# Patient Record
Sex: Female | Born: 1950 | Race: White | Hispanic: No | Marital: Married | Smoking: Never smoker
Health system: Southern US, Community
[De-identification: ages and names within clinical notes are randomized; demographics above are authoritative.]

## PROBLEM LIST (undated history)

## (undated) DIAGNOSIS — K5790 Diverticulosis of intestine, part unspecified, without perforation or abscess without bleeding: Secondary | ICD-10-CM

## (undated) DIAGNOSIS — Z809 Family history of malignant neoplasm, unspecified: Secondary | ICD-10-CM

## (undated) DIAGNOSIS — M8580 Other specified disorders of bone density and structure, unspecified site: Secondary | ICD-10-CM

## (undated) DIAGNOSIS — K862 Cyst of pancreas: Secondary | ICD-10-CM

## (undated) DIAGNOSIS — E119 Type 2 diabetes mellitus without complications: Secondary | ICD-10-CM

## (undated) DIAGNOSIS — I10 Essential (primary) hypertension: Secondary | ICD-10-CM

## (undated) DIAGNOSIS — C569 Malignant neoplasm of unspecified ovary: Secondary | ICD-10-CM

## (undated) HISTORY — DX: Family history of malignant neoplasm, unspecified: Z80.9

## (undated) HISTORY — DX: Diverticulosis of intestine, part unspecified, without perforation or abscess without bleeding: K57.90

## (undated) HISTORY — DX: Cyst of pancreas: K86.2

## (undated) HISTORY — DX: Other specified disorders of bone density and structure, unspecified site: M85.80

## (undated) HISTORY — DX: Malignant neoplasm of unspecified ovary: C56.9

---

## 2001-08-07 ENCOUNTER — Ambulatory Visit (HOSPITAL_COMMUNITY): Admission: RE | Admit: 2001-08-07 | Discharge: 2001-08-07 | Payer: Self-pay | Admitting: Unknown Physician Specialty

## 2001-08-07 ENCOUNTER — Encounter: Payer: Self-pay | Admitting: Unknown Physician Specialty

## 2002-08-13 ENCOUNTER — Ambulatory Visit (HOSPITAL_COMMUNITY): Admission: RE | Admit: 2002-08-13 | Discharge: 2002-08-13 | Payer: Self-pay | Admitting: Internal Medicine

## 2002-08-13 ENCOUNTER — Encounter: Payer: Self-pay | Admitting: Internal Medicine

## 2003-05-04 ENCOUNTER — Ambulatory Visit (HOSPITAL_COMMUNITY): Admission: RE | Admit: 2003-05-04 | Discharge: 2003-05-04 | Payer: Self-pay | Admitting: Internal Medicine

## 2003-07-07 ENCOUNTER — Emergency Department (HOSPITAL_COMMUNITY): Admission: EM | Admit: 2003-07-07 | Discharge: 2003-07-07 | Payer: Self-pay | Admitting: Emergency Medicine

## 2003-08-17 ENCOUNTER — Ambulatory Visit (HOSPITAL_COMMUNITY): Admission: RE | Admit: 2003-08-17 | Discharge: 2003-08-17 | Payer: Self-pay | Admitting: Internal Medicine

## 2003-09-25 ENCOUNTER — Ambulatory Visit (HOSPITAL_COMMUNITY): Admission: RE | Admit: 2003-09-25 | Discharge: 2003-09-25 | Payer: Self-pay | Admitting: Internal Medicine

## 2004-08-19 ENCOUNTER — Ambulatory Visit (HOSPITAL_COMMUNITY): Admission: RE | Admit: 2004-08-19 | Discharge: 2004-08-19 | Payer: Self-pay | Admitting: Unknown Physician Specialty

## 2005-08-21 ENCOUNTER — Ambulatory Visit (HOSPITAL_COMMUNITY): Admission: RE | Admit: 2005-08-21 | Discharge: 2005-08-21 | Payer: Self-pay | Admitting: Internal Medicine

## 2005-09-13 ENCOUNTER — Ambulatory Visit (HOSPITAL_COMMUNITY): Admission: RE | Admit: 2005-09-13 | Discharge: 2005-09-13 | Payer: Self-pay | Admitting: Internal Medicine

## 2006-02-20 ENCOUNTER — Ambulatory Visit (HOSPITAL_COMMUNITY): Admission: RE | Admit: 2006-02-20 | Discharge: 2006-02-20 | Payer: Self-pay | Admitting: Family Medicine

## 2006-09-28 ENCOUNTER — Ambulatory Visit (HOSPITAL_COMMUNITY): Admission: RE | Admit: 2006-09-28 | Discharge: 2006-09-28 | Payer: Self-pay | Admitting: Unknown Physician Specialty

## 2007-10-01 ENCOUNTER — Ambulatory Visit (HOSPITAL_COMMUNITY): Admission: RE | Admit: 2007-10-01 | Discharge: 2007-10-01 | Payer: Self-pay | Admitting: Unknown Physician Specialty

## 2008-10-02 ENCOUNTER — Ambulatory Visit (HOSPITAL_COMMUNITY): Admission: RE | Admit: 2008-10-02 | Discharge: 2008-10-02 | Payer: Self-pay | Admitting: Internal Medicine

## 2009-04-24 HISTORY — PX: VAGINAL HYSTERECTOMY: SHX2639

## 2009-10-04 ENCOUNTER — Ambulatory Visit (HOSPITAL_COMMUNITY): Admission: RE | Admit: 2009-10-04 | Discharge: 2009-10-04 | Payer: Self-pay | Admitting: Unknown Physician Specialty

## 2010-04-13 ENCOUNTER — Emergency Department (HOSPITAL_COMMUNITY)
Admission: EM | Admit: 2010-04-13 | Discharge: 2010-04-13 | Payer: Self-pay | Source: Home / Self Care | Admitting: Emergency Medicine

## 2010-07-04 LAB — BASIC METABOLIC PANEL
BUN: 14 mg/dL (ref 6–23)
CO2: 28 mEq/L (ref 19–32)
Chloride: 104 mEq/L (ref 96–112)
Creatinine, Ser: 0.96 mg/dL (ref 0.4–1.2)
GFR calc Af Amer: >60 mL/min (ref >60)
Glucose, Bld: 158 mg/dL — ABNORMAL HIGH (ref 70–99)
Potassium: 4 mEq/L (ref 3.5–5.1)

## 2010-09-09 NOTE — Consult Note (Signed)
NAME:  Sabrina Vang, Sabrina Vang                           ACCOUNT NO.:  [id number]   MEDICAL RECORD NO.:  [id number]                   PATIENT TYPE:   LOCATION:                                       FACILITY:  [facility]   PHYSICIAN:  Lionel December, M.D.                 DATE OF BIRTH:  1950-09-25   DATE OF CONSULTATION:  03/23/2003  DATE OF DISCHARGE:                                   CONSULTATION   REFERRING PHYSICIAN:  Dr. Ruthy Dick.   PRESENTING COMPLAINT:  Change in her bowel habits.   HISTORY OF PRESENT ILLNESS:  Sabrina Vang is a 60 year old Caucasian female who is  referred through the courtesy of Dr. Mora Appl for a possible colonoscopy.  She  was recently seen by Dr. Mora Appl for a gynecological followup and the patient  complained of having noted a change in her bowel habits.  She noted instead  of having one to two bowel movements a day, which was normal, she was having  dry hard stools.  This occurred soon after she started taking calcium.  On  stopping calcium, she has noted improvement, although she is not back to her  baseline.  Given her age, Dr. Mora Appl felt that she should have colonoscopy.  She denies abdominal pain, melena or rectal bleeding.  She has a good  appetite and has not lost any weight recently.  Her hemoglobin and  hematocrit two months ago were within normal limits.  A review of systems is  negative for heartburn, dysphagia, nausea or vomiting.   CURRENT MEDICATIONS:  She is presently on:  1. Atenolol 25 mg daily.  2. Triamterene/hydrochlorothiazide 37.5/25 mg daily.  3. MVI daily.   PAST MEDICAL HISTORY:  She has been hypertensive for about two years.  She  has a history of alimentary hypoglycemia treated with dietary measures.   PAST SURGICAL HISTORY:  She has never had any surgeries.   FAMILY HISTORY:  Father died of myocardial infarction at age 83.  She has  three sisters; one has diabetes and fibromyalgia; the other two sisters and  brother are in good health.   SOCIAL HISTORY:  She is married.  She has two grownup children.  They are  both in good health but their biological father has been diagnosed with  Huntington's chorea.  She is working at the [employer] of [place].  She has never smoked cigarettes and drinks alcohol socially but not  every day.   PHYSICAL EXAM:  GENERAL:  A pleasant, well-developed, well-nourished  Caucasian female who is in no acute distress.  She weighs 176 pounds.  She  is 5 feet 4-1/2 inches tall.  VITAL SIGNS:  Pulse 74 per minute, blood pressure 120/80, temperature is  97.3.  HEENT:  Conjunctivae are pink.  Sclerae are nonicteric.  Oropharyngeal  mucosa is normal.  NECK:  Neck without masses or thyromegaly.  CARDIAC:  Regular rhythm.  Normal S1 and S3.  No murmur or gallop noted.  LUNGS:  Lungs are clear to auscultation.  ABDOMEN:  Abdomen is full.  Bowel sounds are normal.  Palpation reveals a  soft abdomen without tenderness, organomegaly or masses.  RECTAL:  Examination deferred.  EXTREMITIES:  No clubbing or edema noted.   ASSESSMENT:  Sabrina Vang is a 60 year old Caucasian female who recently developed  constipation felt to be secondary to use of calcium.  She has improved off  calcium.  She does not have any worrisome or alarm symptoms, but given her  age, colonoscopy would be appropriate, primarily for screening purposes.  Once her colon exam is determined to be normal, we could recommend high-  fiber diet and Colace, along with her calcium and go from there.   RECOMMENDATIONS:  Total colonoscopy to be performed at [facility] in the near  future.  I have reviewed the procedure and risks with [name] and she is  agreeable.   I would like to thank Dr. Mora Appl for the opportunity to participate in the  care of this nice lady.      ___________________________________________                                            Lionel December, M.D.   NR/MEDQ  D:  03/23/2003  T:  03/24/2003  Job:  [id number]    cc:   Ruthy Dick  [address]  [place]  [place] [address]  Fax: [phone]   Madelin Rear. Sherwood Gambler, M.D.  [address]  [place]  [place] [address]  Fax: [phone]   [facility]

## 2010-09-09 NOTE — Op Note (Signed)
NAME:  Sabrina Vang, Sabrina Vang                            ACCOUNT NO.:  [id number]   MEDICAL RECORD NO.:  [id number]                   PATIENT TYPE:  AMB   LOCATION:  DAY                                  FACILITY:  [facility]   PHYSICIAN:  Lionel December, M.D.                 DATE OF BIRTH:  June 29, 1950   DATE OF PROCEDURE:  05/04/2003  DATE OF DISCHARGE:                                 OPERATIVE REPORT   PROCEDURE:  Total colonoscopy.   INDICATIONS FOR PROCEDURE:  Sabrina Vang is a 60 year old Caucasian female who has  noted changes in her bowel habits recently, possibly due to calcium use.  She is undergoing diagnostic colonoscopy.  Family history is negative for  colorectal carcinoma.  The procedure and risks were reviewed with the  patient, and informed consent was obtained.   PREOPERATIVE MEDICATIONS:  Demerol 25 mg IV, Versed 6 mg IV in divided dose.   FINDINGS:  The procedure was performed in the endoscopy suite.  The  patient's vital signs and O2 saturations were monitored during the procedure  and remained stable.  The patient was placed in the left lateral recumbent  position and rectal examination performed.  No abnormality noted on external  or digital exam.  The Olympus videoscope was placed into the rectum and  advanced into the region of the sigmoid colon which was spastic with  scattered diverticula.  Once the scope was passed through this segment,  further intubation to the cecum was easy.  The cecum was identified by the  ileocecal valve and appendiceal orifice.  There was an erosion at the  ileocecal valve felt to be a nonspecific finding.  As the scope was  withdrawn, the colonic mucosa was carefully examined.  There was a small  polyp at the ascending colon which was ablated by cold biopsy.  The rest of  the colon was normal.  The rectal mucosa was normal.  The scope was  retroflexed to examine the anorectal junction which was unremarkable.  The  endoscope was straightened and withdrawn.   The patient tolerated the  procedure well.   FINAL DIAGNOSES:  1. Examination performed to the cecum.  2. Sigmoid colon diverticulosis.  3. Small polyp ablated by cold biopsy from the ascending colon.   RECOMMENDATIONS:  1. High fiber diet.  2. Citrucel or equivalent, one tablespoon full daily.  3. I will be contacting the patient with the biopsy results and further     recommendations if any.      ___________________________________________                                            Lionel December, M.D.   NR/MEDQ  D:  05/04/2003  T:  05/04/2003  Job:  [id number]   cc:  Ruthy Dick  [address]  [place]  [place] [address]  Fax: [phone]   Madelin Rear. Sherwood Gambler, M.D.  [address]  [place]  [place] [address]  Fax: [phone]

## 2010-09-26 ENCOUNTER — Other Ambulatory Visit (HOSPITAL_COMMUNITY): Payer: Self-pay | Admitting: *Deleted

## 2010-09-26 ENCOUNTER — Other Ambulatory Visit (HOSPITAL_COMMUNITY): Payer: Self-pay | Admitting: Unknown Physician Specialty

## 2010-09-26 DIAGNOSIS — Z139 Encounter for screening, unspecified: Secondary | ICD-10-CM

## 2010-10-11 ENCOUNTER — Ambulatory Visit (HOSPITAL_COMMUNITY)
Admission: RE | Admit: 2010-10-11 | Discharge: 2010-10-11 | Disposition: A | Discharge status: Home / Self Care | Payer: 59 | Source: Ambulatory Visit | Attending: Internal Medicine | Admitting: Internal Medicine

## 2010-10-11 DIAGNOSIS — Z139 Encounter for screening, unspecified: Secondary | ICD-10-CM

## 2010-10-11 DIAGNOSIS — Z1231 Encounter for screening mammogram for malignant neoplasm of breast: Secondary | ICD-10-CM | POA: Insufficient documentation

## 2011-09-06 ENCOUNTER — Other Ambulatory Visit (HOSPITAL_COMMUNITY): Payer: Self-pay | Admitting: Internal Medicine

## 2011-09-06 DIAGNOSIS — Z139 Encounter for screening, unspecified: Secondary | ICD-10-CM

## 2011-10-12 ENCOUNTER — Ambulatory Visit (HOSPITAL_COMMUNITY)
Admission: RE | Admit: 2011-10-12 | Discharge: 2011-10-12 | Disposition: A | Discharge status: Home / Self Care | Payer: BC Managed Care – PPO | Source: Ambulatory Visit | Attending: Internal Medicine | Admitting: Internal Medicine

## 2011-10-12 DIAGNOSIS — Z139 Encounter for screening, unspecified: Secondary | ICD-10-CM

## 2011-10-12 DIAGNOSIS — Z1231 Encounter for screening mammogram for malignant neoplasm of breast: Secondary | ICD-10-CM | POA: Insufficient documentation

## 2012-09-02 ENCOUNTER — Other Ambulatory Visit (HOSPITAL_COMMUNITY): Payer: Self-pay | Admitting: Internal Medicine

## 2012-09-02 DIAGNOSIS — Z139 Encounter for screening, unspecified: Secondary | ICD-10-CM

## 2012-10-14 ENCOUNTER — Ambulatory Visit (HOSPITAL_COMMUNITY)
Admission: RE | Admit: 2012-10-14 | Discharge: 2012-10-14 | Disposition: A | Discharge status: Home / Self Care | Payer: BC Managed Care – PPO | Source: Ambulatory Visit | Attending: Internal Medicine | Admitting: Internal Medicine

## 2012-10-14 DIAGNOSIS — Z139 Encounter for screening, unspecified: Secondary | ICD-10-CM

## 2012-10-14 DIAGNOSIS — Z1231 Encounter for screening mammogram for malignant neoplasm of breast: Secondary | ICD-10-CM | POA: Insufficient documentation

## 2013-04-24 DIAGNOSIS — E119 Type 2 diabetes mellitus without complications: Secondary | ICD-10-CM

## 2013-04-24 HISTORY — DX: Type 2 diabetes mellitus without complications: E11.9

## 2013-04-29 ENCOUNTER — Encounter (INDEPENDENT_AMBULATORY_CARE_PROVIDER_SITE_OTHER): Payer: Self-pay | Admitting: *Deleted

## 2013-05-21 ENCOUNTER — Other Ambulatory Visit (INDEPENDENT_AMBULATORY_CARE_PROVIDER_SITE_OTHER): Payer: Self-pay | Admitting: *Deleted

## 2013-05-21 DIAGNOSIS — Z1211 Encounter for screening for malignant neoplasm of colon: Secondary | ICD-10-CM

## 2013-05-22 ENCOUNTER — Telehealth (INDEPENDENT_AMBULATORY_CARE_PROVIDER_SITE_OTHER): Payer: Self-pay | Admitting: *Deleted

## 2013-05-22 DIAGNOSIS — Z1211 Encounter for screening for malignant neoplasm of colon: Secondary | ICD-10-CM

## 2013-05-22 MED ORDER — PEG-KCL-NACL-NASULF-NA ASC-C 100 G PO SOLR: 1.0000 | Freq: Once | ORAL | Status: DC

## 2013-05-22 NOTE — Telephone Encounter (Signed)
Patient needs movi prep 

## 2013-06-04 ENCOUNTER — Telehealth (INDEPENDENT_AMBULATORY_CARE_PROVIDER_SITE_OTHER): Payer: Self-pay | Admitting: *Deleted

## 2013-06-04 NOTE — Telephone Encounter (Signed)
  Procedure: tcs  Reason/Indication:  screening  Has patient had this procedure before?  Yes, 2005 -- epic  If so, when, by whom and where?    Is there a family history of colon cancer?  no  Who?  What age when diagnosed?    Is patient diabetic?   yes      Does patient have prosthetic heart valve?  no  Do you have a pacemaker?  no  Has patient ever had endocarditis? no  Has patient had joint replacement within last 12 months?  no  Does patient tend to be constipated or take laxatives? no  Is patient on Coumadin, Plavix and/or Aspirin? yes  Medications: asa 81 mg daily, metformin 500 mg bid (am & pm), atenolol 25 mg daily, mutli vit  Allergies: nkda  Medication Adjustment: asa 2 days, hold metformin evening before and morning of  Procedure date & time: 06/26/13 at 1030

## 2013-06-05 NOTE — Telephone Encounter (Signed)
agree

## 2013-06-09 ENCOUNTER — Encounter (HOSPITAL_COMMUNITY): Payer: Self-pay | Admitting: Pharmacy Technician

## 2013-06-26 ENCOUNTER — Encounter (HOSPITAL_COMMUNITY)
Admission: RE | Disposition: A | Discharge status: Home / Self Care | Payer: Self-pay | Source: Ambulatory Visit | Attending: Internal Medicine

## 2013-06-26 ENCOUNTER — Ambulatory Visit (HOSPITAL_COMMUNITY)
Admission: RE | Admit: 2013-06-26 | Discharge: 2013-06-26 | Disposition: A | Discharge status: Home / Self Care | Payer: BC Managed Care – PPO | Source: Ambulatory Visit | Attending: Internal Medicine | Admitting: Internal Medicine

## 2013-06-26 ENCOUNTER — Encounter (HOSPITAL_COMMUNITY): Payer: Self-pay | Admitting: *Deleted

## 2013-06-26 DIAGNOSIS — Z7982 Long term (current) use of aspirin: Secondary | ICD-10-CM | POA: Insufficient documentation

## 2013-06-26 DIAGNOSIS — K5730 Diverticulosis of large intestine without perforation or abscess without bleeding: Secondary | ICD-10-CM | POA: Insufficient documentation

## 2013-06-26 DIAGNOSIS — E119 Type 2 diabetes mellitus without complications: Secondary | ICD-10-CM | POA: Insufficient documentation

## 2013-06-26 DIAGNOSIS — I10 Essential (primary) hypertension: Secondary | ICD-10-CM | POA: Insufficient documentation

## 2013-06-26 DIAGNOSIS — Z1211 Encounter for screening for malignant neoplasm of colon: Secondary | ICD-10-CM

## 2013-06-26 HISTORY — DX: Type 2 diabetes mellitus without complications: E11.9

## 2013-06-26 HISTORY — PX: COLONOSCOPY: SHX5424

## 2013-06-26 HISTORY — DX: Essential (primary) hypertension: I10

## 2013-06-26 LAB — GLUCOSE, CAPILLARY: Glucose-Capillary: 121 mg/dL — ABNORMAL HIGH (ref 70–99)

## 2013-06-26 SURGERY — COLONOSCOPY
Anesthesia: Moderate Sedation

## 2013-06-26 MED ORDER — STERILE WATER FOR IRRIGATION IR SOLN
Status: DC | PRN
  Administered 2013-06-26: 11:00:00

## 2013-06-26 MED ORDER — MIDAZOLAM HCL 5 MG/5ML IJ SOLN
INTRAMUSCULAR | Status: DC | PRN
  Administered 2013-06-26 (×3): 2 mg via INTRAVENOUS

## 2013-06-26 MED ORDER — MEPERIDINE HCL 50 MG/ML IJ SOLN
INTRAMUSCULAR | Status: AC
  Filled 2013-06-26: qty 1

## 2013-06-26 MED ORDER — SODIUM CHLORIDE 0.9 % IV SOLN
INTRAVENOUS | Status: DC
  Administered 2013-06-26: 10:00:00 via INTRAVENOUS

## 2013-06-26 MED ORDER — MIDAZOLAM HCL 5 MG/5ML IJ SOLN
INTRAMUSCULAR | Status: AC
  Filled 2013-06-26: qty 10

## 2013-06-26 MED ORDER — MEPERIDINE HCL 50 MG/ML IJ SOLN
INTRAMUSCULAR | Status: DC | PRN
  Administered 2013-06-26 (×2): 25 mg via INTRAVENOUS

## 2013-06-26 NOTE — Discharge Instructions (Signed)
Resume usual medications and high fiber diet. No driving for 24 hours. Next screening exam in 10 years.     Diverticulosis Diverticulosis is a common condition that develops when small pouches (diverticula) form in the wall of the colon. The risk of diverticulosis increases with age. It happens more often in people who eat a low-fiber diet. Most individuals with diverticulosis have no symptoms. Those individuals with symptoms usually experience abdominal pain, constipation, or loose stools (diarrhea). HOME CARE INSTRUCTIONS   Increase the amount of fiber in your diet as directed by your caregiver or dietician. This may reduce symptoms of diverticulosis.  Your caregiver may recommend taking a dietary fiber supplement.  Drink at least 6 to 8 glasses of water each day to prevent constipation.  Try not to strain when you have a bowel movement.  Your caregiver may recommend avoiding nuts and seeds to prevent complications, although this is still an uncertain benefit.  Only take over-the-counter or prescription medicines for pain, discomfort, or fever as directed by your caregiver. FOODS WITH HIGH FIBER CONTENT INCLUDE:  Fruits. Apple, peach, pear, tangerine, raisins, prunes.  Vegetables. Brussels sprouts, asparagus, broccoli, cabbage, carrot, cauliflower, romaine lettuce, spinach, summer squash, tomato, winter squash, zucchini.  Starchy Vegetables. Baked beans, kidney beans, lima beans, split peas, lentils, potatoes (with skin).  Grains. Whole wheat bread, brown rice, bran flake cereal, plain oatmeal, white rice, shredded wheat, bran muffins. SEEK IMMEDIATE MEDICAL CARE IF:   You develop increasing pain or severe bloating.  You have an oral temperature above 102 F (38.9 C), not controlled by medicine.  You develop vomiting or bowel movements that are bloody or black. Document Released: 01/06/2004 Document Revised: 07/03/2011 Document Reviewed: 09/08/2009 [facility] Patient  Information 2014 [place].

## 2013-06-26 NOTE — H&P (Signed)
Sabrina Vang is an 63 y.o. female.   Chief Complaint: Patient is  here for colonoscopy. HPI: Patient is 22-year-old Caucasian female who is here for screening colonoscopy. She denies abdominal pain change in bowel habits or rectal bleeding. Patient's last colonoscopy was 10 years ago. Family history is negative for CRC.  Past Medical History  Diagnosis Date  . Diabetes mellitus without complication   . Hypertension     Past Surgical History  Procedure Laterality Date  . Abdominal hysterectomy      Family History  Problem Relation Age of Onset  . Colon cancer Neg Hx    Social History:  reports that she has never smoked. She does not have any smokeless tobacco history on file. She reports that she drinks about 2.5 ounces of alcohol per week. She reports that she does not use illicit drugs.  Allergies: No Known Allergies  Medications Prior to Admission  Medication Sig Dispense Refill  . aspirin EC 81 MG tablet Take 81 mg by mouth daily.      Marland Kitchen atenolol (TENORMIN) 25 MG tablet Take 25 mg by mouth daily.      . metFORMIN (GLUCOPHAGE) 500 MG tablet Take 500 mg by mouth 2 (two) times daily with a meal.      . Multiple Vitamin (MULTIVITAMIN WITH MINERALS) TABS tablet Take 1 tablet by mouth daily.      . naproxen sodium (ALEVE) 220 MG tablet Take 220 mg by mouth daily as needed (for pain).      . peg 3350 powder (MOVIPREP) 100 G SOLR Take 1 kit (200 g total) by mouth once.  1 kit  0    Results for orders placed during the hospital encounter of 06/26/13 (from the past 48 hour(s))  GLUCOSE, CAPILLARY     Status: Abnormal   Collection Time    06/26/13 10:01 AM      Result Value Ref Range   Glucose-Capillary 121 (*) 70 - 99 mg/dL   No results found.  ROS  Blood pressure 138/78, pulse 84, temperature 98 F (36.7 C), temperature source Oral, height 5' 5"  (1.651 m), weight 155 lb (70.308 kg), SpO2 95.00%. Physical Exam  Constitutional: She appears well-developed and well-nourished.   HENT:  Mouth/Throat: Oropharynx is clear and moist.  Eyes: Conjunctivae are normal. No scleral icterus.  Neck: No thyromegaly present.  Cardiovascular: Normal rate, regular rhythm and normal heart sounds.   No murmur heard. GI: Soft. She exhibits no distension and no mass. There is no tenderness.  Musculoskeletal: She exhibits no edema.  Lymphadenopathy:    She has no cervical adenopathy.  Neurological: She is alert.  Skin: Skin is warm and dry.     Assessment/Plan Average risk screening colonoscopy.  REHMAN,NAJEEB U 06/26/2013, 10:46 AM

## 2013-06-26 NOTE — Op Note (Signed)
COLONOSCOPY PROCEDURE REPORT  PATIENT:  Sabrina Vang  MR#:  [id number] Birthdate:  Oct 05, 1950, 64 y.o., female Endoscopist:  Dr. Rogene Houston, MD Referred By:  Dr. Sherrilee Gilles. Gerarda Fraction, MD Procedure Date: 06/26/2013  Procedure:   Colonoscopy  Indications:  Patient is 63 year old Caucasian female is undergoing average risk screening colonoscopy.  Informed Consent:  The procedure and risks were reviewed with the patient and informed consent was obtained.  Medications:  Demerol 50 mg IV Versed 6 mg IV  Description of procedure:  After a digital rectal exam was performed, that colonoscope was advanced from the anus through the rectum and colon to the area of the cecum, ileocecal valve and appendiceal orifice. The cecum was deeply intubated. These structures were well-seen and photographed for the record. From the level of the cecum and ileocecal valve, the scope was slowly and cautiously withdrawn. The mucosal surfaces were carefully surveyed utilizing scope tip to flexion to facilitate fold flattening as needed. The scope was pulled down into the rectum where a thorough exam including retroflexion was performed.  Findings:  Prep satisfactory. Two small diverticula noted at hepatic flexure. Scattered diverticula at sigmoid colon. Normal mucosa of rectum and anorectal junction.   Therapeutic/Diagnostic Maneuvers Performed:  None  Complications:  None  Cecal Withdrawal Time:  9 minutes  Impression:  Examination performed to cecum. Mild sigmoid colon diverticulosis along with two diverticula at hepatic flexure.   Recommendations:  Standard instructions given. High-fiber diet. Next screening exam in 10 years.  Jaeceon Michelin U  06/26/2013 11:19 AM  CC: Dr. Glo Herring., MD & Dr. Rayne Du ref. provider found

## 2013-06-27 ENCOUNTER — Encounter (HOSPITAL_COMMUNITY): Payer: Self-pay | Admitting: Internal Medicine

## 2013-09-10 ENCOUNTER — Other Ambulatory Visit (HOSPITAL_COMMUNITY): Payer: Self-pay | Admitting: Internal Medicine

## 2013-09-10 DIAGNOSIS — Z1231 Encounter for screening mammogram for malignant neoplasm of breast: Secondary | ICD-10-CM

## 2013-10-16 ENCOUNTER — Ambulatory Visit (HOSPITAL_COMMUNITY)
Admission: RE | Admit: 2013-10-16 | Discharge: 2013-10-16 | Disposition: A | Discharge status: Home / Self Care | Payer: BC Managed Care – PPO | Source: Ambulatory Visit | Attending: Internal Medicine | Admitting: Internal Medicine

## 2013-10-16 DIAGNOSIS — Z1231 Encounter for screening mammogram for malignant neoplasm of breast: Secondary | ICD-10-CM

## 2014-06-03 ENCOUNTER — Encounter (INDEPENDENT_AMBULATORY_CARE_PROVIDER_SITE_OTHER): Payer: Self-pay | Admitting: Internal Medicine

## 2014-06-03 ENCOUNTER — Encounter (HOSPITAL_COMMUNITY): Payer: Self-pay | Admitting: Emergency Medicine

## 2014-06-03 ENCOUNTER — Encounter (INDEPENDENT_AMBULATORY_CARE_PROVIDER_SITE_OTHER): Payer: Self-pay | Admitting: *Deleted

## 2014-06-03 ENCOUNTER — Emergency Department (HOSPITAL_COMMUNITY): Payer: BLUE CROSS/BLUE SHIELD

## 2014-06-03 ENCOUNTER — Ambulatory Visit (INDEPENDENT_AMBULATORY_CARE_PROVIDER_SITE_OTHER): Payer: BLUE CROSS/BLUE SHIELD | Admitting: Internal Medicine

## 2014-06-03 ENCOUNTER — Emergency Department (HOSPITAL_COMMUNITY)
Admission: EM | Admit: 2014-06-03 | Discharge: 2014-06-03 | Disposition: A | Discharge status: Home / Self Care | Payer: BLUE CROSS/BLUE SHIELD | Attending: Emergency Medicine | Admitting: Emergency Medicine

## 2014-06-03 VITALS — BP 102/58 | HR 84 | Temp 98.3°F | Ht 65.0 in | Wt 159.4 lb

## 2014-06-03 DIAGNOSIS — E119 Type 2 diabetes mellitus without complications: Secondary | ICD-10-CM | POA: Insufficient documentation

## 2014-06-03 DIAGNOSIS — Z791 Long term (current) use of non-steroidal anti-inflammatories (NSAID): Secondary | ICD-10-CM | POA: Insufficient documentation

## 2014-06-03 DIAGNOSIS — R51 Headache: Secondary | ICD-10-CM | POA: Diagnosis not present

## 2014-06-03 DIAGNOSIS — R17 Unspecified jaundice: Secondary | ICD-10-CM | POA: Diagnosis not present

## 2014-06-03 DIAGNOSIS — R21 Rash and other nonspecific skin eruption: Secondary | ICD-10-CM | POA: Insufficient documentation

## 2014-06-03 DIAGNOSIS — R319 Hematuria, unspecified: Secondary | ICD-10-CM | POA: Diagnosis present

## 2014-06-03 DIAGNOSIS — R1013 Epigastric pain: Secondary | ICD-10-CM

## 2014-06-03 DIAGNOSIS — I10 Essential (primary) hypertension: Secondary | ICD-10-CM | POA: Diagnosis not present

## 2014-06-03 DIAGNOSIS — Z79899 Other long term (current) drug therapy: Secondary | ICD-10-CM | POA: Insufficient documentation

## 2014-06-03 DIAGNOSIS — M791 Myalgia: Secondary | ICD-10-CM | POA: Diagnosis not present

## 2014-06-03 DIAGNOSIS — Z7982 Long term (current) use of aspirin: Secondary | ICD-10-CM | POA: Diagnosis not present

## 2014-06-03 LAB — URINALYSIS, ROUTINE W REFLEX MICROSCOPIC
GLUCOSE, UA: NEGATIVE mg/dL
Nitrite: NEGATIVE
Specific Gravity, Urine: 1.025 (ref 1.005–1.030)
Urobilinogen, UA: 0.2 mg/dL (ref 0.0–1.0)
pH: 6 (ref 5.0–8.0)

## 2014-06-03 LAB — CBC WITH DIFFERENTIAL/PLATELET
BASOS PCT: 1 % (ref 0–1)
Basophils Absolute: 0.1 10*3/uL (ref 0.0–0.1)
Eosinophils Absolute: 0.9 10*3/uL — ABNORMAL HIGH (ref 0.0–0.7)
Eosinophils Relative: 10 % — ABNORMAL HIGH (ref 0–5)
HCT: 41.7 % (ref 36.0–46.0)
HEMOGLOBIN: 13.6 g/dL (ref 12.0–15.0)
LYMPHS ABS: 1.4 10*3/uL (ref 0.7–4.0)
Lymphocytes Relative: 17 % (ref 12–46)
MCH: 31 pg (ref 26.0–34.0)
MCHC: 32.6 g/dL (ref 30.0–36.0)
MCV: 95 fL (ref 78.0–100.0)
MONOS PCT: 8 % (ref 3–12)
Monocytes Absolute: 0.7 10*3/uL (ref 0.1–1.0)
NEUTROS ABS: 5.2 10*3/uL (ref 1.7–7.7)
Neutrophils Relative %: 64 % (ref 43–77)
Platelets: 148 10*3/uL — ABNORMAL LOW (ref 150–400)
RBC: 4.39 MIL/uL (ref 3.87–5.11)
RDW: 13.4 % (ref 11.5–15.5)
WBC: 8.2 10*3/uL (ref 4.0–10.5)

## 2014-06-03 LAB — COMPREHENSIVE METABOLIC PANEL
ALBUMIN: 3.6 g/dL (ref 3.5–5.2)
ALT: 304 U/L — ABNORMAL HIGH (ref 0–35)
AST: 166 U/L — ABNORMAL HIGH (ref 0–37)
Alkaline Phosphatase: 90 U/L (ref 39–117)
Anion gap: 7 (ref 5–15)
BUN: 11 mg/dL (ref 6–23)
CALCIUM: 8.9 mg/dL (ref 8.4–10.5)
CO2: 24 mmol/L (ref 19–32)
CREATININE: 0.75 mg/dL (ref 0.50–1.10)
Chloride: 109 mmol/L (ref 96–112)
GFR calc non Af Amer: 88 mL/min — ABNORMAL LOW (ref >90)
Glucose, Bld: 133 mg/dL — ABNORMAL HIGH (ref 70–99)
Potassium: 3.6 mmol/L (ref 3.5–5.1)
Sodium: 140 mmol/L (ref 135–145)
Total Bilirubin: 2.1 mg/dL — ABNORMAL HIGH (ref 0.3–1.2)
Total Protein: 7.5 g/dL (ref 6.0–8.3)

## 2014-06-03 LAB — TROPONIN I: Troponin I: <0.03 ng/mL (ref <0.031)

## 2014-06-03 LAB — URINE MICROSCOPIC-ADD ON

## 2014-06-03 LAB — LIPASE, BLOOD: LIPASE: 22 U/L (ref 11–59)

## 2014-06-03 MED ORDER — HYDROCODONE-ACETAMINOPHEN 5-325 MG PO TABS: 1.0000 | ORAL_TABLET | Freq: Four times a day (QID) | ORAL | Status: DC | PRN

## 2014-06-03 NOTE — ED Notes (Signed)
MD at the bedside to discuss plan with pt

## 2014-06-03 NOTE — Discharge Instructions (Signed)
Jaundice  Jaundice is when the skin, whites of the eyes, and mucous membranes turn a yellowish color. It is caused by high levels of bilirubin in the blood. Bilirubin is produced by the normal breakdown of red blood cells. Jaundice may mean the liver or bile system in your body is not working right. HOME CARE  Rest.  Drink enough fluids to keep your pee (urine) clear or pale yellow.  Do not drink alcohol.  Only take medicine as told by your doctor.  If you have jaundice because of viral hepatitis or an infection:  Avoid close contact with people.  Avoid making food for others.  Avoid sharing eating utensils with others.  Wash your hands often.  Keep all follow-up visits with your doctor.  Use skin lotion to help with itching. GET HELP RIGHT AWAY IF:  You have more pain.  You keep throwing up (vomiting).  You lose too much body fluid (dehydration).  You have a fever or persistent symptoms for more than 72 hours.  You have a fever and your symptoms suddenly get worse.  You become weak or confused.  You develop a severe headache. MAKE SURE YOU:  Understand these instructions.  Will watch your condition.  Will get help right away if you are not doing well or get worse. Document Released: 05/13/2010 Document Revised: 07/03/2011 Document Reviewed: 05/13/2010 [facility] Patient Information 2015 [place], [place]. This information is not intended to replace advice given to you by your health care provider. Make sure you discuss any questions you have with your health care provider.  Follow-up with GI medicine or your primary care doctor to have your liver function tests rechecked. May require hepatitis screen. In addition may have a dysfunctional gallbladder and may require hida scan. Do not feel that the liver function test directly reflect a gallbladder problem however. Return for any new or worse symptoms.

## 2014-06-03 NOTE — ED Provider Notes (Signed)
CSN: [id number]     Arrival date & time 06/03/14  0732 History  This chart was scribed for Fredia Sorrow, MD by Edison Simon, ED Scribe. This patient was seen in room APA11/APA11 and the patient's care was started at 7:57 AM.    Chief Complaint  Patient presents with  . Hematuria   Patient is a 64 y.o. female presenting with hematuria and abdominal pain. The history is provided by the patient. No language interpreter was used.  Hematuria This is a new problem. The current episode started yesterday. The problem occurs constantly. The problem has not changed since onset.Associated symptoms include abdominal pain and headaches. Pertinent negatives include no chest pain and no shortness of breath. Nothing aggravates the symptoms. Nothing relieves the symptoms. She has tried nothing for the symptoms.  Abdominal Pain Pain location:  Epigastric Pain radiation: laterally across ribs. Pain severity:  Moderate Onset quality:  Sudden Timing:  Intermittent Progression:  Resolved Chronicity:  New Relieved by:  Vomiting Worsened by:  Nothing tried Ineffective treatments:  None tried Associated symptoms: chills, hematuria and vomiting   Associated symptoms: no chest pain, no cough, no diarrhea, no dysuria, no fever, no nausea, no shortness of breath and no sore throat     HPI Comments: TAKARI DUNCOMBE is a 64 y.o. female with history of HTN and DM who presents to the Emergency Department complaining of hematuria with onset yesterday. She states her urine is a "deep orange color" and has a foul odor. She notes that 4 days ago, she had epigastric pain radiating across ribcage which resolved. The pain recurred the next day and was more persistent, so she called EMS because they suspected she was having a cardiac problem; EMS did 2 EKGs without significant finding. She reports subsequent projectile vomiting, after which pain resolved, and persistent shaking lasting into the night. She thought she was  hypoglycemic so she ate orange juice and peanut butter sandwich. She reports body aches and headache the day after as well as some urine discoloration. She also reports associated decreased appetite, that food tastes "weird," yellow color to her skin, stool that is light in color, and itchy red streaks to her neck and upper abdomen. She denies nausea, diarrhea, or fever.  PCP: Glo Herring., MD but has not seen in 5 years, has to go through process to establish care again Gynecologist refills he blood pressure medication  Past Medical History  Diagnosis Date  . Diabetes mellitus without complication   . Hypertension    Past Surgical History  Procedure Laterality Date  . Abdominal hysterectomy    . Colonoscopy N/A 06/26/2013    Procedure: COLONOSCOPY;  Surgeon: Rogene Houston, MD;  Location: [facility];  Service: Endoscopy;  Laterality: N/A;  1030   Family History  Problem Relation Age of Onset  . Colon cancer Neg Hx    History  Substance Use Topics  . Smoking status: Never Smoker   . Smokeless tobacco: Not on file  . Alcohol Use: 2.5 oz/week    5 Standard drinks or equivalent per week     Comment: occ   OB History    No data available     Review of Systems  Constitutional: Positive for chills and appetite change. Negative for fever.  HENT: Negative for rhinorrhea and sore throat.   Eyes: Negative for visual disturbance.  Respiratory: Negative for cough and shortness of breath.   Cardiovascular: Negative for chest pain and leg swelling.  Gastrointestinal: Positive for  vomiting and abdominal pain. Negative for nausea and diarrhea.  Genitourinary: Positive for hematuria. Negative for dysuria and frequency.  Musculoskeletal: Positive for myalgias. Negative for back pain.  Skin: Positive for color change and rash.  Neurological: Positive for headaches.  Hematological: Does not bruise/bleed easily.  Psychiatric/Behavioral: Negative for confusion.      Allergies   Review of patient's allergies indicates no known allergies.  Home Medications   Prior to Admission medications   Medication Sig Start Date End Date Taking? Authorizing Provider  aspirin EC 81 MG tablet Take 81 mg by mouth daily.   Yes Historical Provider, MD  atenolol (TENORMIN) 25 MG tablet Take 25 mg by mouth at bedtime.    Yes Historical Provider, MD  metFORMIN (GLUCOPHAGE) 1000 MG tablet Take 500 mg by mouth 2 (two) times daily. 05/21/14  Yes Historical Provider, MD  Multiple Vitamin (MULTIVITAMIN WITH MINERALS) TABS tablet Take 1 tablet by mouth daily.   Yes Historical Provider, MD  naproxen sodium (ALEVE) 220 MG tablet Take 220 mg by mouth daily as needed (for pain).   Yes Historical Provider, MD   BP 134/75 mmHg  Pulse 76  Temp(Src) 97.8 F (36.6 C) (Oral)  Resp 14  Ht 5\' 5"  (1.651 m)  Wt 159 lb (72.122 kg)  BMI 26.46 kg/m2  SpO2 99% Physical Exam  Constitutional: She is oriented to person, place, and time. She appears well-developed and well-nourished.  HENT:  Head: Normocephalic and atraumatic.  Mucous membranes moist  Eyes: Conjunctivae and EOM are normal. Pupils are equal, round, and reactive to light.  Neck: Normal range of motion. Neck supple.  Cardiovascular: Normal rate, regular rhythm and normal heart sounds.   Pulmonary/Chest: Effort normal and breath sounds normal. No respiratory distress. She has no wheezes. She has no rales.  Lungs clear bilaterally  Abdominal: Soft. Bowel sounds are normal. There is no tenderness.  Musculoskeletal: Normal range of motion. She exhibits no edema (no swelling in ankles).  Neurological: She is alert and oriented to person, place, and time. No cranial nerve deficit. She exhibits normal muscle tone. Coordination normal.  Skin: Skin is warm and dry.  Psychiatric: She has a normal mood and affect.  Nursing note and vitals reviewed.   ED Course  Procedures (including critical care time)  DIAGNOSTIC STUDIES: Oxygen Saturation is  97% on room air, normal by my interpretation.    COORDINATION OF CARE: 8:13 AM Discussed treatment plan with patient at beside, the patient agrees with the plan and has no further questions at this time.   Labs Review Labs Reviewed  URINALYSIS, ROUTINE W REFLEX MICROSCOPIC - Abnormal; Notable for the following:    Hgb urine dipstick TRACE (*)    Bilirubin Urine MODERATE (*)    Ketones, ur TRACE (*)    Protein, ur TRACE (*)    Leukocytes, UA SMALL (*)    All other components within normal limits  URINE MICROSCOPIC-ADD ON - Abnormal; Notable for the following:    Squamous Epithelial / LPF MANY (*)    Bacteria, UA FEW (*)    Crystals CA OXALATE CRYSTALS (*)    All other components within normal limits  COMPREHENSIVE METABOLIC PANEL - Abnormal; Notable for the following:    Glucose, Bld 133 (*)    AST 166 (*)    ALT 304 (*)    Total Bilirubin 2.1 (*)    GFR calc non Af Amer 88 (*)    All other components within normal limits  CBC WITH  DIFFERENTIAL/PLATELET - Abnormal; Notable for the following:    Platelets 148 (*)    Eosinophils Relative 10 (*)    Eosinophils Absolute 0.9 (*)    All other components within normal limits  URINE CULTURE  LIPASE, BLOOD  TROPONIN I    Imaging Review US Abdomen Complete  06/03/2014   CLINICAL DATA:  Epigastric abdominal pain.  EXAM: ULTRASOUND ABDOMEN COMPLETE  COMPARISON:  None.  FINDINGS: Gallbladder: Mobile sludge noted within the gallbladder. Gallbladder wall slightly thickened at 4 mm. Negative sonographic Murphy's.  Common bile duct: Diameter: Normal caliber, 3 mm.  Liver: Increased echotexture throughout the liver compatible with fatty infiltration. Hypoechoic area noted posteriorly, likely focal fatty sparing.  IVC: No abnormality visualized.  Pancreas: Visualized portion unremarkable.  Spleen: Size and appearance within normal limits.  Right Kidney: Length: 2.3 cm benign appearing cyst in the lower pole. Right kidney measures 10.5 cm.  Echogenicity within normal limits. No mass or hydronephrosis visualized.  Left Kidney: Length: 12.1 cm. Echogenicity within normal limits. No mass or hydronephrosis visualized.  Abdominal aorta: No aneurysm.  Other findings: None.  IMPRESSION: Sludge within the gallbladder. Gallbladder wall is slightly thickened which could be related to chronic cholecystitis. No visible stones.  Fatty infiltration of the liver.   Electronically Signed   By: Rolm Baptise M.D.   On: 06/03/2014 10:11     EKG Interpretation None      Date: 06/03/2014  Rate: 67  Rhythm: normal sinus rhythm  QRS Axis: normal  Intervals: normal  ST/T Wave abnormalities: normal  Conduction Disutrbances:none  Narrative Interpretation:   Old EKG Reviewed: none available      MDM   Final diagnoses:  Jaundice    Patient's ultrasound raises some concern for thickening of the gallbladder and sludge without gallstones. Could represent a dysfunctional gallbladder. This could perhaps explain the history of pain in the epigastric area occasionally. In addition liver function tests do show elevation in the enzymes, alkaline phosphatase however is normal so not likely an obstructive pattern, bilirubin is elevated explains patient's feeling that her skin is yellow. Urinalysis negative for urinary tract infection most likely color changes related to the elevated bilirubin. Urine culture is sent for back up.  Patient made aware that follow-up with her regular doctor or GI medicine for recheck of the enzymes and possible hepatitis screen is important. In addition patient may need a hida scan to further evaluate the function of the gallbladder. Patient stable for discharge home.  I personally performed the services described in this documentation, which was scribed in my presence. The recorded information has been reviewed and is accurate.    Fredia Sorrow, MD 06/03/14 1130

## 2014-06-03 NOTE — ED Notes (Signed)
[place] made aware that pt needs [place] procedure done.

## 2014-06-03 NOTE — ED Notes (Signed)
Patient given discharge instruction, verbalized understand. IV removed, band aid applied. Patient ambulatory out of the department.  

## 2014-06-03 NOTE — Progress Notes (Signed)
Subjective:    Patient ID: UNKNOWN Sabrina Vang, female    DOB: [birth year], 64 y.o.   MRN: [id number]  HPI  64 yr old femalel presented to the ED toay with c/o blood in her urine. 1st noticed blood in her urine yesterday. Describes her urine as orange in color.  Apparently five days ago, she had some epigastric pain which radiated across her lower rib case. The pain lasted about 3 hours.On Sunday the pain reoccurred and  she called EMS but was not transported. She felt like she was having a heart attack. She says it felt like a fist was in her epigastric region.  She c/o of her skin appearing yellow. She noticed it yesterday.   She says she uncontrollable shaking Sunday night and projectile vomiting.  She shook for about an hour.  She denies having any pain now.  She went to the ED due to her yellow skin color.  She says her foods taste different.  Stools are light gray in color.    06/03/2014 US abdomen:  IMPRESSION: Sludge within the gallbladder. Gallbladder wall is slightly thickened which could be related to chronic cholecystitis. No visible stones.  CBC    Component Value Date/Time   WBC 8.2 06/03/2014 0830   RBC 4.39 06/03/2014 0830   HGB 13.6 06/03/2014 0830   HCT 41.7 06/03/2014 0830   PLT 148* 06/03/2014 0830   MCV 95.0 06/03/2014 0830   MCH 31.0 06/03/2014 0830   MCHC 32.6 06/03/2014 0830   RDW 13.4 06/03/2014 0830   LYMPHSABS 1.4 06/03/2014 0830   MONOABS 0.7 06/03/2014 0830   EOSABS 0.9* 06/03/2014 0830   BASOSABS 0.1 06/03/2014 0830    CMP Latest Ref Rng 06/03/2014 04/13/2010  Glucose 70 - 99 mg/dL 133(H) 158(H)  BUN 6 - 23 mg/dL 11 14  Creatinine 0.50 - 1.10 mg/dL 0.75 0.96  Sodium 135 - 145 mmol/L 140 138  Potassium 3.5 - 5.1 mmol/L 3.6 4.0  Chloride 96 - 112 mmol/L 109 104  CO2 19 - 32 mmol/L 24 28  Calcium 8.4 - 10.5 mg/dL 8.9 9.2  Total Protein 6.0 - 8.3 g/dL 7.5 -  Total Bilirubin 0.3 - 1.2 mg/dL 2.1(H) -  Alkaline Phos 39 - 117 U/L 90 -  AST 0 - 37 U/L  166(H) -  ALT 0 - 35 U/L 304(H) -   Urinalysis    Component Value Date/Time   COLORURINE YELLOW 06/03/2014 [place] 06/03/2014 0744   LABSPEC 1.025 06/03/2014 0744   PHURINE 6.0 06/03/2014 0744   GLUCOSEU NEGATIVE 06/03/2014 0744   HGBUR TRACE* 06/03/2014 0744   BILIRUBINUR MODERATE* 06/03/2014 0744   KETONESUR TRACE* 06/03/2014 0744   PROTEINUR TRACE* 06/03/2014 0744   UROBILINOGEN 0.2 06/03/2014 0744   NITRITE NEGATIVE 06/03/2014 0744   LEUKOCYTESUR SMALL* 06/03/2014 0744        Review of Systems Widowed, two children in good health. One has COPD     Past Medical History  Diagnosis Date  . Diabetes mellitus without complication   . Hypertension     Past Surgical History  Procedure Laterality Date  . Abdominal hysterectomy    . Colonoscopy N/A 06/26/2013    Procedure: COLONOSCOPY;  Surgeon: Rogene Houston, MD;  Location: [facility];  Service: Endoscopy;  Laterality: N/A;  1030    No Known Allergies  Current Outpatient Prescriptions on File Prior to Visit  Medication Sig Dispense Refill  . aspirin EC 81 MG tablet Take 81  mg by mouth daily.    Marland Kitchen atenolol (TENORMIN) 25 MG tablet Take 25 mg by mouth at bedtime.     . metFORMIN (GLUCOPHAGE) 1000 MG tablet Take 500 mg by mouth 2 (two) times daily.  0  . Multiple Vitamin (MULTIVITAMIN WITH MINERALS) TABS tablet Take 1 tablet by mouth daily.    . naproxen sodium (ALEVE) 220 MG tablet Take 220 mg by mouth daily as needed (for pain).     No current facility-administered medications on file prior to visit.       Objective:   Physical Exam  Filed Vitals:   06/03/14 1534  Height: 5\' 5"  (1.651 m)  Weight: 159 lb 6.4 oz (72.303 kg)  Alert and oriented. Skin warm and dry. Oral mucosa is moist.   . Sclera icteric, conjunctivae is pink. Thyroid not enlarged. No cervical lymphadenopathy. Lungs clear. Heart regular rate and rhythm.  Abdomen is soft. Bowel sounds are positive. No hepatomegaly. No  abdominal masses felt. No tenderness.  No edema to lower extremities.           Assessment & Plan:  Abdominal pain resolved. Probable has passed a CBD stone. Dr. Laural Golden in with pain.  Will get an MRCP. If normal, will refer to Dr. Arnoldo Morale.

## 2014-06-03 NOTE — ED Notes (Signed)
Pt reports epigastric pain,chills,vomiting on Sunday. Pt reports only symptom at this time is hematuria.

## 2014-06-03 NOTE — Patient Instructions (Signed)
MRCP. If normal, will refer to Dr. Arnoldo Morale.

## 2014-06-03 NOTE — ED Notes (Signed)
MD at bedside. 

## 2014-06-04 ENCOUNTER — Ambulatory Visit (HOSPITAL_COMMUNITY)
Admission: RE | Admit: 2014-06-04 | Discharge: 2014-06-04 | Disposition: A | Discharge status: Home / Self Care | Payer: BLUE CROSS/BLUE SHIELD | Source: Ambulatory Visit | Attending: Internal Medicine | Admitting: Internal Medicine

## 2014-06-04 ENCOUNTER — Other Ambulatory Visit (INDEPENDENT_AMBULATORY_CARE_PROVIDER_SITE_OTHER): Payer: Self-pay | Admitting: Internal Medicine

## 2014-06-04 DIAGNOSIS — R17 Unspecified jaundice: Secondary | ICD-10-CM | POA: Insufficient documentation

## 2014-06-04 DIAGNOSIS — R1011 Right upper quadrant pain: Secondary | ICD-10-CM | POA: Insufficient documentation

## 2014-06-04 DIAGNOSIS — R945 Abnormal results of liver function studies: Secondary | ICD-10-CM | POA: Diagnosis not present

## 2014-06-04 DIAGNOSIS — I10 Essential (primary) hypertension: Secondary | ICD-10-CM

## 2014-06-04 LAB — URINE CULTURE: Colony Count: 50000

## 2014-06-04 MED ORDER — GADOBENATE DIMEGLUMINE 529 MG/ML IV SOLN
15.0000 mL | Freq: Once | INTRAVENOUS | Status: AC | PRN
  Administered 2014-06-04: 15 mL via INTRAVENOUS

## 2014-06-05 ENCOUNTER — Telehealth (INDEPENDENT_AMBULATORY_CARE_PROVIDER_SITE_OTHER): Payer: Self-pay | Admitting: Internal Medicine

## 2014-06-05 DIAGNOSIS — R748 Abnormal levels of other serum enzymes: Secondary | ICD-10-CM

## 2014-06-05 LAB — COMPREHENSIVE METABOLIC PANEL
ALK PHOS: 87 U/L (ref 39–117)
ALT: 154 U/L — ABNORMAL HIGH (ref 0–35)
AST: 54 U/L — ABNORMAL HIGH (ref 0–37)
Albumin: 3.8 g/dL (ref 3.5–5.2)
BUN: 12 mg/dL (ref 6–23)
CO2: 27 mEq/L (ref 19–32)
Calcium: 9.2 mg/dL (ref 8.4–10.5)
Chloride: 103 mEq/L (ref 96–112)
Creat: 0.86 mg/dL (ref 0.50–1.10)
GLUCOSE: 166 mg/dL — AB (ref 70–99)
POTASSIUM: 3.9 meq/L (ref 3.5–5.3)
Sodium: 140 mEq/L (ref 135–145)
TOTAL PROTEIN: 7.1 g/dL (ref 6.0–8.3)
Total Bilirubin: 1.1 mg/dL (ref 0.2–1.2)

## 2014-06-05 NOTE — Telephone Encounter (Signed)
Am going to get a Hep C antibody, and do a referral to Dr. Arnoldo Morale.

## 2014-06-06 LAB — HEPATITIS C ANTIBODY: HCV AB: NEGATIVE

## 2014-07-02 ENCOUNTER — Encounter (HOSPITAL_COMMUNITY): Payer: Self-pay

## 2014-07-02 ENCOUNTER — Encounter (HOSPITAL_COMMUNITY)
Admission: RE | Admit: 2014-07-02 | Discharge: 2014-07-02 | Disposition: A | Discharge status: Home / Self Care | Payer: BLUE CROSS/BLUE SHIELD | Source: Ambulatory Visit | Attending: General Surgery | Admitting: General Surgery

## 2014-07-02 DIAGNOSIS — Z01818 Encounter for other preprocedural examination: Secondary | ICD-10-CM | POA: Insufficient documentation

## 2014-07-02 DIAGNOSIS — K8020 Calculus of gallbladder without cholecystitis without obstruction: Secondary | ICD-10-CM | POA: Insufficient documentation

## 2014-07-02 LAB — HEPATIC FUNCTION PANEL
ALT: 24 U/L (ref 0–35)
AST: 27 U/L (ref 0–37)
Albumin: 4 g/dL (ref 3.5–5.2)
Alkaline Phosphatase: 59 U/L (ref 39–117)
Bilirubin, Direct: 0.1 mg/dL (ref 0.0–0.5)
Indirect Bilirubin: 0.5 mg/dL (ref 0.3–0.9)
Total Bilirubin: 0.6 mg/dL (ref 0.3–1.2)
Total Protein: 7.6 g/dL (ref 6.0–8.3)

## 2014-07-02 LAB — CBC WITH DIFFERENTIAL/PLATELET
Basophils Absolute: 0 10*3/uL (ref 0.0–0.1)
Basophils Relative: 1 % (ref 0–1)
EOS ABS: 0.3 10*3/uL (ref 0.0–0.7)
EOS PCT: 4 % (ref 0–5)
HCT: 41.3 % (ref 36.0–46.0)
HEMOGLOBIN: 13.6 g/dL (ref 12.0–15.0)
LYMPHS ABS: 2.8 10*3/uL (ref 0.7–4.0)
Lymphocytes Relative: 38 % (ref 12–46)
MCH: 30.8 pg (ref 26.0–34.0)
MCHC: 32.9 g/dL (ref 30.0–36.0)
MCV: 93.4 fL (ref 78.0–100.0)
Monocytes Absolute: 0.5 10*3/uL (ref 0.1–1.0)
Monocytes Relative: 7 % (ref 3–12)
NEUTROS ABS: 3.7 10*3/uL (ref 1.7–7.7)
Neutrophils Relative %: 50 % (ref 43–77)
PLATELETS: 166 10*3/uL (ref 150–400)
RBC: 4.42 MIL/uL (ref 3.87–5.11)
RDW: 12.7 % (ref 11.5–15.5)
WBC: 7.3 10*3/uL (ref 4.0–10.5)

## 2014-07-02 LAB — BASIC METABOLIC PANEL
Anion gap: 5 (ref 5–15)
BUN: 16 mg/dL (ref 6–23)
CALCIUM: 9.8 mg/dL (ref 8.4–10.5)
CO2: 28 mmol/L (ref 19–32)
Chloride: 105 mmol/L (ref 96–112)
Creatinine, Ser: 0.83 mg/dL (ref 0.50–1.10)
GFR calc Af Amer: 85 mL/min — ABNORMAL LOW (ref >90)
GFR, EST NON AFRICAN AMERICAN: 73 mL/min — AB (ref >90)
GLUCOSE: 89 mg/dL (ref 70–99)
Potassium: 3.9 mmol/L (ref 3.5–5.1)
SODIUM: 138 mmol/L (ref 135–145)

## 2014-07-02 NOTE — H&P (Signed)
  NTS SOAP Note  Vital Signs:  Vitals as of: 9/35/7017: Systolic 793: Diastolic 75: Heart Rate 78: Temp 59F: Height 46ft 5in: Weight 158Lbs 0 Ounces: BMI 26.29  BMI : 26.29 kg/m2  Subjective: This 64 year old female presents for of gallstone jaundice.  Was found to have jaundice.  MRCP showed no choledocholithiasis,  biliary sludge,  and thickened gallbladder wall.  Asymptomatic at the present time.  No fever,  chills.  Review of Symptoms:  Constitutional:unremarkable   Head:unremarkable Eyes:unremarkable   Nose/Mouth/Throat:unremarkable Cardiovascular:  unremarkable Respiratory:unremarkable Gastrointestinal:  unremarkable   Genitourinary:dysuria Musculoskeletal:unremarkable Skin:unremarkable Hematolgic/Lymphatic:unremarkable   Allergic/Immunologic:unremarkable   Past Medical History:  Reviewed  Past Medical History  Surgical History: TAH Medical Problems: HTN,  NIDDM Allergies: nkda Medications: metformin,  atenolol,  baby asa   Social History:Reviewed  Social History  Preferred Language: English Race:  White Ethnicity: Not Hispanic / Latino Age: 78 year Marital Status:  S Alcohol: socially   Smoking Status: Never smoker reviewed on 07/02/2014 Functional Status reviewed on 07/02/2014 ------------------------------------------------ Bathing: Normal Cooking: Normal Dressing: Normal Driving: Normal Eating: Normal Managing Meds: Normal Oral Care: Normal Shopping: Normal Toileting: Normal Transferring: Normal Walking: Normal Cognitive Status reviewed on 07/02/2014 ------------------------------------------------ Attention: Normal Decision Making: Normal Language: Normal Memory: Normal Motor: Normal Perception: Normal Problem Solving: Normal Visual and Spatial: Normal   Family History:Reviewed  Family Health History Mother  Father, Deceased; Heart attack (myocardial infarction);     Objective Information: General:Well  appearing, well nourished in no distress. Heart:RRR, no murmur Lungs:  CTA bilaterally, no wheezes, rhonchi, rales.  Breathing unlabored. Abdomen:Soft, NT/ND, normal bowel sounds, no HSM, no masses.  No peritoneal signs.  Assessment:Biliary sludge,  h/o jaundice  Diagnoses: 574.20  K80.20 Gallstone (Calculus of gallbladder without cholecystitis without obstruction)  Procedures: 90300 - OFFICE OUTPATIENT NEW 30 MINUTES    Plan:  Scheduled for laparoscopic cholecystectomy on 07/06/14.   Patient Education:Alternative treatments to surgery were discussed with patient (and family).  Risks and benefits  of procedure including bleeding,  infection,  hepatobiliary injury,  and the possibility of an open procedure were fully explained to the patient (and family) who gave informed consent. Patient/family questions were addressed.  Follow-up:Pending Surgery

## 2014-07-02 NOTE — Patient Instructions (Signed)
Sabrina Vang  07/02/2014   Your procedure is scheduled on:  07/06/2014  Report to [facility] at  57  AM.  Call this number if you have problems the morning of surgery: [phone]   Remember:   Do not eat food or drink liquids after midnight.   Take these medicines the morning of surgery with A SIP OF WATER:  Atenolol, hydrocodone   Do not wear jewelry, make-up or nail polish.  Do not wear lotions, powders, or perfumes.   Do not shave 48 hours prior to surgery. Men may shave face and neck.  Do not bring valuables to the hospital.  [facility] is not responsible for any belongings or valuables.               Contacts, dentures or bridgework may not be worn into surgery.  Leave suitcase in the car. After surgery it may be brought to your room.  For patients admitted to the hospital, discharge time is determined by your treatment team.               Patients discharged the day of surgery will not be allowed to drive home.  Name and phone number of your driver: family  Special Instructions: Shower using CHG 2 nights before surgery and the night before surgery.  If you shower the day of surgery use CHG.  Use special wash - you have one bottle of CHG for all showers.  You should use approximately 1/3 of the bottle for each shower.   Please read over the following fact sheets that you were given: Pain Booklet, Coughing and Deep Breathing, Surgical Site Infection Prevention, Anesthesia Post-op Instructions and Care and Recovery After Surgery Laparoscopic Cholecystectomy Laparoscopic cholecystectomy is surgery to remove the gallbladder. The gallbladder is located in the upper right part of the abdomen, behind the liver. It is a storage sac for bile produced in the liver. Bile aids in the digestion and absorption of fats. Cholecystectomy is often done for inflammation of the gallbladder (cholecystitis). This condition is usually caused by a buildup of gallstones (cholelithiasis) in your  gallbladder. Gallstones can block the flow of bile, resulting in inflammation and pain. In severe cases, emergency surgery may be required. When emergency surgery is not required, you will have time to prepare for the procedure. Laparoscopic surgery is an alternative to open surgery. Laparoscopic surgery has a shorter recovery time. Your common bile duct may also need to be examined during the procedure. If stones are found in the common bile duct, they may be removed. LET [facility] CARE PROVIDER KNOW ABOUT:  Any allergies you have.  All medicines you are taking, including vitamins, herbs, eye drops, creams, and over-the-counter medicines.  Previous problems you or members of your family have had with the use of anesthetics.  Any blood disorders you have.  Previous surgeries you have had.  Medical conditions you have. RISKS AND COMPLICATIONS Generally, this is a safe procedure. However, as with any procedure, complications can occur. Possible complications include:  Infection.  Damage to the common bile duct, nerves, arteries, veins, or other internal organs such as the stomach, liver, or intestines.  Bleeding.  A stone may remain in the common bile duct.  A bile leak from the cyst duct that is clipped when your gallbladder is removed.  The need to convert to open surgery, which requires a larger incision in the abdomen. This may be necessary if your surgeon thinks it  is not safe to continue with a laparoscopic procedure. BEFORE THE PROCEDURE  Ask your health care provider about changing or stopping any regular medicines. You will need to stop taking aspirin or blood thinners at least 5 days prior to surgery.  Do not eat or drink anything after midnight the night before surgery.  Let your health care provider know if you develop a cold or other infectious problem before surgery. PROCEDURE   You will be given medicine to make you sleep through the procedure (general  anesthetic). A breathing tube will be placed in your mouth.  When you are asleep, your surgeon will make several small cuts (incisions) in your abdomen.  A thin, lighted tube with a tiny camera on the end (laparoscope) is inserted through one of the small incisions. The camera on the laparoscope sends a picture to a TV screen in the operating room. This gives the surgeon a good view inside your abdomen.  A gas will be pumped into your abdomen. This expands your abdomen so that the surgeon has more room to perform the surgery.  Other tools needed for the procedure are inserted through the other incisions. The gallbladder is removed through one of the incisions.  After the removal of your gallbladder, the incisions will be closed with stitches, staples, or skin glue. AFTER THE PROCEDURE  You will be taken to a recovery area where your progress will be checked often.  You may be allowed to go home the same day if your pain is controlled and you can tolerate liquids. Document Released: 04/10/2005 Document Revised: 01/29/2013 Document Reviewed: 11/20/2012 [facility] Patient Information 2015 [place], [place]. This information is not intended to replace advice given to you by your health care provider. Make sure you discuss any questions you have with your health care provider. PATIENT INSTRUCTIONS POST-ANESTHESIA  IMMEDIATELY FOLLOWING SURGERY:  Do not drive or operate machinery for the first twenty four hours after surgery.  Do not make any important decisions for twenty four hours after surgery or while taking narcotic pain medications or sedatives.  If you develop intractable nausea and vomiting or a severe headache please notify your doctor immediately.  FOLLOW-UP:  Please make an appointment with your surgeon as instructed. You do not need to follow up with anesthesia unless specifically instructed to do so.  WOUND CARE INSTRUCTIONS (if applicable):  Keep a dry clean dressing on the  anesthesia/puncture wound site if there is drainage.  Once the wound has quit draining you may leave it open to air.  Generally you should leave the bandage intact for twenty four hours unless there is drainage.  If the epidural site drains for more than 36-48 hours please call the anesthesia department.  QUESTIONS?:  Please feel free to call your physician or the hospital operator if you have any questions, and they will be happy to assist you.

## 2014-07-06 ENCOUNTER — Ambulatory Visit (HOSPITAL_COMMUNITY)
Admission: RE | Admit: 2014-07-06 | Discharge: 2014-07-06 | Disposition: A | Discharge status: Home / Self Care | Payer: BLUE CROSS/BLUE SHIELD | Source: Ambulatory Visit | Attending: General Surgery | Admitting: General Surgery

## 2014-07-06 ENCOUNTER — Encounter (HOSPITAL_COMMUNITY): Payer: Self-pay | Admitting: *Deleted

## 2014-07-06 ENCOUNTER — Ambulatory Visit (HOSPITAL_COMMUNITY): Payer: BLUE CROSS/BLUE SHIELD | Admitting: Anesthesiology

## 2014-07-06 ENCOUNTER — Encounter (HOSPITAL_COMMUNITY)
Admission: RE | Disposition: A | Discharge status: Home / Self Care | Payer: Self-pay | Source: Ambulatory Visit | Attending: General Surgery

## 2014-07-06 DIAGNOSIS — Z7982 Long term (current) use of aspirin: Secondary | ICD-10-CM | POA: Diagnosis not present

## 2014-07-06 DIAGNOSIS — K8010 Calculus of gallbladder with chronic cholecystitis without obstruction: Secondary | ICD-10-CM | POA: Insufficient documentation

## 2014-07-06 DIAGNOSIS — K838 Other specified diseases of biliary tract: Secondary | ICD-10-CM | POA: Diagnosis not present

## 2014-07-06 DIAGNOSIS — I10 Essential (primary) hypertension: Secondary | ICD-10-CM | POA: Diagnosis not present

## 2014-07-06 DIAGNOSIS — E119 Type 2 diabetes mellitus without complications: Secondary | ICD-10-CM | POA: Diagnosis not present

## 2014-07-06 DIAGNOSIS — Z79899 Other long term (current) drug therapy: Secondary | ICD-10-CM | POA: Insufficient documentation

## 2014-07-06 DIAGNOSIS — Z90710 Acquired absence of both cervix and uterus: Secondary | ICD-10-CM | POA: Diagnosis not present

## 2014-07-06 DIAGNOSIS — K8020 Calculus of gallbladder without cholecystitis without obstruction: Secondary | ICD-10-CM | POA: Diagnosis present

## 2014-07-06 HISTORY — PX: CHOLECYSTECTOMY: SHX55

## 2014-07-06 LAB — GLUCOSE, CAPILLARY
Glucose-Capillary: 116 mg/dL — ABNORMAL HIGH (ref 70–99)
Glucose-Capillary: 167 mg/dL — ABNORMAL HIGH (ref 70–99)

## 2014-07-06 SURGERY — LAPAROSCOPIC CHOLECYSTECTOMY
Anesthesia: General | Site: Abdomen

## 2014-07-06 MED ORDER — KETOROLAC TROMETHAMINE 30 MG/ML IJ SOLN
30.0000 mg | Freq: Once | INTRAMUSCULAR | Status: AC
  Administered 2014-07-06: 30 mg via INTRAVENOUS

## 2014-07-06 MED ORDER — CIPROFLOXACIN IN D5W 400 MG/200ML IV SOLN
400.0000 mg | INTRAVENOUS | Status: AC
  Administered 2014-07-06: 400 mg via INTRAVENOUS

## 2014-07-06 MED ORDER — FENTANYL CITRATE 0.05 MG/ML IJ SOLN
25.0000 ug | INTRAMUSCULAR | Status: DC | PRN
  Administered 2014-07-06 (×2): 50 ug via INTRAVENOUS
  Filled 2014-07-06: qty 2

## 2014-07-06 MED ORDER — 0.9 % SODIUM CHLORIDE (POUR BTL) OPTIME
TOPICAL | Status: DC | PRN
  Administered 2014-07-06: 1000 mL

## 2014-07-06 MED ORDER — POVIDONE-IODINE 10 % EX OINT
TOPICAL_OINTMENT | CUTANEOUS | Status: AC
  Filled 2014-07-06: qty 1

## 2014-07-06 MED ORDER — CIPROFLOXACIN IN D5W 400 MG/200ML IV SOLN
INTRAVENOUS | Status: AC
  Filled 2014-07-06: qty 200

## 2014-07-06 MED ORDER — ROCURONIUM BROMIDE 100 MG/10ML IV SOLN
INTRAVENOUS | Status: DC | PRN
  Administered 2014-07-06: 5 mg via INTRAVENOUS
  Administered 2014-07-06: 25 mg via INTRAVENOUS
  Administered 2014-07-06: 5 mg via INTRAVENOUS

## 2014-07-06 MED ORDER — MIDAZOLAM HCL 2 MG/2ML IJ SOLN
INTRAMUSCULAR | Status: AC
  Filled 2014-07-06: qty 2

## 2014-07-06 MED ORDER — FENTANYL CITRATE 0.05 MG/ML IJ SOLN
INTRAMUSCULAR | Status: AC
  Filled 2014-07-06: qty 5

## 2014-07-06 MED ORDER — OXYCODONE-ACETAMINOPHEN 7.5-325 MG PO TABS: 1.0000 | ORAL_TABLET | ORAL | Status: DC | PRN

## 2014-07-06 MED ORDER — ONDANSETRON HCL 4 MG/2ML IJ SOLN
INTRAMUSCULAR | Status: AC
Start: 2014-07-06 — End: 2014-07-06
  Filled 2014-07-06: qty 2

## 2014-07-06 MED ORDER — NEOSTIGMINE METHYLSULFATE 10 MG/10ML IV SOLN
INTRAVENOUS | Status: AC
  Filled 2014-07-06: qty 1

## 2014-07-06 MED ORDER — GLYCOPYRROLATE 0.2 MG/ML IJ SOLN
INTRAMUSCULAR | Status: AC
  Filled 2014-07-06: qty 1

## 2014-07-06 MED ORDER — BUPIVACAINE HCL (PF) 0.5 % IJ SOLN
INTRAMUSCULAR | Status: DC | PRN
  Administered 2014-07-06: 9 mL

## 2014-07-06 MED ORDER — BUPIVACAINE HCL (PF) 0.5 % IJ SOLN
INTRAMUSCULAR | Status: AC
  Filled 2014-07-06: qty 30

## 2014-07-06 MED ORDER — LIDOCAINE HCL (CARDIAC) 10 MG/ML IV SOLN
INTRAVENOUS | Status: DC | PRN
  Administered 2014-07-06: 40 mg via INTRAVENOUS

## 2014-07-06 MED ORDER — DEXAMETHASONE SODIUM PHOSPHATE 4 MG/ML IJ SOLN
4.0000 mg | Freq: Once | INTRAMUSCULAR | Status: AC
  Administered 2014-07-06: 4 mg via INTRAVENOUS

## 2014-07-06 MED ORDER — NEOSTIGMINE METHYLSULFATE 10 MG/10ML IV SOLN
INTRAVENOUS | Status: DC | PRN
  Administered 2014-07-06: 1 mg via INTRAVENOUS
  Administered 2014-07-06: 4 mg via INTRAVENOUS

## 2014-07-06 MED ORDER — LIDOCAINE HCL (PF) 1 % IJ SOLN
INTRAMUSCULAR | Status: AC
  Filled 2014-07-06: qty 5

## 2014-07-06 MED ORDER — PROPOFOL 10 MG/ML IV BOLUS
INTRAVENOUS | Status: AC
  Filled 2014-07-06: qty 20

## 2014-07-06 MED ORDER — FENTANYL CITRATE 0.05 MG/ML IJ SOLN
INTRAMUSCULAR | Status: DC | PRN
  Administered 2014-07-06 (×5): 50 ug via INTRAVENOUS

## 2014-07-06 MED ORDER — POVIDONE-IODINE 10 % OINT PACKET
TOPICAL_OINTMENT | CUTANEOUS | Status: DC | PRN
  Administered 2014-07-06: 1 via TOPICAL

## 2014-07-06 MED ORDER — CHLORHEXIDINE GLUCONATE 4 % EX LIQD: 1.0000 "application " | Freq: Once | CUTANEOUS | Status: DC

## 2014-07-06 MED ORDER — LACTATED RINGERS IV SOLN
INTRAVENOUS | Status: DC
  Administered 2014-07-06 (×2): via INTRAVENOUS

## 2014-07-06 MED ORDER — HEMOSTATIC AGENTS (NO CHARGE) OPTIME
TOPICAL | Status: DC | PRN
  Administered 2014-07-06 (×2): 1 via TOPICAL

## 2014-07-06 MED ORDER — ONDANSETRON HCL 4 MG/2ML IJ SOLN
4.0000 mg | Freq: Once | INTRAMUSCULAR | Status: AC | PRN
  Administered 2014-07-06: 4 mg via INTRAVENOUS
  Filled 2014-07-06: qty 2

## 2014-07-06 MED ORDER — PROPOFOL 10 MG/ML IV BOLUS
INTRAVENOUS | Status: DC | PRN
  Administered 2014-07-06: 150 mg via INTRAVENOUS
  Administered 2014-07-06: 50 mg via INTRAVENOUS

## 2014-07-06 MED ORDER — KETOROLAC TROMETHAMINE 30 MG/ML IJ SOLN
INTRAMUSCULAR | Status: AC
  Filled 2014-07-06: qty 1

## 2014-07-06 MED ORDER — DEXAMETHASONE SODIUM PHOSPHATE 4 MG/ML IJ SOLN
INTRAMUSCULAR | Status: AC
  Filled 2014-07-06: qty 1

## 2014-07-06 MED ORDER — GLYCOPYRROLATE 0.2 MG/ML IJ SOLN
INTRAMUSCULAR | Status: DC | PRN
  Administered 2014-07-06: 0.2 mg via INTRAVENOUS
  Administered 2014-07-06: .8 mg via INTRAVENOUS

## 2014-07-06 MED ORDER — MIDAZOLAM HCL 2 MG/2ML IJ SOLN
1.0000 mg | INTRAMUSCULAR | Status: DC | PRN
  Administered 2014-07-06: 2 mg via INTRAVENOUS

## 2014-07-06 MED ORDER — ONDANSETRON HCL 4 MG/2ML IJ SOLN
4.0000 mg | Freq: Once | INTRAMUSCULAR | Status: AC
  Administered 2014-07-06: 4 mg via INTRAVENOUS

## 2014-07-06 MED ORDER — ROCURONIUM BROMIDE 50 MG/5ML IV SOLN
INTRAVENOUS | Status: AC
  Filled 2014-07-06: qty 1

## 2014-07-06 MED ORDER — FLUCONAZOLE 100 MG PO TABS: 100.0000 mg | ORAL_TABLET | Freq: Every day | ORAL | Status: DC

## 2014-07-06 SURGICAL SUPPLY — 44 items
APPLIER CLIP LAPSCP 10X32 DD (CLIP) ×2 IMPLANT
BAG HAMPER (MISCELLANEOUS) ×2 IMPLANT
BAG SPEC RTRVL LRG 6X4 10 (ENDOMECHANICALS) ×1
CHLORAPREP W/TINT 26ML (MISCELLANEOUS) ×2 IMPLANT
CLOTH BEACON ORANGE TIMEOUT ST (SAFETY) ×2 IMPLANT
COVER LIGHT HANDLE STERIS (MISCELLANEOUS) ×4 IMPLANT
CUTTER LINEAR ENDO 35 ART THIN (STAPLE) ×1 IMPLANT
DECANTER SPIKE VIAL GLASS SM (MISCELLANEOUS) ×2 IMPLANT
ELECT REM PT RETURN 9FT ADLT (ELECTROSURGICAL) ×2
ELECTRODE REM PT RTRN 9FT ADLT (ELECTROSURGICAL) ×1 IMPLANT
FILTER SMOKE EVAC LAPAROSHD (FILTER) ×2 IMPLANT
FORMALIN 10 PREFIL 120ML (MISCELLANEOUS) ×2 IMPLANT
GLOVE BIO SURGEON STRL SZ7 (GLOVE) ×1 IMPLANT
GLOVE BIOGEL PI IND STRL 7.0 (GLOVE) IMPLANT
GLOVE BIOGEL PI INDICATOR 7.0 (GLOVE) ×3
GLOVE ECLIPSE 6.5 STRL STRAW (GLOVE) ×1 IMPLANT
GLOVE SURG SS PI 7.5 STRL IVOR (GLOVE) ×2 IMPLANT
GOWN STRL REUS W/ TWL XL LVL3 (GOWN DISPOSABLE) ×1 IMPLANT
GOWN STRL REUS W/TWL LRG LVL3 (GOWN DISPOSABLE) ×4 IMPLANT
GOWN STRL REUS W/TWL XL LVL3 (GOWN DISPOSABLE) ×2
HEMOSTAT SNOW SURGICEL 2X4 (HEMOSTASIS) ×3 IMPLANT
INST SET LAPROSCOPIC AP (KITS) ×2 IMPLANT
IV NS IRRIG 3000ML ARTHROMATIC (IV SOLUTION) IMPLANT
KIT ROOM TURNOVER APOR (KITS) ×2 IMPLANT
MANIFOLD NEPTUNE II (INSTRUMENTS) ×2 IMPLANT
NDL INSUFFLATION 14GA 120MM (NEEDLE) ×1 IMPLANT
NEEDLE INSUFFLATION 14GA 120MM (NEEDLE) ×2 IMPLANT
NS IRRIG 1000ML POUR BTL (IV SOLUTION) ×2 IMPLANT
PACK LAP CHOLE LZT030E (CUSTOM PROCEDURE TRAY) ×2 IMPLANT
PAD ARMBOARD 7.5X6 YLW CONV (MISCELLANEOUS) ×2 IMPLANT
POUCH SPECIMEN RETRIEVAL 10MM (ENDOMECHANICALS) ×2 IMPLANT
SET BASIN LINEN APH (SET/KITS/TRAYS/PACK) ×2 IMPLANT
SET TUBE IRRIG SUCTION NO TIP (IRRIGATION / IRRIGATOR) IMPLANT
SLEEVE ENDOPATH XCEL 5M (ENDOMECHANICALS) ×2 IMPLANT
SPONGE GAUZE 2X2 8PLY STRL LF (GAUZE/BANDAGES/DRESSINGS) ×5 IMPLANT
STAPLER VISISTAT (STAPLE) ×2 IMPLANT
SUT VICRYL 0 UR6 27IN ABS (SUTURE) ×2 IMPLANT
TAPE CLOTH SURG 4X10 WHT LF (GAUZE/BANDAGES/DRESSINGS) ×1 IMPLANT
TROCAR ENDO BLADELESS 11MM (ENDOMECHANICALS) ×2 IMPLANT
TROCAR XCEL NON-BLD 5MMX100MML (ENDOMECHANICALS) ×2 IMPLANT
TROCAR XCEL UNIV SLVE 11M 100M (ENDOMECHANICALS) ×2 IMPLANT
TUBING INSUFFLATION (TUBING) ×2 IMPLANT
WARMER LAPAROSCOPE (MISCELLANEOUS) ×2 IMPLANT
YANKAUER SUCT 12FT TUBE ARGYLE (SUCTIONS) ×2 IMPLANT

## 2014-07-06 NOTE — Transfer of Care (Signed)
Immediate Anesthesia Transfer of Care Note  Patient: Sabrina Vang  Procedure(s) Performed: Procedure(s): LAPAROSCOPIC CHOLECYSTECTOMY (N/A)  Patient Location: PACU  Anesthesia Type:General  Level of Consciousness: sedated and patient cooperative  Airway & Oxygen Therapy: Patient Spontanous Breathing and non-rebreather face mask  Post-op Assessment: Report given to RN, Post -op Vital signs reviewed and stable and Patient moving all extremities  Post vital signs: Reviewed and stable    Complications: No apparent anesthesia complications

## 2014-07-06 NOTE — Interval H&P Note (Signed)
History and Physical Interval Note:  07/06/2014 8:33 AM  Sabrina Vang  has presented today for surgery, with the diagnosis of cholelithiasis  The various methods of treatment have been discussed with the patient and family. After consideration of risks, benefits and other options for treatment, the patient has consented to  Procedure(s): LAPAROSCOPIC CHOLECYSTECTOMY (N/A) as a surgical intervention .  The patient's history has been reviewed, patient examined, no change in status, stable for surgery.  I have reviewed the patient's chart and labs.  Questions were answered to the patient's satisfaction.     Aviva Signs A

## 2014-07-06 NOTE — Discharge Instructions (Signed)

## 2014-07-06 NOTE — Anesthesia Postprocedure Evaluation (Signed)
  Anesthesia Post-op Note  Patient: Sabrina Vang  Procedure(s) Performed: Procedure(s): LAPAROSCOPIC CHOLECYSTECTOMY (N/A)  Patient Location: PACU  Anesthesia Type:General  Level of Consciousness: awake, alert  and patient cooperative  Airway and Oxygen Therapy: Patient Spontanous Breathing  Post-op Pain: mild  Post-op Assessment: Post-op Vital signs reviewed, Patient's Cardiovascular Status Stable, Respiratory Function Stable, Patent Airway, No signs of Nausea or vomiting and Pain level controlled  Post-op Vital Signs: Reviewed and stable    Complications: No apparent anesthesia complications

## 2014-07-06 NOTE — Anesthesia Preprocedure Evaluation (Signed)
Anesthesia Evaluation  Patient identified by MRN, date of birth, ID band Patient awake    Reviewed: Allergy & Precautions, NPO status , Patient's Chart, lab work & pertinent test results, reviewed documented beta blocker date and time   Airway Mallampati: II  TM Distance: >3 FB     Dental  (+) Teeth Intact, Implants   Pulmonary neg pulmonary ROS,  breath sounds clear to auscultation        Cardiovascular hypertension, Pt. on medications and Pt. on home beta blockers Rhythm:Regular     Neuro/Psych    GI/Hepatic negative GI ROS,   Endo/Other  diabetes, Well Controlled, Type 2, Oral Hypoglycemic Agents  Renal/GU      Musculoskeletal   Abdominal   Peds  Hematology   Anesthesia Other Findings   Reproductive/Obstetrics                             Anesthesia Physical Anesthesia Plan  ASA: II  Anesthesia Plan: General   Post-op Pain Management:    Induction: Intravenous  Airway Management Planned: Oral ETT  Additional Equipment:   Intra-op Plan:   Post-operative Plan: Extubation in OR  Informed Consent: I have reviewed the patients History and Physical, chart, labs and discussed the procedure including the risks, benefits and alternatives for the proposed anesthesia with the patient or authorized representative who has indicated his/her understanding and acceptance.     Plan Discussed with:   Anesthesia Plan Comments:         Anesthesia Quick Evaluation

## 2014-07-06 NOTE — Anesthesia Procedure Notes (Signed)
Procedure Name: Intubation Date/Time: 07/06/2014 9:09 AM Performed by: Vista Deck Pre-anesthesia Checklist: Patient identified, Patient being monitored, Timeout performed, Emergency Drugs available and Suction available Patient Re-evaluated:Patient Re-evaluated prior to inductionOxygen Delivery Method: Circle System Utilized Preoxygenation: Pre-oxygenation with 100% oxygen Intubation Type: IV induction Ventilation: Mask ventilation without difficulty Laryngoscope Size: Miller and 2 Grade View: Grade I Tube type: Oral Tube size: 7.0 mm Number of attempts: 1 Airway Equipment and Method: Stylet Placement Confirmation: ETT inserted through vocal cords under direct vision,  positive ETCO2 and breath sounds checked- equal and bilateral Secured at: 21 cm Tube secured with: Tape Dental Injury: Teeth and Oropharynx as per pre-operative assessment

## 2014-07-06 NOTE — Op Note (Signed)
Patient:  Sabrina Vang  DOB:  30-Aug-1950  MRN:  [id number]   Preop Diagnosis:  Cholelithiasis, history of gallstone pancreatitis  Postop Diagnosis:  Same  Procedure:  Laparoscopic cholecystectomy  Surgeon:  Aviva Signs, M.D.  Anes:  Gen. endotracheal  Indications:  Patient is a 64 year old white female previously found to have gallstone pancreatitis who now presents for laparoscopic cholecystectomy due to cholelithiasis. The risks and benefits of the procedure including bleeding, infection, hepatobiliary injury, and the possibility of an open procedure were fully explained to the patient, who gave informed consent.  Procedure note:  The patient is placed the supine position. After induction of general endotracheal anesthesia, the abdomen was prepped and draped using usual sterile technique with ChloraPrep. Surgical site confirmation was performed.  A supraumbilical incision was made down to the fascia. A Veress needle was introduced into the abdominal cavity and confirmation of placement was done using the saline drop test. The abdomen was then insufflated to 16 mmHg pressure. An 11 mm trocar was introduced into the abdominal cavity under direct visualization without difficulty. The patient was placed in reverse Trendelenburg position and an additional 11 mm trocar was placed the epigastric region and 5 mm trochars were placed the right upper quadrant and right flank regions. Liver was inspected and noted to be within normal limits. The gallbladder was retracted in a dynamic fashion in order to expose the triangle of Calot. The cystic duct was first identified. Its junction to the infundibulum was fully identified. Endoclips placed proximally distally on the cystic duct, and the cystic duct was divided. This was likewise done to the cystic artery. The gallbladder was freed away from the gallbladder fossa using Bovie electrocautery. The gallbladder was delivered through the epigastric trocar site  using an Endo Catch bag. The gallbladder fossa was inspected and no abnormal bleeding or bile leakage was noted. Surgicel is placed the gallbladder fossa. All fluid and air were then evacuated from the abdominal cavity prior to removal of the trochars.  All wounds were irrigated with normal saline. All wounds were injected with 0.5% Sensorcaine. The supraumbilical fascia as well as epigastric fascia reapproximated using 0 Vicryl interrupted sutures. All skin incisions were closed using staples. Betadine ointment and dry sterile dressings were applied.  All tape and needle counts were correct the end of the procedure. Patient was extubated in the operating room and transferred to PACU in stable condition.  Complications:  None  EBL:  Minimal  Specimen:  Gallbladder

## 2014-07-07 ENCOUNTER — Encounter (HOSPITAL_COMMUNITY): Payer: Self-pay | Admitting: General Surgery

## 2014-09-14 ENCOUNTER — Other Ambulatory Visit (HOSPITAL_COMMUNITY): Payer: Self-pay | Admitting: Pulmonary Disease

## 2014-09-14 DIAGNOSIS — Z1231 Encounter for screening mammogram for malignant neoplasm of breast: Secondary | ICD-10-CM

## 2014-10-19 ENCOUNTER — Ambulatory Visit (HOSPITAL_COMMUNITY)
Admission: RE | Admit: 2014-10-19 | Discharge: 2014-10-19 | Disposition: A | Discharge status: Home / Self Care | Payer: BLUE CROSS/BLUE SHIELD | Source: Ambulatory Visit | Attending: Pulmonary Disease | Admitting: Pulmonary Disease

## 2014-10-19 DIAGNOSIS — Z1231 Encounter for screening mammogram for malignant neoplasm of breast: Secondary | ICD-10-CM | POA: Diagnosis present

## 2014-11-30 ENCOUNTER — Encounter (INDEPENDENT_AMBULATORY_CARE_PROVIDER_SITE_OTHER): Payer: Self-pay | Admitting: *Deleted

## 2014-12-10 ENCOUNTER — Other Ambulatory Visit (INDEPENDENT_AMBULATORY_CARE_PROVIDER_SITE_OTHER): Payer: Self-pay | Admitting: Internal Medicine

## 2014-12-10 ENCOUNTER — Telehealth (INDEPENDENT_AMBULATORY_CARE_PROVIDER_SITE_OTHER): Payer: Self-pay | Admitting: *Deleted

## 2014-12-10 DIAGNOSIS — K862 Cyst of pancreas: Secondary | ICD-10-CM

## 2014-12-10 NOTE — Telephone Encounter (Signed)
MR abd sch'd 12/25/14 at 845, patient aware

## 2014-12-10 NOTE — Telephone Encounter (Signed)
I have put an order in for MRI abdomen with contrast

## 2014-12-10 NOTE — Telephone Encounter (Signed)
Patient is on recall for MRI abdomen with contrast for surveillance of pancreatic cyst, per your recommendation from last one (05/2014) -- please put order in so I can do PA and schedule, thanks

## 2014-12-25 ENCOUNTER — Ambulatory Visit (HOSPITAL_COMMUNITY)
Admission: RE | Admit: 2014-12-25 | Discharge: 2014-12-25 | Disposition: A | Discharge status: Home / Self Care | Payer: BLUE CROSS/BLUE SHIELD | Source: Ambulatory Visit | Attending: Internal Medicine | Admitting: Internal Medicine

## 2014-12-25 ENCOUNTER — Other Ambulatory Visit (INDEPENDENT_AMBULATORY_CARE_PROVIDER_SITE_OTHER): Payer: Self-pay | Admitting: Internal Medicine

## 2014-12-25 DIAGNOSIS — K760 Fatty (change of) liver, not elsewhere classified: Secondary | ICD-10-CM | POA: Insufficient documentation

## 2014-12-25 DIAGNOSIS — K862 Cyst of pancreas: Secondary | ICD-10-CM

## 2014-12-25 DIAGNOSIS — K868 Other specified diseases of pancreas: Secondary | ICD-10-CM | POA: Diagnosis present

## 2014-12-25 LAB — POCT I-STAT CREATININE: Creatinine, Ser: 0.8 mg/dL (ref 0.44–1.00)

## 2014-12-25 MED ORDER — GADOBENATE DIMEGLUMINE 529 MG/ML IV SOLN
14.0000 mL | Freq: Once | INTRAVENOUS | Status: AC | PRN
  Administered 2014-12-25: 14 mL via INTRAVENOUS

## 2015-07-08 ENCOUNTER — Encounter (INDEPENDENT_AMBULATORY_CARE_PROVIDER_SITE_OTHER): Payer: Self-pay | Admitting: *Deleted

## 2015-07-15 ENCOUNTER — Telehealth (INDEPENDENT_AMBULATORY_CARE_PROVIDER_SITE_OTHER): Payer: Self-pay | Admitting: Internal Medicine

## 2015-07-15 DIAGNOSIS — K8689 Other specified diseases of pancreas: Secondary | ICD-10-CM

## 2015-07-15 NOTE — Telephone Encounter (Signed)
MRI abdomen ordered  

## 2015-07-19 NOTE — Addendum Note (Signed)
Addended by: Butch Penny on: 07/19/2015 08:14 AM   Modules accepted: Orders

## 2015-07-19 NOTE — Telephone Encounter (Signed)
MRI abdomen ordered  

## 2015-07-26 DIAGNOSIS — Z01419 Encounter for gynecological examination (general) (routine) without abnormal findings: Secondary | ICD-10-CM | POA: Diagnosis not present

## 2015-07-26 DIAGNOSIS — Z6825 Body mass index (BMI) 25.0-25.9, adult: Secondary | ICD-10-CM | POA: Diagnosis not present

## 2015-07-26 DIAGNOSIS — Z1272 Encounter for screening for malignant neoplasm of vagina: Secondary | ICD-10-CM | POA: Diagnosis not present

## 2015-07-27 ENCOUNTER — Other Ambulatory Visit (INDEPENDENT_AMBULATORY_CARE_PROVIDER_SITE_OTHER): Payer: Self-pay | Admitting: Internal Medicine

## 2015-07-27 ENCOUNTER — Ambulatory Visit (HOSPITAL_COMMUNITY)
Admission: RE | Admit: 2015-07-27 | Discharge: 2015-07-27 | Disposition: A | Discharge status: Home / Self Care | Payer: Medicare HMO | Source: Ambulatory Visit | Attending: Internal Medicine | Admitting: Internal Medicine

## 2015-07-27 DIAGNOSIS — K8689 Other specified diseases of pancreas: Secondary | ICD-10-CM

## 2015-07-27 DIAGNOSIS — K862 Cyst of pancreas: Secondary | ICD-10-CM | POA: Insufficient documentation

## 2015-07-27 DIAGNOSIS — K869 Disease of pancreas, unspecified: Secondary | ICD-10-CM | POA: Insufficient documentation

## 2015-07-27 DIAGNOSIS — R935 Abnormal findings on diagnostic imaging of other abdominal regions, including retroperitoneum: Secondary | ICD-10-CM | POA: Diagnosis not present

## 2015-07-27 LAB — POCT I-STAT CREATININE: Creatinine, Ser: 0.9 mg/dL (ref 0.44–1.00)

## 2015-07-27 MED ORDER — GADOBENATE DIMEGLUMINE 529 MG/ML IV SOLN
13.0000 mL | Freq: Once | INTRAVENOUS | Status: AC | PRN
  Administered 2015-07-27: 13 mL via INTRAVENOUS

## 2015-08-10 DIAGNOSIS — E119 Type 2 diabetes mellitus without complications: Secondary | ICD-10-CM | POA: Diagnosis not present

## 2015-08-10 DIAGNOSIS — I10 Essential (primary) hypertension: Secondary | ICD-10-CM | POA: Diagnosis not present

## 2015-09-24 ENCOUNTER — Other Ambulatory Visit (HOSPITAL_COMMUNITY): Payer: Self-pay | Admitting: Unknown Physician Specialty

## 2015-09-24 DIAGNOSIS — Z1231 Encounter for screening mammogram for malignant neoplasm of breast: Secondary | ICD-10-CM

## 2015-10-20 ENCOUNTER — Ambulatory Visit (HOSPITAL_COMMUNITY): Payer: Medicare HMO

## 2015-10-21 ENCOUNTER — Ambulatory Visit (HOSPITAL_COMMUNITY)
Admission: RE | Admit: 2015-10-21 | Discharge: 2015-10-21 | Disposition: A | Discharge status: Home / Self Care | Payer: Medicare HMO | Source: Ambulatory Visit | Attending: Unknown Physician Specialty | Admitting: Unknown Physician Specialty

## 2015-10-21 DIAGNOSIS — Z1231 Encounter for screening mammogram for malignant neoplasm of breast: Secondary | ICD-10-CM | POA: Insufficient documentation

## 2015-10-21 DIAGNOSIS — R928 Other abnormal and inconclusive findings on diagnostic imaging of breast: Secondary | ICD-10-CM | POA: Insufficient documentation

## 2015-10-27 ENCOUNTER — Other Ambulatory Visit: Payer: Self-pay | Admitting: Unknown Physician Specialty

## 2015-10-27 DIAGNOSIS — R928 Other abnormal and inconclusive findings on diagnostic imaging of breast: Secondary | ICD-10-CM

## 2015-11-03 DIAGNOSIS — R7309 Other abnormal glucose: Secondary | ICD-10-CM | POA: Diagnosis not present

## 2015-11-03 DIAGNOSIS — E119 Type 2 diabetes mellitus without complications: Secondary | ICD-10-CM | POA: Diagnosis not present

## 2015-11-03 DIAGNOSIS — I10 Essential (primary) hypertension: Secondary | ICD-10-CM | POA: Diagnosis not present

## 2015-11-04 ENCOUNTER — Ambulatory Visit
Admission: RE | Admit: 2015-11-04 | Discharge: 2015-11-04 | Disposition: A | Discharge status: Home / Self Care | Payer: Medicare HMO | Source: Ambulatory Visit | Attending: Unknown Physician Specialty | Admitting: Unknown Physician Specialty

## 2015-11-04 ENCOUNTER — Other Ambulatory Visit: Payer: Self-pay

## 2015-11-04 DIAGNOSIS — R928 Other abnormal and inconclusive findings on diagnostic imaging of breast: Secondary | ICD-10-CM

## 2015-11-04 DIAGNOSIS — R922 Inconclusive mammogram: Secondary | ICD-10-CM | POA: Diagnosis not present

## 2015-11-09 ENCOUNTER — Encounter (HOSPITAL_COMMUNITY): Payer: Medicare HMO

## 2015-11-09 DIAGNOSIS — E119 Type 2 diabetes mellitus without complications: Secondary | ICD-10-CM | POA: Diagnosis not present

## 2015-11-09 DIAGNOSIS — I10 Essential (primary) hypertension: Secondary | ICD-10-CM | POA: Diagnosis not present

## 2016-02-07 DIAGNOSIS — R7309 Other abnormal glucose: Secondary | ICD-10-CM | POA: Diagnosis not present

## 2016-02-07 DIAGNOSIS — I10 Essential (primary) hypertension: Secondary | ICD-10-CM | POA: Diagnosis not present

## 2016-02-10 ENCOUNTER — Other Ambulatory Visit (HOSPITAL_COMMUNITY): Payer: Self-pay | Admitting: Pulmonary Disease

## 2016-02-10 DIAGNOSIS — Z780 Asymptomatic menopausal state: Secondary | ICD-10-CM

## 2016-02-10 DIAGNOSIS — Z0000 Encounter for general adult medical examination without abnormal findings: Secondary | ICD-10-CM | POA: Diagnosis not present

## 2016-02-14 DIAGNOSIS — R69 Illness, unspecified: Secondary | ICD-10-CM | POA: Diagnosis not present

## 2016-02-23 ENCOUNTER — Ambulatory Visit (HOSPITAL_COMMUNITY)
Admission: RE | Admit: 2016-02-23 | Discharge: 2016-02-23 | Disposition: A | Discharge status: Home / Self Care | Payer: Medicare HMO | Source: Ambulatory Visit | Attending: Pulmonary Disease | Admitting: Pulmonary Disease

## 2016-02-23 DIAGNOSIS — M85851 Other specified disorders of bone density and structure, right thigh: Secondary | ICD-10-CM | POA: Insufficient documentation

## 2016-02-23 DIAGNOSIS — M8588 Other specified disorders of bone density and structure, other site: Secondary | ICD-10-CM | POA: Diagnosis not present

## 2016-02-23 DIAGNOSIS — Z780 Asymptomatic menopausal state: Secondary | ICD-10-CM | POA: Diagnosis not present

## 2016-02-23 DIAGNOSIS — N951 Menopausal and female climacteric states: Secondary | ICD-10-CM | POA: Diagnosis present

## 2016-03-15 DIAGNOSIS — Z1211 Encounter for screening for malignant neoplasm of colon: Secondary | ICD-10-CM | POA: Diagnosis not present

## 2016-05-08 DIAGNOSIS — R7309 Other abnormal glucose: Secondary | ICD-10-CM | POA: Diagnosis not present

## 2016-05-08 DIAGNOSIS — I10 Essential (primary) hypertension: Secondary | ICD-10-CM | POA: Diagnosis not present

## 2016-05-08 DIAGNOSIS — E119 Type 2 diabetes mellitus without complications: Secondary | ICD-10-CM | POA: Diagnosis not present

## 2016-05-12 DIAGNOSIS — E119 Type 2 diabetes mellitus without complications: Secondary | ICD-10-CM | POA: Diagnosis not present

## 2016-05-12 DIAGNOSIS — I10 Essential (primary) hypertension: Secondary | ICD-10-CM | POA: Diagnosis not present

## 2016-05-12 DIAGNOSIS — S99921A Unspecified injury of right foot, initial encounter: Secondary | ICD-10-CM | POA: Diagnosis not present

## 2016-07-25 ENCOUNTER — Encounter (INDEPENDENT_AMBULATORY_CARE_PROVIDER_SITE_OTHER): Payer: Self-pay | Admitting: *Deleted

## 2016-08-01 ENCOUNTER — Telehealth (INDEPENDENT_AMBULATORY_CARE_PROVIDER_SITE_OTHER): Payer: Self-pay | Admitting: Internal Medicine

## 2016-08-01 ENCOUNTER — Telehealth (INDEPENDENT_AMBULATORY_CARE_PROVIDER_SITE_OTHER): Payer: Self-pay | Admitting: *Deleted

## 2016-08-01 DIAGNOSIS — K862 Cyst of pancreas: Secondary | ICD-10-CM

## 2016-08-01 NOTE — Telephone Encounter (Signed)
Patient is on recall for 1 yr MRI abd w wo, pancreatic cyst -- need orde rplease

## 2016-08-01 NOTE — Telephone Encounter (Signed)
MRI oradered

## 2016-08-07 DIAGNOSIS — R7309 Other abnormal glucose: Secondary | ICD-10-CM | POA: Diagnosis not present

## 2016-08-07 DIAGNOSIS — I10 Essential (primary) hypertension: Secondary | ICD-10-CM | POA: Diagnosis not present

## 2016-08-07 DIAGNOSIS — Z0000 Encounter for general adult medical examination without abnormal findings: Secondary | ICD-10-CM | POA: Diagnosis not present

## 2016-08-10 DIAGNOSIS — Z0000 Encounter for general adult medical examination without abnormal findings: Secondary | ICD-10-CM | POA: Diagnosis not present

## 2016-08-11 DIAGNOSIS — Z23 Encounter for immunization: Secondary | ICD-10-CM | POA: Diagnosis not present

## 2016-08-14 ENCOUNTER — Ambulatory Visit (HOSPITAL_COMMUNITY)
Admission: RE | Admit: 2016-08-14 | Discharge: 2016-08-14 | Disposition: A | Discharge status: Home / Self Care | Payer: Medicare HMO | Source: Ambulatory Visit | Attending: Internal Medicine | Admitting: Internal Medicine

## 2016-08-14 ENCOUNTER — Other Ambulatory Visit (INDEPENDENT_AMBULATORY_CARE_PROVIDER_SITE_OTHER): Payer: Self-pay | Admitting: Internal Medicine

## 2016-08-14 DIAGNOSIS — K862 Cyst of pancreas: Secondary | ICD-10-CM | POA: Insufficient documentation

## 2016-08-14 DIAGNOSIS — R935 Abnormal findings on diagnostic imaging of other abdominal regions, including retroperitoneum: Secondary | ICD-10-CM | POA: Diagnosis not present

## 2016-08-14 LAB — POCT I-STAT CREATININE: Creatinine, Ser: 0.9 mg/dL (ref 0.44–1.00)

## 2016-08-14 MED ORDER — GADOBENATE DIMEGLUMINE 529 MG/ML IV SOLN
13.0000 mL | Freq: Once | INTRAVENOUS | Status: AC | PRN
  Administered 2016-08-14: 13 mL via INTRAVENOUS

## 2016-08-23 DIAGNOSIS — Z1211 Encounter for screening for malignant neoplasm of colon: Secondary | ICD-10-CM | POA: Diagnosis not present

## 2016-09-25 ENCOUNTER — Other Ambulatory Visit (HOSPITAL_COMMUNITY): Payer: Self-pay | Admitting: Unknown Physician Specialty

## 2016-09-25 DIAGNOSIS — Z1231 Encounter for screening mammogram for malignant neoplasm of breast: Secondary | ICD-10-CM

## 2016-11-06 ENCOUNTER — Ambulatory Visit (HOSPITAL_COMMUNITY): Payer: Medicare HMO

## 2016-11-06 DIAGNOSIS — R7309 Other abnormal glucose: Secondary | ICD-10-CM | POA: Diagnosis not present

## 2016-11-09 DIAGNOSIS — E119 Type 2 diabetes mellitus without complications: Secondary | ICD-10-CM | POA: Diagnosis not present

## 2016-11-09 DIAGNOSIS — I10 Essential (primary) hypertension: Secondary | ICD-10-CM | POA: Diagnosis not present

## 2016-11-10 ENCOUNTER — Ambulatory Visit (HOSPITAL_COMMUNITY)
Admission: RE | Admit: 2016-11-10 | Discharge: 2016-11-10 | Disposition: A | Discharge status: Home / Self Care | Payer: Medicare HMO | Source: Ambulatory Visit | Attending: Unknown Physician Specialty | Admitting: Unknown Physician Specialty

## 2016-11-10 DIAGNOSIS — Z1231 Encounter for screening mammogram for malignant neoplasm of breast: Secondary | ICD-10-CM | POA: Diagnosis not present

## 2017-01-26 DIAGNOSIS — R69 Illness, unspecified: Secondary | ICD-10-CM | POA: Diagnosis not present

## 2017-02-07 DIAGNOSIS — E1165 Type 2 diabetes mellitus with hyperglycemia: Secondary | ICD-10-CM | POA: Diagnosis not present

## 2017-02-12 DIAGNOSIS — I10 Essential (primary) hypertension: Secondary | ICD-10-CM | POA: Diagnosis not present

## 2017-02-12 DIAGNOSIS — E119 Type 2 diabetes mellitus without complications: Secondary | ICD-10-CM | POA: Diagnosis not present

## 2017-05-03 ENCOUNTER — Ambulatory Visit (HOSPITAL_COMMUNITY)
Admission: RE | Admit: 2017-05-03 | Discharge: 2017-05-03 | Disposition: A | Discharge status: Home / Self Care | Payer: Medicare HMO | Source: Ambulatory Visit | Attending: Pulmonary Disease | Admitting: Pulmonary Disease

## 2017-05-03 ENCOUNTER — Other Ambulatory Visit (HOSPITAL_COMMUNITY): Payer: Self-pay | Admitting: Pulmonary Disease

## 2017-05-03 DIAGNOSIS — E119 Type 2 diabetes mellitus without complications: Secondary | ICD-10-CM | POA: Diagnosis not present

## 2017-05-03 DIAGNOSIS — M5431 Sciatica, right side: Secondary | ICD-10-CM | POA: Insufficient documentation

## 2017-05-03 DIAGNOSIS — M545 Low back pain: Secondary | ICD-10-CM | POA: Diagnosis not present

## 2017-05-03 DIAGNOSIS — I10 Essential (primary) hypertension: Secondary | ICD-10-CM | POA: Diagnosis not present

## 2017-05-11 DIAGNOSIS — E119 Type 2 diabetes mellitus without complications: Secondary | ICD-10-CM | POA: Diagnosis not present

## 2017-05-11 DIAGNOSIS — E1165 Type 2 diabetes mellitus with hyperglycemia: Secondary | ICD-10-CM | POA: Diagnosis not present

## 2017-05-11 DIAGNOSIS — I10 Essential (primary) hypertension: Secondary | ICD-10-CM | POA: Diagnosis not present

## 2017-05-15 DIAGNOSIS — M5431 Sciatica, right side: Secondary | ICD-10-CM | POA: Diagnosis not present

## 2017-05-15 DIAGNOSIS — I10 Essential (primary) hypertension: Secondary | ICD-10-CM | POA: Diagnosis not present

## 2017-05-15 DIAGNOSIS — E119 Type 2 diabetes mellitus without complications: Secondary | ICD-10-CM | POA: Diagnosis not present

## 2017-07-26 DIAGNOSIS — Z1272 Encounter for screening for malignant neoplasm of vagina: Secondary | ICD-10-CM | POA: Diagnosis not present

## 2017-07-26 DIAGNOSIS — R102 Pelvic and perineal pain: Secondary | ICD-10-CM | POA: Diagnosis not present

## 2017-07-26 DIAGNOSIS — Z01419 Encounter for gynecological examination (general) (routine) without abnormal findings: Secondary | ICD-10-CM | POA: Diagnosis not present

## 2017-07-30 DIAGNOSIS — N83292 Other ovarian cyst, left side: Secondary | ICD-10-CM | POA: Diagnosis not present

## 2017-07-30 DIAGNOSIS — Z90710 Acquired absence of both cervix and uterus: Secondary | ICD-10-CM | POA: Diagnosis not present

## 2017-07-30 DIAGNOSIS — R1909 Other intra-abdominal and pelvic swelling, mass and lump: Secondary | ICD-10-CM | POA: Diagnosis not present

## 2017-07-30 DIAGNOSIS — R102 Pelvic and perineal pain: Secondary | ICD-10-CM | POA: Diagnosis not present

## 2017-07-30 DIAGNOSIS — N949 Unspecified condition associated with female genital organs and menstrual cycle: Secondary | ICD-10-CM | POA: Diagnosis not present

## 2017-07-30 DIAGNOSIS — N838 Other noninflammatory disorders of ovary, fallopian tube and broad ligament: Secondary | ICD-10-CM | POA: Diagnosis not present

## 2017-07-30 DIAGNOSIS — N83201 Unspecified ovarian cyst, right side: Secondary | ICD-10-CM | POA: Diagnosis not present

## 2017-07-30 DIAGNOSIS — N83291 Other ovarian cyst, right side: Secondary | ICD-10-CM | POA: Diagnosis not present

## 2017-07-30 DIAGNOSIS — N83202 Unspecified ovarian cyst, left side: Secondary | ICD-10-CM | POA: Diagnosis not present

## 2017-08-01 DIAGNOSIS — Z01812 Encounter for preprocedural laboratory examination: Secondary | ICD-10-CM | POA: Diagnosis not present

## 2017-08-02 DIAGNOSIS — R1909 Other intra-abdominal and pelvic swelling, mass and lump: Secondary | ICD-10-CM | POA: Diagnosis not present

## 2017-08-02 DIAGNOSIS — K760 Fatty (change of) liver, not elsewhere classified: Secondary | ICD-10-CM | POA: Diagnosis not present

## 2017-08-02 DIAGNOSIS — C562 Malignant neoplasm of left ovary: Secondary | ICD-10-CM | POA: Diagnosis not present

## 2017-08-02 DIAGNOSIS — N838 Other noninflammatory disorders of ovary, fallopian tube and broad ligament: Secondary | ICD-10-CM | POA: Diagnosis not present

## 2017-08-02 DIAGNOSIS — C561 Malignant neoplasm of right ovary: Secondary | ICD-10-CM | POA: Diagnosis not present

## 2017-08-03 ENCOUNTER — Inpatient Hospital Stay: Payer: Medicare HMO | Attending: Obstetrics | Admitting: Obstetrics

## 2017-08-03 ENCOUNTER — Encounter: Payer: Self-pay | Admitting: Obstetrics

## 2017-08-03 ENCOUNTER — Telehealth: Payer: Self-pay | Admitting: *Deleted

## 2017-08-03 ENCOUNTER — Other Ambulatory Visit: Payer: Medicare HMO

## 2017-08-03 VITALS — BP 148/76 | HR 76 | Temp 97.7°F | Resp 18 | Ht 65.0 in | Wt 164.6 lb

## 2017-08-03 DIAGNOSIS — R971 Elevated cancer antigen 125 [CA 125]: Secondary | ICD-10-CM

## 2017-08-03 DIAGNOSIS — Z90710 Acquired absence of both cervix and uterus: Secondary | ICD-10-CM | POA: Diagnosis not present

## 2017-08-03 DIAGNOSIS — R102 Pelvic and perineal pain: Secondary | ICD-10-CM | POA: Diagnosis not present

## 2017-08-03 DIAGNOSIS — R1900 Intra-abdominal and pelvic swelling, mass and lump, unspecified site: Secondary | ICD-10-CM

## 2017-08-03 DIAGNOSIS — R1907 Generalized intra-abdominal and pelvic swelling, mass and lump: Secondary | ICD-10-CM | POA: Diagnosis not present

## 2017-08-03 DIAGNOSIS — R935 Abnormal findings on diagnostic imaging of other abdominal regions, including retroperitoneum: Secondary | ICD-10-CM

## 2017-08-03 DIAGNOSIS — R590 Localized enlarged lymph nodes: Secondary | ICD-10-CM | POA: Insufficient documentation

## 2017-08-03 DIAGNOSIS — K668 Other specified disorders of peritoneum: Secondary | ICD-10-CM

## 2017-08-03 DIAGNOSIS — N9489 Other specified conditions associated with female genital organs and menstrual cycle: Secondary | ICD-10-CM

## 2017-08-03 NOTE — Telephone Encounter (Signed)
Open by [employer]

## 2017-08-03 NOTE — Progress Notes (Signed)
Consult Note: Gyn-Onc  Consult was requested by Dr. Evie Lacks for the evaluation of Sabrina Vang 67 y.o. female  CC:  Chief Complaint  Patient presents with  . Adnexal mass  . Elevated CA-125    HPI: Sabrina Vang  is a very nice 67 y.o.  P2  She presented to her gynecologist for her annual exam and noted symptoms of bloating along with decreased appetite however increased weight.  On physical exam her gynecologist noted a palpable mass and imaging was ordered including ultrasound.  Ultrasound revealed a complex cystic mass in the right adnexa measuring 20 x 11 x 12 cm with multiple internal septations some of which are thick.  The left adnexa measured 12.7 x 11.6 x 8.1 with low level echoes and soft tissue nodules.  This was 07/30/2017.  The same day she had a Ca125 equal to 521.3.  At some point a provider ordered an MRI of the abdomen and pelvis with and without contrast the lower chest revealed several prominent lower anterior mediastinal lymph nodes including a pericardial measuring 12 mm in short axis.  Hepatic steatosis.  Surface of the liver is noted to have several soft tissue areas of thickening and enhancement suspicious for serosal implants.  Peritoneal disease is expected in the upper abdomen adjacent to the liver and transverse colon.  Extensive omental and peritoneal implants are appreciated in the left lower quadrant.  She does have an abdominal MRI in 2018 for pancreatic cyst no abdominal pelvic mass or peritoneal disease was noted at that time.  Looking back she thinks that over the past 6 months she has noted she has just not been feeling her usual self with decreased energy and occasional discomfort in the abdomen.  Again she notes a weight gain despite a decreased appetite.  She describes her pain as being vaginal and abdominal 5 out of 10 sharp to dull it is brief but aggravated by sitting she alleviates it with anti-inflammatories.  She does also note bloating increased  symptoms of reflux and urinary urgency.  Her bowel movements do not seem to be affected she is having daily bowel movements and denies persistent nausea and vomiting she has had some nausea which occurs about twice per week.  On a side note she was a widow and recently married a new gentleman who was unable to accompany her today due to illness, however her sister-in-law (married to her brother) is present during today's visit.  Measurement of disease:  No results for input(s): CA125, CAN125, CEA, CA199, ESTRADIOL, INHBB in the last 8760 hours.  Invalid input(s): INHIBINA  . 07/30/2017-Ca125-  521.3 performed at [place]   Radiology:   08/02/2017-MRI of the abdomen and pelvis with and without contrast -performed in [place], [place] -  the lower chest revealed several prominent lower anterior mediastinal lymph nodes including a pericardial measuring 12 mm in short axis.  Hepatic steatosis.  Surface of the liver is noted to have several soft tissue areas of thickening and enhancement suspicious for serosal implants.  Peritoneal disease is expected in the upper abdomen adjacent to the liver and transverse colon.  Extensive omental and peritoneal implants are appreciated in the left lower quadrant.     Oncologic History: Pending further workup    No history exists.    Current Meds:  Outpatient Encounter Medications as of 08/03/2017  Medication Sig  . aspirin EC 81 MG tablet Take 81 mg by mouth daily.  Marland Kitchen atenolol (TENORMIN) 25 MG  tablet Take 25 mg by mouth at bedtime.   . metFORMIN (GLUCOPHAGE) 1000 MG tablet Take 500 mg by mouth 2 (two) times daily.  . Multiple Vitamin (MULTIVITAMIN WITH MINERALS) TABS tablet Take 1 tablet by mouth daily.  . naproxen sodium (ALEVE) 220 MG tablet Take 220 mg by mouth daily as needed (for pain).  . [DISCONTINUED] fluconazole (DIFLUCAN) 100 MG tablet Take 1 tablet (100 mg total) by mouth daily.  . [DISCONTINUED] oxyCODONE-acetaminophen (PERCOCET) 7.5-325  MG per tablet Take 1-2 tablets by mouth every 4 (four) hours as needed.   No facility-administered encounter medications on file as of 08/03/2017.     Allergy: No Known Allergies  Social Hx:   Social History   Socioeconomic History  . Marital status: Widowed    Spouse name: Not on file  . Number of children: Not on file  . Years of education: Not on file  . Highest education level: Not on file  Occupational History  . Not on file  Social Needs  . Financial resource strain: Not on file  . Food insecurity:    Worry: Not on file    Inability: Not on file  . Transportation needs:    Medical: Not on file    Non-medical: Not on file  Tobacco Use  . Smoking status: Never Smoker  . Smokeless tobacco: Never Used  Substance and Sexual Activity  . Alcohol use: Yes    Alcohol/week: 2.5 oz    Types: 5 Standard drinks or equivalent per week    Comment: occ  . Drug use: No  . Sexual activity: Never    Birth control/protection: Abstinence  Lifestyle  . Physical activity:    Days per week: Not on file    Minutes per session: Not on file  . Stress: Not on file  Relationships  . Social connections:    Talks on phone: Not on file    Gets together: Not on file    Attends religious service: Not on file    Active member of club or organization: Not on file    Attends meetings of clubs or organizations: Not on file    Relationship status: Not on file  . Intimate partner violence:    Fear of current or ex partner: Not on file    Emotionally abused: Not on file    Physically abused: Not on file    Forced sexual activity: Not on file  Other Topics Concern  . Not on file  Social History Narrative  . Not on file    Past Surgical Hx:  Past Surgical History:  Procedure Laterality Date  . CHOLECYSTECTOMY N/A 07/06/2014   Procedure: LAPAROSCOPIC CHOLECYSTECTOMY;  Surgeon: Aviva Signs Md, MD;  Location: [facility];  Service: General;  Laterality: N/A;  . COLONOSCOPY N/A 06/26/2013    Procedure: COLONOSCOPY;  Surgeon: Rogene Houston, MD;  Location: [facility];  Service: Endoscopy;  Laterality: N/A;  1030  . VAGINAL HYSTERECTOMY  2011    Past Medical Hx:  Past Medical History:  Diagnosis Date  . Diabetes mellitus without complication ([place])   . Diverticulosis   . Hypertension   . Osteopenia   . Pancreatic cyst     Past Gynecological History:   GYNECOLOGIC HISTORY:  No LMP recorded. Patient has had a hysterectomy. Menarche: 67 years old P 12 LMP 68 years old Contraceptive; yes, <10 years HRT <1 year  Last Pap s/p vaginal hysterectomy 2011 for "prolapse"  Family Hx:  Family History  Problem Relation Age of Onset  . Colon cancer Mother   . Skin cancer Father 62       unsure type  . Cancer Maternal Grandmother 101       unknown cancer  . Brain cancer Maternal Grandfather 70    Review of Systems:  Review of Systems  Constitutional: Negative for weight loss.  HENT: Negative.   Eyes: Negative.   Respiratory: Negative.   Cardiovascular: Negative.   Gastrointestinal: Positive for abdominal pain and heartburn.  Genitourinary: Positive for urgency.  Skin: Negative.   Neurological: Negative.   Endo/Heme/Allergies: Negative.   Psychiatric/Behavioral: Negative.    Gynecologic complaints include pelvic pain  Vitals:  Blood pressure (!) 148/76, pulse 76, temperature 97.7 F (36.5 C), temperature source Oral, resp. rate 18, height 5\' 5"  (1.651 m), weight 164 lb 9.6 oz (74.7 kg), SpO2 100 %. Body mass index is 27.39 kg/m.   Physical Exam: ECOG PERFORMANCE STATUS: 1 - Symptomatic but completely ambulatory   General :  Well developed, 67 y.o., female in no apparent distress HEENT:  Normocephalic/atraumatic, symmetric, EOMI, eyelids normal Neck:   Supple, no masses.  Lymphatics:  No cervical/ submandibular/ supraclavicular/ infraclavicular/ inguinal adenopathy Respiratory:  Respirations unlabored, no use of accessory muscles CV:   Deferred Breast:   Deferred Musculoskeletal: No CVA tenderness, normal muscle strength. Abdomen:  Soft, non-tender. Palpable mass at level of umbilicus. No evidence of hernia.  Extremities:  No lymphedema, no erythema, non-tender. Skin:   Normal inspection Neuro/Psych:  No focal motor deficit, no abnormal mental status. Normal gait. Normal affect. Alert and oriented to person, place, and time  Genito Urinary: Vulva: Normal external female genitalia.  Bladder/urethra: Urethral meatus normal in size and location. No lesions or masses, well supported bladder Speculum exam: Vagina: No lesion, no discharge, no bleeding. Cervix: Surgically absent Bimanual exam: There is a surgically absent there is palpable fullness but no specific nodularity Uterus: Absent Rectovaginal:  Good tone, fullness in the cul-de-sac but no cul de sac nodularity, no parametrial involvement or nodularity.  The rectal wall feels smooth but there is some mild compression anteriorly from the mass in the pelvis which is mildly mobile.  This compression starts at approximately 7-8 cm in from the anal verge.  Oncologic Summary: 1. Pelvic mass worrisome for ovarian cancer 2. Elevated Ca1 25 3. Question cardiophrenic lymphadenopathy 4. Suspect peritoneal disease   Assessment/Plan: 1. Today we discussed the concern for malignancy including the abnormal imaging and elevated Ca1 25 2. The MRI was reviewed by me personally and discussed with 2 radiologists today. I also showed the patient the pertinent images in my planning of care with her. 3. Given the possible extra-abdominal disease if this is ovarian cancer surgery would be considered suboptimal in the upfront setting o Therefore I recommended tissue diagnosis and possible neoadjuvant chemotherapy 4. Additional imaging including CT and PET will be requested as MRI is limited in its assessment of the peritoneum. 5. In anticipation for the biopsy and possible surgery of asked her to hold her  aspirin 6. Return to clinic pending above/ASAP o She did ask me about undergoing treatment closer to home however given we may be taking a neoadjuvant approach with intervening surgery timing and continuity of care with this office would be the most ideal scenario.  Ultimately the decision is hers and we will see how she wants to proceed going forward on her return visit. o If we obtain tissue diagnosis prior to her scheduled appointment for follow-up  I will try to get her in with medical oncology to begin the discussion for chemotherapy 7. We did briefly touch on prognosis and typical treatment planning and expectations with chemotherapy.  She will likely need to re-review these with each visit during these initial planning stages. 8. She is a very kind soul and we will do our best for her going forward.    Isabel Caprice, MD  08/03/2017, 6:23 PM  Cc: Sinda Du, Linna Hoff, [place] (PCP) Gari Crown, MD (Referring OB/Gyn)

## 2017-08-03 NOTE — Patient Instructions (Signed)
1. We are planning a biopsy to be performed in the next several days. 2. Imaging including a CTscan and a PET will be ordered. 3. Return to review the above with Dr. Gerarda Fraction 4. We may refer you to Medical Oncology before your return with Dr. Gerarda Fraction

## 2017-08-08 ENCOUNTER — Other Ambulatory Visit: Payer: Self-pay | Admitting: Gynecologic Oncology

## 2017-08-08 DIAGNOSIS — R1900 Intra-abdominal and pelvic swelling, mass and lump, unspecified site: Secondary | ICD-10-CM

## 2017-08-08 NOTE — Progress Notes (Signed)
US biopsy changed to CT biopsy

## 2017-08-13 ENCOUNTER — Telehealth: Payer: Self-pay | Admitting: *Deleted

## 2017-08-13 ENCOUNTER — Other Ambulatory Visit: Payer: Self-pay | Admitting: Gynecologic Oncology

## 2017-08-13 DIAGNOSIS — R1907 Generalized intra-abdominal and pelvic swelling, mass and lump: Secondary | ICD-10-CM

## 2017-08-13 NOTE — Telephone Encounter (Signed)
Called and set up a peer to peer for tomorrow at 12:30pm.

## 2017-08-13 NOTE — Telephone Encounter (Addendum)
Called and moved the appt on April 24th to May 1st per Dr. Gerarda Fraction. Patient also stated that "I called [facility] and was told that the CT abdomen and pelvis was approved with the number #[id number].

## 2017-08-14 ENCOUNTER — Ambulatory Visit (HOSPITAL_COMMUNITY): Admission: RE | Admit: 2017-08-14 | Payer: Medicare HMO | Source: Ambulatory Visit

## 2017-08-14 ENCOUNTER — Ambulatory Visit (HOSPITAL_COMMUNITY): Payer: Medicare HMO

## 2017-08-15 ENCOUNTER — Ambulatory Visit: Payer: Medicare HMO | Admitting: Obstetrics

## 2017-08-15 ENCOUNTER — Telehealth: Payer: Self-pay | Admitting: Gynecologic Oncology

## 2017-08-15 NOTE — Telephone Encounter (Signed)
Called patient.  Discussed all upcoming appointments.  All questions answered.  Advised to call for any needs or concerns.

## 2017-08-16 ENCOUNTER — Other Ambulatory Visit: Payer: Self-pay | Admitting: Radiology

## 2017-08-16 ENCOUNTER — Other Ambulatory Visit: Payer: Self-pay | Admitting: Student

## 2017-08-17 ENCOUNTER — Encounter (HOSPITAL_COMMUNITY): Payer: Self-pay

## 2017-08-17 ENCOUNTER — Telehealth: Payer: Self-pay

## 2017-08-17 ENCOUNTER — Ambulatory Visit (HOSPITAL_COMMUNITY)
Admission: RE | Admit: 2017-08-17 | Discharge: 2017-08-17 | Disposition: A | Discharge status: Home / Self Care | Payer: Medicare HMO | Source: Ambulatory Visit | Attending: Gynecologic Oncology | Admitting: Gynecologic Oncology

## 2017-08-17 DIAGNOSIS — C481 Malignant neoplasm of specified parts of peritoneum: Secondary | ICD-10-CM | POA: Diagnosis not present

## 2017-08-17 DIAGNOSIS — C786 Secondary malignant neoplasm of retroperitoneum and peritoneum: Secondary | ICD-10-CM | POA: Diagnosis not present

## 2017-08-17 DIAGNOSIS — R1900 Intra-abdominal and pelvic swelling, mass and lump, unspecified site: Secondary | ICD-10-CM

## 2017-08-17 DIAGNOSIS — R1909 Other intra-abdominal and pelvic swelling, mass and lump: Secondary | ICD-10-CM | POA: Diagnosis not present

## 2017-08-17 LAB — CBC
HEMATOCRIT: 41.2 % (ref 36.0–46.0)
HEMOGLOBIN: 13.3 g/dL (ref 12.0–15.0)
MCH: 29.3 pg (ref 26.0–34.0)
MCHC: 32.3 g/dL (ref 30.0–36.0)
MCV: 90.7 fL (ref 78.0–100.0)
Platelets: 256 10*3/uL (ref 150–400)
RBC: 4.54 MIL/uL (ref 3.87–5.11)
RDW: 13.7 % (ref 11.5–15.5)
WBC: 8.6 10*3/uL (ref 4.0–10.5)

## 2017-08-17 LAB — PROTIME-INR
INR: 0.98
Prothrombin Time: 12.9 s (ref 11.4–15.2)

## 2017-08-17 LAB — GLUCOSE, CAPILLARY: GLUCOSE-CAPILLARY: 136 mg/dL — AB (ref 65–99)

## 2017-08-17 MED ORDER — FENTANYL CITRATE (PF) 100 MCG/2ML IJ SOLN
INTRAMUSCULAR | Status: AC | PRN
  Administered 2017-08-17: 100 ug via INTRAVENOUS

## 2017-08-17 MED ORDER — HYDROCODONE-ACETAMINOPHEN 5-325 MG PO TABS: 1.0000 | ORAL_TABLET | ORAL | Status: DC | PRN

## 2017-08-17 MED ORDER — FENTANYL CITRATE (PF) 100 MCG/2ML IJ SOLN
INTRAMUSCULAR | Status: AC
  Filled 2017-08-17: qty 4

## 2017-08-17 MED ORDER — MIDAZOLAM HCL 2 MG/2ML IJ SOLN
INTRAMUSCULAR | Status: AC | PRN
  Administered 2017-08-17: 2 mg via INTRAVENOUS

## 2017-08-17 MED ORDER — LIDOCAINE HCL 1 % IJ SOLN
INTRAMUSCULAR | Status: AC
  Filled 2017-08-17: qty 20

## 2017-08-17 MED ORDER — MIDAZOLAM HCL 2 MG/2ML IJ SOLN
INTRAMUSCULAR | Status: AC
  Filled 2017-08-17: qty 4

## 2017-08-17 MED ORDER — SODIUM CHLORIDE 0.9 % IV SOLN: INTRAVENOUS | Status: DC

## 2017-08-17 NOTE — Consult Note (Addendum)
Chief Complaint: Patient was seen in consultation today for omental mass biopsy at the request of Cross,Melissa D  Referring Physician(s): Cross,Melissa D  Supervising Physician: Marybelle Killings  Patient Status: [facility] - Out-pt  History of Present Illness: Sabrina Vang is a 67 y.o. female   Bloating; increased girth Loss of appetite yet wt gain  US Revealed: IMPRESSION: Large bilateral complex cystic adnexal masses, highly suspicious for cystic ovarian neoplasms. Recommend correlation with CA 125 level, and consider pelvis MRI without and with contrast for further evaluation.  MRI 08/02/17 IMPRESSION: 1. Malignant-appearing adnexal lesions (right greater than left), with apparent intraperitoneal spread of disease as evidenced by multiple enhancing peritoneal nodules and masses, as well as probable serosal implants associated with the surface of the liver. Probable lymphadenopathy is also noted in the upper abdomen, as well as in the lower anterior mediastinum, as detailed above. 2. Severe hepatic steatosis. 3. Additional incidental findings, as above.  Now scheduled for omental mass biopsy   Past Medical History:  Diagnosis Date  . Diabetes mellitus without complication ([place])   . Diverticulosis   . Hypertension   . Osteopenia   . Pancreatic cyst     Past Surgical History:  Procedure Laterality Date  . CHOLECYSTECTOMY N/A 07/06/2014   Procedure: LAPAROSCOPIC CHOLECYSTECTOMY;  Surgeon: Aviva Signs Md, MD;  Location: [facility] ORS;  Service: General;  Laterality: N/A;  . COLONOSCOPY N/A 06/26/2013   Procedure: COLONOSCOPY;  Surgeon: Rogene Houston, MD;  Location: [facility] ENDO SUITE;  Service: Endoscopy;  Laterality: N/A;  1030  . VAGINAL HYSTERECTOMY  2011    Allergies: Patient has no known allergies.  Medications: Prior to Admission medications   Medication Sig Start Date End Date Taking? Authorizing Provider  aspirin EC 81 MG tablet Take 81 mg by mouth daily.   Yes  [provider]  atenolol (TENORMIN) 25 MG tablet Take 25 mg by mouth at bedtime.    Yes [provider]  CALCIUM-MAGNESIUM-VITAMIN D PO Take 1 tablet by mouth daily.   Yes [provider]  loratadine (CLARITIN) 10 MG tablet Take 10 mg by mouth daily as needed for allergies.   Yes [provider]  metFORMIN (GLUCOPHAGE) 1000 MG tablet Take 500 mg by mouth 2 (two) times daily. 05/21/14  Yes [provider]  Multiple Vitamin (MULTIVITAMIN WITH MINERALS) TABS tablet Take 1 tablet by mouth daily.   Yes [provider]  acetaminophen (TYLENOL) 500 MG tablet Take 500 mg by mouth every 8 (eight) hours as needed for mild pain or moderate pain.    [provider]     Family History  Problem Relation Age of Onset  . Colon cancer Mother   . Skin cancer Father 26       unsure type  . Cancer Maternal Grandmother 101       unknown cancer  . Brain cancer Maternal Grandfather 35    Social History   Socioeconomic History  . Marital status: Married    Spouse name: Not on file  . Number of children: Not on file  . Years of education: Not on file  . Highest education level: Not on file  Occupational History  . Not on file  Social Needs  . Financial resource strain: Not on file  . Food insecurity:    Worry: Not on file    Inability: Not on file  . Transportation needs:    Medical: Not on file    Non-medical: Not on file  Tobacco Use  . Smoking status: Never Smoker  . Smokeless tobacco: Never Used  Substance and Sexual Activity  . Alcohol use: Yes    Alcohol/week: 2.5 oz    Types: 5 Standard drinks or equivalent per week    Comment: occ  . Drug use: No  . Sexual activity: Yes    Birth control/protection: None  Lifestyle  . Physical activity:    Days per week: Not on file    Minutes per session: Not on file  . Stress: Not on file  Relationships  . Social connections:    Talks on phone: Not on file    Gets together: Not on  file    Attends religious service: Not on file    Active member of club or organization: Not on file    Attends meetings of clubs or organizations: Not on file    Relationship status: Not on file  Other Topics Concern  . Not on file  Social History Narrative  . Not on file    Review of Systems: A 12 point ROS discussed and pertinent positives are indicated in the HPI above.  All other systems are negative.  Review of Systems  Constitutional: Positive for activity change, appetite change and unexpected weight change. Negative for fever.  Respiratory: Negative for cough and shortness of breath.   Cardiovascular: Negative for chest pain.  Gastrointestinal: Positive for abdominal distention and abdominal pain.  Neurological: Negative for weakness.  Psychiatric/Behavioral: Negative for behavioral problems and confusion.    Vital Signs: BP 122/79   Pulse 73   Temp 98 F (36.7 C) (Oral)   Resp 16   Ht 5\' 5"  (1.651 m)   Wt 165 lb (74.8 kg)   SpO2 98%   BMI 27.46 kg/m   Physical Exam  Constitutional: She is oriented to person, place, and time.  Cardiovascular: Normal rate, regular rhythm and normal heart sounds.  Pulmonary/Chest: Effort normal and breath sounds normal.  Abdominal: Soft. Bowel sounds are normal. She exhibits distension. There is tenderness.  Musculoskeletal: Normal range of motion.  Neurological: She is alert and oriented to person, place, and time.  Skin: Skin is warm and dry.  Nursing note and vitals reviewed.   Imaging: No results found.  Labs:  CBC: No results for input(s): WBC, HGB, HCT, PLT in the last 8760 hours.  COAGS: No results for input(s): INR, APTT in the last 8760 hours.  BMP: No results for input(s): NA, K, CL, CO2, GLUCOSE, BUN, CALCIUM, CREATININE, GFRNONAA, GFRAA in the last 8760 hours.  Invalid input(s): CMP  LIVER FUNCTION TESTS: No results for input(s): BILITOT, AST, ALT, ALKPHOS, PROT, ALBUMIN in the last 8760 hours.  TUMOR  MARKERS: No results for input(s): AFPTM, CEA, CA199, CHROMGRNA in the last 8760 hours.  Assessment and Plan:  Omental mass For biopsy today Risks and benefits discussed with the patient including, but not limited to bleeding, infection, damage to adjacent structures or low yield requiring additional tests.  All of the patient's questions were answered, patient is agreeable to proceed. Consent signed and in chart.   Thank you for this interesting consult.  I greatly enjoyed meeting DELAYNA SPARLIN and look forward to participating in their care.  A copy of this report was sent to the requesting provider on this date.  Electronically Signed: Lavonia Drafts, PA-C 08/17/2017, 7:16 AM   I spent a total of  30 Minutes   in face to face in clinical consultation, greater than 50% of  which was counseling/coordinating care for omental mass bx

## 2017-08-17 NOTE — Procedures (Signed)
  Procedure: CT core LLQ omental mass 18g x4 EBL:   minimal Complications:  none immediate  See full dictation in [facility].  Dillard Cannon MD Main # [phone] Pager  [phone]

## 2017-08-17 NOTE — Discharge Instructions (Addendum)
Needle Biopsy, Care After °Refer to this sheet in the next few weeks. These instructions provide you with information about caring for yourself after your procedure. Your health care provider may also give you more specific instructions. Your treatment has been planned according to current medical practices, but problems sometimes occur. Call your health care provider if you have any problems or questions after your procedure. °What can I expect after the procedure? °After your procedure, it is common to have soreness, bruising, or mild pain at the biopsy site. This should go away in a few days. °Follow these instructions at home: °· Rest as directed by your health care provider. °· Take medicines only as directed by your health care provider. °· There are many different ways to close and cover the biopsy site, including stitches (sutures), skin glue, and adhesive strips. Follow your health care provider's instructions about: °? Biopsy site care. °? Bandage (dressing) changes and removal. °? Biopsy site closure removal. °· Check your biopsy site every day for signs of infection. Watch for: °? Redness, swelling, or pain. °? Fluid, blood, or pus. °Contact a health care provider if: °· You have a fever. °· You have redness, swelling, or pain at the biopsy site that lasts longer than a few days. °· You have fluid, blood, or pus coming from the biopsy site. °· You feel nauseous. °· You vomit. °Get help right away if: °· You have shortness of breath. °· You have trouble breathing. °· You have chest pain. °· You feel dizzy or you faint. °· You have bleeding that does not stop with pressure or a bandage. °· You cough up blood. °· You have pain in your abdomen. °This information is not intended to replace advice given to you by your health care provider. Make sure you discuss any questions you have with your health care provider. °Document Released: 08/25/2014 Document Revised: 09/16/2015 Document Reviewed:  04/06/2014 °Elsevier Interactive Patient Education © 2018 [employer] °Moderate Conscious Sedation, Adult, Care After °These instructions provide you with information about caring for yourself after your procedure. Your health care provider may also give you more specific instructions. Your treatment has been planned according to current medical practices, but problems sometimes occur. Call your health care provider if you have any problems or questions after your procedure. °What can I expect after the procedure? °After your procedure, it is common: °· To feel sleepy for several hours. °· To feel clumsy and have poor balance for several hours. °· To have poor judgment for several hours. °· To vomit if you eat too soon. ° °Follow these instructions at home: °For at least 24 hours after the procedure: ° °· Do not: °? Participate in activities where you could fall or become injured. °? Drive. °? Use heavy machinery. °? Drink alcohol. °? Take sleeping pills or medicines that cause drowsiness. °? Make important decisions or sign legal documents. °? Take care of children on your own. °· Rest. °Eating and drinking °· Follow the diet recommended by your health care provider. °· If you vomit: °? Drink water, juice, or soup when you can drink without vomiting. °? Make sure you have little or no nausea before eating solid foods. °General instructions °· Have a responsible adult stay with you until you are awake and alert. °· Take over-the-counter and prescription medicines only as told by your health care provider. °· If you smoke, do not smoke without supervision. °· Keep all follow-up visits as told by your health care   provider. This is important. °Contact a health care provider if: °· You keep feeling nauseous or you keep vomiting. °· You feel light-headed. °· You develop a rash. °· You have a fever. °Get help right away if: °· You have trouble breathing. °This information is not intended to replace advice given to you  by your health care provider. Make sure you discuss any questions you have with your health care provider. °Document Released: 01/29/2013 Document Revised: 09/13/2015 Document Reviewed: 07/31/2015 °Elsevier Interactive Patient Education © 2018 [employer] ° °

## 2017-08-17 NOTE — Telephone Encounter (Signed)
Requested a stat read on pathology per Joylene John, NP.  Monday am is fine. Maudie Mercury will call over to [facility] pathology with order.

## 2017-08-20 ENCOUNTER — Ambulatory Visit (HOSPITAL_COMMUNITY)
Admission: RE | Admit: 2017-08-20 | Discharge: 2017-08-20 | Disposition: A | Discharge status: Home / Self Care | Payer: Medicare HMO | Source: Ambulatory Visit | Attending: Gynecologic Oncology | Admitting: Gynecologic Oncology

## 2017-08-20 ENCOUNTER — Telehealth: Payer: Self-pay | Admitting: *Deleted

## 2017-08-20 DIAGNOSIS — C786 Secondary malignant neoplasm of retroperitoneum and peritoneum: Secondary | ICD-10-CM | POA: Diagnosis not present

## 2017-08-20 DIAGNOSIS — K760 Fatty (change of) liver, not elsewhere classified: Secondary | ICD-10-CM | POA: Insufficient documentation

## 2017-08-20 DIAGNOSIS — C772 Secondary and unspecified malignant neoplasm of intra-abdominal lymph nodes: Secondary | ICD-10-CM | POA: Diagnosis not present

## 2017-08-20 DIAGNOSIS — C649 Malignant neoplasm of unspecified kidney, except renal pelvis: Secondary | ICD-10-CM | POA: Diagnosis not present

## 2017-08-20 DIAGNOSIS — R1907 Generalized intra-abdominal and pelvic swelling, mass and lump: Secondary | ICD-10-CM | POA: Diagnosis present

## 2017-08-20 DIAGNOSIS — J90 Pleural effusion, not elsewhere classified: Secondary | ICD-10-CM | POA: Diagnosis not present

## 2017-08-20 DIAGNOSIS — N9489 Other specified conditions associated with female genital organs and menstrual cycle: Secondary | ICD-10-CM | POA: Insufficient documentation

## 2017-08-20 DIAGNOSIS — K229 Disease of esophagus, unspecified: Secondary | ICD-10-CM | POA: Insufficient documentation

## 2017-08-20 LAB — GLUCOSE, CAPILLARY: Glucose-Capillary: 94 mg/dL (ref 65–99)

## 2017-08-20 MED ORDER — IOHEXOL 300 MG/ML  SOLN
75.0000 mL | Freq: Once | INTRAMUSCULAR | Status: AC | PRN
  Administered 2017-08-20: 75 mL via INTRAVENOUS

## 2017-08-20 MED ORDER — FLUDEOXYGLUCOSE F - 18 (FDG) INJECTION
7.9400 | Freq: Once | INTRAVENOUS | Status: AC | PRN
  Administered 2017-08-20: 7.94 via INTRAVENOUS

## 2017-08-20 NOTE — Telephone Encounter (Signed)
Called and spoke with the patient, gave appt date/time for Dr. Alvy Bimler on May 3rd at 3pm

## 2017-08-22 ENCOUNTER — Encounter: Payer: Self-pay | Admitting: Obstetrics

## 2017-08-22 ENCOUNTER — Inpatient Hospital Stay: Payer: Medicare HMO

## 2017-08-22 ENCOUNTER — Encounter: Payer: Self-pay | Admitting: Hematology and Oncology

## 2017-08-22 ENCOUNTER — Inpatient Hospital Stay: Payer: Medicare HMO | Attending: Obstetrics | Admitting: Obstetrics

## 2017-08-22 VITALS — BP 128/80 | HR 75 | Temp 98.5°F | Resp 20 | Ht 65.0 in | Wt 164.5 lb

## 2017-08-22 DIAGNOSIS — M7989 Other specified soft tissue disorders: Secondary | ICD-10-CM | POA: Diagnosis not present

## 2017-08-22 DIAGNOSIS — Z5189 Encounter for other specified aftercare: Secondary | ICD-10-CM | POA: Insufficient documentation

## 2017-08-22 DIAGNOSIS — M8580 Other specified disorders of bone density and structure, unspecified site: Secondary | ICD-10-CM | POA: Insufficient documentation

## 2017-08-22 DIAGNOSIS — E119 Type 2 diabetes mellitus without complications: Secondary | ICD-10-CM | POA: Diagnosis not present

## 2017-08-22 DIAGNOSIS — Z90710 Acquired absence of both cervix and uterus: Secondary | ICD-10-CM

## 2017-08-22 DIAGNOSIS — G893 Neoplasm related pain (acute) (chronic): Secondary | ICD-10-CM | POA: Diagnosis not present

## 2017-08-22 DIAGNOSIS — Z5111 Encounter for antineoplastic chemotherapy: Secondary | ICD-10-CM | POA: Insufficient documentation

## 2017-08-22 DIAGNOSIS — R110 Nausea: Secondary | ICD-10-CM | POA: Diagnosis not present

## 2017-08-22 DIAGNOSIS — C561 Malignant neoplasm of right ovary: Secondary | ICD-10-CM | POA: Insufficient documentation

## 2017-08-22 DIAGNOSIS — C786 Secondary malignant neoplasm of retroperitoneum and peritoneum: Secondary | ICD-10-CM

## 2017-08-22 LAB — CEA (IN HOUSE-CHCC): CEA (CHCC-In House): 3.34 ng/mL (ref 0.00–5.00)

## 2017-08-22 NOTE — Patient Instructions (Signed)
1. Labs today will be done. 2. Followup with Dr. Alvy Bimler this Friday 3. Return to see me in one month to review progress

## 2017-08-22 NOTE — Progress Notes (Addendum)
Consult Note: Gyn-Onc  Originally consult was requested by Dr. Evie Lacks   CC:  Chief Complaint  Patient presents with  . 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  is a very nice 67 y.o.  P2   Interval History: Since her last visit I ordered and we received histology via IR directed omental biopsy consistent with adenocarcinoma of Gyn origin. There is mucinous differentiation. In addition a PET was performed to look at the mediastinal/cardiophrenic lymph node.  Unfortunately that did show FDG uptake and appears to be consistent with metastatic disease which would make her unresectable in the upfront setting.  She returns today to discuss these results and further follow-up on management recommendations.  She has an appointment set up with medical oncology on Friday, Aug 24, 2017.  She is feeling bloated and having early satiety. She has altered her eating regimen to avoid feeling badly. She denies current nausea/emesis.   Presenting History:  She presented to her gynecologist for her annual exam and noted symptoms of bloating along with decreased appetite however increased weight.  On physical exam her gynecologist noted a palpable mass and imaging was ordered including ultrasound.  Ultrasound revealed a complex cystic mass in the right adnexa measuring 20 x 11 x 12 cm with multiple internal septations some of which are thick.  The left adnexa measured 12.7 x 11.6 x 8.1 with low level echoes and soft tissue nodules.  This was 07/30/2017.  The same day she had a Ca125 equal to 521.3.  At some point a provider ordered an MRI of the abdomen and pelvis with and without contrast the lower chest revealed several prominent lower anterior mediastinal lymph nodes including a pericardial measuring 12 mm in short axis.  Hepatic steatosis.  Surface of the liver is noted to have several soft tissue areas of thickening and enhancement suspicious for serosal implants.  Peritoneal disease is  expected in the upper abdomen adjacent to the liver and transverse colon.  Extensive omental and peritoneal implants are appreciated in the left lower quadrant.  She does have an abdominal MRI in 2018 for pancreatic cyst no abdominal pelvic mass or peritoneal disease was noted at that time.  Looking back she thinks that over the 6 months prior to presentation she noted she had just not been feeling her usual self with decreased energy and occasional discomfort in the abdomen.  Again she notes a weight gain despite a decreased appetite.  She describes her pain as being vaginal and abdominal 5 out of 10 sharp to dull it is brief but aggravated by sitting she alleviates it with anti-inflammatories.  She does also note bloating increased symptoms of reflux and urinary urgency.  Her bowel movements do not seem to be affected she is having daily bowel movements and denies persistent nausea and vomiting.  She has had some nausea which occurs about twice per week.  On a side note she was a widow and recently married a new gentleman who was unable to accompany her today due to illness, however her sister-in-law (married to her brother) is present during today's visit.  Measurement of disease:  No results for input(s): CA125, CAN125, CEA, CA199, ESTRADIOL, INHBB in the last 8760 hours.  Invalid input(s): INHIBINA  . 07/30/2017-Ca125-  521.3 performed at [facility]   CEA and CA19-9 ordered 08/22/17 given the "mucinous" differentiation in the biopsy report   Radiology:   08/02/2017-MRI of the abdomen and pelvis with and  without contrast -performed in [place], [place] -  the lower chest revealed several prominent lower anterior mediastinal lymph nodes including a pericardial measuring 12 mm in short axis.  Hepatic steatosis.  Surface of the liver is noted to have several soft tissue areas of thickening and enhancement suspicious for serosal implants.  Peritoneal disease is expected in the upper abdomen  adjacent to the liver and transverse colon.  Extensive omental and peritoneal implants are appreciated in the left lower quadrant.  08/20/2017 chest CT-[place]-as noted in the oncologic history  08/20/2017-PET scan-[place]-as noted in the oncologic history     Oncologic History: Pending further workup     Right ovarian epithelial cancer ([place])   07/30/2017 Imaging    US pelvis Ultrasound revealed a complex cystic mass in the right adnexa measuring 20 x 11 x 12 cm with multiple internal septations some of which are thick. The left adnexa measured 12.7 x 11.6 x 8.1 with low level echoes and soft tissue nodules       07/30/2017 Tumor Marker    Patient's tumor was tested for the following markers: CA-125 Results of the tumor marker test revealed 521.3      08/17/2017 Pathology Results    The malignant cells are positive for PAX-8, cytokeratin 7, estrogen receptor, and faintly positive for GATA-3. They are negative for p53, GCDFP, and cytokeratin 20. The finding are consistent with a gynecologic primary carcinoma. Additional studies can be performed upon clinician request.      08/17/2017 Procedure    Technically successful CT-guided left lower quadrant omental mass core biopsy.      08/20/2017 PET scan    1. Cystic masses arising from the pelvis. The nodular component of the RIGHT cystic mass is intensely hypermetabolic consistent with malignant ovarian neoplasm. 2. Extensive hypermetabolic peritoneal thickening in the lower abdomen and upper pelvis, upper abdomen, and upper abdominal precordial fat and paradiaphragmatic fat. 3. Retroperitoneal nodal metastasis adjacent to the IVC at the level the kidneys. 4. No evidence of metastatic disease in the thorax other small effusion on the LEFT and nodal metastasis in the fat superior to the diaphragm. 5. Mild metabolic activity associated the distal esophagus is favored benign esophagitis.      08/20/2017 Imaging    CT chest 1.  Bilateral cardiophrenic angle nodal metastasis. No additional findings to suggest metastatic disease to the chest. 2. Small left pleural effusion. 3. Peritoneal carcinomatosis noted within the abdomen. 4. Hepatic steatosis.        Current Meds:  Outpatient Encounter Medications as of 08/22/2017  Medication Sig  . acetaminophen (TYLENOL) 500 MG tablet Take 500 mg by mouth every 8 (eight) hours as needed for mild pain or moderate pain.  Marland Kitchen atenolol (TENORMIN) 25 MG tablet Take 25 mg by mouth at bedtime.   Marland Kitchen CALCIUM-MAGNESIUM-VITAMIN D PO Take 1 tablet by mouth daily.  Marland Kitchen loratadine (CLARITIN) 10 MG tablet Take 10 mg by mouth daily as needed for allergies.  . metFORMIN (GLUCOPHAGE) 1000 MG tablet Take 500 mg by mouth 2 (two) times daily.  . Multiple Vitamin (MULTIVITAMIN WITH MINERALS) TABS tablet Take 1 tablet by mouth daily.  . [DISCONTINUED] aspirin EC 81 MG tablet Take 81 mg by mouth daily.   No facility-administered encounter medications on file as of 08/22/2017.     Allergy: No Known Allergies  Social Hx:   Social History   Socioeconomic History  . Marital status: Married    Spouse name: Not on file  . Number  of children: Not on file  . Years of education: Not on file  . Highest education level: Not on file  Occupational History  . Not on file  Social Needs  . Financial resource strain: Not on file  . Food insecurity:    Worry: Not on file    Inability: Not on file  . Transportation needs:    Medical: Not on file    Non-medical: Not on file  Tobacco Use  . Smoking status: Never Smoker  . Smokeless tobacco: Never Used  Substance and Sexual Activity  . Alcohol use: Yes    Alcohol/week: 2.5 oz    Types: 5 Standard drinks or equivalent per week    Comment: occ  . Drug use: No  . Sexual activity: Yes    Birth control/protection: None  Lifestyle  . Physical activity:    Days per week: Not on file    Minutes per session: Not on file  . Stress: Not on file   Relationships  . Social connections:    Talks on phone: Not on file    Gets together: Not on file    Attends religious service: Not on file    Active member of club or organization: Not on file    Attends meetings of clubs or organizations: Not on file    Relationship status: Not on file  . Intimate partner violence:    Fear of current or ex partner: Not on file    Emotionally abused: Not on file    Physically abused: Not on file    Forced sexual activity: Not on file  Other Topics Concern  . Not on file  Social History Narrative  . Not on file    Past Surgical Hx:  Past Surgical History:  Procedure Laterality Date  . CHOLECYSTECTOMY N/A 07/06/2014   Procedure: LAPAROSCOPIC CHOLECYSTECTOMY;  Surgeon: Aviva Signs Md, MD;  Location: [facility] ORS;  Service: General;  Laterality: N/A;  . COLONOSCOPY N/A 06/26/2013   Procedure: COLONOSCOPY;  Surgeon: Rogene Houston, MD;  Location: [facility] ENDO SUITE;  Service: Endoscopy;  Laterality: N/A;  1030  . VAGINAL HYSTERECTOMY  2011    Past Medical Hx:  Past Medical History:  Diagnosis Date  . Diabetes mellitus without complication ([place])   . Diverticulosis   . Hypertension   . Osteopenia   . Ovarian cancer ([place])   . Pancreatic cyst     Past Gynecological History:   GYNECOLOGIC HISTORY:  No LMP recorded. Patient has had a hysterectomy. Menarche: 67 years old P 39 LMP 67 years old Contraceptive; yes, <10 years HRT <1 year  Last Pap s/p vaginal hysterectomy 2011 for "prolapse"  Family Hx:  Family History  Problem Relation Age of Onset  . Colon cancer Mother   . Skin cancer Father 47       unsure type  . Cancer Maternal Grandmother 101       unknown cancer  . Brain cancer Maternal Grandfather 70    Review of Systems:  Review of Systems  Constitutional: Negative.   HENT: Negative.   Eyes: Negative.   Respiratory: Positive for shortness of breath.   Cardiovascular: Negative.   Gastrointestinal: Positive for abdominal pain.   Genitourinary: Negative.   Musculoskeletal: Negative.   Skin: Negative.   Neurological: Negative.   Endo/Heme/Allergies: Negative.   Psychiatric/Behavioral: The patient is nervous/anxious.        Vitals:  Blood pressure 128/80, pulse 75, temperature 98.5 F (36.9 C), temperature source Oral, resp.  rate 20, height _0  (1.651 m), weight 164 lb 8 oz (74.6 kg), SpO2 97 %. Body mass index is 27.37 kg/m.   Physical Exam: ECOG PERFORMANCE STATUS: 1 - Symptomatic but completely ambulatory  General :  Well developed, 67 y.o., female in no apparent distress HEENT:  Normocephalic/atraumatic, symmetric, EOMI, eyelids normal Neck:   No visible masses.  Respiratory:  Respirations unlabored, no use of accessory muscles CV:   Deferred Breast:  Deferred Musculoskeletal: Normal muscle strength. Abdomen:  Visible fullness and distention due to underlying ovarian masses Extremities:  No visible edema or deformities Skin:   Normal inspection Neuro/Psych:  No focal motor deficit, no abnormal mental status. Normal gait. Normal affect. Alert and oriented to person, place, and time  Genito Urinary from visit 08/03/17: Vulva: Normal external female genitalia.  Bladder/urethra: Urethral meatus normal in size and location. No lesions or masses, well supported bladder Speculum exam: Vagina: No lesion, no discharge, no bleeding. Cervix: Surgically absent Bimanual exam: There is a surgically absent there is palpable fullness but no specific nodularity Uterus: Absent Rectovaginal:  Good tone, fullness in the cul-de-sac but no cul de sac nodularity, no parametrial involvement or nodularity.  The rectal wall feels smooth but there is some mild compression anteriorly from the mass in the pelvis which is mildly mobile.  This compression starts at approximately 7-8 cm in from the anal verge.  Oncologic Summary: 1. Ovarian adenocarcinoma  2. Cardiophrenic lymphadenopathy    Assessment/Plan: 1. Today we  discussed the concern for malignancy including the abnormal imaging and elevated Ca1 25 2. The PET scan and chest CT was reviewed by me personally and I agree with the findings.  3. Unfortunately this makes the prospect of optimal cytoreduction not possible in the upfront setting and I therefore recommend neoadjuvant chemotherapy. 4. An appointment has been arranged with Dr. Alvy Bimler Friday Aug 24, 2017. o She did ask me last visit about undergoing treatment closer to home however, given we may be taking a neoadjuvant approach with intervening surgery, timing and continuity of care with this office would be the most ideal scenario.   5. We did briefly touch on prognosis and typical treatment planning and expectations with chemotherapy.  She will likely need to re-review these with each visit during these initial planning stages. 6. Return to see me in one month to followup on progress of therapy. 7. We discussed plans for interval debulking assessments including CTScan after 3-4 cycles.    Isabel Caprice, MD  08/22/2017, 10:44 AM  Cc: Sinda Du, Linna Hoff, [place] (PCP) Gari Crown, MD (Referring OB/Gyn)

## 2017-08-22 NOTE — Addendum Note (Signed)
Addended by: Precious Haws B on: 08/22/2017 10:29 AM   Modules accepted: Orders

## 2017-08-23 ENCOUNTER — Telehealth: Payer: Self-pay

## 2017-08-23 LAB — CANCER ANTIGEN 19-9: CAN 19-9: 31 U/mL (ref 0–35)

## 2017-08-23 NOTE — Telephone Encounter (Signed)
Isabel Caprice, MD  Joylene John D, NP        Let her know those extra labs were normal. So we won't follow those going forward.   Previous Messages

## 2017-08-23 NOTE — Telephone Encounter (Signed)
Told Ms Speth the results of the CEA and CA-19.9 as noted below by Dr. Gerarda Fraction. Pt verbalized understanding.

## 2017-08-24 ENCOUNTER — Encounter: Payer: Self-pay | Admitting: Oncology

## 2017-08-24 ENCOUNTER — Telehealth: Payer: Self-pay | Admitting: Hematology and Oncology

## 2017-08-24 ENCOUNTER — Other Ambulatory Visit: Payer: Self-pay | Admitting: Hematology and Oncology

## 2017-08-24 ENCOUNTER — Encounter: Payer: Self-pay | Admitting: Hematology and Oncology

## 2017-08-24 ENCOUNTER — Inpatient Hospital Stay (HOSPITAL_BASED_OUTPATIENT_CLINIC_OR_DEPARTMENT_OTHER): Payer: Medicare HMO | Admitting: Hematology and Oncology

## 2017-08-24 VITALS — BP 136/80 | HR 89 | Temp 97.8°F | Resp 18 | Ht 65.0 in | Wt 165.6 lb

## 2017-08-24 DIAGNOSIS — R110 Nausea: Secondary | ICD-10-CM

## 2017-08-24 DIAGNOSIS — C561 Malignant neoplasm of right ovary: Secondary | ICD-10-CM

## 2017-08-24 DIAGNOSIS — M8580 Other specified disorders of bone density and structure, unspecified site: Secondary | ICD-10-CM

## 2017-08-24 DIAGNOSIS — G893 Neoplasm related pain (acute) (chronic): Secondary | ICD-10-CM

## 2017-08-24 DIAGNOSIS — E119 Type 2 diabetes mellitus without complications: Secondary | ICD-10-CM | POA: Diagnosis not present

## 2017-08-24 MED ORDER — ONDANSETRON HCL 8 MG PO TABS: 8.0000 mg | ORAL_TABLET | Freq: Three times a day (TID) | ORAL | 3 refills | Status: DC | PRN

## 2017-08-24 MED ORDER — PROCHLORPERAZINE MALEATE 10 MG PO TABS: 10.0000 mg | ORAL_TABLET | Freq: Four times a day (QID) | ORAL | 0 refills | Status: DC | PRN

## 2017-08-24 NOTE — Progress Notes (Signed)
  Oncology Nurse Navigator Documentation  Navigator Location: [facility] (08/24/17 1601)   )Navigator Encounter Type: Initial MedOnc (08/24/17 1601)     Confirmed Diagnosis Date: 08/17/17 (08/24/17 1601)             Treatment Initiated Date: 09/05/17 (08/24/17 1601) Patient Visit Type: MedOnc (08/24/17 1601) Treatment Phase: Pre-Tx/Tx Discussion (08/24/17 1601) Barriers/Navigation Needs: Education (08/24/17 1601) Education: Preparing for Upcoming Surgery/ Treatment (08/24/17 1601) Interventions: Education (08/24/17 1601)     Education Method: Verbal;Written (08/24/17 1601)      Acuity: Level 2 (08/24/17 1601)   Acuity Level 2: Initial guidance, education and coordination as needed (08/24/17 1601)         Met with patient during Dr. Calton Dach appointment.  Patient was given handouts for support group, patient navigation and map of the cancer center.  She was encouraged to call with any questions.

## 2017-08-24 NOTE — Assessment & Plan Note (Signed)
I recommend trial of antiemetics

## 2017-08-24 NOTE — Progress Notes (Signed)
START ON PATHWAY REGIMEN - Ovarian     A cycle is every 21 days:     Paclitaxel      Carboplatin   **Always confirm dose/schedule in your pharmacy ordering system**    Patient Characteristics: Newly Diagnosed, Neoadjuvant Therapy Therapeutic Status: Newly Diagnosed AJCC T Category: T3 AJCC N Category: N1 AJCC M Category: M1 AJCC 8 Stage Grouping: IV BRCA Mutation Status: Did Not Order Test Intent of Therapy: Curative Intent, Discussed with Patient 

## 2017-08-24 NOTE — Telephone Encounter (Signed)
Gave patient AVS and calendar of upcoming May and June appointments

## 2017-08-24 NOTE — Assessment & Plan Note (Signed)
She has minimum cancer pain Tylenol seems to be helpful I recommend continue over-the-counter analgesics

## 2017-08-24 NOTE — Progress Notes (Signed)
[place] NOTE  Patient Care Team: Sinda Du, MD as PCP - General (Pulmonary Disease)  ASSESSMENT & PLAN:  Right ovarian epithelial cancer [facility]) We discussed neoadjuvant approach to locally advanced ovarian cancer. We reviewed the NCCN guidelines We discussed the role of chemotherapy. The intent is of curative intent with combination carboplatin and Taxol. I will schedule chemo education class next week.  She has port appointment scheduled on Sep 03, 2017 Tentatively, her first dose of chemotherapy will be on Sep 05, 2017 With her age, I recommend G-CSF support I will see her back next week to discuss side effects of treatment and further supportive care  Chronic nausea I recommend trial of antiemetics  Cancer associated pain She has minimum cancer pain Tylenol seems to be helpful I recommend continue over-the-counter analgesics  Type 2 diabetes mellitus treated without insulin ([place]) She has stable diabetes on medication It is likely her blood sugar will be high We will discuss frequent blood sugar monitoring and reduced dose premedication dexamethasone   No orders of the defined types were placed in this encounter.    CHIEF COMPLAINTS/PURPOSE OF CONSULTATION:  Right ovarian cancer, for neoadjuvant chemotherapy  HISTORY OF PRESENTING ILLNESS:  Sabrina Vang 67 y.o. female is here because of recent diagnosis of ovarian cancer. Her husband, Rush Landmark is present.  They will be celebrating one year anniversary on Sep 11, 2017 The patient has 2 adult sons The patient started to have abdominal bloating, indigestion and pressure sensation in the upper portion of her abdomen for about 3 months She denies changes in bowel habits She has unintentional weight gain She complained of abdominal bloating She has deep intermittent pelvic pain but it is not severe enough to warrant frequent use of analgesics She has early satiety She has sensation of chronic  nausea She denies abnormal vaginal bleeding  I have reviewed her chart and materials related to her cancer extensively and collaborated history with the patient. Summary of oncologic history is as follows:   Right ovarian epithelial cancer ([place])   07/30/2017 Imaging    US pelvis Ultrasound revealed a complex cystic mass in the right adnexa measuring 20 x 11 x 12 cm with multiple internal septations some of which are thick. The left adnexa measured 12.7 x 11.6 x 8.1 with low level echoes and soft tissue nodules       07/30/2017 Tumor Marker    Patient's tumor was tested for the following markers: CA-125 Results of the tumor marker test revealed 521.3      08/17/2017 Pathology Results    The malignant cells are positive for PAX-8, cytokeratin 7, estrogen receptor, and faintly positive for GATA-3. They are negative for p53, GCDFP, and cytokeratin 20. The finding are consistent with a gynecologic primary carcinoma. Additional studies can be performed upon clinician request.      08/17/2017 Procedure    Technically successful CT-guided left lower quadrant omental mass core biopsy.      08/20/2017 PET scan    1. Cystic masses arising from the pelvis. The nodular component of the RIGHT cystic mass is intensely hypermetabolic consistent with malignant ovarian neoplasm. 2. Extensive hypermetabolic peritoneal thickening in the lower abdomen and upper pelvis, upper abdomen, and upper abdominal precordial fat and paradiaphragmatic fat. 3. Retroperitoneal nodal metastasis adjacent to the IVC at the level the kidneys. 4. No evidence of metastatic disease in the thorax other small effusion on the LEFT and nodal metastasis in the fat superior to the  diaphragm. 5. Mild metabolic activity associated the distal esophagus is favored benign esophagitis.      08/20/2017 Imaging    CT chest 1. Bilateral cardiophrenic angle nodal metastasis. No additional findings to suggest metastatic disease to the chest. 2.  Small left pleural effusion. 3. Peritoneal carcinomatosis noted within the abdomen. 4. Hepatic steatosis.       08/24/2017 Cancer Staging    Staging form: Ovary, Fallopian Tube, and Primary Peritoneal Carcinoma, AJCC 8th Edition - Clinical: Stage IV (cT3, cN1, cM1) - Signed by [name], MD on 08/24/2017      At present time, she denies significant pain. She has some sensation of nausea  MEDICAL HISTORY:  Past Medical History:  Diagnosis Date  . Diabetes mellitus without complication ([place])   . Diverticulosis   . Hypertension   . Osteopenia   . Ovarian cancer ([place])   . Pancreatic cyst     SURGICAL HISTORY: Past Surgical History:  Procedure Laterality Date  . ABDOMINAL HYSTERECTOMY    . CHOLECYSTECTOMY N/A 07/06/2014   Procedure: LAPAROSCOPIC CHOLECYSTECTOMY;  Surgeon: Aviva Signs Md, MD;  Location: [facility] ORS;  Service: General;  Laterality: N/A;  . COLONOSCOPY N/A 06/26/2013   Procedure: COLONOSCOPY;  Surgeon: Rogene Houston, MD;  Location: [facility] ENDO SUITE;  Service: Endoscopy;  Laterality: N/A;  1030  . VAGINAL HYSTERECTOMY  2011    SOCIAL HISTORY: Social History   Socioeconomic History  . Marital status: Married    Spouse name: Rush Landmark  . Number of children: 2  . Years of education: Not on file  . Highest education level: Not on file  Occupational History  . Occupation: retired Microbiologist  . Financial resource strain: Not on file  . Food insecurity:    Worry: Not on file    Inability: Not on file  . Transportation needs:    Medical: Not on file    Non-medical: Not on file  Tobacco Use  . Smoking status: Never Smoker  . Smokeless tobacco: Never Used  Substance and Sexual Activity  . Alcohol use: Yes    Alcohol/week: 2.5 oz    Types: 5 Standard drinks or equivalent per week    Comment: occ  . Drug use: No  . Sexual activity: Yes    Birth control/protection: None  Lifestyle  . Physical activity:    Days per week: Not on file    Minutes  per session: Not on file  . Stress: Not on file  Relationships  . Social connections:    Talks on phone: Not on file    Gets together: Not on file    Attends religious service: Not on file    Active member of club or organization: Not on file    Attends meetings of clubs or organizations: Not on file    Relationship status: Not on file  . Intimate partner violence:    Fear of current or ex partner: Not on file    Emotionally abused: Not on file    Physically abused: Not on file    Forced sexual activity: Not on file  Other Topics Concern  . Not on file  Social History Narrative  . Not on file    FAMILY HISTORY: Family History  Problem Relation Age of Onset  . Colon cancer Mother 18       colon ca  . Skin cancer Father 74       unsure type  . Cancer Maternal Grandmother 101  unknown cancer  . Brain cancer Maternal Grandfather 77    ALLERGIES:  has No Known Allergies.  MEDICATIONS:  Current Outpatient Medications  Medication Sig Dispense Refill  . acetaminophen (TYLENOL) 500 MG tablet Take 500 mg by mouth every 8 (eight) hours as needed for mild pain or moderate pain.    Marland Kitchen atenolol (TENORMIN) 25 MG tablet Take 25 mg by mouth at bedtime.     Marland Kitchen CALCIUM-MAGNESIUM-VITAMIN D PO Take 1 tablet by mouth daily.    Marland Kitchen loratadine (CLARITIN) 10 MG tablet Take 10 mg by mouth daily as needed for allergies.    . metFORMIN (GLUCOPHAGE) 1000 MG tablet Take 500 mg by mouth 2 (two) times daily.  0  . Multiple Vitamin (MULTIVITAMIN WITH MINERALS) TABS tablet Take 1 tablet by mouth daily.    . ondansetron (ZOFRAN) 8 MG tablet Take 1 tablet (8 mg total) by mouth every 8 (eight) hours as needed for nausea. 30 tablet 3  . prochlorperazine (COMPAZINE) 10 MG tablet Take 1 tablet (10 mg total) by mouth every 6 (six) hours as needed for nausea or vomiting. 30 tablet 0   No current facility-administered medications for this visit.     REVIEW OF SYSTEMS:   Constitutional: Denies fevers, chills  or abnormal night sweats Eyes: Denies blurriness of vision, double vision or watery eyes Ears, nose, mouth, throat, and face: Denies mucositis or sore throat Respiratory: Denies cough, dyspnea or wheezes Cardiovascular: Denies palpitation, chest discomfort or lower extremity swelling Skin: Denies abnormal skin rashes Lymphatics: Denies new lymphadenopathy or easy bruising Neurological:Denies numbness, tingling or new weaknesses Behavioral/Psych: Mood is stable, no new changes  All other systems were reviewed with the patient and are negative.  PHYSICAL EXAMINATION: ECOG PERFORMANCE STATUS: 1 - Symptomatic but completely ambulatory  Vitals:   08/24/17 1522  BP: 136/80  Pulse: 89  Resp: 18  Temp: 97.8 F (36.6 C)  SpO2: 99%   Filed Weights   08/24/17 1522  Weight: 165 lb 9.6 oz (75.1 kg)    GENERAL:alert, no distress and comfortable SKIN: skin color, texture, turgor are normal, no rashes or significant lesions EYES: normal, conjunctiva are pink and non-injected, sclera clear OROPHARYNX:no exudate, no erythema and lips, buccal mucosa, and tongue normal  NECK: supple, thyroid normal size, non-tender, without nodularity LYMPH:  no palpable lymphadenopathy in the cervical, axillary or inguinal LUNGS: clear to auscultation and percussion with normal breathing effort HEART: regular rate & rhythm and no murmurs and no lower extremity edema ABDOMEN:abdomen soft, appears distended, palpable abdominal mass mildly tender without rebound or guarding Musculoskeletal:no cyanosis of digits and no clubbing  PSYCH: alert & oriented x 3 with fluent speech NEURO: no focal motor/sensory deficits  LABORATORY DATA:  I have reviewed the data as listed Lab Results  Component Value Date   WBC 8.6 08/17/2017   HGB 13.3 08/17/2017   HCT 41.2 08/17/2017   MCV 90.7 08/17/2017   PLT 256 08/17/2017   No results for input(s): NA, K, CL, CO2, GLUCOSE, BUN, CREATININE, CALCIUM, GFRNONAA, GFRAA, PROT,  ALBUMIN, AST, ALT, ALKPHOS, BILITOT, BILIDIR, IBILI in the last 8760 hours.  RADIOGRAPHIC STUDIES: I have personally reviewed the radiological images as listed and agreed with the findings in the report. Ct Chest W Contrast  Result Date: 08/21/2017 CLINICAL DATA:  Abdominal pain and bloating. Probable ovarian cancer. EXAM: CT CHEST WITH CONTRAST TECHNIQUE: Multidetector CT imaging of the chest was performed during intravenous contrast administration. CONTRAST:  60m OMNIPAQUE IOHEXOL 300 MG/ML  SOLN  COMPARISON:  08/20/2017 FINDINGS: Cardiovascular: Normal heart size.  No pericardial effusion. Mediastinum/Nodes: Normal appearance of the thyroid gland. The trachea appears patent and is midline. Normal appearance of the esophagus. No mediastinal or hilar adenopathy. Left cardiophrenic angle node measures 1.3 cm, image 110/2. Large right CP angle node measures 1.9 cm, image 104/2. Lungs/Pleura: Small left pleural effusion is identified. Subpleural atelectasis in the left base noted. Subsegmental atelectasis within the anterior right base noted. No suspicious pulmonary nodules. Upper Abdomen: Extensive peritoneal carcinomatosis identified within the visualized portions of the upper abdomen. Diffuse hepatic steatosis noted. Musculoskeletal: Spondylosis within the thoracic spine. IMPRESSION: 1. Bilateral cardiophrenic angle nodal metastasis. No additional findings to suggest metastatic disease to the chest. 2. Small left pleural effusion. 3. Peritoneal carcinomatosis noted within the abdomen. 4. Hepatic steatosis. Electronically Signed   By: Kerby Moors M.D.   On: 08/21/2017 09:09   Nm Pet Image Initial (pi) Skull Base To Thigh  Result Date: 08/20/2017 CLINICAL DATA:  Initial treatment strategy for ovarian masses with peritoneal metastasis. EXAM: NUCLEAR MEDICINE PET SKULL BASE TO THIGH TECHNIQUE: 7.9 mCi F-18 FDG was injected intravenously. Full-ring PET imaging was performed from the skull base to thigh  after the radiotracer. CT data was obtained and used for attenuation correction and anatomic localization. Fasting blood glucose: 94 mg/dl COMPARISON:  MRI 08/02/2017 FINDINGS: Mediastinal blood pool activity: SUV max 2.1 NECK: No hypermetabolic lymph nodes in the neck. Incidental CT findings: none CHEST: Mild metabolic activity diffusely in the distal esophagus without focal lesion. No suspicious pulmonary nodules. No hypermetabolic mediastinal lymph nodes. Incidental CT findings: Small LEFT effusion ABDOMEN/PELVIS: Large cystic lesion arising from the LEFT and RIGHT ovaries. Along the inferior margin of the larger RIGHT cystic lesion there is hypermetabolic mural nodularity with SUV max equal 15.1. There is hypermetabolic peritoneal thickening in the ventral peritoneal surface of the upper pelvis with SUV max equal 10.3. Hypermetabolic nodule implants scattered within the peritoneal space. Example nodule along the inferior margin of the medial RIGHT hepatic lobe with SUV max equal 12.3 and measuring 22 mm on image 104/4. Hypermetabolic metastasis extend into precordial fat and fat superior to the diaphragm. The most superior nodule is in the precordial fat adjacent to the RIGHT atrium measuring 2.1 cm with SUV max equal 11.3 (image 73/4). Nodule in the anterior fat If the patient is low risk for carcinoma, recommend follow-up noncontrast CT in 12 months. If high risk, recommend follow-up in 6 to 12 months per Fleischner criteria. In the LEFT upper quadrant in the fat anterior to the diaphragm rounded lesion with SUV max equal 13.1 measures 13 mm on image 84/4. No focal lesion within the parenchyma of the liver. Hypermetabolic retroperitoneal lymph node posterior to the IVC at the level the kidneys with SUV max equal 13.0 (12 mm image 96/4). Post hysterectomy anatomy Incidental CT findings: No bowel obstruction. Post hysterectomy. No ureteral obstruction. SKELETON: No focal hypermetabolic activity to suggest  skeletal metastasis. Incidental CT findings: none IMPRESSION: 1. Cystic masses arising from the pelvis. The nodular component of the RIGHT cystic mass is intensely hypermetabolic consistent with malignant ovarian neoplasm. 2. Extensive hypermetabolic peritoneal thickening in the lower abdomen and upper pelvis, upper abdomen, and upper abdominal precordial fat and paradiaphragmatic fat. 3. Retroperitoneal nodal metastasis adjacent to the IVC at the level the kidneys. 4. No evidence of metastatic disease in the thorax other small effusion on the LEFT and nodal metastasis in the fat superior to the diaphragm. 5. Mild metabolic activity  associated the distal esophagus is favored benign esophagitis. Electronically Signed   By: Suzy Bouchard M.D.   On: 08/20/2017 17:22   Ct Biopsy  Result Date: 08/17/2017 CLINICAL DATA:  adnexal lesions (right greater than left), with apparent intraperitoneal spread of disease as evidenced by multiple enhancing peritoneal nodules and masses, as well as probable serosal implants associated with the surface of the liver. Probable lymphadenopathy is also noted in the upper abdomen, as well as in the lower anterior mediastinum EXAM: CT GUIDED CORE BIOPSY OF OMENTAL MASS ANESTHESIA/SEDATION: Intravenous Fentanyl and Versed were administered as conscious sedation during continuous monitoring of the patient's level of consciousness and physiological / cardiorespiratory status by the radiology RN, with a total moderate sedation time of 10 minutes. PROCEDURE: The procedure risks, benefits, and alternatives were explained to the patient. Questions regarding the procedure were encouraged and answered. The patient understands and consents to the procedure. Select axial scans through the lower abdomen were obtained. Anterior left omental mass localized and an appropriate skin entry site identified and marked. The operative field was prepped with chlorhexidinein a sterile fashion, and a sterile  drape was applied covering the operative field. A sterile gown and sterile gloves were used for the procedure. Local anesthesia was provided with 1% Lidocaine. Under CT fluoroscopic guidance, a 17 gauge trocar needle was advanced to the margin of the lesion. Once needle tip position was confirmed, coaxial 18-gauge core biopsy samples were obtained, submitted in formalin to surgical pathology. The guide needle was removed. Postprocedure scans show no hemorrhage or other apparent complication. COMPLICATIONS: None immediate FINDINGS: Bilateral cystic pelvic masses right greater than left and left lower quadrant omental disease was localized. Representative core biopsy samples of the omental disease obtained as above. IMPRESSION: 1. Technically successful CT-guided left lower quadrant omental mass core biopsy. Electronically Signed   By: Lucrezia Europe M.D.   On: 08/17/2017 12:05    I spent 55 minutes counseling the patient face to face. The total time spent in the appointment was 80 minutes and more than 50% was on counseling.  All questions were answered. The patient knows to call the clinic with any problems, questions or concerns.  [name], MD 08/24/2017 3:52 PM

## 2017-08-24 NOTE — Assessment & Plan Note (Addendum)
We discussed neoadjuvant approach to locally advanced ovarian cancer. We reviewed the NCCN guidelines We discussed the role of chemotherapy. The intent is of curative intent with combination carboplatin and Taxol. I will schedule chemo education class next week.  She has port appointment scheduled on Sep 03, 2017 Tentatively, her first dose of chemotherapy will be on Sep 05, 2017 With her age, I recommend G-CSF support I will see her back next week to discuss side effects of treatment and further supportive care

## 2017-08-24 NOTE — Assessment & Plan Note (Signed)
She has stable diabetes on medication It is likely her blood sugar will be high We will discuss frequent blood sugar monitoring and reduced dose premedication dexamethasone

## 2017-08-27 ENCOUNTER — Other Ambulatory Visit: Payer: Self-pay | Admitting: Hematology and Oncology

## 2017-08-27 DIAGNOSIS — C561 Malignant neoplasm of right ovary: Secondary | ICD-10-CM

## 2017-08-27 NOTE — Progress Notes (Signed)
Submitted prior auth for Zofran 8 mg. Status is pending.

## 2017-08-29 ENCOUNTER — Encounter: Payer: Self-pay | Admitting: *Deleted

## 2017-08-29 ENCOUNTER — Telehealth: Payer: Self-pay | Admitting: Hematology and Oncology

## 2017-08-29 DIAGNOSIS — Z006 Encounter for examination for normal comparison and control in clinical research program: Secondary | ICD-10-CM

## 2017-08-29 NOTE — Telephone Encounter (Signed)
I have reviewed her case with the research nurse The patient will qualify for specimen only research trial She appears to be interested I will get the referral sent to the clinical nurse to arrange for participation in clinical trial and blood draw

## 2017-08-30 ENCOUNTER — Telehealth: Payer: Self-pay

## 2017-08-30 ENCOUNTER — Other Ambulatory Visit: Payer: Self-pay | Admitting: Radiology

## 2017-08-30 NOTE — Telephone Encounter (Signed)
Opened in error, no entry.  

## 2017-08-31 ENCOUNTER — Other Ambulatory Visit: Payer: Self-pay | Admitting: Hematology and Oncology

## 2017-08-31 ENCOUNTER — Inpatient Hospital Stay: Payer: Medicare HMO

## 2017-08-31 ENCOUNTER — Encounter: Payer: Self-pay | Admitting: Hematology and Oncology

## 2017-08-31 ENCOUNTER — Telehealth: Payer: Self-pay | Admitting: *Deleted

## 2017-08-31 ENCOUNTER — Inpatient Hospital Stay (HOSPITAL_BASED_OUTPATIENT_CLINIC_OR_DEPARTMENT_OTHER): Payer: Medicare HMO | Admitting: Hematology and Oncology

## 2017-08-31 DIAGNOSIS — G893 Neoplasm related pain (acute) (chronic): Secondary | ICD-10-CM

## 2017-08-31 DIAGNOSIS — M7989 Other specified soft tissue disorders: Secondary | ICD-10-CM | POA: Diagnosis not present

## 2017-08-31 DIAGNOSIS — R110 Nausea: Secondary | ICD-10-CM | POA: Diagnosis not present

## 2017-08-31 DIAGNOSIS — Z90710 Acquired absence of both cervix and uterus: Secondary | ICD-10-CM | POA: Diagnosis not present

## 2017-08-31 DIAGNOSIS — Z5111 Encounter for antineoplastic chemotherapy: Secondary | ICD-10-CM | POA: Diagnosis not present

## 2017-08-31 DIAGNOSIS — M8580 Other specified disorders of bone density and structure, unspecified site: Secondary | ICD-10-CM

## 2017-08-31 DIAGNOSIS — C561 Malignant neoplasm of right ovary: Secondary | ICD-10-CM

## 2017-08-31 DIAGNOSIS — R590 Localized enlarged lymph nodes: Secondary | ICD-10-CM

## 2017-08-31 DIAGNOSIS — E119 Type 2 diabetes mellitus without complications: Secondary | ICD-10-CM | POA: Diagnosis not present

## 2017-08-31 DIAGNOSIS — Z5189 Encounter for other specified aftercare: Secondary | ICD-10-CM | POA: Diagnosis not present

## 2017-08-31 DIAGNOSIS — Z006 Encounter for examination for normal comparison and control in clinical research program: Secondary | ICD-10-CM

## 2017-08-31 DIAGNOSIS — C786 Secondary malignant neoplasm of retroperitoneum and peritoneum: Secondary | ICD-10-CM | POA: Diagnosis not present

## 2017-08-31 LAB — COMPREHENSIVE METABOLIC PANEL
ALT: 12 U/L (ref 0–55)
ANION GAP: 8 (ref 3–11)
AST: 16 U/L (ref 5–34)
Albumin: 3.8 g/dL (ref 3.5–5.0)
Alkaline Phosphatase: 67 U/L (ref 40–150)
BUN: 11 mg/dL (ref 7–26)
CHLORIDE: 106 mmol/L (ref 98–109)
CO2: 25 mmol/L (ref 22–29)
Calcium: 9.4 mg/dL (ref 8.4–10.4)
Creatinine, Ser: 1.07 mg/dL (ref 0.60–1.10)
GFR calc Af Amer: >60 mL/min (ref >60)
GFR, EST NON AFRICAN AMERICAN: 52 mL/min — AB (ref >60)
Glucose, Bld: 140 mg/dL (ref 70–140)
POTASSIUM: 3.9 mmol/L (ref 3.5–5.1)
SODIUM: 139 mmol/L (ref 136–145)
Total Bilirubin: 0.4 mg/dL (ref 0.2–1.2)
Total Protein: 7.7 g/dL (ref 6.4–8.3)

## 2017-08-31 LAB — RESEARCH LABS

## 2017-08-31 LAB — CBC WITH DIFFERENTIAL/PLATELET
Basophils Absolute: 0 10*3/uL (ref 0.0–0.1)
Basophils Relative: 0 %
EOS ABS: 0.2 10*3/uL (ref 0.0–0.5)
EOS PCT: 2 %
HCT: 41 % (ref 34.8–46.6)
Hemoglobin: 13 g/dL (ref 11.6–15.9)
LYMPHS ABS: 1.9 10*3/uL (ref 0.9–3.3)
LYMPHS PCT: 23 %
MCH: 28.8 pg (ref 25.1–34.0)
MCHC: 31.7 g/dL (ref 31.5–36.0)
MCV: 90.9 fL (ref 79.5–101.0)
MONO ABS: 0.6 10*3/uL (ref 0.1–0.9)
Monocytes Relative: 7 %
NEUTROS ABS: 5.4 10*3/uL (ref 1.5–6.5)
Neutrophils Relative %: 68 %
PLATELETS: 266 10*3/uL (ref 145–400)
RBC: 4.51 MIL/uL (ref 3.70–5.45)
RDW: 13.5 % (ref 11.2–14.5)
WBC: 8 10*3/uL (ref 3.9–10.3)

## 2017-08-31 MED ORDER — LIDOCAINE-PRILOCAINE 2.5-2.5 % EX CREA: 1.0000 "application " | TOPICAL_CREAM | CUTANEOUS | 6 refills | Status: DC | PRN

## 2017-08-31 MED ORDER — DEXAMETHASONE 4 MG PO TABS: ORAL_TABLET | ORAL | 0 refills | Status: DC

## 2017-08-31 NOTE — Telephone Encounter (Signed)
[id number] Study Questions. Called patient to obtain some medical history required to complete study, which I cannot find in her EMR.   Patient kindly answered the following questions; 1. Q: Has she ever been pregnant? A: Yes. 2. Q: Number of pregnancies? A: 2. 3. Q: Has she been through menopause? A: Yes. 4. Q: What age did menopause occur? A: 53. 5. Q: Has she ever undergone hormone therapy? A: Yes. 6. Q: Number of years on hormone therapy? A: 8 months. 7. Q: Did she ever take fertility drugs? A: No. 8. Q: Has she ever taken androgens? A: No. Thanked patient very much for the information.  Data entered into electronic case report form for this study.  Foye Spurling, BSN, RN Clinical Research Nurse 08/31/2017 3:23 PM

## 2017-08-31 NOTE — Assessment & Plan Note (Signed)
She  is at risk of worsening diabetes control during treatment We discussed dietary modification and close monitoring of blood sugar 

## 2017-08-31 NOTE — Progress Notes (Signed)
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 The intent is of curative intent in a neoadjuvant fashion  We discussed some of the risks, benefits, side-effects of carboplatin & Taxol. Treatment is intravenous, every 3 weeks x 6 cycles  Some of the short term side-effects included, though not limited to, including weight loss, life threatening infections, risk of allergic reactions, need for transfusions of blood products, nausea, vomiting, change in bowel habits, loss of hair, admission to hospital for various reasons, and risks of death.   Long term side-effects are also discussed including risks of infertility, permanent damage to nerve function, hearing loss, chronic fatigue, kidney damage with possibility needing hemodialysis, and rare secondary malignancy including bone marrow disorders.  The patient is aware that the response rates discussed earlier is not guaranteed.  After a long discussion, patient made an informed decision to proceed with the prescribed plan of care.   Patient education material was dispensed. We discussed oral premedication with dexamethasone before chemotherapy to be taken in mildly reduced dose due to her diabetes and anticipated severe hypoglycemia With her advanced age, I recommend G-CSF support I plan to see her back prior to cycle 2 of treatment   Type 2 diabetes mellitus treated without insulin ([place]) She  is at risk of worsening diabetes control during treatment We discussed dietary modification and close monitoring of blood sugar  Chronic nausea She has minimum nausea with recent prescription antiemetics She will continue the same  Left leg swelling The left leg swelling is related to compression of lymph node drainage from her tumor I  reassured the patient We will observe only She has no signs or symptoms to suggest DVT   No orders of the defined types were placed in this encounter.   INTERVAL HISTORY: Please see below for problem oriented charting. She returns with her husband for further follow-up She complained of left leg swelling but without pain or discomfort No chest pain or shortness of breath Her chronic nausea is stable with recent prescription antiemetics She denies constipation Her appetite is fair She complained of discomfort due to abdominal distention from a tumor  SUMMARY OF ONCOLOGIC HISTORY:   Right ovarian epithelial cancer ([place])   07/30/2017 Imaging    US pelvis Ultrasound revealed a complex cystic mass in the right adnexa measuring 20 x 11 x 12 cm with multiple internal septations some of which are thick. The left adnexa measured 12.7 x 11.6 x 8.1 with low level echoes and soft tissue nodules       07/30/2017 Tumor Marker    Patient's tumor was tested for the following markers: CA-125 Results of the tumor marker test revealed 521.3      08/17/2017 Pathology Results    The malignant cells are positive for PAX-8, cytokeratin 7, estrogen receptor, and faintly positive for GATA-3. They are negative for p53, GCDFP, and cytokeratin 20. The finding are consistent with a gynecologic primary carcinoma. Additional studies can be performed upon clinician request.      08/17/2017 Procedure    Technically successful CT-guided left lower quadrant omental mass core biopsy.      08/20/2017 PET scan    1. Cystic masses arising from the pelvis. The nodular component of the RIGHT cystic mass is intensely hypermetabolic consistent with malignant ovarian neoplasm. 2. Extensive  hypermetabolic peritoneal thickening in the lower abdomen and upper pelvis, upper abdomen, and upper abdominal precordial fat and paradiaphragmatic fat. 3. Retroperitoneal nodal metastasis adjacent to the IVC at the level the kidneys. 4.  No evidence of metastatic disease in the thorax other small effusion on the LEFT and nodal metastasis in the fat superior to the diaphragm. 5. Mild metabolic activity associated the distal esophagus is favored benign esophagitis.      08/20/2017 Imaging    CT chest 1. Bilateral cardiophrenic angle nodal metastasis. No additional findings to suggest metastatic disease to the chest. 2. Small left pleural effusion. 3. Peritoneal carcinomatosis noted within the abdomen. 4. Hepatic steatosis.       08/24/2017 Cancer Staging    Staging form: Ovary, Fallopian Tube, and Primary Peritoneal Carcinoma, AJCC 8th Edition - Clinical: Stage IV (cT3, cN1, cM1) - Signed by [name], MD on 08/24/2017       REVIEW OF SYSTEMS:   Constitutional: Denies fevers, chills or abnormal weight loss Eyes: Denies blurriness of vision Ears, nose, mouth, throat, and face: Denies mucositis or sore throat Respiratory: Denies cough, dyspnea or wheezes Cardiovascular: Denies palpitation, chest discomfort or lower extremity swelling Gastrointestinal:  Denies nausea, heartburn or change in bowel habits Skin: Denies abnormal skin rashes Lymphatics: Denies new lymphadenopathy or easy bruising Neurological:Denies numbness, tingling or new weaknesses Behavioral/Psych: Mood is stable, no new changes  All other systems were reviewed with the patient and are negative.  I have reviewed the past medical history, past surgical history, social history and family history with the patient and they are unchanged from previous note.  ALLERGIES:  has No Known Allergies.  MEDICATIONS:  Current Outpatient Medications  Medication Sig Dispense Refill  . acetaminophen (TYLENOL) 500 MG tablet Take 500 mg by mouth every 8 (eight) hours as needed for mild pain or moderate pain.    Marland Kitchen atenolol (TENORMIN) 25 MG tablet Take 25 mg by mouth at bedtime.     Marland Kitchen CALCIUM-MAGNESIUM-VITAMIN D PO Take 1 tablet by mouth daily.    Marland Kitchen dexamethasone  (DECADRON) 4 MG tablet Take 3 tabs the night before chemo and 3 tabs in the morning of chemotherapy, every 3 weeks 36 tablet 0  . lidocaine-prilocaine (EMLA) cream Apply 1 application topically as needed. 30 g 6  . loratadine (CLARITIN) 10 MG tablet Take 10 mg by mouth daily as needed for allergies.    . metFORMIN (GLUCOPHAGE) 1000 MG tablet Take 500 mg by mouth 2 (two) times daily.  0  . Multiple Vitamin (MULTIVITAMIN WITH MINERALS) TABS tablet Take 1 tablet by mouth daily.    . ondansetron (ZOFRAN) 8 MG tablet Take 1 tablet (8 mg total) by mouth every 8 (eight) hours as needed for nausea. 30 tablet 3  . prochlorperazine (COMPAZINE) 10 MG tablet Take 1 tablet (10 mg total) by mouth every 6 (six) hours as needed for nausea or vomiting. 30 tablet 0   No current facility-administered medications for this visit.     PHYSICAL EXAMINATION: ECOG PERFORMANCE STATUS: 1 - Symptomatic but completely ambulatory  Vitals:   08/31/17 1332  BP: 140/66  Pulse: 80  Resp: 17  Temp: 98.1 F (36.7 C)  SpO2: 99%   Filed Weights   08/31/17 1332  Weight: 164 lb 8 oz (74.6 kg)    GENERAL:alert, no distress and comfortable SKIN: skin color, texture, turgor are normal, no rashes or significant lesions HEART:Noted left lower extremity edema ABDOMEN:abdomen soft, distended Musculoskeletal:no cyanosis of digits and no clubbing  NEURO: alert & oriented x 3 with fluent speech, no focal motor/sensory deficits  LABORATORY DATA:  I have reviewed the data as listed    Component Value Date/Time   NA 139 08/31/2017 1118   K 3.9 08/31/2017 1118   CL 106 08/31/2017 1118   CO2 25 08/31/2017 1118   GLUCOSE 140 08/31/2017 1118   BUN 11 08/31/2017 1118   CREATININE 1.07 08/31/2017 1118   CREATININE 0.86 06/05/2014 0957   CALCIUM 9.4 08/31/2017 1118   PROT 7.7 08/31/2017 1118   ALBUMIN 3.8 08/31/2017 1118   AST 16 08/31/2017 1118   ALT 12 08/31/2017 1118   ALKPHOS 67 08/31/2017 1118   BILITOT 0.4 08/31/2017  1118   GFRNONAA 52 (L) 08/31/2017 1118   GFRAA >60 08/31/2017 1118    No results found for: SPEP, UPEP  Lab Results  Component Value Date   WBC 8.0 08/31/2017   NEUTROABS 5.4 08/31/2017   HGB 13.0 08/31/2017   HCT 41.0 08/31/2017   MCV 90.9 08/31/2017   PLT 266 08/31/2017      Chemistry      Component Value Date/Time   NA 139 08/31/2017 1118   K 3.9 08/31/2017 1118   CL 106 08/31/2017 1118   CO2 25 08/31/2017 1118   BUN 11 08/31/2017 1118   CREATININE 1.07 08/31/2017 1118   CREATININE 0.86 06/05/2014 0957      Component Value Date/Time   CALCIUM 9.4 08/31/2017 1118   ALKPHOS 67 08/31/2017 1118   AST 16 08/31/2017 1118   ALT 12 08/31/2017 1118   BILITOT 0.4 08/31/2017 1118       RADIOGRAPHIC STUDIES: I have personally reviewed the radiological images as listed and agreed with the findings in the report. Ct Chest W Contrast  Result Date: 08/21/2017 CLINICAL DATA:  Abdominal pain and bloating. Probable ovarian cancer. EXAM: CT CHEST WITH CONTRAST TECHNIQUE: Multidetector CT imaging of the chest was performed during intravenous contrast administration. CONTRAST:  65m OMNIPAQUE IOHEXOL 300 MG/ML  SOLN COMPARISON:  08/20/2017 FINDINGS: Cardiovascular: Normal heart size.  No pericardial effusion. Mediastinum/Nodes: Normal appearance of the thyroid gland. The trachea appears patent and is midline. Normal appearance of the esophagus. No mediastinal or hilar adenopathy. Left cardiophrenic angle node measures 1.3 cm, image 110/2. Large right CP angle node measures 1.9 cm, image 104/2. Lungs/Pleura: Small left pleural effusion is identified. Subpleural atelectasis in the left base noted. Subsegmental atelectasis within the anterior right base noted. No suspicious pulmonary nodules. Upper Abdomen: Extensive peritoneal carcinomatosis identified within the visualized portions of the upper abdomen. Diffuse hepatic steatosis noted. Musculoskeletal: Spondylosis within the thoracic spine.  IMPRESSION: 1. Bilateral cardiophrenic angle nodal metastasis. No additional findings to suggest metastatic disease to the chest. 2. Small left pleural effusion. 3. Peritoneal carcinomatosis noted within the abdomen. 4. Hepatic steatosis. Electronically Signed   By: TKerby MoorsM.D.   On: 08/21/2017 09:09   Nm Pet Image Initial (pi) Skull Base To Thigh  Result Date: 08/20/2017 CLINICAL DATA:  Initial treatment strategy for ovarian masses with peritoneal metastasis. EXAM: NUCLEAR MEDICINE PET SKULL BASE TO THIGH TECHNIQUE: 7.9 mCi F-18 FDG was injected intravenously. Full-ring PET imaging was performed from the skull base to thigh after the radiotracer. CT data was obtained and used for attenuation correction and anatomic localization. Fasting blood glucose: 94 mg/dl COMPARISON:  MRI 08/02/2017 FINDINGS: Mediastinal blood pool activity: SUV max 2.1 NECK: No hypermetabolic lymph nodes in the neck. Incidental CT findings: none CHEST: Mild metabolic activity diffusely in  the distal esophagus without focal lesion. No suspicious pulmonary nodules. No hypermetabolic mediastinal lymph nodes. Incidental CT findings: Small LEFT effusion ABDOMEN/PELVIS: Large cystic lesion arising from the LEFT and RIGHT ovaries. Along the inferior margin of the larger RIGHT cystic lesion there is hypermetabolic mural nodularity with SUV max equal 15.1. There is hypermetabolic peritoneal thickening in the ventral peritoneal surface of the upper pelvis with SUV max equal 10.3. Hypermetabolic nodule implants scattered within the peritoneal space. Example nodule along the inferior margin of the medial RIGHT hepatic lobe with SUV max equal 12.3 and measuring 22 mm on image 104/4. Hypermetabolic metastasis extend into precordial fat and fat superior to the diaphragm. The most superior nodule is in the precordial fat adjacent to the RIGHT atrium measuring 2.1 cm with SUV max equal 11.3 (image 73/4). Nodule in the anterior fat If the patient  is low risk for carcinoma, recommend follow-up noncontrast CT in 12 months. If high risk, recommend follow-up in 6 to 12 months per Fleischner criteria. In the LEFT upper quadrant in the fat anterior to the diaphragm rounded lesion with SUV max equal 13.1 measures 13 mm on image 84/4. No focal lesion within the parenchyma of the liver. Hypermetabolic retroperitoneal lymph node posterior to the IVC at the level the kidneys with SUV max equal 13.0 (12 mm image 96/4). Post hysterectomy anatomy Incidental CT findings: No bowel obstruction. Post hysterectomy. No ureteral obstruction. SKELETON: No focal hypermetabolic activity to suggest skeletal metastasis. Incidental CT findings: none IMPRESSION: 1. Cystic masses arising from the pelvis. The nodular component of the RIGHT cystic mass is intensely hypermetabolic consistent with malignant ovarian neoplasm. 2. Extensive hypermetabolic peritoneal thickening in the lower abdomen and upper pelvis, upper abdomen, and upper abdominal precordial fat and paradiaphragmatic fat. 3. Retroperitoneal nodal metastasis adjacent to the IVC at the level the kidneys. 4. No evidence of metastatic disease in the thorax other small effusion on the LEFT and nodal metastasis in the fat superior to the diaphragm. 5. Mild metabolic activity associated the distal esophagus is favored benign esophagitis. Electronically Signed   By: Suzy Bouchard M.D.   On: 08/20/2017 17:22   Ct Biopsy  Result Date: 08/17/2017 CLINICAL DATA:  adnexal lesions (right greater than left), with apparent intraperitoneal spread of disease as evidenced by multiple enhancing peritoneal nodules and masses, as well as probable serosal implants associated with the surface of the liver. Probable lymphadenopathy is also noted in the upper abdomen, as well as in the lower anterior mediastinum EXAM: CT GUIDED CORE BIOPSY OF OMENTAL MASS ANESTHESIA/SEDATION: Intravenous Fentanyl and Versed were administered as conscious  sedation during continuous monitoring of the patient's level of consciousness and physiological / cardiorespiratory status by the radiology RN, with a total moderate sedation time of 10 minutes. PROCEDURE: The procedure risks, benefits, and alternatives were explained to the patient. Questions regarding the procedure were encouraged and answered. The patient understands and consents to the procedure. Select axial scans through the lower abdomen were obtained. Anterior left omental mass localized and an appropriate skin entry site identified and marked. The operative field was prepped with chlorhexidinein a sterile fashion, and a sterile drape was applied covering the operative field. A sterile gown and sterile gloves were used for the procedure. Local anesthesia was provided with 1% Lidocaine. Under CT fluoroscopic guidance, a 17 gauge trocar needle was advanced to the margin of the lesion. Once needle tip position was confirmed, coaxial 18-gauge core biopsy samples were obtained, submitted in formalin to surgical pathology. The  guide needle was removed. Postprocedure scans show no hemorrhage or other apparent complication. COMPLICATIONS: None immediate FINDINGS: Bilateral cystic pelvic masses right greater than left and left lower quadrant omental disease was localized. Representative core biopsy samples of the omental disease obtained as above. IMPRESSION: 1. Technically successful CT-guided left lower quadrant omental mass core biopsy. Electronically Signed   By: Lucrezia Europe M.D.   On: 08/17/2017 12:05    All questions were answered. The patient knows to call the clinic with any problems, questions or concerns. No barriers to learning was detected.  I spent 25 minutes counseling the patient face to face. The total time spent in the appointment was 30 minutes and more than 50% was on counseling and review of test results  [name], MD 08/31/2017 2:31 PM

## 2017-08-31 NOTE — Assessment & Plan Note (Signed)
The left leg swelling is related to compression of lymph node drainage from her tumor I reassured the patient We will observe only She has no signs or symptoms to suggest DVT

## 2017-08-31 NOTE — Assessment & Plan Note (Signed)
We reviewed the NCCN guidelines We discussed the role of chemotherapy. The intent is of curative intent in a neoadjuvant fashion  We discussed some of the risks, benefits, side-effects of carboplatin & Taxol. Treatment is intravenous, every 3 weeks x 6 cycles  Some of the short term side-effects included, though not limited to, including weight loss, life threatening infections, risk of allergic reactions, need for transfusions of blood products, nausea, vomiting, change in bowel habits, loss of hair, admission to hospital for various reasons, and risks of death.   Long term side-effects are also discussed including risks of infertility, permanent damage to nerve function, hearing loss, chronic fatigue, kidney damage with possibility needing hemodialysis, and rare secondary malignancy including bone marrow disorders.  The patient is aware that the response rates discussed earlier is not guaranteed.  After a long discussion, patient made an informed decision to proceed with the prescribed plan of care.   Patient education material was dispensed. We discussed oral premedication with dexamethasone before chemotherapy to be taken in mildly reduced dose due to her diabetes and anticipated severe hypoglycemia With her advanced age, I recommend G-CSF support I plan to see her back prior to cycle 2 of treatment

## 2017-08-31 NOTE — Assessment & Plan Note (Signed)
She has minimum nausea with recent prescription antiemetics She will continue the same

## 2017-09-01 LAB — CA 125: Cancer Antigen (CA) 125: 819.9 U/mL — ABNORMAL HIGH (ref 0.0–38.1)

## 2017-09-03 ENCOUNTER — Encounter (HOSPITAL_COMMUNITY): Payer: Self-pay

## 2017-09-03 ENCOUNTER — Ambulatory Visit (HOSPITAL_COMMUNITY)
Admission: RE | Admit: 2017-09-03 | Discharge: 2017-09-03 | Disposition: A | Discharge status: Home / Self Care | Payer: Medicare HMO | Source: Ambulatory Visit | Attending: Obstetrics | Admitting: Obstetrics

## 2017-09-03 ENCOUNTER — Other Ambulatory Visit: Payer: Self-pay | Admitting: Obstetrics

## 2017-09-03 DIAGNOSIS — E119 Type 2 diabetes mellitus without complications: Secondary | ICD-10-CM | POA: Insufficient documentation

## 2017-09-03 DIAGNOSIS — C561 Malignant neoplasm of right ovary: Secondary | ICD-10-CM

## 2017-09-03 DIAGNOSIS — Z5111 Encounter for antineoplastic chemotherapy: Secondary | ICD-10-CM | POA: Diagnosis not present

## 2017-09-03 DIAGNOSIS — I10 Essential (primary) hypertension: Secondary | ICD-10-CM | POA: Insufficient documentation

## 2017-09-03 DIAGNOSIS — M8580 Other specified disorders of bone density and structure, unspecified site: Secondary | ICD-10-CM | POA: Diagnosis not present

## 2017-09-03 DIAGNOSIS — Z9889 Other specified postprocedural states: Secondary | ICD-10-CM | POA: Insufficient documentation

## 2017-09-03 DIAGNOSIS — Z79899 Other long term (current) drug therapy: Secondary | ICD-10-CM | POA: Insufficient documentation

## 2017-09-03 DIAGNOSIS — Z9049 Acquired absence of other specified parts of digestive tract: Secondary | ICD-10-CM | POA: Diagnosis not present

## 2017-09-03 DIAGNOSIS — Z90710 Acquired absence of both cervix and uterus: Secondary | ICD-10-CM | POA: Insufficient documentation

## 2017-09-03 DIAGNOSIS — Z7984 Long term (current) use of oral hypoglycemic drugs: Secondary | ICD-10-CM | POA: Insufficient documentation

## 2017-09-03 DIAGNOSIS — C569 Malignant neoplasm of unspecified ovary: Secondary | ICD-10-CM | POA: Diagnosis not present

## 2017-09-03 HISTORY — PX: IR US GUIDE VASC ACCESS RIGHT: IMG2390

## 2017-09-03 HISTORY — PX: IR FLUORO GUIDE PORT INSERTION RIGHT: IMG5741

## 2017-09-03 LAB — CBC WITH DIFFERENTIAL/PLATELET
BASOS PCT: 1 %
Basophils Absolute: 0.1 10*3/uL (ref 0.0–0.1)
Eosinophils Absolute: 0.1 10*3/uL (ref 0.0–0.7)
Eosinophils Relative: 2 %
HEMATOCRIT: 40.3 % (ref 36.0–46.0)
HEMOGLOBIN: 13 g/dL (ref 12.0–15.0)
LYMPHS PCT: 30 %
Lymphs Abs: 2.7 10*3/uL (ref 0.7–4.0)
MCH: 29.1 pg (ref 26.0–34.0)
MCHC: 32.3 g/dL (ref 30.0–36.0)
MCV: 90.2 fL (ref 78.0–100.0)
MONO ABS: 0.6 10*3/uL (ref 0.1–1.0)
Monocytes Relative: 7 %
NEUTROS ABS: 5.5 10*3/uL (ref 1.7–7.7)
NEUTROS PCT: 60 %
PLATELETS: 261 10*3/uL (ref 150–400)
RBC: 4.47 MIL/uL (ref 3.87–5.11)
RDW: 13.3 % (ref 11.5–15.5)
WBC: 9 10*3/uL (ref 4.0–10.5)

## 2017-09-03 LAB — BASIC METABOLIC PANEL
ANION GAP: 13 (ref 5–15)
BUN: 13 mg/dL (ref 6–20)
CO2: 22 mmol/L (ref 22–32)
Calcium: 9.4 mg/dL (ref 8.9–10.3)
Chloride: 103 mmol/L (ref 101–111)
Creatinine, Ser: 0.91 mg/dL (ref 0.44–1.00)
GLUCOSE: 101 mg/dL — AB (ref 65–99)
POTASSIUM: 3.9 mmol/L (ref 3.5–5.1)
Sodium: 138 mmol/L (ref 135–145)

## 2017-09-03 LAB — GLUCOSE, CAPILLARY: GLUCOSE-CAPILLARY: 94 mg/dL (ref 65–99)

## 2017-09-03 LAB — PROTIME-INR
INR: 1.01
Prothrombin Time: 13.2 seconds (ref 11.4–15.2)

## 2017-09-03 MED ORDER — MIDAZOLAM HCL 2 MG/2ML IJ SOLN
INTRAMUSCULAR | Status: AC | PRN
  Administered 2017-09-03 (×4): 1 mg via INTRAVENOUS

## 2017-09-03 MED ORDER — MIDAZOLAM HCL 2 MG/2ML IJ SOLN
INTRAMUSCULAR | Status: AC
  Filled 2017-09-03: qty 4

## 2017-09-03 MED ORDER — FENTANYL CITRATE (PF) 100 MCG/2ML IJ SOLN
INTRAMUSCULAR | Status: AC
  Filled 2017-09-03: qty 2

## 2017-09-03 MED ORDER — LIDOCAINE-EPINEPHRINE (PF) 2 %-1:200000 IJ SOLN
INTRAMUSCULAR | Status: AC
  Filled 2017-09-03: qty 20

## 2017-09-03 MED ORDER — HEPARIN SOD (PORK) LOCK FLUSH 100 UNIT/ML IV SOLN
INTRAVENOUS | Status: AC
  Filled 2017-09-03: qty 5

## 2017-09-03 MED ORDER — CEFAZOLIN SODIUM-DEXTROSE 2-4 GM/100ML-% IV SOLN
INTRAVENOUS | Status: AC
  Filled 2017-09-03: qty 100

## 2017-09-03 MED ORDER — LIDOCAINE-EPINEPHRINE (PF) 2 %-1:200000 IJ SOLN
INTRAMUSCULAR | Status: AC | PRN
  Administered 2017-09-03: 20 mL

## 2017-09-03 MED ORDER — SODIUM CHLORIDE 0.9 % IV SOLN
INTRAVENOUS | Status: DC
  Administered 2017-09-03: 13:00:00 via INTRAVENOUS

## 2017-09-03 MED ORDER — CEFAZOLIN SODIUM-DEXTROSE 2-4 GM/100ML-% IV SOLN
2.0000 g | INTRAVENOUS | Status: AC
  Administered 2017-09-03: 2 g via INTRAVENOUS

## 2017-09-03 MED ORDER — FENTANYL CITRATE (PF) 100 MCG/2ML IJ SOLN
INTRAMUSCULAR | Status: AC | PRN
  Administered 2017-09-03 (×2): 50 ug via INTRAVENOUS

## 2017-09-03 MED ORDER — HEPARIN SOD (PORK) LOCK FLUSH 100 UNIT/ML IV SOLN
INTRAVENOUS | Status: AC | PRN
  Administered 2017-09-03: 500 [IU] via INTRAVENOUS

## 2017-09-03 MED ORDER — LIDOCAINE HCL (PF) 1 % IJ SOLN
INTRAMUSCULAR | Status: AC | PRN
  Administered 2017-09-03: 5 mL

## 2017-09-03 NOTE — H&P (Addendum)
Referring Physician(s): Gorsuch,N  Supervising Physician: Daryll Brod  Patient Status:  [facility] OP  Chief Complaint:  "I'm here to get a port a cath"  Subjective: Patient familiar to IR service from prior omental mass biopsy on 08/17/2017.  She has a history of right ovarian epithelial carcinoma and presents again today for Port-A-Cath placement for chemotherapy.  She currently denies fever, headache, chest pain, cough, vomiting or abnormal bleeding.  She does have some dyspnea, abdominal bloating/discomfort, intermittent back pain as well as occasional nausea.  Past Medical History:  Diagnosis Date  . Diabetes mellitus without complication ([place])   . Diverticulosis   . Hypertension   . Osteopenia   . Ovarian cancer ([place])   . Pancreatic cyst    Past Surgical History:  Procedure Laterality Date  . ABDOMINAL HYSTERECTOMY    . CHOLECYSTECTOMY N/A 07/06/2014   Procedure: LAPAROSCOPIC CHOLECYSTECTOMY;  Surgeon: Aviva Signs Md, MD;  Location: [facility] ORS;  Service: General;  Laterality: N/A;  . COLONOSCOPY N/A 06/26/2013   Procedure: COLONOSCOPY;  Surgeon: Rogene Houston, MD;  Location: [facility] END[facility] SUITE;  Service: Endoscopy;  Laterality: N/A;  1030  . VAGINAL HYSTERECTOMY  2011    Allergies: Patient has no known allergies.  Medications: Prior to Admission medications   Medication Sig Start Date End Date Taking? Authorizing Provider  acetaminophen (TYLENOL) 500 MG tablet Take 500 mg by mouth every 8 (eight) hours as needed for mild pain or moderate pain.    [provider]  atenolol (TENORMIN) 25 MG tablet Take 25 mg by mouth at bedtime.     [provider]  CALCIUM-MAGNESIUM-VITAMIN D PO Take 1 tablet by mouth daily.    [provider]  dexamethasone (DECADRON) 4 MG tablet Take 3 tabs the night before chemo and 3 tabs in the morning of chemotherapy, every 3 weeks 08/31/17   [name], MD  lidocaine-prilocaine (EMLA) cream Apply 1 application topically as  needed. 08/31/17   [name], MD  loratadine (CLARITIN) 10 MG tablet Take 10 mg by mouth daily as needed for allergies.    [provider]  metFORMIN (GLUCOPHAGE) 1000 MG tablet Take 500 mg by mouth 2 (two) times daily. 05/21/14   [provider]  Multiple Vitamin (MULTIVITAMIN WITH MINERALS) TABS tablet Take 1 tablet by mouth daily.    [provider]  ondansetron (ZOFRAN) 8 MG tablet Take 1 tablet (8 mg total) by mouth every 8 (eight) hours as needed for nausea. 08/24/17   [name], MD  prochlorperazine (COMPAZINE) 10 MG tablet Take 1 tablet (10 mg total) by mouth every 6 (six) hours as needed for nausea or vomiting. 08/24/17   [name], MD     Vital Signs: Blood pressure 141/90, heart rate 87, temperature 98.5, respirations 18, O2 sat 96% room air   Physical Exam awake, alert.  Chest with slightly diminished breath sounds left base, right clear.  Heart with regular rate and rhythm.  Abdomen distended, positive bowel sounds, mild generalized tenderness to palpation; left greater than right lower extremity edema  Imaging: No results found.  Labs:  CBC: Recent Labs    08/17/17 0640 08/31/17 1118  WBC 8.6 8.0  HGB 13.3 13.0  HCT 41.2 41.0  PLT 256 266    COAGS: Recent Labs    08/17/17 0640  INR 0.98    BMP: Recent Labs    08/31/17 1118  NA 139  K 3.9  CL 106  CO2 25  GLUCOSE 140  BUN 11  CALCIUM 9.4  CREATININE 1.07  GFRNONAA 52*  GFRAA >60    LIVER FUNCTION TESTS: Recent Labs    08/31/17 1118  BILITOT 0.4  AST 16  ALT 12  ALKPHOS 67  PROT 7.7  ALBUMIN 3.8    Assessment and Plan:  Pt with history of right ovarian epithelial carcinoma ;presents  today for Port-A-Cath placement for chemotherapy.Risks and benefits of image guided port-a-catheter placement was discussed with the patient including, but not limited to bleeding, infection, pneumothorax, or fibrin sheath development and need for additional procedures.  All of the  patient's questions were answered, patient is agreeable to proceed. Consent signed and in chart.  Labs pending   Electronically Signed: D. Rowe Robert, PA-C 09/03/2017, 12:47 PM   I spent a total of 20 minutes at the the patient's bedside AND on the patient's hospital floor or unit, greater than 50% of which was counseling/coordinating care for Port-A-Cath placement

## 2017-09-03 NOTE — Procedures (Signed)
Ovarian ca  S/p RT IJ POWER PORT  Tip svcra No comp Stable EBL MIN Ready for [place] Full report in pacs

## 2017-09-03 NOTE — Discharge Instructions (Signed)
Moderate Conscious Sedation, Adult, Care After These instructions provide you with information about caring for yourself after your procedure. Your health care provider may also give you more specific instructions. Your treatment has been planned according to current medical practices, but problems sometimes occur. Call your health care provider if you have any problems or questions after your procedure. What can I expect after the procedure? After your procedure, it is common:  To feel sleepy for several hours.  To feel clumsy and have poor balance for several hours.  To have poor judgment for several hours.  To vomit if you eat too soon.  Follow these instructions at home: For at least 24 hours after the procedure:   Do not: ? Participate in activities where you could fall or become injured. ? Drive. ? Use heavy machinery. ? Drink alcohol. ? Take sleeping pills or medicines that cause drowsiness. ? Make important decisions or sign legal documents. ? Take care of children on your own.  Rest. Eating and drinking  Follow the diet recommended by your health care provider.  If you vomit: ? Drink water, juice, or soup when you can drink without vomiting. ? Make sure you have little or no nausea before eating solid foods. General instructions  Have a responsible adult stay with you until you are awake and alert.  Take over-the-counter and prescription medicines only as told by your health care provider.  If you smoke, do not smoke without supervision.  Keep all follow-up visits as told by your health care provider. This is important. Contact a health care provider if:  You keep feeling nauseous or you keep vomiting.  You feel light-headed.  You develop a rash.  You have a fever. Get help right away if:  You have trouble breathing. This information is not intended to replace advice given to you by your health care provider. Make sure you discuss any questions you have  with your health care provider. Document Released: 01/29/2013 Document Revised: 09/13/2015 Document Reviewed: 07/31/2015 Elsevier Interactive Patient Education  2018 [employer] Insert[employer], Care After This sheet gives you information about how to care for yourself after your procedure. Your health care provider may also give you more specific instructions. If you have problems or questions, contact your health care provider. What can I expect after the procedure? After your procedure, it is common to have:  Discomfort at the port insertion site.  Bruising on the skin over the port. This should improve over 3-4 days.  Follow these instructions at home: [facility] care  After your port is placed, you will get a manufacturer's information card. The card has information about your port. Keep this card with you at all times.  Take care of the port as told by your health care provider. Ask your health care provider if you or a family member can get training for taking care of the port at home. A home health care nurse may also take care of the port.  Make sure to remember what type of port you have. Incision care  Follow instructions from your health care provider about how to take care of your port insertion site. Make sure you: ? Wash your hands with soap and water before you change your bandage (dressing). If soap and water are not available, use hand sanitizer. ? Change your dressing as told by your health care provider.  You may remove your dressing tomorrow. ? Leave skin glue in place. These skin  closures may need to stay in place for 2 weeks or longer. If adhesive strip edges start to loosen and curl up, you may trim the loose edges. Do not remove adhesive strips completely unless your health care provider tells you to do that.  DO NOT USE EMLA cream for 2 weeks after port placement as this cream will remove surgical glue on your incision.  Check your port insertion site  every day for signs of infection. Check for: ? More redness, swelling, or pain. ? More fluid or blood. ? Warmth. ? Pus or a bad smell. General instructions  Do not take baths, swim, or use a hot tub until your health care provider approves.  You may shower tomorrow.  Do not lift anything that is heavier than 10 lb (4.5 kg) for a week, or as told by your health care provider.  Ask your health care provider when it is okay to: ? Return to work or school. ? Resume usual physical activities or sports.  Do not drive for 24 hours if you were given a medicine to help you relax (sedative).  Take over-the-counter and prescription medicines only as told by your health care provider.  Wear a medical alert bracelet in case of an emergency. This will tell any health care providers that you have a port.  Keep all follow-up visits as told by your health care provider. This is important. Contact a health care provider if:  You have a fever or chills.  You have more redness, swelling, or pain around your port insertion site.  You have more fluid or blood coming from your port insertion site.  Your port insertion site feels warm to the touch.  You have pus or a bad smell coming from the port insertion site. Get help right away if:  You have chest pain or shortness of breath.  You have bleeding from your port that you cannot control. Summary  Take care of the port as told by your health care provider.  Change your dressing as told by your health care provider.  Keep all follow-up visits as told by your health care provider. This information is not intended to replace advice given to you by your health care provider. Make sure you discuss any questions you have with your health care provider. Document Released: 01/29/2013 Document Revised: 03/01/2016 Document Reviewed: 03/01/2016 Elsevier Interactive Patient Education  2017 [employer].

## 2017-09-04 NOTE — Progress Notes (Signed)
Prior auth for EMLA cream has been submitted. Status is pending.

## 2017-09-05 ENCOUNTER — Inpatient Hospital Stay: Payer: Medicare HMO

## 2017-09-05 VITALS — BP 149/87 | HR 89 | Temp 98.5°F | Resp 16

## 2017-09-05 DIAGNOSIS — M7989 Other specified soft tissue disorders: Secondary | ICD-10-CM | POA: Diagnosis not present

## 2017-09-05 DIAGNOSIS — Z5189 Encounter for other specified aftercare: Secondary | ICD-10-CM | POA: Diagnosis not present

## 2017-09-05 DIAGNOSIS — C561 Malignant neoplasm of right ovary: Secondary | ICD-10-CM | POA: Diagnosis not present

## 2017-09-05 DIAGNOSIS — Z5111 Encounter for antineoplastic chemotherapy: Secondary | ICD-10-CM | POA: Diagnosis not present

## 2017-09-05 DIAGNOSIS — M8580 Other specified disorders of bone density and structure, unspecified site: Secondary | ICD-10-CM | POA: Diagnosis not present

## 2017-09-05 DIAGNOSIS — Z90710 Acquired absence of both cervix and uterus: Secondary | ICD-10-CM | POA: Diagnosis not present

## 2017-09-05 DIAGNOSIS — E119 Type 2 diabetes mellitus without complications: Secondary | ICD-10-CM | POA: Diagnosis not present

## 2017-09-05 DIAGNOSIS — R590 Localized enlarged lymph nodes: Secondary | ICD-10-CM

## 2017-09-05 DIAGNOSIS — G893 Neoplasm related pain (acute) (chronic): Secondary | ICD-10-CM | POA: Diagnosis not present

## 2017-09-05 DIAGNOSIS — R110 Nausea: Secondary | ICD-10-CM | POA: Diagnosis not present

## 2017-09-05 DIAGNOSIS — C786 Secondary malignant neoplasm of retroperitoneum and peritoneum: Secondary | ICD-10-CM | POA: Diagnosis not present

## 2017-09-05 MED ORDER — PALONOSETRON HCL INJECTION 0.25 MG/5ML
INTRAVENOUS | Status: AC
  Filled 2017-09-05: qty 5

## 2017-09-05 MED ORDER — FAMOTIDINE IN NACL 20-0.9 MG/50ML-% IV SOLN
20.0000 mg | Freq: Once | INTRAVENOUS | Status: AC
  Administered 2017-09-05: 20 mg via INTRAVENOUS

## 2017-09-05 MED ORDER — PALONOSETRON HCL INJECTION 0.25 MG/5ML
0.2500 mg | Freq: Once | INTRAVENOUS | Status: AC
  Administered 2017-09-05: 0.25 mg via INTRAVENOUS

## 2017-09-05 MED ORDER — PACLITAXEL CHEMO INJECTION 300 MG/50ML
175.0000 mg/m2 | Freq: Once | INTRAVENOUS | Status: AC
  Administered 2017-09-05: 324 mg via INTRAVENOUS
  Filled 2017-09-05: qty 54

## 2017-09-05 MED ORDER — FOSAPREPITANT DIMEGLUMINE INJECTION 150 MG
Freq: Once | INTRAVENOUS | Status: AC
  Administered 2017-09-05: 09:00:00 via INTRAVENOUS
  Filled 2017-09-05: qty 5

## 2017-09-05 MED ORDER — DIPHENHYDRAMINE HCL 50 MG/ML IJ SOLN
50.0000 mg | Freq: Once | INTRAMUSCULAR | Status: AC
  Administered 2017-09-05: 50 mg via INTRAVENOUS

## 2017-09-05 MED ORDER — DIPHENHYDRAMINE HCL 50 MG/ML IJ SOLN
INTRAMUSCULAR | Status: AC
Start: 2017-09-05 — End: ?
  Filled 2017-09-05: qty 1

## 2017-09-05 MED ORDER — SODIUM CHLORIDE 0.9% FLUSH
10.0000 mL | INTRAVENOUS | Status: DC | PRN
  Administered 2017-09-05: 10 mL
  Filled 2017-09-05: qty 10

## 2017-09-05 MED ORDER — SODIUM CHLORIDE 0.9 % IV SOLN
535.8000 mg | Freq: Once | INTRAVENOUS | Status: AC
  Administered 2017-09-05: 540 mg via INTRAVENOUS
  Filled 2017-09-05: qty 54

## 2017-09-05 MED ORDER — HEPARIN SOD (PORK) LOCK FLUSH 100 UNIT/ML IV SOLN
500.0000 [IU] | Freq: Once | INTRAVENOUS | Status: AC | PRN
  Administered 2017-09-05: 500 [IU]
  Filled 2017-09-05: qty 5

## 2017-09-05 MED ORDER — FAMOTIDINE IN NACL 20-0.9 MG/50ML-% IV SOLN
INTRAVENOUS | Status: AC
  Filled 2017-09-05: qty 50

## 2017-09-05 MED ORDER — SODIUM CHLORIDE 0.9 % IV SOLN
Freq: Once | INTRAVENOUS | Status: AC
  Administered 2017-09-05: 09:00:00 via INTRAVENOUS

## 2017-09-05 NOTE — Progress Notes (Signed)
Patient reported that she took the 3 Decadron tablets last night as ordered with food and experienced nausea and indigestion. This morning she took Zofran 8 mg with the 3 tablets of Decadron. Tammi RN made aware. Also spoke with Arbie Cookey, pharmacist, regarding Aloxi premed administration. Will hold Aloxi admin until after the Taxoll and premedicate prior to the Carbo.

## 2017-09-05 NOTE — Patient Instructions (Signed)
[facility] Discharge Instructions for Patients Receiving Chemotherapy  Today you received the following chemotherapy agents: Taxol, Carbo  To help prevent nausea and vomiting after your treatment, we encourage you to take your nausea medication as directed.   If you develop nausea and vomiting that is not controlled by your nausea medication, call the clinic.   BELOW ARE SYMPTOMS THAT SHOULD BE REPORTED IMMEDIATELY:  *FEVER GREATER THAN 100.5 F  *CHILLS WITH OR WITHOUT FEVER  NAUSEA AND VOMITING THAT IS NOT CONTROLLED WITH YOUR NAUSEA MEDICATION  *UNUSUAL SHORTNESS OF BREATH  *UNUSUAL BRUISING OR BLEEDING  TENDERNESS IN MOUTH AND THROAT WITH OR WITHOUT PRESENCE OF ULCERS  *URINARY PROBLEMS  *BOWEL PROBLEMS  UNUSUAL RASH Items with * indicate a potential emergency and should be followed up as soon as possible.  Feel free to call the clinic should you have any questions or concerns. The clinic phone number is [phone].  Please show the CHEMO ALERT CARD at check-in to the Emergency Department and triage nurse.  Paclitaxel injection What is this medicine? PACLITAXEL (PAK li TAX el) is a chemotherapy drug. It targets fast dividing cells, like cancer cells, and causes these cells to die. This medicine is used to treat ovarian cancer, breast cancer, and other cancers. This medicine may be used for other purposes; ask your health care provider or pharmacist if you have questions. COMMON BRAND NAME(S): Onxol, Taxol What should I tell my health care provider before I take this medicine? They need to know if you have any of these conditions: -blood disorders -irregular heartbeat -infection (especially a virus infection such as chickenpox, cold sores, or herpes) -liver disease -previous or ongoing radiation therapy -an unusual or allergic reaction to paclitaxel, alcohol, polyoxyethylated castor oil, other chemotherapy agents, other medicines, foods, dyes, or  preservatives -pregnant or trying to get pregnant -breast-feeding How should I use this medicine? This drug is given as an infusion into a vein. It is administered in a hospital or clinic by a specially trained health care professional. Talk to your pediatrician regarding the use of this medicine in children. Special care may be needed. Overdosage: If you think you have taken too much of this medicine contact a poison control center or emergency room at once. NOTE: This medicine is only for you. Do not share this medicine with others. What if I miss a dose? It is important not to miss your dose. Call your doctor or health care professional if you are unable to keep an appointment. What may interact with this medicine? Do not take this medicine with any of the following medications: -disulfiram -metronidazole This medicine may also interact with the following medications: -cyclosporine -diazepam -ketoconazole -medicines to increase blood counts like filgrastim, pegfilgrastim, sargramostim -other chemotherapy drugs like cisplatin, doxorubicin, epirubicin, etoposide, teniposide, vincristine -quinidine -testosterone -vaccines -verapamil Talk to your doctor or health care professional before taking any of these medicines: -acetaminophen -aspirin -ibuprofen -ketoprofen -naproxen This list may not describe all possible interactions. Give your health care provider a list of all the medicines, herbs, non-prescription drugs, or dietary supplements you use. Also tell them if you smoke, drink alcohol, or use illegal drugs. Some items may interact with your medicine. What should I watch for while using this medicine? Your condition will be monitored carefully while you are receiving this medicine. You will need important blood work done while you are taking this medicine. This medicine can cause serious allergic reactions. To reduce your risk you will need to   take other medicine(s) before  treatment with this medicine. If you experience allergic reactions like skin rash, itching or hives, swelling of the face, lips, or tongue, tell your doctor or health care professional right away. In some cases, you may be given additional medicines to help with side effects. Follow all directions for their use. This drug may make you feel generally unwell. This is not uncommon, as chemotherapy can affect healthy cells as well as cancer cells. Report any side effects. Continue your course of treatment even though you feel ill unless your doctor tells you to stop. Call your doctor or health care professional for advice if you get a fever, chills or sore throat, or other symptoms of a cold or flu. Do not treat yourself. This drug decreases your body's ability to fight infections. Try to avoid being around people who are sick. This medicine may increase your risk to bruise or bleed. Call your doctor or health care professional if you notice any unusual bleeding. Be careful brushing and flossing your teeth or using a toothpick because you may get an infection or bleed more easily. If you have any dental work done, tell your dentist you are receiving this medicine. Avoid taking products that contain aspirin, acetaminophen, ibuprofen, naproxen, or ketoprofen unless instructed by your doctor. These medicines may hide a fever. Do not become pregnant while taking this medicine. Women should inform their doctor if they wish to become pregnant or think they might be pregnant. There is a potential for serious side effects to an unborn child. Talk to your health care professional or pharmacist for more information. Do not breast-feed an infant while taking this medicine. Men are advised not to father a child while receiving this medicine. This product may contain alcohol. Ask your pharmacist or healthcare provider if this medicine contains alcohol. Be sure to tell all healthcare providers you are taking this medicine.  Certain medicines, like metronidazole and disulfiram, can cause an unpleasant reaction when taken with alcohol. The reaction includes flushing, headache, nausea, vomiting, sweating, and increased thirst. The reaction can last from 30 minutes to several hours. What side effects may I notice from receiving this medicine? Side effects that you should report to your doctor or health care professional as soon as possible: -allergic reactions like skin rash, itching or hives, swelling of the face, lips, or tongue -low blood counts - This drug may decrease the number of white blood cells, red blood cells and platelets. You may be at increased risk for infections and bleeding. -signs of infection - fever or chills, cough, sore throat, pain or difficulty passing urine -signs of decreased platelets or bleeding - bruising, pinpoint red spots on the skin, black, tarry stools, nosebleeds -signs of decreased red blood cells - unusually weak or tired, fainting spells, lightheadedness -breathing problems -chest pain -high or low blood pressure -mouth sores -nausea and vomiting -pain, swelling, redness or irritation at the injection site -pain, tingling, numbness in the hands or feet -slow or irregular heartbeat -swelling of the ankle, feet, hands Side effects that usually do not require medical attention (report to your doctor or health care professional if they continue or are bothersome): -bone pain -complete hair loss including hair on your head, underarms, pubic hair, eyebrows, and eyelashes -changes in the color of fingernails -diarrhea -loosening of the fingernails -loss of appetite -muscle or joint pain -red flush to skin -sweating This list may not describe all possible side effects. Call your doctor for medical advice about   side effects. You may report side effects to FDA at 1-800-FDA-1088. Where should I keep my medicine? This drug is given in a hospital or clinic and will not be stored at  home. NOTE: This sheet is a summary. It may not cover all possible information. If you have questions about this medicine, talk to your doctor, pharmacist, or health care provider.  2018 Elsevier/Gold Standard (2015-02-09 19:58:00)  Carboplatin injection What is this medicine? CARBOPLATIN (KAR boe pla tin) is a chemotherapy drug. It targets fast dividing cells, like cancer cells, and causes these cells to die. This medicine is used to treat ovarian cancer and many other cancers. This medicine may be used for other purposes; ask your health care provider or pharmacist if you have questions. COMMON BRAND NAME(S): Paraplatin What should I tell my health care provider before I take this medicine? They need to know if you have any of these conditions: -blood disorders -hearing problems -kidney disease -recent or ongoing radiation therapy -an unusual or allergic reaction to carboplatin, cisplatin, other chemotherapy, other medicines, foods, dyes, or preservatives -pregnant or trying to get pregnant -breast-feeding How should I use this medicine? This drug is usually given as an infusion into a vein. It is administered in a hospital or clinic by a specially trained health care professional. Talk to your pediatrician regarding the use of this medicine in children. Special care may be needed. Overdosage: If you think you have taken too much of this medicine contact a poison control center or emergency room at once. NOTE: This medicine is only for you. Do not share this medicine with others. What if I miss a dose? It is important not to miss a dose. Call your doctor or health care professional if you are unable to keep an appointment. What may interact with this medicine? -medicines for seizures -medicines to increase blood counts like filgrastim, pegfilgrastim, sargramostim -some antibiotics like amikacin, gentamicin, neomycin, streptomycin, tobramycin -vaccines Talk to your doctor or health  care professional before taking any of these medicines: -acetaminophen -aspirin -ibuprofen -ketoprofen -naproxen This list may not describe all possible interactions. Give your health care provider a list of all the medicines, herbs, non-prescription drugs, or dietary supplements you use. Also tell them if you smoke, drink alcohol, or use illegal drugs. Some items may interact with your medicine. What should I watch for while using this medicine? Your condition will be monitored carefully while you are receiving this medicine. You will need important blood work done while you are taking this medicine. This drug may make you feel generally unwell. This is not uncommon, as chemotherapy can affect healthy cells as well as cancer cells. Report any side effects. Continue your course of treatment even though you feel ill unless your doctor tells you to stop. In some cases, you may be given additional medicines to help with side effects. Follow all directions for their use. Call your doctor or health care professional for advice if you get a fever, chills or sore throat, or other symptoms of a cold or flu. Do not treat yourself. This drug decreases your body's ability to fight infections. Try to avoid being around people who are sick. This medicine may increase your risk to bruise or bleed. Call your doctor or health care professional if you notice any unusual bleeding. Be careful brushing and flossing your teeth or using a toothpick because you may get an infection or bleed more easily. If you have any dental work done, tell your dentist you  are receiving this medicine. Avoid taking products that contain aspirin, acetaminophen, ibuprofen, naproxen, or ketoprofen unless instructed by your doctor. These medicines may hide a fever. Do not become pregnant while taking this medicine. Women should inform their doctor if they wish to become pregnant or think they might be pregnant. There is a potential for serious  side effects to an unborn child. Talk to your health care professional or pharmacist for more information. Do not breast-feed an infant while taking this medicine. What side effects may I notice from receiving this medicine? Side effects that you should report to your doctor or health care professional as soon as possible: -allergic reactions like skin rash, itching or hives, swelling of the face, lips, or tongue -signs of infection - fever or chills, cough, sore throat, pain or difficulty passing urine -signs of decreased platelets or bleeding - bruising, pinpoint red spots on the skin, black, tarry stools, nosebleeds -signs of decreased red blood cells - unusually weak or tired, fainting spells, lightheadedness -breathing problems -changes in hearing -changes in vision -chest pain -high blood pressure -low blood counts - This drug may decrease the number of white blood cells, red blood cells and platelets. You may be at increased risk for infections and bleeding. -nausea and vomiting -pain, swelling, redness or irritation at the injection site -pain, tingling, numbness in the hands or feet -problems with balance, talking, walking -trouble passing urine or change in the amount of urine Side effects that usually do not require medical attention (report to your doctor or health care professional if they continue or are bothersome): -hair loss -loss of appetite -metallic taste in the mouth or changes in taste This list may not describe all possible side effects. Call your doctor for medical advice about side effects. You may report side effects to FDA at 1-800-FDA-1088. Where should I keep my medicine? This drug is given in a hospital or clinic and will not be stored at home. NOTE: This sheet is a summary. It may not cover all possible information. If you have questions about this medicine, talk to your doctor, pharmacist, or health care provider.  2018 Elsevier/Gold Standard (2007-07-16  14:38:05)     

## 2017-09-07 ENCOUNTER — Inpatient Hospital Stay: Payer: Medicare HMO

## 2017-09-07 ENCOUNTER — Telehealth: Payer: Self-pay

## 2017-09-07 VITALS — BP 128/76 | HR 63 | Temp 98.0°F | Resp 18

## 2017-09-07 DIAGNOSIS — C786 Secondary malignant neoplasm of retroperitoneum and peritoneum: Secondary | ICD-10-CM | POA: Diagnosis not present

## 2017-09-07 DIAGNOSIS — R590 Localized enlarged lymph nodes: Secondary | ICD-10-CM

## 2017-09-07 DIAGNOSIS — Z5111 Encounter for antineoplastic chemotherapy: Secondary | ICD-10-CM | POA: Diagnosis not present

## 2017-09-07 DIAGNOSIS — R110 Nausea: Secondary | ICD-10-CM | POA: Diagnosis not present

## 2017-09-07 DIAGNOSIS — G893 Neoplasm related pain (acute) (chronic): Secondary | ICD-10-CM | POA: Diagnosis not present

## 2017-09-07 DIAGNOSIS — M8580 Other specified disorders of bone density and structure, unspecified site: Secondary | ICD-10-CM | POA: Diagnosis not present

## 2017-09-07 DIAGNOSIS — M7989 Other specified soft tissue disorders: Secondary | ICD-10-CM | POA: Diagnosis not present

## 2017-09-07 DIAGNOSIS — C561 Malignant neoplasm of right ovary: Secondary | ICD-10-CM

## 2017-09-07 DIAGNOSIS — Z5189 Encounter for other specified aftercare: Secondary | ICD-10-CM | POA: Diagnosis not present

## 2017-09-07 DIAGNOSIS — Z90710 Acquired absence of both cervix and uterus: Secondary | ICD-10-CM | POA: Diagnosis not present

## 2017-09-07 DIAGNOSIS — E119 Type 2 diabetes mellitus without complications: Secondary | ICD-10-CM | POA: Diagnosis not present

## 2017-09-07 MED ORDER — PEGFILGRASTIM-CBQV 6 MG/0.6ML ~~LOC~~ SOSY
6.0000 mg | PREFILLED_SYRINGE | Freq: Once | SUBCUTANEOUS | Status: AC
  Administered 2017-09-07: 6 mg via SUBCUTANEOUS

## 2017-09-07 NOTE — Telephone Encounter (Addendum)
First Chemo Follow up:  Outgoing call to patient.  She reports she has been using Compazine as needed for nausea. She received her Neulasta injection this morning and feels sore.  Reminded her of taking Claritin and Tylenol and she verbalized she is doing that.  Encouraged pt to drink plenty of fluids and eat well, even if small meals, snacks at least every few hours, not to let her stomach get completely empty as it might make her feel sicker.  Pt voiced understanding.  She has upcoming appt with Dr Gerarda Fraction on 6/3 and will contact us before then if needed. No other needs per pt at this time.

## 2017-09-07 NOTE — Patient Instructions (Signed)
Pegfilgrastim injection What is this medicine? PEGFILGRASTIM (PEG fil gra stim) is a long-acting granulocyte colony-stimulating factor that stimulates the growth of neutrophils, a type of white blood cell important in the body's fight against infection. It is used to reduce the incidence of fever and infection in patients with certain types of cancer who are receiving chemotherapy that affects the bone marrow, and to increase survival after being exposed to high doses of radiation. This medicine may be used for other purposes; ask your health care provider or pharmacist if you have questions. COMMON BRAND NAME(S): Neulasta What should I tell my health care provider before I take this medicine? They need to know if you have any of these conditions: -kidney disease -latex allergy -ongoing radiation therapy -sickle cell disease -skin reactions to acrylic adhesives (On-Body Injector only) -an unusual or allergic reaction to pegfilgrastim, filgrastim, other medicines, foods, dyes, or preservatives -pregnant or trying to get pregnant -breast-feeding How should I use this medicine? This medicine is for injection under the skin. If you get this medicine at home, you will be taught how to prepare and give the pre-filled syringe or how to use the On-body Injector. Refer to the patient Instructions for Use for detailed instructions. Use exactly as directed. Tell your healthcare provider immediately if you suspect that the On-body Injector may not have performed as intended or if you suspect the use of the On-body Injector resulted in a missed or partial dose. It is important that you put your used needles and syringes in a special sharps container. Do not put them in a trash can. If you do not have a sharps container, call your pharmacist or healthcare provider to get one. Talk to your pediatrician regarding the use of this medicine in children. While this drug may be prescribed for selected conditions,  precautions do apply. Overdosage: If you think you have taken too much of this medicine contact a poison control center or emergency room at once. NOTE: This medicine is only for you. Do not share this medicine with others. What if I miss a dose? It is important not to miss your dose. Call your doctor or health care professional if you miss your dose. If you miss a dose due to an On-body Injector failure or leakage, a new dose should be administered as soon as possible using a single prefilled syringe for manual use. What may interact with this medicine? Interactions have not been studied. Give your health care provider a list of all the medicines, herbs, non-prescription drugs, or dietary supplements you use. Also tell them if you smoke, drink alcohol, or use illegal drugs. Some items may interact with your medicine. This list may not describe all possible interactions. Give your health care provider a list of all the medicines, herbs, non-prescription drugs, or dietary supplements you use. Also tell them if you smoke, drink alcohol, or use illegal drugs. Some items may interact with your medicine. What should I watch for while using this medicine? You may need blood work done while you are taking this medicine. If you are going to need a MRI, CT scan, or other procedure, tell your doctor that you are using this medicine (On-Body Injector only). What side effects may I notice from receiving this medicine? Side effects that you should report to your doctor or health care professional as soon as possible: -allergic reactions like skin rash, itching or hives, swelling of the face, lips, or tongue -dizziness -fever -pain, redness, or irritation at site   where injected -pinpoint red spots on the skin -red or dark-brown urine -shortness of breath or breathing problems -stomach or side pain, or pain at the shoulder -swelling -tiredness -trouble passing urine or change in the amount of urine Side  effects that usually do not require medical attention (report to your doctor or health care professional if they continue or are bothersome): -bone pain -muscle pain This list may not describe all possible side effects. Call your doctor for medical advice about side effects. You may report side effects to FDA at 1-800-FDA-1088. Where should I keep my medicine? Keep out of the reach of children. Store pre-filled syringes in a refrigerator between 2 and 8 degrees C (36 and 46 degrees F). Do not freeze. Keep in carton to protect from light. Throw away this medicine if it is left out of the refrigerator for more than 48 hours. Throw away any unused medicine after the expiration date. NOTE: This sheet is a summary. It may not cover all possible information. If you have questions about this medicine, talk to your doctor, pharmacist, or health care provider.  2018 Elsevier/Gold Standard (2016-04-06 12:58:03)  

## 2017-09-10 DIAGNOSIS — J302 Other seasonal allergic rhinitis: Secondary | ICD-10-CM | POA: Diagnosis not present

## 2017-09-10 DIAGNOSIS — K219 Gastro-esophageal reflux disease without esophagitis: Secondary | ICD-10-CM | POA: Diagnosis not present

## 2017-09-10 DIAGNOSIS — R112 Nausea with vomiting, unspecified: Secondary | ICD-10-CM | POA: Diagnosis not present

## 2017-09-10 DIAGNOSIS — E11620 Type 2 diabetes mellitus with diabetic dermatitis: Secondary | ICD-10-CM | POA: Diagnosis not present

## 2017-09-10 DIAGNOSIS — C7989 Secondary malignant neoplasm of other specified sites: Secondary | ICD-10-CM | POA: Diagnosis not present

## 2017-09-10 DIAGNOSIS — R69 Illness, unspecified: Secondary | ICD-10-CM | POA: Diagnosis not present

## 2017-09-10 DIAGNOSIS — C569 Malignant neoplasm of unspecified ovary: Secondary | ICD-10-CM | POA: Diagnosis not present

## 2017-09-10 DIAGNOSIS — Z7984 Long term (current) use of oral hypoglycemic drugs: Secondary | ICD-10-CM | POA: Diagnosis not present

## 2017-09-10 DIAGNOSIS — I10 Essential (primary) hypertension: Secondary | ICD-10-CM | POA: Diagnosis not present

## 2017-09-10 DIAGNOSIS — R52 Pain, unspecified: Secondary | ICD-10-CM | POA: Diagnosis not present

## 2017-09-24 ENCOUNTER — Encounter: Payer: Self-pay | Admitting: Obstetrics

## 2017-09-24 ENCOUNTER — Inpatient Hospital Stay: Payer: Medicare HMO | Attending: Obstetrics | Admitting: Obstetrics

## 2017-09-24 VITALS — BP 130/77 | HR 80 | Temp 98.0°F | Resp 20 | Ht 65.0 in | Wt 163.5 lb

## 2017-09-24 DIAGNOSIS — Z5111 Encounter for antineoplastic chemotherapy: Secondary | ICD-10-CM | POA: Insufficient documentation

## 2017-09-24 DIAGNOSIS — K219 Gastro-esophageal reflux disease without esophagitis: Secondary | ICD-10-CM | POA: Diagnosis not present

## 2017-09-24 DIAGNOSIS — E119 Type 2 diabetes mellitus without complications: Secondary | ICD-10-CM | POA: Insufficient documentation

## 2017-09-24 DIAGNOSIS — R188 Other ascites: Secondary | ICD-10-CM | POA: Insufficient documentation

## 2017-09-24 DIAGNOSIS — G620 Drug-induced polyneuropathy: Secondary | ICD-10-CM | POA: Diagnosis not present

## 2017-09-24 DIAGNOSIS — R600 Localized edema: Secondary | ICD-10-CM | POA: Insufficient documentation

## 2017-09-24 DIAGNOSIS — C561 Malignant neoplasm of right ovary: Secondary | ICD-10-CM | POA: Insufficient documentation

## 2017-09-24 DIAGNOSIS — Z90710 Acquired absence of both cervix and uterus: Secondary | ICD-10-CM | POA: Diagnosis not present

## 2017-09-24 NOTE — Patient Instructions (Signed)
1. Return in July for review of your CT and determination for surgery 2. Continue chemo per Dr. Alvy Bimler

## 2017-09-24 NOTE — Progress Notes (Signed)
Progress Note: Gyn-Onc Followup  Originally consult was requested by Dr. Evie Lacks   CC:  Chief Complaint  Patient presents with  . 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  is a very nice 67 y.o.  P2   Interval History: Since her last visit she initiate her neoadjuvant chemotherapy regimen on 09/05/2017.  On 08/31/2017 a Ca1 25 was repeated and noted to be increased from prior now 819.  She states that cycle 1 was tolerated fairly well.  She did have some fatigue initially for the first 4 days but has since felt well.  She has noted that her ascites/bloating has started to increase again along with some lower extremity edema which she has discussed on previous visits with Dr. Alvy Bimler.  Cycle 2 planned for 09/26/17.  She is starting to lose her hair and has a plan this week to have her stylist shave.  She asked me today about working out in the sun while on chemo.   Presenting History:  She presented to her gynecologist for her annual exam and noted symptoms of bloating along with decreased appetite however increased weight.  On physical exam her gynecologist noted a palpable mass and imaging was ordered including ultrasound.  Ultrasound revealed a complex cystic mass in the right adnexa measuring 20 x 11 x 12 cm with multiple internal septations some of which are thick.  The left adnexa measured 12.7 x 11.6 x 8.1 with low level echoes and soft tissue nodules.  This was 07/30/2017.  The same day she had a Ca125 equal to 521.3.  At some point a provider ordered an MRI of the abdomen and pelvis with and without contrast the lower chest revealed several prominent lower anterior mediastinal lymph nodes including a pericardial measuring 12 mm in short axis.  Hepatic steatosis.  Surface of the liver is noted to have several soft tissue areas of thickening and enhancement suspicious for serosal implants.  Peritoneal disease is expected in the upper abdomen adjacent to the liver and  transverse colon.  Extensive omental and peritoneal implants are appreciated in the left lower quadrant.  She does have an abdominal MRI in 2018 for pancreatic cyst no abdominal pelvic mass or peritoneal disease was noted at that time.  Looking back she thinks that over the 6 months prior to presentation she noted she had just not been feeling her usual self with decreased energy and occasional discomfort in the abdomen.  Again she notes a weight gain despite a decreased appetite.  She describes her pain as being vaginal and abdominal 5 out of 10 sharp to dull it is brief but aggravated by sitting she alleviates it with anti-inflammatories.  She does also note bloating increased symptoms of reflux and urinary urgency.  Her bowel movements do not seem to be affected she is having daily bowel movements and denies persistent nausea and vomiting.  She has had some nausea which occurs about twice per week.  On a side note she was a widow and recently married a new gentleman.  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 There is mucinous differentiation. In addition a PET was performed to look at the mediastinal/cardiophrenic lymph node.  Unfortunately that did show FDG uptake and appears to be consistent with metastatic disease which would make her unresectable in the upfront setting.  Measurement of disease:  Recent Labs    08/31/17 1118  CAN125 819.9*    . 07/30/2017-Ca125-  521.3 performed at [place] . 08/31/17 = 819.9 (C1) . 09/26/17 = _____ (C2)   CEA and CA19-9 ordered 08/22/17 given the "mucinous" differentiation in the biopsy report -both normal   Radiology:   08/02/2017-MRI of the abdomen and pelvis with and without contrast -performed in [place], [place] -  the lower chest revealed several prominent lower anterior mediastinal lymph nodes including a pericardial measuring 12 mm in short axis.  Hepatic steatosis.  Surface of the liver is noted to have  several soft tissue areas of thickening and enhancement suspicious for serosal implants.  Peritoneal disease is expected in the upper abdomen adjacent to the liver and transverse colon.  Extensive omental and peritoneal implants are appreciated in the left lower quadrant.  08/20/2017 chest CT-[place]-as noted in the oncologic history  08/20/2017-PET scan-[place]-as noted in the oncologic history     Oncologic History:      Right ovarian epithelial cancer ([place])   07/30/2017 Imaging    US pelvis Ultrasound revealed a complex cystic mass in the right adnexa measuring 20 x 11 x 12 cm with multiple internal septations some of which are thick. The left adnexa measured 12.7 x 11.6 x 8.1 with low level echoes and soft tissue nodules       07/30/2017 Tumor Marker    Patient's tumor was tested for the following markers: CA-125 Results of the tumor marker test revealed 521.3      08/17/2017 Pathology Results    The malignant cells are positive for PAX-8, cytokeratin 7, estrogen receptor, and faintly positive for GATA-3. They are negative for p53, GCDFP, and cytokeratin 20. The finding are consistent with a gynecologic primary carcinoma. Additional studies can be performed upon clinician request.      08/17/2017 Procedure    Technically successful CT-guided left lower quadrant omental mass core biopsy.      08/20/2017 PET scan    1. Cystic masses arising from the pelvis. The nodular component of the RIGHT cystic mass is intensely hypermetabolic consistent with malignant ovarian neoplasm. 2. Extensive hypermetabolic peritoneal thickening in the lower abdomen and upper pelvis, upper abdomen, and upper abdominal precordial fat and paradiaphragmatic fat. 3. Retroperitoneal nodal metastasis adjacent to the IVC at the level the kidneys. 4. No evidence of metastatic disease in the thorax other small effusion on the LEFT and nodal metastasis in the fat superior to the diaphragm. 5. Mild metabolic  activity associated the distal esophagus is favored benign esophagitis.      08/20/2017 Imaging    CT chest 1. Bilateral cardiophrenic angle nodal metastasis. No additional findings to suggest metastatic disease to the chest. 2. Small left pleural effusion. 3. Peritoneal carcinomatosis noted within the abdomen. 4. Hepatic steatosis.       08/24/2017 Cancer Staging    Staging form: Ovary, Fallopian Tube, and Primary Peritoneal Carcinoma, AJCC 8th Edition - Clinical: Stage IV (cT3, cN1, cM1) - Signed by [name], MD on 08/24/2017      08/31/2017 Tumor Marker    Patient's tumor was tested for the following markers: CA-125 Results of the tumor marker test revealed 819.9       Current Meds:  Outpatient Encounter Medications as of 09/24/2017  Medication Sig  . acetaminophen (TYLENOL) 500 MG tablet Take 500 mg by mouth every 8 (eight) hours as needed for mild pain or moderate pain.  Marland Kitchen atenolol (TENORMIN) 25 MG tablet Take 25 mg by mouth at bedtime.   Marland Kitchen CALCIUM-MAGNESIUM-VITAMIN D PO Take 1 tablet by  mouth daily.  Marland Kitchen dexamethasone (DECADRON) 4 MG tablet Take 3 tabs the night before chemo and 3 tabs in the morning of chemotherapy, every 3 weeks  . lidocaine-prilocaine (EMLA) cream Apply 1 application topically as needed.  . loratadine (CLARITIN) 10 MG tablet Take 10 mg by mouth daily as needed for allergies.  . metFORMIN (GLUCOPHAGE) 1000 MG tablet Take 500 mg by mouth 2 (two) times daily.  . Multiple Vitamin (MULTIVITAMIN WITH MINERALS) TABS tablet Take 1 tablet by mouth daily.  . ondansetron (ZOFRAN) 8 MG tablet Take 1 tablet (8 mg total) by mouth every 8 (eight) hours as needed for nausea.  . prochlorperazine (COMPAZINE) 10 MG tablet Take 1 tablet (10 mg total) by mouth every 6 (six) hours as needed for nausea or vomiting.   No facility-administered encounter medications on file as of 09/24/2017.     Allergy: No Known Allergies  Social Hx:   Social History   Socioeconomic History  .  Marital status: Married    Spouse name: Rush Landmark  . Number of children: 2  . Years of education: Not on file  . Highest education level: Not on file  Occupational History  . Occupation: retired Microbiologist  . Financial resource strain: Not on file  . Food insecurity:    Worry: Not on file    Inability: Not on file  . Transportation needs:    Medical: Not on file    Non-medical: Not on file  Tobacco Use  . Smoking status: Never Smoker  . Smokeless tobacco: Never Used  Substance and Sexual Activity  . Alcohol use: Yes    Alcohol/week: 2.5 oz    Types: 5 Standard drinks or equivalent per week    Comment: occ  . Drug use: No  . Sexual activity: Yes    Birth control/protection: None  Lifestyle  . Physical activity:    Days per week: Not on file    Minutes per session: Not on file  . Stress: Not on file  Relationships  . Social connections:    Talks on phone: Not on file    Gets together: Not on file    Attends religious service: Not on file    Active member of club or organization: Not on file    Attends meetings of clubs or organizations: Not on file    Relationship status: Not on file  . Intimate partner violence:    Fear of current or ex partner: Not on file    Emotionally abused: Not on file    Physically abused: Not on file    Forced sexual activity: Not on file  Other Topics Concern  . Not on file  Social History Narrative  . Not on file    Past Surgical Hx:  Past Surgical History:  Procedure Laterality Date  . CHOLECYSTECTOMY N/A 07/06/2014   Procedure: LAPAROSCOPIC CHOLECYSTECTOMY;  Surgeon: Aviva Signs Md, MD;  Location: [facility];  Service: General;  Laterality: N/A;  . COLONOSCOPY N/A 06/26/2013   Procedure: COLONOSCOPY;  Surgeon: Rogene Houston, MD;  Location: [facility] END[facility] SUITE;  Service: Endoscopy;  Laterality: N/A;  1030  . IR FLUORO GUIDE PORT INSERTION RIGHT  09/03/2017  . IR US GUIDE VASC ACCESS RIGHT  09/03/2017  . VAGINAL HYSTERECTOMY   2011    Past Medical Hx:  Past Medical History:  Diagnosis Date  . Diabetes mellitus without complication ([place])   . Diverticulosis   . Hypertension   . Osteopenia   .  Ovarian cancer ([place])   . Pancreatic cyst     Past Gynecological History:   GYNECOLOGIC HISTORY:  No LMP recorded. Patient has had a hysterectomy. Menarche: 67 years old P 68 LMP 67 years old Contraceptive; yes, <10 years HRT <1 year  Last Pap s/p vaginal hysterectomy 2011 for "prolapse"  Family Hx:  Family History  Problem Relation Age of Onset  . Colon cancer Mother 34       colon ca  . Skin cancer Father 62       unsure type  . Cancer Maternal Grandmother 101       unknown cancer  . Brain cancer Maternal Grandfather 70    Review of Systems:  Review of Systems  Cardiovascular: Positive for leg swelling.  Gastrointestinal: Positive for abdominal distention.  All other systems reviewed and are negative. + hair loss    Vitals:  Blood pressure 130/77, pulse 80, temperature 98 F (36.7 C), temperature source Oral, resp. rate 20, height 5' 5"  (1.651 m), weight 163 lb 8 oz (74.2 kg), SpO2 99 %. Body mass index is 27.21 kg/m.   Physical Exam: ECOG PERFORMANCE STATUS: 1 - Symptomatic but completely ambulatory  General :  Well developed, 67 y.o., female in no apparent distress HEENT:  Normocephalic/atraumatic, symmetric, EOMI, eyelids normal Neck:   No visible masses. No palpable adenopathy Respiratory:  Respirations unlabored, no use of accessory muscles CV:   Deferred Breast:  Deferred Musculoskeletal: Normal muscle strength.   Abdomen:  Visible fullness and distention.  Nontender; no palpable masses Extremities:  No erythema , no pain , left lower extremity has 1+ pitting edema. Skin:   Normal inspection.  No rash Neuro/Psych:  No focal motor deficit, no abnormal mental status. Normal gait. Normal affect. Alert and oriented to person, place, and time  Genito Urinary from visit 08/03/17: Vulva:  Normal external female genitalia.  Bladder/urethra: Urethral meatus normal in size and location. No lesions or masses, well supported bladder Speculum exam: Vagina: No lesion, no discharge, no bleeding. Cervix: Surgically absent Bimanual exam: There is a surgically absent there is palpable fullness but no specific nodularity Uterus: Absent Rectovaginal:  Good tone, fullness in the cul-de-sac but no cul de sac nodularity, no parametrial involvement or nodularity.  The rectal wall feels smooth but there is some mild compression anteriorly from the mass in the pelvis which is mildly mobile.  This compression starts at approximately 7-8 cm in from the anal verge.  Oncologic Summary: 1. Ovarian adenocarcinoma  2. Cardiophrenic lymphadenopathy    Assessment/Plan: 1. She has tolerated her first cycle of treatment well and will continue this with Dr. Alvy Bimler 2 days from now for cycle 2 2. We will see how her Ca1 25 responds 3. Plan will be for imaging after cycle 3 and return to review along with repeat pelvic at that time 4. Tentative date of November 13, 2017 for surgery 5. We briefly discussed the extra-abdominal disease which may limit her ability for optimal debulking.  Dr. Alvy Bimler and I will discuss the nuances of timing of surgery after we see better her response and the imaging.  This was reviewed with the patient and she expressed an understanding 6. She was accompanied today by her new husband. 7. Defer imaging follow-up for lower extremity to Dr. Alvy Bimler.  It would seem to me less likely to be a thrombus since there is intermittent improvement no other symptoms than swelling. 8. I told the patient when she does go out in the  sun to be sure to wear hat and sunscreen.    Isabel Caprice, MD  09/24/2017, 10:38 AM  Cc: Sinda Du, Linna Hoff, [place] (PCP) Gari Crown, MD (Referring OB/Gyn)

## 2017-09-26 ENCOUNTER — Inpatient Hospital Stay: Payer: Medicare HMO

## 2017-09-26 ENCOUNTER — Other Ambulatory Visit: Payer: Self-pay | Admitting: *Deleted

## 2017-09-26 ENCOUNTER — Encounter: Payer: Self-pay | Admitting: Hematology and Oncology

## 2017-09-26 ENCOUNTER — Inpatient Hospital Stay: Payer: Medicare HMO | Admitting: Medical

## 2017-09-26 ENCOUNTER — Telehealth: Payer: Self-pay | Admitting: Hematology and Oncology

## 2017-09-26 ENCOUNTER — Inpatient Hospital Stay (HOSPITAL_BASED_OUTPATIENT_CLINIC_OR_DEPARTMENT_OTHER): Payer: Medicare HMO | Admitting: Hematology and Oncology

## 2017-09-26 VITALS — BP 126/75 | HR 87 | Resp 18

## 2017-09-26 VITALS — BP 131/88 | HR 98 | Temp 97.9°F | Resp 18 | Ht 65.0 in | Wt 160.9 lb

## 2017-09-26 DIAGNOSIS — M7989 Other specified soft tissue disorders: Secondary | ICD-10-CM

## 2017-09-26 DIAGNOSIS — C561 Malignant neoplasm of right ovary: Secondary | ICD-10-CM | POA: Diagnosis not present

## 2017-09-26 DIAGNOSIS — T8090XA Unspecified complication following infusion and therapeutic injection, initial encounter: Secondary | ICD-10-CM

## 2017-09-26 DIAGNOSIS — E119 Type 2 diabetes mellitus without complications: Secondary | ICD-10-CM

## 2017-09-26 DIAGNOSIS — R590 Localized enlarged lymph nodes: Secondary | ICD-10-CM

## 2017-09-26 DIAGNOSIS — Z5111 Encounter for antineoplastic chemotherapy: Secondary | ICD-10-CM | POA: Diagnosis not present

## 2017-09-26 LAB — CMP (CANCER CENTER ONLY)
ALT: 13 U/L (ref 0–55)
ANION GAP: 10 (ref 3–11)
AST: 21 U/L (ref 5–34)
Albumin: 3.9 g/dL (ref 3.5–5.0)
Alkaline Phosphatase: 71 U/L (ref 40–150)
BUN: 13 mg/dL (ref 7–26)
CHLORIDE: 103 mmol/L (ref 98–109)
CO2: 25 mmol/L (ref 22–29)
CREATININE: 0.89 mg/dL (ref 0.60–1.10)
Calcium: 9.5 mg/dL (ref 8.4–10.4)
Glucose, Bld: 233 mg/dL — ABNORMAL HIGH (ref 70–140)
POTASSIUM: 4.1 mmol/L (ref 3.5–5.1)
SODIUM: 138 mmol/L (ref 136–145)
Total Bilirubin: 0.3 mg/dL (ref 0.2–1.2)
Total Protein: 7.8 g/dL (ref 6.4–8.3)

## 2017-09-26 LAB — CBC WITH DIFFERENTIAL (CANCER CENTER ONLY)
Basophils Absolute: 0 10*3/uL (ref 0.0–0.1)
Basophils Relative: 1 %
EOS ABS: 0 10*3/uL (ref 0.0–0.5)
Eosinophils Relative: 0 %
HEMATOCRIT: 37.8 % (ref 34.8–46.6)
HEMOGLOBIN: 12.2 g/dL (ref 11.6–15.9)
LYMPHS ABS: 0.9 10*3/uL (ref 0.9–3.3)
LYMPHS PCT: 14 %
MCH: 28.6 pg (ref 25.1–34.0)
MCHC: 32.2 g/dL (ref 31.5–36.0)
MCV: 88.6 fL (ref 79.5–101.0)
MONOS PCT: 0 %
Monocytes Absolute: 0 10*3/uL — ABNORMAL LOW (ref 0.1–0.9)
NEUTROS ABS: 5.4 10*3/uL (ref 1.5–6.5)
NEUTROS PCT: 85 %
Platelet Count: 341 10*3/uL (ref 145–400)
RBC: 4.26 MIL/uL (ref 3.70–5.45)
RDW: 14.4 % (ref 11.2–14.5)
WBC: 6.4 10*3/uL (ref 3.9–10.3)

## 2017-09-26 MED ORDER — HEPARIN SOD (PORK) LOCK FLUSH 100 UNIT/ML IV SOLN
500.0000 [IU] | Freq: Once | INTRAVENOUS | Status: AC | PRN
  Administered 2017-09-26: 500 [IU]
  Filled 2017-09-26: qty 5

## 2017-09-26 MED ORDER — SODIUM CHLORIDE 0.9% FLUSH
10.0000 mL | INTRAVENOUS | Status: DC | PRN
  Administered 2017-09-26: 10 mL
  Filled 2017-09-26: qty 10

## 2017-09-26 MED ORDER — PACLITAXEL CHEMO INJECTION 300 MG/50ML
175.0000 mg/m2 | Freq: Once | INTRAVENOUS | Status: AC
  Administered 2017-09-26: 324 mg via INTRAVENOUS
  Filled 2017-09-26: qty 54

## 2017-09-26 MED ORDER — FAMOTIDINE IN NACL 20-0.9 MG/50ML-% IV SOLN
20.0000 mg | Freq: Once | INTRAVENOUS | Status: AC
  Administered 2017-09-26: 20 mg via INTRAVENOUS

## 2017-09-26 MED ORDER — DIPHENHYDRAMINE HCL 50 MG/ML IJ SOLN
50.0000 mg | Freq: Once | INTRAMUSCULAR | Status: AC
  Administered 2017-09-26: 50 mg via INTRAVENOUS

## 2017-09-26 MED ORDER — METHYLPREDNISOLONE SODIUM SUCC 125 MG IJ SOLR
125.0000 mg | Freq: Once | INTRAMUSCULAR | Status: AC | PRN
  Administered 2017-09-26: 125 mg via INTRAVENOUS

## 2017-09-26 MED ORDER — SODIUM CHLORIDE 0.9 % IV SOLN
Freq: Once | INTRAVENOUS | Status: AC
  Administered 2017-09-26: 11:00:00 via INTRAVENOUS
  Filled 2017-09-26: qty 5

## 2017-09-26 MED ORDER — LORAZEPAM 2 MG/ML IJ SOLN
0.5000 mg | Freq: Once | INTRAMUSCULAR | Status: AC
  Administered 2017-09-26: 0.5 mg via INTRAVENOUS

## 2017-09-26 MED ORDER — PALONOSETRON HCL INJECTION 0.25 MG/5ML
0.2500 mg | Freq: Once | INTRAVENOUS | Status: AC
  Administered 2017-09-26: 0.25 mg via INTRAVENOUS

## 2017-09-26 MED ORDER — DIPHENHYDRAMINE HCL 50 MG/ML IJ SOLN
INTRAMUSCULAR | Status: AC
Start: 2017-09-26 — End: ?
  Filled 2017-09-26: qty 1

## 2017-09-26 MED ORDER — LORAZEPAM 2 MG/ML IJ SOLN
INTRAMUSCULAR | Status: AC
  Filled 2017-09-26: qty 1

## 2017-09-26 MED ORDER — SODIUM CHLORIDE 0.9 % IV SOLN
Freq: Once | INTRAVENOUS | Status: AC
  Administered 2017-09-26: 10:00:00 via INTRAVENOUS

## 2017-09-26 MED ORDER — FAMOTIDINE IN NACL 20-0.9 MG/50ML-% IV SOLN
INTRAVENOUS | Status: AC
  Filled 2017-09-26: qty 50

## 2017-09-26 MED ORDER — SODIUM CHLORIDE 0.9 % IV SOLN
540.0000 mg | Freq: Once | INTRAVENOUS | Status: AC
  Administered 2017-09-26: 540 mg via INTRAVENOUS
  Filled 2017-09-26: qty 54

## 2017-09-26 MED ORDER — SODIUM CHLORIDE 0.9% FLUSH
10.0000 mL | Freq: Once | INTRAVENOUS | Status: AC
  Administered 2017-09-26: 10 mL
  Filled 2017-09-26: qty 10

## 2017-09-26 MED ORDER — LORAZEPAM 2 MG/ML IJ SOLN: 0.5000 mg | Freq: Once | INTRAMUSCULAR | Status: DC

## 2017-09-26 MED ORDER — PALONOSETRON HCL INJECTION 0.25 MG/5ML
INTRAVENOUS | Status: AC
  Filled 2017-09-26: qty 5

## 2017-09-26 NOTE — Progress Notes (Signed)
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[facility]) The patient tolerated cycle 1 of treatment well She had mild G-CSF induced bone pain and nausea that resolved with conservative management I will proceed with cycle 2 of treatment without dose adjustment I plan to repeat CT imaging after cycle 3 of therapy I will order tumor marker monitoring once a month If she have excellent response to treatment, she will proceed with surgery in July  Type 2 diabetes mellitus treated without insulin ([place]) She  is at risk of worsening diabetes control during treatment We discussed dietary modification and close monitoring of blood sugar  Left leg swelling The left leg swelling is related to compression of lymph node drainage from her tumor I reassured the patient We will observe only She has no signs or symptoms to suggest DVT   Orders Placed This Encounter  Procedures  . CT ABDOMEN PELVIS W CONTRAST    Standing Status:   Future    Standing Expiration Date:   09/27/2018    Order Specific Question:   If indicated for the ordered procedure, I authorize the administration of contrast media per Radiology protocol    Answer:   Yes    Order Specific Question:   Preferred imaging location?    Answer:   [facility]    Order Specific Question:   Radiology Contrast Protocol - do NOT remove file path    Answer:   [email or web address]  . CA 125    Standing Status:   Standing    Number of Occurrences:   9    Standing Expiration Date:   09/27/2018    INTERVAL HISTORY: Please see below for problem oriented charting. She is seen prior to cycle 2 of chemotherapy With cycle 1, she tolerated treatment well except for bone pain due to G-CSF and mild nausea She had frequent bowel movement She have persistent abdominal bloating She denies  peripheral neuropathy Her blood sugar was under controlled She has lost some weight but she thought it could be related to positive response to treatment  SUMMARY OF ONCOLOGIC HISTORY:   Right ovarian epithelial cancer ([place])   07/30/2017 Imaging    US pelvis Ultrasound revealed a complex cystic mass in the right adnexa measuring 20 x 11 x 12 cm with multiple internal septations some of which are thick. The left adnexa measured 12.7 x 11.6 x 8.1 with low level echoes and soft tissue nodules       07/30/2017 Tumor Marker    Patient's tumor was tested for the following markers: CA-125 Results of the tumor marker test revealed 521.3      08/17/2017 Pathology Results    The malignant cells are positive for PAX-8, cytokeratin 7, estrogen receptor, and faintly positive for GATA-3. They are negative for p53, GCDFP, and cytokeratin 20. The finding are consistent with a gynecologic primary carcinoma. Additional studies can be performed upon clinician request.      08/17/2017 Procedure    Technically successful CT-guided left lower quadrant omental mass core biopsy.      08/20/2017 PET scan    1. Cystic masses arising from the pelvis. The nodular component of the RIGHT cystic mass is intensely hypermetabolic consistent with malignant ovarian neoplasm. 2. Extensive hypermetabolic peritoneal thickening in the lower abdomen and upper pelvis, upper abdomen, and upper abdominal precordial fat and paradiaphragmatic fat. 3. Retroperitoneal  nodal metastasis adjacent to the IVC at the level the kidneys. 4. No evidence of metastatic disease in the thorax other small effusion on the LEFT and nodal metastasis in the fat superior to the diaphragm. 5. Mild metabolic activity associated the distal esophagus is favored benign esophagitis.      08/20/2017 Imaging    CT chest 1. Bilateral cardiophrenic angle nodal metastasis. No additional findings to suggest metastatic disease to the chest. 2. Small left pleural  effusion. 3. Peritoneal carcinomatosis noted within the abdomen. 4. Hepatic steatosis.       08/24/2017 Cancer Staging    Staging form: Ovary, Fallopian Tube, and Primary Peritoneal Carcinoma, AJCC 8th Edition - Clinical: Stage IV (cT3, cN1, cM1) - Signed by [name], MD on 08/24/2017      08/31/2017 Tumor Marker    Patient's tumor was tested for the following markers: CA-125 Results of the tumor marker test revealed 819.9       REVIEW OF SYSTEMS:   Constitutional: Denies fevers, chills or abnormal weight loss Eyes: Denies blurriness of vision Ears, nose, mouth, throat, and face: Denies mucositis or sore throat Respiratory: Denies cough, dyspnea or wheezes Cardiovascular: Denies palpitation, chest discomfort  Skin: Denies abnormal skin rashes Lymphatics: Denies new lymphadenopathy or easy bruising Neurological:Denies numbness, tingling or new weaknesses Behavioral/Psych: Mood is stable, no new changes  All other systems were reviewed with the patient and are negative.  I have reviewed the past medical history, past surgical history, social history and family history with the patient and they are unchanged from previous note.  ALLERGIES:  has No Known Allergies.  MEDICATIONS:  Current Outpatient Medications  Medication Sig Dispense Refill  . famotidine (PEPCID) 20 MG tablet Take 20 mg by mouth as needed for heartburn or indigestion.    Marland Kitchen acetaminophen (TYLENOL) 500 MG tablet Take 500 mg by mouth every 8 (eight) hours as needed for mild pain or moderate pain.    Marland Kitchen atenolol (TENORMIN) 25 MG tablet Take 25 mg by mouth at bedtime.     Marland Kitchen CALCIUM-MAGNESIUM-VITAMIN D PO Take 1 tablet by mouth daily.    Marland Kitchen dexamethasone (DECADRON) 4 MG tablet Take 3 tabs the night before chemo and 3 tabs in the morning of chemotherapy, every 3 weeks 36 tablet 0  . lidocaine-prilocaine (EMLA) cream Apply 1 application topically as needed. 30 g 6  . loratadine (CLARITIN) 10 MG tablet Take 10 mg by mouth  daily as needed for allergies.    . metFORMIN (GLUCOPHAGE) 1000 MG tablet Take 500 mg by mouth 2 (two) times daily.  0  . Multiple Vitamin (MULTIVITAMIN WITH MINERALS) TABS tablet Take 1 tablet by mouth daily.    . ondansetron (ZOFRAN) 8 MG tablet Take 1 tablet (8 mg total) by mouth every 8 (eight) hours as needed for nausea. 30 tablet 3  . prochlorperazine (COMPAZINE) 10 MG tablet Take 1 tablet (10 mg total) by mouth every 6 (six) hours as needed for nausea or vomiting. 30 tablet 0   No current facility-administered medications for this visit.    Facility-Administered Medications Ordered in Other Visits  Medication Dose Route Frequency Provider Last Rate Last Dose  . CARBOplatin (PARAPLATIN) 540 mg in sodium chloride 0.9 % 250 mL chemo infusion  540 mg Intravenous Once Alvy Bimler, Shanyn Preisler, MD      . diphenhydrAMINE (BENADRYL) injection 50 mg  50 mg Intravenous Once Alvy Bimler, Chrystle Murillo, MD      . famotidine (PEPCID) IVPB 20 mg premix  20 mg Intravenous  Once [name], MD      . fosaprepitant (EMEND) 150 mg, dexamethasone (DECADRON) 12 mg in sodium chloride 0.9 % 145 mL IVPB   Intravenous Once Alvy Bimler, Johneric Mcfadden, MD      . heparin lock flush 100 unit/mL  500 Units Intracatheter Once PRN Alvy Bimler, Rennee Coyne, MD      . PACLitaxel (TAXOL) 324 mg in sodium chloride 0.9 % 500 mL chemo infusion (> 17m/m2)  175 mg/m2 (Treatment Plan Recorded) Intravenous Once GAlvy Bimler Faithanne Verret, MD      . palonosetron (ALOXI) injection 0.25 mg  0.25 mg Intravenous Once Alberto Pina, MD      . sodium chloride flush (NS) 0.9 % injection 10 mL  10 mL Intracatheter PRN GAlvy Bimler Tykesha Konicki, MD        PHYSICAL EXAMINATION: ECOG PERFORMANCE STATUS: 1 - Symptomatic but completely ambulatory  Vitals:   09/26/17 0928  BP: 131/88  Pulse: 98  Resp: 18  Temp: 97.9 F (36.6 C)  SpO2: 98%   Filed Weights   09/26/17 0928  Weight: 160 lb 14.4 oz (73 kg)    GENERAL:alert, no distress and comfortable SKIN: skin color, texture, turgor are normal, no rashes or  significant lesions EYES: normal, Conjunctiva are pink and non-injected, sclera clear OROPHARYNX:no exudate, no erythema and lips, buccal mucosa, and tongue normal  NECK: supple, thyroid normal size, non-tender, without nodularity LYMPH:  no palpable lymphadenopathy in the cervical, axillary or inguinal LUNGS: clear to auscultation and percussion with normal breathing effort HEART: regular rate & rhythm and no murmurs with persistent left lower extremity edema ABDOMEN:abdomen soft, non-tender and normal bowel sounds.  She has persistent abdominal distention Musculoskeletal:no cyanosis of digits and no clubbing  NEURO: alert & oriented x 3 with fluent speech, no focal motor/sensory deficits  LABORATORY DATA:  I have reviewed the data as listed    Component Value Date/Time   NA 138 09/26/2017 0840   K 4.1 09/26/2017 0840   CL 103 09/26/2017 0840   CO2 25 09/26/2017 0840   GLUCOSE 233 (H) 09/26/2017 0840   BUN 13 09/26/2017 0840   CREATININE 0.89 09/26/2017 0840   CREATININE 0.86 06/05/2014 0957   CALCIUM 9.5 09/26/2017 0840   PROT 7.8 09/26/2017 0840   ALBUMIN 3.9 09/26/2017 0840   AST 21 09/26/2017 0840   ALT 13 09/26/2017 0840   ALKPHOS 71 09/26/2017 0840   BILITOT 0.3 09/26/2017 0840   GFRNONAA >60 09/26/2017 0840   GFRAA >60 09/26/2017 0840    No results found for: SPEP, UPEP  Lab Results  Component Value Date   WBC 6.4 09/26/2017   NEUTROABS 5.4 09/26/2017   HGB 12.2 09/26/2017   HCT 37.8 09/26/2017   MCV 88.6 09/26/2017   PLT 341 09/26/2017      Chemistry      Component Value Date/Time   NA 138 09/26/2017 0840   K 4.1 09/26/2017 0840   CL 103 09/26/2017 0840   CO2 25 09/26/2017 0840   BUN 13 09/26/2017 0840   CREATININE 0.89 09/26/2017 0840   CREATININE 0.86 06/05/2014 0957      Component Value Date/Time   CALCIUM 9.5 09/26/2017 0840   ALKPHOS 71 09/26/2017 0840   AST 21 09/26/2017 0840   ALT 13 09/26/2017 0840   BILITOT 0.3 09/26/2017 0840        RADIOGRAPHIC STUDIES: I have personally reviewed the radiological images as listed and agreed with the findings in the report. Ir [place]Guide Vasc Access Right  Result Date: 09/03/2017  CLINICAL DATA:  Ovarian cancer, access for chemotherapy EXAM: RIGHT INTERNAL JUGULAR SINGLE LUMEN POWER PORT CATHETER INSERTION Date:  09/03/2017 09/03/2017 3:55 pm Radiologist:  Jerilynn Mages. Daryll Brod, MD Guidance:  Ultrasound fluoroscopic MEDICATIONS: Ancef 2 g; The antibiotic was administered within an appropriate time interval prior to skin puncture. ANESTHESIA/SEDATION: Versed 4.0 mg IV; Fentanyl 100 mcg IV; Moderate Sedation Time:  24 minutes The patient was continuously monitored during the procedure by the interventional radiology nurse under my direct supervision. FLUOROSCOPY TIME:  30 seconds (4 mGy) COMPLICATIONS: None immediate. CONTRAST:  None. PROCEDURE: Informed consent was obtained from the patient following explanation of the procedure, risks, benefits and alternatives. The patient understands, agrees and consents for the procedure. All questions were addressed. A time out was performed. Maximal barrier sterile technique utilized including caps, mask, sterile gowns, sterile gloves, large sterile drape, hand hygiene, and 2% chlorhexidine scrub. Under sterile conditions and local anesthesia, right internal jugular micropuncture venous access was performed. Access was performed with ultrasound. Images were obtained for documentation of the patent right internal jugular vein. A guide wire was inserted followed by a transitional dilator. This allowed insertion of a guide wire and catheter into the IVC. Measurements were obtained from the SVC / RA junction back to the right IJ venotomy site. In the right infraclavicular chest, a subcutaneous pocket was created over the second anterior rib. This was done under sterile conditions and local anesthesia. 1% lidocaine with epinephrine was utilized for this. A 2.5 cm incision was  made in the skin. Blunt dissection was performed to create a subcutaneous pocket over the right pectoralis major muscle. The pocket was flushed with saline vigorously. There was adequate hemostasis. The port catheter was assembled and checked for leakage. The port catheter was secured in the pocket with two retention sutures. The tubing was tunneled subcutaneously to the right venotomy site and inserted into the SVC/RA junction through a valved peel-away sheath. Position was confirmed with fluoroscopy. Images were obtained for documentation. The patient tolerated the procedure well. No immediate complications. Incisions were closed in a two layer fashion with 4 - 0 Vicryl suture. Dermabond was applied to the skin. The port catheter was accessed, blood was aspirated followed by saline and heparin flushes. Needle was removed. A dry sterile dressing was applied. IMPRESSION: Ultrasound and fluoroscopically guided right internal jugular single lumen power port catheter insertion. Tip in the SVC/RA junction. Catheter ready for use. Electronically Signed   By: Jerilynn Mages.  Shick M.D.   On: 09/03/2017 16:07   Ir Fluoro Guide Port Insertion Right  Result Date: 09/03/2017 CLINICAL DATA:  Ovarian cancer, access for chemotherapy EXAM: RIGHT INTERNAL JUGULAR SINGLE LUMEN POWER PORT CATHETER INSERTION Date:  09/03/2017 09/03/2017 3:55 pm Radiologist:  Jerilynn Mages. Daryll Brod, MD Guidance:  Ultrasound fluoroscopic MEDICATIONS: Ancef 2 g; The antibiotic was administered within an appropriate time interval prior to skin puncture. ANESTHESIA/SEDATION: Versed 4.0 mg IV; Fentanyl 100 mcg IV; Moderate Sedation Time:  24 minutes The patient was continuously monitored during the procedure by the interventional radiology nurse under my direct supervision. FLUOROSCOPY TIME:  30 seconds (4 mGy) COMPLICATIONS: None immediate. CONTRAST:  None. PROCEDURE: Informed consent was obtained from the patient following explanation of the procedure, risks, benefits  and alternatives. The patient understands, agrees and consents for the procedure. All questions were addressed. A time out was performed. Maximal barrier sterile technique utilized including caps, mask, sterile gowns, sterile gloves, large sterile drape, hand hygiene, and 2% chlorhexidine scrub. Under sterile conditions and local  anesthesia, right internal jugular micropuncture venous access was performed. Access was performed with ultrasound. Images were obtained for documentation of the patent right internal jugular vein. A guide wire was inserted followed by a transitional dilator. This allowed insertion of a guide wire and catheter into the IVC. Measurements were obtained from the SVC / RA junction back to the right IJ venotomy site. In the right infraclavicular chest, a subcutaneous pocket was created over the second anterior rib. This was done under sterile conditions and local anesthesia. 1% lidocaine with epinephrine was utilized for this. A 2.5 cm incision was made in the skin. Blunt dissection was performed to create a subcutaneous pocket over the right pectoralis major muscle. The pocket was flushed with saline vigorously. There was adequate hemostasis. The port catheter was assembled and checked for leakage. The port catheter was secured in the pocket with two retention sutures. The tubing was tunneled subcutaneously to the right venotomy site and inserted into the SVC/RA junction through a valved peel-away sheath. Position was confirmed with fluoroscopy. Images were obtained for documentation. The patient tolerated the procedure well. No immediate complications. Incisions were closed in a two layer fashion with 4 - 0 Vicryl suture. Dermabond was applied to the skin. The port catheter was accessed, blood was aspirated followed by saline and heparin flushes. Needle was removed. A dry sterile dressing was applied. IMPRESSION: Ultrasound and fluoroscopically guided right internal jugular single lumen power  port catheter insertion. Tip in the SVC/RA junction. Catheter ready for use. Electronically Signed   By: Jerilynn Mages.  Shick M.D.   On: 09/03/2017 16:07    All questions were answered. The patient knows to call the clinic with any problems, questions or concerns. No barriers to learning was detected.  I spent 15 minutes counseling the patient face to face. The total time spent in the appointment was 20 minutes and more than 50% was on counseling and review of test results  [name], MD 09/26/2017 10:00 AM

## 2017-09-26 NOTE — Assessment & Plan Note (Signed)
The left leg swelling is related to compression of lymph node drainage from her tumor I reassured the patient We will observe only She has no signs or symptoms to suggest DVT

## 2017-09-26 NOTE — Patient Instructions (Addendum)
[facility] Discharge Instructions for Patients Receiving Chemotherapy  Today you received the following chemotherapy agents; Carboplatin; Taxol  To help prevent nausea and vomiting after your treatment, we encourage you to take your nausea medication as prescribed.   If you develop nausea and vomiting that is not controlled by your nausea medication, call the clinic.   BELOW ARE SYMPTOMS THAT SHOULD BE REPORTED IMMEDIATELY:  *FEVER GREATER THAN 100.5 F  *CHILLS WITH OR WITHOUT FEVER  NAUSEA AND VOMITING THAT IS NOT CONTROLLED WITH YOUR NAUSEA MEDICATION  *UNUSUAL SHORTNESS OF BREATH  *UNUSUAL BRUISING OR BLEEDING  TENDERNESS IN MOUTH AND THROAT WITH OR WITHOUT PRESENCE OF ULCERS  *URINARY PROBLEMS  *BOWEL PROBLEMS  UNUSUAL RASH Items with * indicate a potential emergency and should be followed up as soon as possible.  Feel free to call the clinic should you have any questions or concerns. The clinic phone number is [phone].  Please show the CHEMO ALERT CARD at check-in to the Emergency Department and triage nurse.   

## 2017-09-26 NOTE — Telephone Encounter (Signed)
Gave patient avs and calendar of upcoming appointments.  °

## 2017-09-26 NOTE — Progress Notes (Signed)
11:48 Taxol stopped at approximately 10 minutes after starting due to patient's face turning bright red. Drip stopped and IVF started. Medications given as ordered. Restarted Taxol at 12:33pm. Patient didn't have any further complications after restarting. MD aware.

## 2017-09-26 NOTE — Assessment & Plan Note (Signed)
The patient tolerated cycle 1 of treatment well She had mild G-CSF induced bone pain and nausea that resolved with conservative management I will proceed with cycle 2 of treatment without dose adjustment I plan to repeat CT imaging after cycle 3 of therapy I will order tumor marker monitoring once a month If she have excellent response to treatment, she will proceed with surgery in July

## 2017-09-26 NOTE — Assessment & Plan Note (Signed)
She  is at risk of worsening diabetes control during treatment We discussed dietary modification and close monitoring of blood sugar 

## 2017-09-27 ENCOUNTER — Other Ambulatory Visit: Payer: Self-pay | Admitting: Hematology and Oncology

## 2017-09-27 ENCOUNTER — Encounter: Payer: Self-pay | Admitting: Hematology and Oncology

## 2017-09-27 ENCOUNTER — Telehealth: Payer: Self-pay

## 2017-09-27 NOTE — Progress Notes (Signed)
   DATE: 09/26/2017     X CHEMO/IMMUNOTHERAPY REACTION            MD: Alvy Bimler   AGENT/BLOOD PRODUCT RECEIVING TODAY:   carboplatin and paclitaxel  AGENT/BLOOD PRODUCT RECEIVING IMMEDIATELY PRIOR TO REACTION: paclitaxel  VS: BP:     140/75 p:       108 SPO2:       95% on room air         REACTION(S): Flushing and nausea  PREMEDS: Aloxi, Benadryl 50 mg IV, Emend, and Pepcid 20 mg IV  INTERVENTION:  Solu-Medrol 125 mg IV x1 and  Ativan 0.5 mg IV x1  Review of Systems  Constitutional: Negative for diaphoresis.  HENT: Negative for trouble swallowing.   Respiratory: Negative for cough, choking, chest tightness, shortness of breath and wheezing.   Cardiovascular: Negative for chest pain and palpitations.  Gastrointestinal: Positive for nausea. Negative for vomiting.  Musculoskeletal: Negative for back pain.  Skin: Negative for color change and rash.       Flushing  Neurological: Negative for dizziness, light-headedness and headaches.    Physical Exam  Constitutional: No distress.  HENT:  Head: Normocephalic and atraumatic.  Cardiovascular: Normal rate, regular rhythm and normal heart sounds. Exam reveals no gallop and no friction rub.  No murmur heard. Pulmonary/Chest: Effort normal and breath sounds normal. No respiratory distress. She has no wheezes. She has no rales.  Neurological: She is alert.  Skin: Skin is warm and dry. No rash noted. She is not diaphoretic.  The patient's face was flushed.  This resolved after receiving Solu-Medrol 125 mg IV x1.    OUTCOME: Patient responded to intervention. Chemo/Immunotherapy restarted and completed.   Sandi Mealy, MHS, PA-C  This case was discussed with Dr. Alvy Bimler. She expresses agreement with my management of this patient.

## 2017-09-27 NOTE — Telephone Encounter (Signed)
-----   Message from [name], MD sent at 09/27/2017  3:10 PM EDT ----- Regarding: taxol reaction Can you call her and ask how she is doing? I am planning to switch from taxol to Abraxane (same drug, different suspension) so to avoid future reaction Treatment will be same but she should finish sooner Please suggest she takes premed dex just in case

## 2017-09-27 NOTE — Telephone Encounter (Signed)
She called back. Given below message. She verbalized understanding.  She is doing great today. No complaints, she is busy running errands. She will take the dexamethasone as ordered.

## 2017-09-27 NOTE — Telephone Encounter (Signed)
Called and left message asking her to call the nurse back.

## 2017-09-27 NOTE — Progress Notes (Signed)
The patient developed reaction to Taxol, second dose yesterday I recommend switching from Taxol to Abraxane I will get insurance prior authorization

## 2017-09-28 ENCOUNTER — Inpatient Hospital Stay: Payer: Medicare HMO

## 2017-09-28 VITALS — BP 120/68 | HR 79 | Temp 98.0°F | Resp 20

## 2017-09-28 DIAGNOSIS — R590 Localized enlarged lymph nodes: Secondary | ICD-10-CM

## 2017-09-28 DIAGNOSIS — C561 Malignant neoplasm of right ovary: Secondary | ICD-10-CM

## 2017-09-28 DIAGNOSIS — Z5111 Encounter for antineoplastic chemotherapy: Secondary | ICD-10-CM | POA: Diagnosis not present

## 2017-09-28 MED ORDER — PEGFILGRASTIM-CBQV 6 MG/0.6ML ~~LOC~~ SOSY
6.0000 mg | PREFILLED_SYRINGE | Freq: Once | SUBCUTANEOUS | Status: AC
  Administered 2017-09-28: 6 mg via SUBCUTANEOUS

## 2017-09-28 MED ORDER — PEGFILGRASTIM-CBQV 6 MG/0.6ML ~~LOC~~ SOSY
PREFILLED_SYRINGE | SUBCUTANEOUS | Status: AC
  Filled 2017-09-28: qty 0.6

## 2017-09-28 NOTE — Patient Instructions (Signed)
Pegfilgrastim injection What is this medicine? PEGFILGRASTIM (PEG fil gra stim) is a long-acting granulocyte colony-stimulating factor that stimulates the growth of neutrophils, a type of white blood cell important in the body's fight against infection. It is used to reduce the incidence of fever and infection in patients with certain types of cancer who are receiving chemotherapy that affects the bone marrow, and to increase survival after being exposed to high doses of radiation. This medicine may be used for other purposes; ask your health care provider or pharmacist if you have questions. COMMON BRAND NAME(S): Neulasta What should I tell my health care provider before I take this medicine? They need to know if you have any of these conditions: -kidney disease -latex allergy -ongoing radiation therapy -sickle cell disease -skin reactions to acrylic adhesives (On-Body Injector only) -an unusual or allergic reaction to pegfilgrastim, filgrastim, other medicines, foods, dyes, or preservatives -pregnant or trying to get pregnant -breast-feeding How should I use this medicine? This medicine is for injection under the skin. If you get this medicine at home, you will be taught how to prepare and give the pre-filled syringe or how to use the On-body Injector. Refer to the patient Instructions for Use for detailed instructions. Use exactly as directed. Tell your healthcare provider immediately if you suspect that the On-body Injector may not have performed as intended or if you suspect the use of the On-body Injector resulted in a missed or partial dose. It is important that you put your used needles and syringes in a special sharps container. Do not put them in a trash can. If you do not have a sharps container, call your pharmacist or healthcare provider to get one. Talk to your pediatrician regarding the use of this medicine in children. While this drug may be prescribed for selected conditions,  precautions do apply. Overdosage: If you think you have taken too much of this medicine contact a poison control center or emergency room at once. NOTE: This medicine is only for you. Do not share this medicine with others. What if I miss a dose? It is important not to miss your dose. Call your doctor or health care professional if you miss your dose. If you miss a dose due to an On-body Injector failure or leakage, a new dose should be administered as soon as possible using a single prefilled syringe for manual use. What may interact with this medicine? Interactions have not been studied. Give your health care provider a list of all the medicines, herbs, non-prescription drugs, or dietary supplements you use. Also tell them if you smoke, drink alcohol, or use illegal drugs. Some items may interact with your medicine. This list may not describe all possible interactions. Give your health care provider a list of all the medicines, herbs, non-prescription drugs, or dietary supplements you use. Also tell them if you smoke, drink alcohol, or use illegal drugs. Some items may interact with your medicine. What should I watch for while using this medicine? You may need blood work done while you are taking this medicine. If you are going to need a MRI, CT scan, or other procedure, tell your doctor that you are using this medicine (On-Body Injector only). What side effects may I notice from receiving this medicine? Side effects that you should report to your doctor or health care professional as soon as possible: -allergic reactions like skin rash, itching or hives, swelling of the face, lips, or tongue -dizziness -fever -pain, redness, or irritation at site   where injected -pinpoint red spots on the skin -red or dark-brown urine -shortness of breath or breathing problems -stomach or side pain, or pain at the shoulder -swelling -tiredness -trouble passing urine or change in the amount of urine Side  effects that usually do not require medical attention (report to your doctor or health care professional if they continue or are bothersome): -bone pain -muscle pain This list may not describe all possible side effects. Call your doctor for medical advice about side effects. You may report side effects to FDA at 1-800-FDA-1088. Where should I keep my medicine? Keep out of the reach of children. Store pre-filled syringes in a refrigerator between 2 and 8 degrees C (36 and 46 degrees F). Do not freeze. Keep in carton to protect from light. Throw away this medicine if it is left out of the refrigerator for more than 48 hours. Throw away any unused medicine after the expiration date. NOTE: This sheet is a summary. It may not cover all possible information. If you have questions about this medicine, talk to your doctor, pharmacist, or health care provider.  2018 Elsevier/Gold Standard (2016-04-06 12:58:03)  

## 2017-10-10 ENCOUNTER — Telehealth: Payer: Self-pay | Admitting: *Deleted

## 2017-10-10 NOTE — Telephone Encounter (Signed)
Faxed ROI to [employer]; release [phone]

## 2017-10-17 ENCOUNTER — Encounter: Payer: Self-pay | Admitting: Hematology and Oncology

## 2017-10-17 ENCOUNTER — Encounter: Payer: Self-pay | Admitting: Oncology

## 2017-10-17 ENCOUNTER — Inpatient Hospital Stay (HOSPITAL_BASED_OUTPATIENT_CLINIC_OR_DEPARTMENT_OTHER): Payer: Medicare HMO | Admitting: Hematology and Oncology

## 2017-10-17 ENCOUNTER — Inpatient Hospital Stay: Payer: Medicare HMO

## 2017-10-17 DIAGNOSIS — E119 Type 2 diabetes mellitus without complications: Secondary | ICD-10-CM

## 2017-10-17 DIAGNOSIS — C561 Malignant neoplasm of right ovary: Secondary | ICD-10-CM

## 2017-10-17 DIAGNOSIS — R110 Nausea: Secondary | ICD-10-CM | POA: Insufficient documentation

## 2017-10-17 DIAGNOSIS — R590 Localized enlarged lymph nodes: Secondary | ICD-10-CM

## 2017-10-17 DIAGNOSIS — T451X5A Adverse effect of antineoplastic and immunosuppressive drugs, initial encounter: Secondary | ICD-10-CM

## 2017-10-17 DIAGNOSIS — R112 Nausea with vomiting, unspecified: Secondary | ICD-10-CM | POA: Insufficient documentation

## 2017-10-17 DIAGNOSIS — Z5111 Encounter for antineoplastic chemotherapy: Secondary | ICD-10-CM | POA: Diagnosis not present

## 2017-10-17 DIAGNOSIS — G620 Drug-induced polyneuropathy: Secondary | ICD-10-CM | POA: Insufficient documentation

## 2017-10-17 LAB — CMP (CANCER CENTER ONLY)
ALBUMIN: 4.2 g/dL (ref 3.5–5.0)
ALK PHOS: 80 U/L (ref 38–126)
ALT: 16 U/L (ref 0–44)
ANION GAP: 13 (ref 5–15)
AST: 20 U/L (ref 15–41)
BILIRUBIN TOTAL: 0.3 mg/dL (ref 0.3–1.2)
BUN: 14 mg/dL (ref 8–23)
CALCIUM: 9.7 mg/dL (ref 8.9–10.3)
CO2: 24 mmol/L (ref 22–32)
Chloride: 104 mmol/L (ref 98–111)
Creatinine: 0.82 mg/dL (ref 0.44–1.00)
GFR, Est AFR Am: >60 mL/min (ref >60)
GFR, Estimated: >60 mL/min (ref >60)
Glucose, Bld: 209 mg/dL — ABNORMAL HIGH (ref 70–99)
Potassium: 4.2 mmol/L (ref 3.5–5.1)
Sodium: 141 mmol/L (ref 135–145)
Total Protein: 7.9 g/dL (ref 6.5–8.1)

## 2017-10-17 LAB — CBC WITH DIFFERENTIAL (CANCER CENTER ONLY)
BASOS ABS: 0 10*3/uL (ref 0.0–0.1)
BASOS PCT: 0 %
Eosinophils Absolute: 0 10*3/uL (ref 0.0–0.5)
Eosinophils Relative: 0 %
HCT: 37 % (ref 34.8–46.6)
HEMOGLOBIN: 12.1 g/dL (ref 11.6–15.9)
LYMPHS PCT: 19 %
Lymphs Abs: 1.1 10*3/uL (ref 0.9–3.3)
MCH: 29.3 pg (ref 25.1–34.0)
MCHC: 32.8 g/dL (ref 31.5–36.0)
MCV: 89.3 fL (ref 79.5–101.0)
Monocytes Absolute: 0 10*3/uL — ABNORMAL LOW (ref 0.1–0.9)
Monocytes Relative: 1 %
NEUTROS PCT: 80 %
Neutro Abs: 4.5 10*3/uL (ref 1.5–6.5)
Platelet Count: 266 10*3/uL (ref 145–400)
RBC: 4.14 MIL/uL (ref 3.70–5.45)
RDW: 17 % — AB (ref 11.2–14.5)
WBC: 5.7 10*3/uL (ref 3.9–10.3)

## 2017-10-17 MED ORDER — SODIUM CHLORIDE 0.9 % IV SOLN
540.0000 mg | Freq: Once | INTRAVENOUS | Status: AC
  Administered 2017-10-17: 540 mg via INTRAVENOUS
  Filled 2017-10-17: qty 54

## 2017-10-17 MED ORDER — FAMOTIDINE IN NACL 20-0.9 MG/50ML-% IV SOLN
20.0000 mg | Freq: Once | INTRAVENOUS | Status: AC
  Administered 2017-10-17: 20 mg via INTRAVENOUS

## 2017-10-17 MED ORDER — DIPHENHYDRAMINE HCL 50 MG/ML IJ SOLN
50.0000 mg | Freq: Once | INTRAMUSCULAR | Status: AC
  Administered 2017-10-17: 50 mg via INTRAVENOUS

## 2017-10-17 MED ORDER — HEPARIN SOD (PORK) LOCK FLUSH 100 UNIT/ML IV SOLN
500.0000 [IU] | Freq: Once | INTRAVENOUS | Status: AC | PRN
  Administered 2017-10-17: 500 [IU]
  Filled 2017-10-17: qty 5

## 2017-10-17 MED ORDER — PALONOSETRON HCL INJECTION 0.25 MG/5ML
INTRAVENOUS | Status: AC
  Filled 2017-10-17: qty 5

## 2017-10-17 MED ORDER — SODIUM CHLORIDE 0.9 % IV SOLN
Freq: Once | INTRAVENOUS | Status: AC
  Administered 2017-10-17: 10:00:00 via INTRAVENOUS
  Filled 2017-10-17: qty 5

## 2017-10-17 MED ORDER — FAMOTIDINE IN NACL 20-0.9 MG/50ML-% IV SOLN
INTRAVENOUS | Status: AC
  Filled 2017-10-17: qty 50

## 2017-10-17 MED ORDER — PALONOSETRON HCL INJECTION 0.25 MG/5ML
0.2500 mg | Freq: Once | INTRAVENOUS | Status: AC
  Administered 2017-10-17: 0.25 mg via INTRAVENOUS

## 2017-10-17 MED ORDER — SODIUM CHLORIDE 0.9 % IV SOLN
Freq: Once | INTRAVENOUS | Status: AC
  Administered 2017-10-17: 10:00:00 via INTRAVENOUS

## 2017-10-17 MED ORDER — SODIUM CHLORIDE 0.9% FLUSH
10.0000 mL | INTRAVENOUS | Status: DC | PRN
  Administered 2017-10-17: 10 mL
  Filled 2017-10-17: qty 10

## 2017-10-17 MED ORDER — DIPHENHYDRAMINE HCL 50 MG/ML IJ SOLN
INTRAMUSCULAR | Status: AC
  Filled 2017-10-17: qty 1

## 2017-10-17 MED ORDER — PACLITAXEL PROTEIN-BOUND CHEMO INJECTION 100 MG
300.0000 mg | Freq: Once | INTRAVENOUS | Status: AC
Start: 2017-10-17 — End: 2017-10-17
  Administered 2017-10-17: 300 mg via INTRAVENOUS
  Filled 2017-10-17: qty 60

## 2017-10-17 NOTE — Progress Notes (Signed)
Confirmed Abraxane dose with MD. She will monitor and adjust the dose as needed - Concerns for tolerability with 260mg /m2 (q 3 week) Abraxane dose.  Hardie Pulley, PharmD, BCPS, BCOP

## 2017-10-17 NOTE — Patient Instructions (Addendum)
[facility] Discharge Instructions for Patients Receiving Chemotherapy  Today you received the following chemotherapy agents Abraxane, Carboplatin   . To help prevent nausea and vomiting after your treatment, we encourage you to take your nausea medication as prescribed. If you develop nausea and vomiting that is not controlled by your nausea medication, call the clinic.   BELOW ARE SYMPTOMS THAT SHOULD BE REPORTED IMMEDIATELY:  *FEVER GREATER THAN 100.5 F  *CHILLS WITH OR WITHOUT FEVER  NAUSEA AND VOMITING THAT IS NOT CONTROLLED WITH YOUR NAUSEA MEDICATION  *UNUSUAL SHORTNESS OF BREATH  *UNUSUAL BRUISING OR BLEEDING  TENDERNESS IN MOUTH AND THROAT WITH OR WITHOUT PRESENCE OF ULCERS  *URINARY PROBLEMS  *BOWEL PROBLEMS  UNUSUAL RASH Items with * indicate a potential emergency and should be followed up as soon as possible.  Feel free to call the clinic should you have any questions or concerns. The clinic phone number is [phone].  Please show the [facility] at check-in to the Emergency Department and triage nurse.  Nanoparticle Albumin-Bound Paclitaxel injection What is this medicine? NANOPARTICLE ALBUMIN-BOUND PACLITAXEL (Na no PAHR ti kuhl al BYOO muhn-bound PAK li TAX el) is a chemotherapy drug. It targets fast dividing cells, like cancer cells, and causes these cells to die. This medicine is used to treat advanced breast cancer and advanced lung cancer. This medicine may be used for other purposes; ask your health care provider or pharmacist if you have questions. COMMON BRAND NAME(S): Abraxane What should I tell my health care provider before I take this medicine? They need to know if you have any of these conditions: -kidney disease -liver disease -low blood counts, like low platelets, red blood cells, or white blood cells -recent or ongoing radiation therapy -an unusual or allergic reaction to paclitaxel, albumin, other chemotherapy, other  medicines, foods, dyes, or preservatives -pregnant or trying to get pregnant -breast-feeding How should I use this medicine? This drug is given as an infusion into a vein. It is administered in a hospital or clinic by a specially trained health care professional. Talk to your pediatrician regarding the use of this medicine in children. Special care may be needed. Overdosage: If you think you have taken too much of this medicine contact a poison control center or emergency room at once. NOTE: This medicine is only for you. Do not share this medicine with others. What if I miss a dose? It is important not to miss your dose. Call your doctor or health care professional if you are unable to keep an appointment. What may interact with this medicine? -cyclosporine -diazepam -ketoconazole -medicines to increase blood counts like filgrastim, pegfilgrastim, sargramostim -other chemotherapy drugs like cisplatin, doxorubicin, epirubicin, etoposide, teniposide, vincristine -quinidine -testosterone -vaccines -verapamil Talk to your doctor or health care professional before taking any of these medicines: -acetaminophen -aspirin -ibuprofen -ketoprofen -naproxen This list may not describe all possible interactions. Give your health care provider a list of all the medicines, herbs, non-prescription drugs, or dietary supplements you use. Also tell them if you smoke, drink alcohol, or use illegal drugs. Some items may interact with your medicine. What should I watch for while using this medicine? Your condition will be monitored carefully while you are receiving this medicine. You will need important blood work done while you are taking this medicine. This medicine can cause serious allergic reactions. If you experience allergic reactions like skin rash, itching or hives, swelling of the face, lips, or tongue, tell your doctor or health care  professional right away. In some cases, you may be given  additional medicines to help with side effects. Follow all directions for their use. This drug may make you feel generally unwell. This is not uncommon, as chemotherapy can affect healthy cells as well as cancer cells. Report any side effects. Continue your course of treatment even though you feel ill unless your doctor tells you to stop. Call your doctor or health care professional for advice if you get a fever, chills or sore throat, or other symptoms of a cold or flu. Do not treat yourself. This drug decreases your body's ability to fight infections. Try to avoid being around people who are sick. This medicine may increase your risk to bruise or bleed. Call your doctor or health care professional if you notice any unusual bleeding. Be careful brushing and flossing your teeth or using a toothpick because you may get an infection or bleed more easily. If you have any dental work done, tell your dentist you are receiving this medicine. Avoid taking products that contain aspirin, acetaminophen, ibuprofen, naproxen, or ketoprofen unless instructed by your doctor. These medicines may hide a fever. Do not become pregnant while taking this medicine. Women should inform their doctor if they wish to become pregnant or think they might be pregnant. There is a potential for serious side effects to an unborn child. Talk to your health care professional or pharmacist for more information. Do not breast-feed an infant while taking this medicine. Men are advised not to father a child while receiving this medicine. What side effects may I notice from receiving this medicine? Side effects that you should report to your doctor or health care professional as soon as possible: -allergic reactions like skin rash, itching or hives, swelling of the face, lips, or tongue -low blood counts - This drug may decrease the number of white blood cells, red blood cells and platelets. You may be at increased risk for infections and  bleeding. -signs of infection - fever or chills, cough, sore throat, pain or difficulty passing urine -signs of decreased platelets or bleeding - bruising, pinpoint red spots on the skin, black, tarry stools, nosebleeds -signs of decreased red blood cells - unusually weak or tired, fainting spells, lightheadedness -breathing problems -changes in vision -chest pain -high or low blood pressure -mouth sores -nausea and vomiting -pain, swelling, redness or irritation at the injection site -pain, tingling, numbness in the hands or feet -slow or irregular heartbeat -swelling of the ankle, feet, hands Side effects that usually do not require medical attention (report to your doctor or health care professional if they continue or are bothersome): -aches, pains -changes in the color of fingernails -diarrhea -hair loss -loss of appetite This list may not describe all possible side effects. Call your doctor for medical advice about side effects. You may report side effects to FDA at 1-800-FDA-1088. Where should I keep my medicine? This drug is given in a hospital or clinic and will not be stored at home. NOTE: This sheet is a summary. It may not cover all possible information. If you have questions about this medicine, talk to your doctor, pharmacist, or health care provider.  2018 Elsevier/Gold Standard (2015-02-10 10:05:20)

## 2017-10-17 NOTE — Assessment & Plan Note (Addendum)
Clinically, she felt better since treatment She felt that her abdominal swelling is improving Her weight loss is likely due to that Due to reaction to Taxol, I will switch to Abraxane She tolerated chemotherapy well except for very mild peripheral neuropathy and mild nausea We will proceed with treatment without delay I plan to repeat imaging study next month for objective response to treatment

## 2017-10-17 NOTE — Assessment & Plan Note (Signed)
she has mild peripheral neuropathy, likely related to side effects of treatment. It is only mild, not bothering the patient. I will observe for now If it gets worse in the future, I will consider modifying the dose of the treatment  

## 2017-10-17 NOTE — Assessment & Plan Note (Signed)
She has mild nausea after chemotherapy, resolved with antiemetics She will continue the same

## 2017-10-17 NOTE — Progress Notes (Signed)
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[place]) Clinically, she felt better since treatment She felt that her abdominal swelling is improving Her weight loss is likely due to that Due to reaction to Taxol, I will switch to Abraxane She tolerated chemotherapy well except for very mild peripheral neuropathy and mild nausea We will proceed with treatment without delay I plan to repeat imaging study next month for objective response to treatment  Chemotherapy-induced nausea She has mild nausea after chemotherapy, resolved with antiemetics She will continue the same  Type 2 diabetes mellitus treated without insulin ([place]) She  is at risk of worsening diabetes control during treatment We discussed dietary modification and close monitoring of blood sugar  Peripheral neuropathy due to chemotherapy [facility]) she has mild peripheral neuropathy, likely related to side effects of treatment. It is only mild, not bothering the patient. I will observe for now If it gets worse in the future, I will consider modifying the dose of the treatment    No orders of the defined types were placed in this encounter.   INTERVAL HISTORY: Please see below for problem oriented charting. 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 The left adnexa measured 12.7 x 11.6 x 8.1 with low level echoes and soft tissue nodules       07/30/2017 Tumor Marker    Patient's tumor was tested for the following markers: CA-125 Results of the tumor marker test revealed 521.3      08/17/2017 Pathology Results    The malignant cells are positive for PAX-8, cytokeratin 7, estrogen receptor, and faintly positive for GATA-3. They are negative for p53, GCDFP, and cytokeratin 20. The finding are consistent with a gynecologic primary carcinoma. Additional studies can be performed upon clinician request.      08/17/2017 Procedure    Technically successful CT-guided left lower quadrant omental mass core biopsy.      08/20/2017 PET scan    1. Cystic masses arising from the pelvis. The nodular component of the RIGHT cystic mass is intensely hypermetabolic consistent with malignant ovarian neoplasm. 2. Extensive hypermetabolic peritoneal thickening in the lower abdomen and upper pelvis, upper abdomen, and upper abdominal precordial fat and paradiaphragmatic fat. 3. Retroperitoneal nodal metastasis adjacent to the IVC at the level the kidneys. 4. No evidence of metastatic disease in the thorax other small effusion on the LEFT and nodal metastasis in the fat superior to the diaphragm. 5. Mild metabolic activity associated the distal esophagus is favored benign esophagitis.      08/20/2017 Imaging    CT chest 1. Bilateral cardiophrenic angle nodal metastasis. No additional findings to suggest metastatic disease to the chest. 2. Small left pleural effusion. 3. Peritoneal carcinomatosis noted within the abdomen. 4. Hepatic steatosis.  08/24/2017 Cancer Staging    Staging form: Ovary, Fallopian Tube, and Primary Peritoneal Carcinoma, AJCC 8th Edition - Clinical: Stage IV (cT3, cN1, cM1) - Signed by [name], MD on 08/24/2017      08/31/2017 Tumor Marker    Patient's tumor was tested for  the following markers: CA-125 Results of the tumor marker test revealed 819.9      09/26/2017 Adverse Reaction    She developed reaction to Taxol, managed successfully with additional premedications       REVIEW OF SYSTEMS:   Constitutional: Denies fevers, chills or abnormal weight loss Eyes: Denies blurriness of vision Ears, nose, mouth, throat, and face: Denies mucositis or sore throat Respiratory: Denies cough, dyspnea or wheezes Cardiovascular: Denies palpitation, chest discomfort or lower extremity swelling Skin: Denies abnormal skin rashes Lymphatics: Denies new lymphadenopathy or easy bruising Neurological:Denies numbness, tingling or new weaknesses Behavioral/Psych: Mood is stable, no new changes  All other systems were reviewed with the patient and are negative.  I have reviewed the past medical history, past surgical history, social history and family history with the patient and they are unchanged from previous note.  ALLERGIES:  has No Known Allergies.  MEDICATIONS:  Current Outpatient Medications  Medication Sig Dispense Refill  . acetaminophen (TYLENOL) 500 MG tablet Take 500 mg by mouth every 8 (eight) hours as needed for mild pain or moderate pain.    Marland Kitchen atenolol (TENORMIN) 25 MG tablet Take 25 mg by mouth at bedtime.     Marland Kitchen CALCIUM-MAGNESIUM-VITAMIN D PO Take 1 tablet by mouth daily.    Marland Kitchen dexamethasone (DECADRON) 4 MG tablet Take 3 tabs the night before chemo and 3 tabs in the morning of chemotherapy, every 3 weeks 36 tablet 0  . famotidine (PEPCID) 20 MG tablet Take 20 mg by mouth as needed for heartburn or indigestion.    . lidocaine-prilocaine (EMLA) cream Apply 1 application topically as needed. 30 g 6  . loratadine (CLARITIN) 10 MG tablet Take 10 mg by mouth daily as needed for allergies.    . metFORMIN (GLUCOPHAGE) 1000 MG tablet Take 500 mg by mouth 2 (two) times daily.  0  . Multiple Vitamin (MULTIVITAMIN WITH MINERALS) TABS tablet Take 1 tablet by mouth  daily.    . ondansetron (ZOFRAN) 8 MG tablet Take 1 tablet (8 mg total) by mouth every 8 (eight) hours as needed for nausea. 30 tablet 3  . prochlorperazine (COMPAZINE) 10 MG tablet Take 1 tablet (10 mg total) by mouth every 6 (six) hours as needed for nausea or vomiting. 30 tablet 0   No current facility-administered medications for this visit.     PHYSICAL EXAMINATION: ECOG PERFORMANCE STATUS: 1 - Symptomatic but completely ambulatory  Vitals:   10/17/17 0825  BP: 138/88  Pulse: (!) 103  Resp: 18  Temp: 98 F (36.7 C)  SpO2: 99%   Filed Weights   10/17/17 0825  Weight: 156 lb 9.6 oz (71 kg)    GENERAL:alert, no distress and comfortable SKIN: skin color, texture, turgor are normal, no rashes or significant lesions EYES: normal, Conjunctiva are pink and non-injected, sclera clear OROPHARYNX:no exudate, no erythema and lips, buccal mucosa, and tongue normal  NECK: supple, thyroid normal size, non-tender, without nodularity LYMPH:  no palpable lymphadenopathy in the cervical, axillary or inguinal LUNGS: clear to auscultation and percussion with normal breathing effort HEART: regular rate & rhythm and no murmurs and no lower extremity edema ABDOMEN:abdomen soft, appear distended, no ascites Musculoskeletal:no cyanosis of digits and  no clubbing  NEURO: alert & oriented x 3 with fluent speech, no focal motor/sensory deficits  LABORATORY DATA:  I have reviewed the data as listed    Component Value Date/Time   NA 138 09/26/2017 0840   K 4.1 09/26/2017 0840   CL 103 09/26/2017 0840   CO2 25 09/26/2017 0840   GLUCOSE 233 (H) 09/26/2017 0840   BUN 13 09/26/2017 0840   CREATININE 0.89 09/26/2017 0840   CREATININE 0.86 06/05/2014 0957   CALCIUM 9.5 09/26/2017 0840   PROT 7.8 09/26/2017 0840   ALBUMIN 3.9 09/26/2017 0840   AST 21 09/26/2017 0840   ALT 13 09/26/2017 0840   ALKPHOS 71 09/26/2017 0840   BILITOT 0.3 09/26/2017 0840   GFRNONAA >60 09/26/2017 0840   GFRAA >60  09/26/2017 0840    No results found for: SPEP, UPEP  Lab Results  Component Value Date   WBC 5.7 10/17/2017   NEUTROABS 4.5 10/17/2017   HGB 12.1 10/17/2017   HCT 37.0 10/17/2017   MCV 89.3 10/17/2017   PLT 266 10/17/2017      Chemistry      Component Value Date/Time   NA 138 09/26/2017 0840   K 4.1 09/26/2017 0840   CL 103 09/26/2017 0840   CO2 25 09/26/2017 0840   BUN 13 09/26/2017 0840   CREATININE 0.89 09/26/2017 0840   CREATININE 0.86 06/05/2014 0957      Component Value Date/Time   CALCIUM 9.5 09/26/2017 0840   ALKPHOS 71 09/26/2017 0840   AST 21 09/26/2017 0840   ALT 13 09/26/2017 0840   BILITOT 0.3 09/26/2017 0840      All questions were answered. The patient knows to call the clinic with any problems, questions or concerns. No barriers to learning was detected.  I spent 15 minutes counseling the patient face to face. The total time spent in the appointment was 20 minutes and more than 50% was on counseling and review of test results  [name], MD 10/17/2017 8:38 AM

## 2017-10-17 NOTE — Assessment & Plan Note (Signed)
She  is at risk of worsening diabetes control during treatment We discussed dietary modification and close monitoring of blood sugar

## 2017-10-18 ENCOUNTER — Telehealth: Payer: Self-pay

## 2017-10-18 LAB — CA 125: Cancer Antigen (CA) 125: 374.4 U/mL — ABNORMAL HIGH (ref 0.0–38.1)

## 2017-10-18 NOTE — Telephone Encounter (Signed)
Called and given below message. Verbalized understanding. 

## 2017-10-18 NOTE — Progress Notes (Signed)
Per communication to MD from Arbie Cookey, PharmD - continue Taxol  premeds (Benadryl and Pepcid) with Abraxane for now. MD will re-evaluate premedications with the next cycle in 3 weeks.  Hardie Pulley, PharmD, BCPS, BCOP

## 2017-10-18 NOTE — Telephone Encounter (Signed)
-----   Message from [name], MD sent at 10/18/2017  7:29 AM EDT ----- Regarding: CA-125 Lab is much better. Please let her know

## 2017-10-19 ENCOUNTER — Inpatient Hospital Stay: Payer: Medicare HMO

## 2017-10-19 VITALS — BP 120/67 | HR 80 | Temp 98.0°F | Resp 20

## 2017-10-19 DIAGNOSIS — Z5111 Encounter for antineoplastic chemotherapy: Secondary | ICD-10-CM | POA: Diagnosis not present

## 2017-10-19 DIAGNOSIS — C561 Malignant neoplasm of right ovary: Secondary | ICD-10-CM

## 2017-10-19 DIAGNOSIS — R590 Localized enlarged lymph nodes: Secondary | ICD-10-CM

## 2017-10-19 MED ORDER — PEGFILGRASTIM-CBQV 6 MG/0.6ML ~~LOC~~ SOSY
PREFILLED_SYRINGE | SUBCUTANEOUS | Status: AC
  Filled 2017-10-19: qty 0.6

## 2017-10-19 MED ORDER — PEGFILGRASTIM-CBQV 6 MG/0.6ML ~~LOC~~ SOSY
6.0000 mg | PREFILLED_SYRINGE | Freq: Once | SUBCUTANEOUS | Status: AC
  Administered 2017-10-19: 6 mg via SUBCUTANEOUS

## 2017-10-19 NOTE — Patient Instructions (Signed)
Pegfilgrastim injection What is this medicine? PEGFILGRASTIM (PEG fil gra stim) is a long-acting granulocyte colony-stimulating factor that stimulates the growth of neutrophils, a type of white blood cell important in the body's fight against infection. It is used to reduce the incidence of fever and infection in patients with certain types of cancer who are receiving chemotherapy that affects the bone marrow, and to increase survival after being exposed to high doses of radiation. This medicine may be used for other purposes; ask your health care provider or pharmacist if you have questions. COMMON BRAND NAME(S): Neulasta What should I tell my health care provider before I take this medicine? They need to know if you have any of these conditions: -kidney disease -latex allergy -ongoing radiation therapy -sickle cell disease -skin reactions to acrylic adhesives (On-Body Injector only) -an unusual or allergic reaction to pegfilgrastim, filgrastim, other medicines, foods, dyes, or preservatives -pregnant or trying to get pregnant -breast-feeding How should I use this medicine? This medicine is for injection under the skin. If you get this medicine at home, you will be taught how to prepare and give the pre-filled syringe or how to use the On-body Injector. Refer to the patient Instructions for Use for detailed instructions. Use exactly as directed. Tell your healthcare provider immediately if you suspect that the On-body Injector may not have performed as intended or if you suspect the use of the On-body Injector resulted in a missed or partial dose. It is important that you put your used needles and syringes in a special sharps container. Do not put them in a trash can. If you do not have a sharps container, call your pharmacist or healthcare provider to get one. Talk to your pediatrician regarding the use of this medicine in children. While this drug may be prescribed for selected conditions,  precautions do apply. Overdosage: If you think you have taken too much of this medicine contact a poison control center or emergency room at once. NOTE: This medicine is only for you. Do not share this medicine with others. What if I miss a dose? It is important not to miss your dose. Call your doctor or health care professional if you miss your dose. If you miss a dose due to an On-body Injector failure or leakage, a new dose should be administered as soon as possible using a single prefilled syringe for manual use. What may interact with this medicine? Interactions have not been studied. Give your health care provider a list of all the medicines, herbs, non-prescription drugs, or dietary supplements you use. Also tell them if you smoke, drink alcohol, or use illegal drugs. Some items may interact with your medicine. This list may not describe all possible interactions. Give your health care provider a list of all the medicines, herbs, non-prescription drugs, or dietary supplements you use. Also tell them if you smoke, drink alcohol, or use illegal drugs. Some items may interact with your medicine. What should I watch for while using this medicine? You may need blood work done while you are taking this medicine. If you are going to need a MRI, CT scan, or other procedure, tell your doctor that you are using this medicine (On-Body Injector only). What side effects may I notice from receiving this medicine? Side effects that you should report to your doctor or health care professional as soon as possible: -allergic reactions like skin rash, itching or hives, swelling of the face, lips, or tongue -dizziness -fever -pain, redness, or irritation at site   where injected -pinpoint red spots on the skin -red or dark-brown urine -shortness of breath or breathing problems -stomach or side pain, or pain at the shoulder -swelling -tiredness -trouble passing urine or change in the amount of urine Side  effects that usually do not require medical attention (report to your doctor or health care professional if they continue or are bothersome): -bone pain -muscle pain This list may not describe all possible side effects. Call your doctor for medical advice about side effects. You may report side effects to FDA at 1-800-FDA-1088. Where should I keep my medicine? Keep out of the reach of children. Store pre-filled syringes in a refrigerator between 2 and 8 degrees C (36 and 46 degrees F). Do not freeze. Keep in carton to protect from light. Throw away this medicine if it is left out of the refrigerator for more than 48 hours. Throw away any unused medicine after the expiration date. NOTE: This sheet is a summary. It may not cover all possible information. If you have questions about this medicine, talk to your doctor, pharmacist, or health care provider.  2018 Elsevier/Gold Standard (2016-04-06 12:58:03)  

## 2017-10-29 ENCOUNTER — Other Ambulatory Visit: Payer: Self-pay | Admitting: Gynecologic Oncology

## 2017-10-29 DIAGNOSIS — C561 Malignant neoplasm of right ovary: Secondary | ICD-10-CM

## 2017-10-29 NOTE — Progress Notes (Signed)
Adding CT chest per Dr. Gerarda Fraction

## 2017-10-30 ENCOUNTER — Telehealth: Payer: Self-pay | Admitting: *Deleted

## 2017-10-30 ENCOUNTER — Encounter: Payer: Self-pay | Admitting: Gynecologic Oncology

## 2017-10-30 NOTE — Telephone Encounter (Signed)
Faxed ROI to [employer]; release [phone]

## 2017-10-30 NOTE — Progress Notes (Signed)
Additional clinicals given to [facility] for case # [id number] for CT chest.  CT chest approved from July 9-Jan 28, 2018 with approval # [id number].

## 2017-10-31 ENCOUNTER — Ambulatory Visit (HOSPITAL_COMMUNITY)
Admission: RE | Admit: 2017-10-31 | Discharge: 2017-10-31 | Disposition: A | Discharge status: Home / Self Care | Payer: Medicare HMO | Source: Ambulatory Visit | Attending: Hematology and Oncology | Admitting: Hematology and Oncology

## 2017-10-31 ENCOUNTER — Encounter (HOSPITAL_COMMUNITY): Payer: Self-pay

## 2017-10-31 DIAGNOSIS — C569 Malignant neoplasm of unspecified ovary: Secondary | ICD-10-CM | POA: Diagnosis not present

## 2017-10-31 DIAGNOSIS — C561 Malignant neoplasm of right ovary: Secondary | ICD-10-CM

## 2017-10-31 DIAGNOSIS — K760 Fatty (change of) liver, not elsewhere classified: Secondary | ICD-10-CM | POA: Diagnosis not present

## 2017-10-31 DIAGNOSIS — C786 Secondary malignant neoplasm of retroperitoneum and peritoneum: Secondary | ICD-10-CM | POA: Insufficient documentation

## 2017-10-31 DIAGNOSIS — J9811 Atelectasis: Secondary | ICD-10-CM | POA: Diagnosis not present

## 2017-10-31 DIAGNOSIS — Z5111 Encounter for antineoplastic chemotherapy: Secondary | ICD-10-CM | POA: Diagnosis not present

## 2017-10-31 MED ORDER — IOPAMIDOL (ISOVUE-300) INJECTION 61%
INTRAVENOUS | Status: AC
  Filled 2017-10-31: qty 100

## 2017-10-31 MED ORDER — IOPAMIDOL (ISOVUE-300) INJECTION 61%
100.0000 mL | Freq: Once | INTRAVENOUS | Status: AC | PRN
  Administered 2017-10-31: 100 mL via INTRAVENOUS

## 2017-10-31 NOTE — H&P (View-Only) (Signed)
GYNECOLOGIC ONCOLOGY - [facility] at [facility] at [facility]  Progress Note : GYN-ONC Established Patient FOLLOW-UP   Originally consult was requested by Dr. Evie Lacks   CC:  Chief Complaint  Patient presents with  . Right ovarian epithelial cancer [facility])    HPI: [name]  is a very nice 67 y.o.  P2   Interval History: Since her last visit she has had 2 more cycles of chemotherapy. Cycles thus far: 09/05/17 - C1 - Carbo / Taxol  09/26/17 - C2 - Carbo / Taxol with flushing noted 10/17/17 - C3 - Carbo / Abraxane due to Taxol reaction above.  Ca125 has decreased  . 07/30/2017-Ca125-  521.3 performed at [facility] . 08/31/17 = 819.9 (C1) . 09/26/17 = not done? (C2) . 10/17/17 = 374.4 (C3)  Imaging following Cycle 3 on  10/31/2017 -- There is a tubular filling defect extending superiorly into the right internal jugular vein (axial images 2 through 7/2), not typical for inflow artifact. This could reflect nonocclusive thrombus. The left internal jugular vein and SVC appear normal. Aberrant right subclavian artery noted. -- The previously demonstrated bilateral paracardiac nodes have improved. Node at the right cardiophrenic angle measures 8 mm short axis on image 43/2 (previously 19 mm). Left pericardiac node measures 9 mm on image 48/2 (previously 13 mm). Additional small mediastinal and axillary lymph nodes bilaterally are stable. There is mild distal esophageal wall thickening. The trachea appears unremarkable. Lungs/Pleura: There is no pleural effusion. Mild dependent atelectasis at both lung bases. No suspicious pulmonary nodules. Musculoskeletal/Chest wall: No chest wall mass or suspicious osseous findings. CT ABDOMEN AND PELVIS FINDINGS Hepatobiliary: Hepatic steatosis as before. No focal hepatic lesion or abnormal enhancement. No significant biliary dilatation status post cholecystectomy. Pancreas: Unremarkable. No pancreatic ductal dilatation or surrounding  inflammatory changes. Spleen: Normal in size without focal abnormality. Adrenals/Urinary Tract: Both adrenal glands appear normal. There is a stable cyst in the interpolar region of the right kidney. No evidence of renal mass, urinary tract calculus or hydronephrosis. No significant bladder findings. Stomach/Bowel: No evidence of bowel wall thickening, distention or surrounding inflammatory change. There is peripheral displacement of the bowel by the large bilateral adnexal lesions. Vascular/Lymphatic: There are no enlarged abdominal or pelvic lymph nodes. There are stable small inguinal lymph nodes bilaterally mild aortic and branch vessel atherosclerosis. There is flattening of the IVC without evidence of thrombosis. Reproductive: Previous hysterectomy. Large complex bilateral adnexal masses are again noted. The right adnexal lesion measures up to 21.5 x 13.6 cm transverse (image 77/2) and extends 24.7 cm cephalocaudad. The irregular solid components along the inferolateral aspect of this lesion have not significantly changed. Left adnexal lesion measures 13.0 x 7.3 x 13.6 cm and demonstrates increased density within a dependent component which measures 2.2 x 2.5 cm transverse on image 102/2. Other: Omental nodularity is similar to the previous studies, hypermetabolic on PET-CT. Largest component measures up to 5.3 x 1.8 cm on image 99/2. No significant associated ascites. Musculoskeletal: No acute or significant osseous findings. IMPRESSION: 1. No significant change in size of the large complex bilateral adnexal masses consistent with the known history of ovarian cancer. There is increased dependent density within the left-sided lesion. 2. Stable peritoneal carcinomatosis.  No significant ascites. 3. The bilateral pericardiac adenopathy has improved. No progressive thoracic metastatic disease. No pulmonary or osseous metastatic disease. 4. Suspicion of nonocclusive thrombus in the right internal jugular vein  related to the right IJ port. Recommend  further evaluation with Doppler ultrasound. 5. These results will be called to the ordering clinician or representative by the Radiologist Assistant, and communication documented in the PACS or zVision Dashboard. E  She presents for discussion of next step including the possibility of interval debulking. She has tolerated chemo well, with the exception of a Taxol reaction Cycle 2. She had Abraxane for Cycle 3.   Presenting History:  She presented to her gynecologist for her annual exam and noted symptoms of bloating along with decreased appetite however increased weight.  On physical exam her gynecologist noted a palpable mass and imaging was ordered including ultrasound.  Ultrasound revealed a complex cystic mass in the right adnexa measuring 20 x 11 x 12 cm with multiple internal septations some of which are thick.  The left adnexa measured 12.7 x 11.6 x 8.1 with low level echoes and soft tissue nodules.  This was 07/30/2017.  The same day she had a Ca125 equal to 521.3.  At some point a provider ordered an MRI of the abdomen and pelvis with and without contrast the lower chest revealed several prominent lower anterior mediastinal lymph nodes including a pericardial measuring 12 mm in short axis.  Hepatic steatosis.  Surface of the liver is noted to have several soft tissue areas of thickening and enhancement suspicious for serosal implants.  Peritoneal disease is expected in the upper abdomen adjacent to the liver and transverse colon.  Extensive omental and peritoneal implants are appreciated in the left lower quadrant.  She does have an abdominal MRI in 2018 for pancreatic cyst no abdominal pelvic mass or peritoneal disease was noted at that time.  Looking back she thinks that over the 6 months prior to presentation she noted she had just not been feeling her usual self with decreased energy and occasional discomfort in the abdomen.  Again she notes a weight gain  despite a decreased appetite.  She describes her pain as being vaginal and abdominal 5 out of 10 sharp to dull it is brief but aggravated by sitting she alleviates it with anti-inflammatories.  She does also note bloating increased symptoms of reflux and urinary urgency.  Her bowel movements do not seem to be affected she is having daily bowel movements and denies persistent nausea and vomiting.  She has had some nausea which occurs about twice per week.  On a side note she was a widow and recently married a new gentleman.  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 There is mucinous differentiation. In addition a PET was performed to look at the mediastinal/cardiophrenic lymph node.  Unfortunately that did show FDG uptake and appears to be consistent with metastatic disease which would make her unresectable in the upfront setting.  Measurement of disease:  Recent Labs    08/31/17 1118 10/17/17 0751  CBU384 819.9* 374.4*    . 07/30/2017-Ca125-  521.3 performed at [facility] . 08/31/17 = 819.9 (C1) . 09/26/17 = not done (C2) . 10/17/17 = 374.4   CEA and CA19-9 ordered 08/22/17 given the "mucinous" differentiation in the biopsy report -both normal   Radiology:   08/02/2017-MRI of the abdomen and pelvis with and without contrast -performed in [place], [place] -  the lower chest revealed several prominent lower anterior mediastinal lymph nodes including a pericardial measuring 12 mm in short axis.  Hepatic steatosis.  Surface of the liver is noted to have several soft tissue areas of thickening and enhancement suspicious for serosal implants.  Peritoneal  disease is expected in the upper abdomen adjacent to the liver and transverse colon.  Extensive omental and peritoneal implants are appreciated in the left lower quadrant.  08/20/2017 chest CT-[facility]-as noted in the oncologic history  08/20/2017-PET scan-[facility]-as noted in the oncologic  history 10/31/2017 - CT CAP - [facility] CLINICAL DATA:  Ovarian cancer diagnosed in April 2019. Chemotherapy in progress. No current complaints. EXAM: CT CHEST, ABDOMEN, AND PELVIS WITH CONTRAST TECHNIQUE: Multidetector CT imaging of the chest, abdomen and pelvis was performed following the standard protocol during bolus administration of intravenous contrast. CONTRAST:  163m ISOVUE-300 IOPAMIDOL (ISOVUE-300) INJECTION 61% COMPARISON:  Abdominopelvic MRI 08/02/2017, PET-CT 08/21/1998 chest CT 08/20/2017. FINDINGS: CT CHEST FINDINGS Cardiovascular: Interval placement of a right IJ Port-A-Cath extends to the lower SVC. There is a tubular filling defect extending superiorly into the right internal jugular vein (axial images 2 through 7/2), not typical for inflow artifact. This could reflect nonocclusive thrombus. The left internal jugular vein and SVC appear normal. Aberrant right subclavian artery noted. The heart size is normal. There is no pericardial effusion. Mediastinum/Nodes: The previously demonstrated bilateral paracardiac nodes have improved. Node at the right cardiophrenic angle measures 8 mm short axis on image 43/2 (previously 19 mm). Left pericardiac node measures 9 mm on image 48/2 (previously 13 mm). Additional small mediastinal and axillary lymph nodes bilaterally are stable. There is mild distal esophageal wall thickening. The trachea appears unremarkable. Lungs/Pleura: There is no pleural effusion. Mild dependent atelectasis at both lung bases. No suspicious pulmonary nodules. Musculoskeletal/Chest wall: No chest wall mass or suspicious osseous findings. CT ABDOMEN AND PELVIS FINDINGS Hepatobiliary: Hepatic steatosis as before. No focal hepatic lesion or abnormal enhancement. No significant biliary dilatation status post cholecystectomy. Pancreas: Unremarkable. No pancreatic ductal dilatation or surrounding inflammatory changes. Spleen: Normal in size without focal abnormality. Adrenals/Urinary Tract:  Both adrenal glands appear normal. There is a stable cyst in the interpolar region of the right kidney. No evidence of renal mass, urinary tract calculus or hydronephrosis. No significant bladder findings. Stomach/Bowel: No evidence of bowel wall thickening, distention or surrounding inflammatory change. There is peripheral displacement of the bowel by the large bilateral adnexal lesions. Vascular/Lymphatic: There are no enlarged abdominal or pelvic lymph nodes. There are stable small inguinal lymph nodes bilaterally mild aortic and branch vessel atherosclerosis. There is flattening of the IVC without evidence of thrombosis. Reproductive: Previous hysterectomy. Large complex bilateral adnexal masses are again noted. The right adnexal lesion measures up to 21.5 x 13.6 cm transverse (image 77/2) and extends 24.7 cm cephalocaudad. The irregular solid components along the inferolateral aspect of this lesion have not significantly changed. Left adnexal lesion measures 13.0 x 7.3 x 13.6 cm and demonstrates increased density within a dependent component which measures 2.2 x 2.5 cm transverse on image 102/2. Other: Omental nodularity is similar to the previous studies, hypermetabolic on PET-CT. Largest component measures up to 5.3 x 1.8 cm on image 99/2. No significant associated ascites. Musculoskeletal: No acute or significant osseous findings. IMPRESSION: 1. No significant change in size of the large complex bilateral adnexal masses consistent with the known history of ovarian cancer. There is increased dependent density within the left-sided lesion. 2. Stable peritoneal carcinomatosis.  No significant ascites. 3. The bilateral pericardiac adenopathy has improved. No progressive thoracic metastatic disease. No pulmonary or osseous metastatic disease. 4. Suspicion of nonocclusive thrombus in the right internal jugular vein related to the right IJ port. Recommend further evaluation with Doppler ultrasound. 5. These results  will  be called to the ordering clinician or representative by the Radiologist Assistant, and communication documented in the PACS or zVision Dashboard. Electronically Signed   By: Richardean Sale M.D.     Oncologic History:      Right ovarian epithelial cancer ([place])   07/30/2017 Imaging    US pelvis Ultrasound revealed a complex cystic mass in the right adnexa measuring 20 x 11 x 12 cm with multiple internal septations some of which are thick. The left adnexa measured 12.7 x 11.6 x 8.1 with low level echoes and soft tissue nodules       07/30/2017 Tumor Marker    Patient's tumor was tested for the following markers: CA-125 Results of the tumor marker test revealed 521.3      08/17/2017 Pathology Results    The malignant cells are positive for PAX-8, cytokeratin 7, estrogen receptor, and faintly positive for GATA-3. They are negative for p53, GCDFP, and cytokeratin 20. The finding are consistent with a gynecologic primary carcinoma. Additional studies can be performed upon clinician request.      08/17/2017 Procedure    Technically successful CT-guided left lower quadrant omental mass core biopsy.      08/20/2017 PET scan    1. Cystic masses arising from the pelvis. The nodular component of the RIGHT cystic mass is intensely hypermetabolic consistent with malignant ovarian neoplasm. 2. Extensive hypermetabolic peritoneal thickening in the lower abdomen and upper pelvis, upper abdomen, and upper abdominal precordial fat and paradiaphragmatic fat. 3. Retroperitoneal nodal metastasis adjacent to the IVC at the level the kidneys. 4. No evidence of metastatic disease in the thorax other small effusion on the LEFT and nodal metastasis in the fat superior to the diaphragm. 5. Mild metabolic activity associated the distal esophagus is favored benign esophagitis.      08/20/2017 Imaging    CT chest 1. Bilateral cardiophrenic angle nodal metastasis. No additional findings to suggest metastatic  disease to the chest. 2. Small left pleural effusion. 3. Peritoneal carcinomatosis noted within the abdomen. 4. Hepatic steatosis.       08/24/2017 Cancer Staging    Staging form: Ovary, Fallopian Tube, and Primary Peritoneal Carcinoma, AJCC 8th Edition - Clinical: Stage IV (cT3, cN1, cM1) - Signed by [name], MD on 08/24/2017      08/31/2017 Tumor Marker    Patient's tumor was tested for the following markers: CA-125 Results of the tumor marker test revealed 819.9      09/26/2017 Adverse Reaction    She developed reaction to Taxol, managed successfully with additional premedications      10/17/2017 Tumor Marker    Patient's tumor was tested for the following markers: CA-125 Results of the tumor marker test revealed 374.4      10/31/2017 Imaging    1. No significant change in size of the large complex bilateral adnexal masses consistent with the known history of ovarian cancer. There is increased dependent density within the left-sided lesion. 2. Stable peritoneal carcinomatosis.  No significant ascites.  3. The bilateral pericardiac adenopathy has improved. No progressive thoracic metastatic disease. No pulmonary or osseous metastatic disease. 4. Suspicion of nonocclusive thrombus in the right internal jugular vein related to the right IJ port. Recommend further evaluation with Doppler ultrasound.       Current Meds:  Outpatient Encounter Medications as of 11/02/2017  Medication Sig  . acetaminophen (TYLENOL) 500 MG tablet Take 500 mg by mouth every 8 (eight) hours as needed for mild pain or moderate pain.  Marland Kitchen  atenolol (TENORMIN) 25 MG tablet Take 25 mg by mouth at bedtime.   Marland Kitchen CALCIUM-MAGNESIUM-VITAMIN D PO Take 1 tablet by mouth daily.  Marland Kitchen dexamethasone (DECADRON) 4 MG tablet Take 3 tabs the night before chemo and 3 tabs in the morning of chemotherapy, every 3 weeks  . famotidine (PEPCID) 20 MG tablet Take 20 mg by mouth as needed for heartburn or indigestion.  .  lidocaine-prilocaine (EMLA) cream Apply 1 application topically as needed.  . loratadine (CLARITIN) 10 MG tablet Take 10 mg by mouth daily as needed for allergies.  . metFORMIN (GLUCOPHAGE) 1000 MG tablet Take 500 mg by mouth 2 (two) times daily.  . Multiple Vitamin (MULTIVITAMIN WITH MINERALS) TABS tablet Take 1 tablet by mouth daily.  . ondansetron (ZOFRAN) 8 MG tablet Take 1 tablet (8 mg total) by mouth every 8 (eight) hours as needed for nausea.  . prochlorperazine (COMPAZINE) 10 MG tablet Take 1 tablet (10 mg total) by mouth every 6 (six) hours as needed for nausea or vomiting.   No facility-administered encounter medications on file as of 11/02/2017.     Allergy: No Known Allergies  Social Hx:   Tobacco : never Alcohol: 5 EtOH per week Illicit Drug use: none  Past Surgical Hx:  Past Surgical History:  Procedure Laterality Date  . CHOLECYSTECTOMY N/A 07/06/2014   Procedure: LAPAROSCOPIC CHOLECYSTECTOMY;  Surgeon: Aviva Signs Md, MD;  Location: [facility] ORS;  Service: General;  Laterality: N/A;  . COLONOSCOPY N/A 06/26/2013   Procedure: COLONOSCOPY;  Surgeon: Rogene Houston, MD;  Location: [facility] ENDO SUITE;  Service: Endoscopy;  Laterality: N/A;  1030  . IR FLUORO GUIDE PORT INSERTION RIGHT  09/03/2017  . IR US GUIDE VASC ACCESS RIGHT  09/03/2017  . VAGINAL HYSTERECTOMY  2011    Past Medical Hx:  Past Medical History:  Diagnosis Date  . Diabetes mellitus without complication ([place])   . Diverticulosis   . Hypertension   . Osteopenia   . Ovarian cancer ([place])   . Pancreatic cyst     Past Gynecological History:   GYNECOLOGIC HISTORY:  No LMP recorded. Patient has had a hysterectomy. Menarche: 67 years old P 76 LMP 67 years old Contraceptive; yes, <10 years HRT <1 year  Last Pap s/p vaginal hysterectomy 2011 for "prolapse"  Family Hx:  Family History  Problem Relation Age of Onset  . Colon cancer Mother 54       colon ca  . Skin cancer Father 78       unsure type  . Cancer  Maternal Grandmother 101       unknown cancer  . Brain cancer Maternal Grandfather 70    Review of Systems:  Review of Systems  Constitutional: Positive for unexpected weight change.  All other systems reviewed and are negative. Chemo induced hair loss  Vitals:  Blood pressure 124/77, pulse 77, temperature 98.2 F (36.8 C), temperature source Oral, resp. rate 18, height 5' 5"  (1.651 m), weight 154 lb 9.6 oz (70.1 kg), SpO2 100 %. Body mass index is 25.73 kg/m.   Physical Exam: ECOG PERFORMANCE STATUS: 1 - Symptomatic but completely ambulatory  General :  Well developed, 67 y.o., female in no apparent distress HEENT:  Normocephalic/atraumatic, symmetric, EOMI, eyelids normal Neck:   No visible masses. No palpable adenopathy Respiratory:  Respirations unlabored, no use of accessory muscles CV:   Deferred Breast:  Deferred Musculoskeletal: Normal muscle strength.   Abdomen:  Visible fullness and distention.  Nontender; no palpable masses Extremities:  No erythema , no pain , no edema Skin:   Normal inspection.  No rash Neuro/Psych:  No focal motor deficit, no abnormal mental status. Normal gait. Normal affect. Alert and oriented to person, place, and time  Genito Urinary  11/02/17 Vulva: Normal external female genitalia.  Bladder/urethra: Urethral meatus normal in size and location. No lesions or   masses, well supported bladder Bimanual exam: palpably normal vaginal canal and cuff  Adnexa: + fullness in culdesac with nodularity. Feels like it mobilizes off of rectum on rectovaginal 08/03/17: Bimanual exam: palpable fullness but no specific nodularity Rectovaginal:  Good tone, fullness in the cul-de-sac but no cul de sac nodularity, no parametrial involvement or nodularity.  The rectal wall feels smooth but there is some mild compression anteriorly from the mass in the pelvis which is mildly mobile.  This compression starts at approximately 7-8 cm in from the anal  verge.  Oncologic Summary: 1. Ovarian adenocarcinoma  2. Cardiophrenic lymphadenopathy    Assessment/Plan: 1. Treatment tolerance o She had a taxol reaction and was changed to Abraxane o Otherwise tolerated well and she agrees to complete 3 additional chemotherapy cycles 2. Disease response o Seems to have improving CA125 and at least the cardiophrenic nodes have not increased in size and have had some response. The large pelvic masses and omentum don't seem to have had much response o We discussed these findings today 3. Surgery plan o I think it is reasonable to proceed with attempt at interval debulking o Surgical R/B/A were discussed and the surgical sketch reviewed. She will be given a copy of that today as well as one being placed into the chart.  i. Specifically we discussed risks of bowel resection and inability to resect all disease 4. Tentative date of November 13, 2017 for surgery when she will be about 3.5 weeks postchemo cycle 3. We'll be sure to check CBC prior to surgery 5. She was accompanied today by her new husband 6. Genetics referral made today   Isabel Caprice, MD  11/02/2017, 10:28 AM  Cc: Sinda Du, Linna Hoff, [place] (PCP) Gari Crown, MD (Referring OB/Gyn)

## 2017-10-31 NOTE — Progress Notes (Signed)
GYNECOLOGIC ONCOLOGY - [facility] at [facility] at [facility]  Progress Note : GYN-ONC Established Patient FOLLOW-UP   Originally consult was requested by Dr. Evie Lacks   CC:  Chief Complaint  Patient presents with  . Right ovarian epithelial cancer [facility])    HPI: [name]  is a very nice 67 y.o.  P2   Interval History: Since her last visit she has had 2 more cycles of chemotherapy. Cycles thus far: 09/05/17 - C1 - Carbo / Taxol  09/26/17 - C2 - Carbo / Taxol with flushing noted 10/17/17 - C3 - Carbo / Abraxane due to Taxol reaction above.  Ca125 has decreased  . 07/30/2017-Ca125-  521.3 performed at [place] . 08/31/17 = 819.9 (C1) . 09/26/17 = not done? (C2) . 10/17/17 = 374.4 (C3)  Imaging following Cycle 3 on  10/31/2017 -- There is a tubular filling defect extending superiorly into the right internal jugular vein (axial images 2 through 7/2), not typical for inflow artifact. This could reflect nonocclusive thrombus. The left internal jugular vein and SVC appear normal. Aberrant right subclavian artery noted. -- The previously demonstrated bilateral paracardiac nodes have improved. Node at the right cardiophrenic angle measures 8 mm short axis on image 43/2 (previously 19 mm). Left pericardiac node measures 9 mm on image 48/2 (previously 13 mm). Additional small mediastinal and axillary lymph nodes bilaterally are stable. There is mild distal esophageal wall thickening. The trachea appears unremarkable. Lungs/Pleura: There is no pleural effusion. Mild dependent atelectasis at both lung bases. No suspicious pulmonary nodules. Musculoskeletal/Chest wall: No chest wall mass or suspicious osseous findings. CT ABDOMEN AND PELVIS FINDINGS Hepatobiliary: Hepatic steatosis as before. No focal hepatic lesion or abnormal enhancement. No significant biliary dilatation status post cholecystectomy. Pancreas: Unremarkable. No pancreatic ductal dilatation or surrounding  inflammatory changes. Spleen: Normal in size without focal abnormality. Adrenals/Urinary Tract: Both adrenal glands appear normal. There is a stable cyst in the interpolar region of the right kidney. No evidence of renal mass, urinary tract calculus or hydronephrosis. No significant bladder findings. Stomach/Bowel: No evidence of bowel wall thickening, distention or surrounding inflammatory change. There is peripheral displacement of the bowel by the large bilateral adnexal lesions. Vascular/Lymphatic: There are no enlarged abdominal or pelvic lymph nodes. There are stable small inguinal lymph nodes bilaterally mild aortic and branch vessel atherosclerosis. There is flattening of the IVC without evidence of thrombosis. Reproductive: Previous hysterectomy. Large complex bilateral adnexal masses are again noted. The right adnexal lesion measures up to 21.5 x 13.6 cm transverse (image 77/2) and extends 24.7 cm cephalocaudad. The irregular solid components along the inferolateral aspect of this lesion have not significantly changed. Left adnexal lesion measures 13.0 x 7.3 x 13.6 cm and demonstrates increased density within a dependent component which measures 2.2 x 2.5 cm transverse on image 102/2. Other: Omental nodularity is similar to the previous studies, hypermetabolic on PET-CT. Largest component measures up to 5.3 x 1.8 cm on image 99/2. No significant associated ascites. Musculoskeletal: No acute or significant osseous findings. IMPRESSION: 1. No significant change in size of the large complex bilateral adnexal masses consistent with the known history of ovarian cancer. There is increased dependent density within the left-sided lesion. 2. Stable peritoneal carcinomatosis.  No significant ascites. 3. The bilateral pericardiac adenopathy has improved. No progressive thoracic metastatic disease. No pulmonary or osseous metastatic disease. 4. Suspicion of nonocclusive thrombus in the right internal jugular vein  related to the right IJ port. Recommend  further evaluation with Doppler ultrasound. 5. These results will be called to the ordering clinician or representative by the Radiologist Assistant, and communication documented in the PACS or zVision Dashboard. E  She presents for discussion of next step including the possibility of interval debulking. She has tolerated chemo well, with the exception of a Taxol reaction Cycle 2. She had Abraxane for Cycle 3.   Presenting History:  She presented to her gynecologist for her annual exam and noted symptoms of bloating along with decreased appetite however increased weight.  On physical exam her gynecologist noted a palpable mass and imaging was ordered including ultrasound.  Ultrasound revealed a complex cystic mass in the right adnexa measuring 20 x 11 x 12 cm with multiple internal septations some of which are thick.  The left adnexa measured 12.7 x 11.6 x 8.1 with low level echoes and soft tissue nodules.  This was 07/30/2017.  The same day she had a Ca125 equal to 521.3.  At some point a provider ordered an MRI of the abdomen and pelvis with and without contrast the lower chest revealed several prominent lower anterior mediastinal lymph nodes including a pericardial measuring 12 mm in short axis.  Hepatic steatosis.  Surface of the liver is noted to have several soft tissue areas of thickening and enhancement suspicious for serosal implants.  Peritoneal disease is expected in the upper abdomen adjacent to the liver and transverse colon.  Extensive omental and peritoneal implants are appreciated in the left lower quadrant.  She does have an abdominal MRI in 2018 for pancreatic cyst no abdominal pelvic mass or peritoneal disease was noted at that time.  Looking back she thinks that over the 6 months prior to presentation she noted she had just not been feeling her usual self with decreased energy and occasional discomfort in the abdomen.  Again she notes a weight gain  despite a decreased appetite.  She describes her pain as being vaginal and abdominal 5 out of 10 sharp to dull it is brief but aggravated by sitting she alleviates it with anti-inflammatories.  She does also note bloating increased symptoms of reflux and urinary urgency.  Her bowel movements do not seem to be affected she is having daily bowel movements and denies persistent nausea and vomiting.  She has had some nausea which occurs about twice per week.  On a side note she was a widow and recently married a new gentleman.  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 There is mucinous differentiation. In addition a PET was performed to look at the mediastinal/cardiophrenic lymph node.  Unfortunately that did show FDG uptake and appears to be consistent with metastatic disease which would make her unresectable in the upfront setting.  Measurement of disease:  Recent Labs    08/31/17 1118 10/17/17 0751  EXH371 819.9* 374.4*    . 07/30/2017-Ca125-  521.3 performed at [place] . 08/31/17 = 819.9 (C1) . 09/26/17 = not done (C2) . 10/17/17 = 374.4   CEA and CA19-9 ordered 08/22/17 given the "mucinous" differentiation in the biopsy report -both normal   Radiology:   08/02/2017-MRI of the abdomen and pelvis with and without contrast -performed in [place], [place] -  the lower chest revealed several prominent lower anterior mediastinal lymph nodes including a pericardial measuring 12 mm in short axis.  Hepatic steatosis.  Surface of the liver is noted to have several soft tissue areas of thickening and enhancement suspicious for serosal implants.  Peritoneal  disease is expected in the upper abdomen adjacent to the liver and transverse colon.  Extensive omental and peritoneal implants are appreciated in the left lower quadrant.  08/20/2017 chest CT-[place]-as noted in the oncologic history  08/20/2017-PET scan-[place]-as noted in the oncologic  history 10/31/2017 - CT CAP - [facility] CLINICAL DATA:  Ovarian cancer diagnosed in April 2019. Chemotherapy in progress. No current complaints. EXAM: CT CHEST, ABDOMEN, AND PELVIS WITH CONTRAST TECHNIQUE: Multidetector CT imaging of the chest, abdomen and pelvis was performed following the standard protocol during bolus administration of intravenous contrast. CONTRAST:  120m ISOVUE-300 IOPAMIDOL (ISOVUE-300) INJECTION 61% COMPARISON:  Abdominopelvic MRI 08/02/2017, PET-CT 08/21/1998 chest CT 08/20/2017. FINDINGS: CT CHEST FINDINGS Cardiovascular: Interval placement of a right IJ Port-A-Cath extends to the lower SVC. There is a tubular filling defect extending superiorly into the right internal jugular vein (axial images 2 through 7/2), not typical for inflow artifact. This could reflect nonocclusive thrombus. The left internal jugular vein and SVC appear normal. Aberrant right subclavian artery noted. The heart size is normal. There is no pericardial effusion. Mediastinum/Nodes: The previously demonstrated bilateral paracardiac nodes have improved. Node at the right cardiophrenic angle measures 8 mm short axis on image 43/2 (previously 19 mm). Left pericardiac node measures 9 mm on image 48/2 (previously 13 mm). Additional small mediastinal and axillary lymph nodes bilaterally are stable. There is mild distal esophageal wall thickening. The trachea appears unremarkable. Lungs/Pleura: There is no pleural effusion. Mild dependent atelectasis at both lung bases. No suspicious pulmonary nodules. Musculoskeletal/Chest wall: No chest wall mass or suspicious osseous findings. CT ABDOMEN AND PELVIS FINDINGS Hepatobiliary: Hepatic steatosis as before. No focal hepatic lesion or abnormal enhancement. No significant biliary dilatation status post cholecystectomy. Pancreas: Unremarkable. No pancreatic ductal dilatation or surrounding inflammatory changes. Spleen: Normal in size without focal abnormality. Adrenals/Urinary Tract:  Both adrenal glands appear normal. There is a stable cyst in the interpolar region of the right kidney. No evidence of renal mass, urinary tract calculus or hydronephrosis. No significant bladder findings. Stomach/Bowel: No evidence of bowel wall thickening, distention or surrounding inflammatory change. There is peripheral displacement of the bowel by the large bilateral adnexal lesions. Vascular/Lymphatic: There are no enlarged abdominal or pelvic lymph nodes. There are stable small inguinal lymph nodes bilaterally mild aortic and branch vessel atherosclerosis. There is flattening of the IVC without evidence of thrombosis. Reproductive: Previous hysterectomy. Large complex bilateral adnexal masses are again noted. The right adnexal lesion measures up to 21.5 x 13.6 cm transverse (image 77/2) and extends 24.7 cm cephalocaudad. The irregular solid components along the inferolateral aspect of this lesion have not significantly changed. Left adnexal lesion measures 13.0 x 7.3 x 13.6 cm and demonstrates increased density within a dependent component which measures 2.2 x 2.5 cm transverse on image 102/2. Other: Omental nodularity is similar to the previous studies, hypermetabolic on PET-CT. Largest component measures up to 5.3 x 1.8 cm on image 99/2. No significant associated ascites. Musculoskeletal: No acute or significant osseous findings. IMPRESSION: 1. No significant change in size of the large complex bilateral adnexal masses consistent with the known history of ovarian cancer. There is increased dependent density within the left-sided lesion. 2. Stable peritoneal carcinomatosis.  No significant ascites. 3. The bilateral pericardiac adenopathy has improved. No progressive thoracic metastatic disease. No pulmonary or osseous metastatic disease. 4. Suspicion of nonocclusive thrombus in the right internal jugular vein related to the right IJ port. Recommend further evaluation with Doppler ultrasound. 5. These results  will  be called to the ordering clinician or representative by the Radiologist Assistant, and communication documented in the PACS or zVision Dashboard. Electronically Signed   By: Richardean Sale M.D.     Oncologic History:      Right ovarian epithelial cancer ([place])   07/30/2017 Imaging    US pelvis Ultrasound revealed a complex cystic mass in the right adnexa measuring 20 x 11 x 12 cm with multiple internal septations some of which are thick. The left adnexa measured 12.7 x 11.6 x 8.1 with low level echoes and soft tissue nodules       07/30/2017 Tumor Marker    Patient's tumor was tested for the following markers: CA-125 Results of the tumor marker test revealed 521.3      08/17/2017 Pathology Results    The malignant cells are positive for PAX-8, cytokeratin 7, estrogen receptor, and faintly positive for GATA-3. They are negative for p53, GCDFP, and cytokeratin 20. The finding are consistent with a gynecologic primary carcinoma. Additional studies can be performed upon clinician request.      08/17/2017 Procedure    Technically successful CT-guided left lower quadrant omental mass core biopsy.      08/20/2017 PET scan    1. Cystic masses arising from the pelvis. The nodular component of the RIGHT cystic mass is intensely hypermetabolic consistent with malignant ovarian neoplasm. 2. Extensive hypermetabolic peritoneal thickening in the lower abdomen and upper pelvis, upper abdomen, and upper abdominal precordial fat and paradiaphragmatic fat. 3. Retroperitoneal nodal metastasis adjacent to the IVC at the level the kidneys. 4. No evidence of metastatic disease in the thorax other small effusion on the LEFT and nodal metastasis in the fat superior to the diaphragm. 5. Mild metabolic activity associated the distal esophagus is favored benign esophagitis.      08/20/2017 Imaging    CT chest 1. Bilateral cardiophrenic angle nodal metastasis. No additional findings to suggest metastatic  disease to the chest. 2. Small left pleural effusion. 3. Peritoneal carcinomatosis noted within the abdomen. 4. Hepatic steatosis.       08/24/2017 Cancer Staging    Staging form: Ovary, Fallopian Tube, and Primary Peritoneal Carcinoma, AJCC 8th Edition - Clinical: Stage IV (cT3, cN1, cM1) - Signed by [name], MD on 08/24/2017      08/31/2017 Tumor Marker    Patient's tumor was tested for the following markers: CA-125 Results of the tumor marker test revealed 819.9      09/26/2017 Adverse Reaction    She developed reaction to Taxol, managed successfully with additional premedications      10/17/2017 Tumor Marker    Patient's tumor was tested for the following markers: CA-125 Results of the tumor marker test revealed 374.4      10/31/2017 Imaging    1. No significant change in size of the large complex bilateral adnexal masses consistent with the known history of ovarian cancer. There is increased dependent density within the left-sided lesion. 2. Stable peritoneal carcinomatosis.  No significant ascites.  3. The bilateral pericardiac adenopathy has improved. No progressive thoracic metastatic disease. No pulmonary or osseous metastatic disease. 4. Suspicion of nonocclusive thrombus in the right internal jugular vein related to the right IJ port. Recommend further evaluation with Doppler ultrasound.       Current Meds:  Outpatient Encounter Medications as of 11/02/2017  Medication Sig  . acetaminophen (TYLENOL) 500 MG tablet Take 500 mg by mouth every 8 (eight) hours as needed for mild pain or moderate pain.  Marland Kitchen  atenolol (TENORMIN) 25 MG tablet Take 25 mg by mouth at bedtime.   Marland Kitchen CALCIUM-MAGNESIUM-VITAMIN D PO Take 1 tablet by mouth daily.  Marland Kitchen dexamethasone (DECADRON) 4 MG tablet Take 3 tabs the night before chemo and 3 tabs in the morning of chemotherapy, every 3 weeks  . famotidine (PEPCID) 20 MG tablet Take 20 mg by mouth as needed for heartburn or indigestion.  .  lidocaine-prilocaine (EMLA) cream Apply 1 application topically as needed.  . loratadine (CLARITIN) 10 MG tablet Take 10 mg by mouth daily as needed for allergies.  . metFORMIN (GLUCOPHAGE) 1000 MG tablet Take 500 mg by mouth 2 (two) times daily.  . Multiple Vitamin (MULTIVITAMIN WITH MINERALS) TABS tablet Take 1 tablet by mouth daily.  . ondansetron (ZOFRAN) 8 MG tablet Take 1 tablet (8 mg total) by mouth every 8 (eight) hours as needed for nausea.  . prochlorperazine (COMPAZINE) 10 MG tablet Take 1 tablet (10 mg total) by mouth every 6 (six) hours as needed for nausea or vomiting.   No facility-administered encounter medications on file as of 11/02/2017.     Allergy: No Known Allergies  Social Hx:   Tobacco : never Alcohol: 5 EtOH per week Illicit Drug use: none  Past Surgical Hx:  Past Surgical History:  Procedure Laterality Date  . CHOLECYSTECTOMY N/A 07/06/2014   Procedure: LAPAROSCOPIC CHOLECYSTECTOMY;  Surgeon: Aviva Signs Md, MD;  Location: [facility] ORS;  Service: General;  Laterality: N/A;  . COLONOSCOPY N/A 06/26/2013   Procedure: COLONOSCOPY;  Surgeon: Rogene Houston, MD;  Location: [facility] ENDO SUITE;  Service: Endoscopy;  Laterality: N/A;  1030  . IR FLUORO GUIDE PORT INSERTION RIGHT  09/03/2017  . IR US GUIDE VASC ACCESS RIGHT  09/03/2017  . VAGINAL HYSTERECTOMY  2011    Past Medical Hx:  Past Medical History:  Diagnosis Date  . Diabetes mellitus without complication ([place])   . Diverticulosis   . Hypertension   . Osteopenia   . Ovarian cancer ([place])   . Pancreatic cyst     Past Gynecological History:   GYNECOLOGIC HISTORY:  No LMP recorded. Patient has had a hysterectomy. Menarche: 67 years old P 24 LMP 67 years old Contraceptive; yes, <10 years HRT <1 year  Last Pap s/p vaginal hysterectomy 2011 for "prolapse"  Family Hx:  Family History  Problem Relation Age of Onset  . Colon cancer Mother 12       colon ca  . Skin cancer Father 27       unsure type  . Cancer  Maternal Grandmother 101       unknown cancer  . Brain cancer Maternal Grandfather 70    Review of Systems:  Review of Systems  Constitutional: Positive for unexpected weight change.  All other systems reviewed and are negative. Chemo induced hair loss  Vitals:  Blood pressure 124/77, pulse 77, temperature 98.2 F (36.8 C), temperature source Oral, resp. rate 18, height 5' 5"  (1.651 m), weight 154 lb 9.6 oz (70.1 kg), SpO2 100 %. Body mass index is 25.73 kg/m.   Physical Exam: ECOG PERFORMANCE STATUS: 1 - Symptomatic but completely ambulatory  General :  Well developed, 67 y.o., female in no apparent distress HEENT:  Normocephalic/atraumatic, symmetric, EOMI, eyelids normal Neck:   No visible masses. No palpable adenopathy Respiratory:  Respirations unlabored, no use of accessory muscles CV:   Deferred Breast:  Deferred Musculoskeletal: Normal muscle strength.   Abdomen:  Visible fullness and distention.  Nontender; no palpable masses Extremities:  No erythema , no pain , no edema Skin:   Normal inspection.  No rash Neuro/Psych:  No focal motor deficit, no abnormal mental status. Normal gait. Normal affect. Alert and oriented to person, place, and time  Genito Urinary  11/02/17 Vulva: Normal external female genitalia.  Bladder/urethra: Urethral meatus normal in size and location. No lesions or   masses, well supported bladder Bimanual exam: palpably normal vaginal canal and cuff  Adnexa: + fullness in culdesac with nodularity. Feels like it mobilizes off of rectum on rectovaginal 08/03/17: Bimanual exam: palpable fullness but no specific nodularity Rectovaginal:  Good tone, fullness in the cul-de-sac but no cul de sac nodularity, no parametrial involvement or nodularity.  The rectal wall feels smooth but there is some mild compression anteriorly from the mass in the pelvis which is mildly mobile.  This compression starts at approximately 7-8 cm in from the anal  verge.  Oncologic Summary: 1. Ovarian adenocarcinoma  2. Cardiophrenic lymphadenopathy    Assessment/Plan: 1. Treatment tolerance o She had a taxol reaction and was changed to Abraxane o Otherwise tolerated well and she agrees to complete 3 additional chemotherapy cycles 2. Disease response o Seems to have improving CA125 and at least the cardiophrenic nodes have not increased in size and have had some response. The large pelvic masses and omentum don't seem to have had much response o We discussed these findings today 3. Surgery plan o I think it is reasonable to proceed with attempt at interval debulking o Surgical R/B/A were discussed and the surgical sketch reviewed. She will be given a copy of that today as well as one being placed into the chart.  i. Specifically we discussed risks of bowel resection and inability to resect all disease 4. Tentative date of November 13, 2017 for surgery when she will be about 3.5 weeks postchemo cycle 3. We'll be sure to check CBC prior to surgery 5. She was accompanied today by her new husband 6. Genetics referral made today   Isabel Caprice, MD  11/02/2017, 10:28 AM  Cc: Sinda Du, Linna Hoff, [place] (PCP) Gari Crown, MD (Referring OB/Gyn)

## 2017-11-01 ENCOUNTER — Telehealth: Payer: Self-pay | Admitting: Hematology and Oncology

## 2017-11-01 NOTE — Telephone Encounter (Signed)
I spoke with interventional radiologist. She underwent CT imaging yesterday which show incidental finding of evidence of nonocclusive thrombus near the port site. The patient is not symptomatic I have discussed this findings with GYN oncologist's team She will be seeing GYN surgeon tomorrow for consideration for interval debulking surgery. If the patient is not a candidate for surgery, I might consider putting her on anticoagulation therapy

## 2017-11-02 ENCOUNTER — Telehealth: Payer: Self-pay | Admitting: Gynecologic Oncology

## 2017-11-02 ENCOUNTER — Inpatient Hospital Stay: Payer: Medicare HMO | Attending: Obstetrics | Admitting: Obstetrics

## 2017-11-02 ENCOUNTER — Encounter: Payer: Self-pay | Admitting: Obstetrics

## 2017-11-02 VITALS — BP 124/77 | HR 77 | Temp 98.2°F | Resp 18 | Ht 65.0 in | Wt 154.6 lb

## 2017-11-02 DIAGNOSIS — Z90710 Acquired absence of both cervix and uterus: Secondary | ICD-10-CM | POA: Insufficient documentation

## 2017-11-02 DIAGNOSIS — C561 Malignant neoplasm of right ovary: Secondary | ICD-10-CM | POA: Diagnosis not present

## 2017-11-02 NOTE — Patient Instructions (Signed)
Preparing for your Surgery  Plan for surgery on November 13, 2017 with Dr. Precious Haws at [facility] will be scheduled for an exploratory laparotomy, bilateral salpingo-oophorectomy, debulking.  Pre-operative Testing -You will receive a phone call from presurgical testing at [facility] to arrange for a pre-operative testing appointment before your surgery.  This appointment normally occurs one to two weeks before your scheduled surgery.   -Bring your insurance card, copy of an advanced directive if applicable, medication list  -At that visit, you will be asked to sign a consent for a possible blood transfusion in case a transfusion becomes necessary during surgery.  The need for a blood transfusion is rare but having consent is a necessary part of your care.     -You should not be taking blood thinners or aspirin at least ten days prior to surgery unless instructed by your surgeon.  Day Before Surgery at [place] will be asked to take in a light diet the day before surgery.  Avoid carbonated beverages.  You will be advised to have nothing to eat or drink after midnight the evening before.    Eat a light diet the day before surgery.  Examples including soups, broths, toast, yogurt, mashed potatoes.  Things to avoid include carbonated beverages (fizzy beverages), raw fruits and raw vegetables, or beans.   If your bowels are filled with gas, your surgeon will have difficulty visualizing your pelvic organs which increases your surgical risks.  Your role in recovery Your role is to become active as soon as directed by your doctor, while still giving yourself time to heal.  Rest when you feel tired. You will be asked to do the following in order to speed your recovery:  - Cough and breathe deeply. This helps toclear and expand your lungs and can prevent pneumonia. You may be given a spirometer to practice deep breathing. A staff member will show you how to use  the spirometer. - Do mild physical activity. Walking or moving your legs help your circulation and body functions return to normal. A staff member will help you when you try to walk and will provide you with simple exercises. Do not try to get up or walk alone the first time. - Actively manage your pain. Managing your pain lets you move in comfort. We will ask you to rate your pain on a scale of zero to 10. It is your responsibility to tell your doctor or nurse where and how much you hurt so your pain can be treated.  Special Considerations -If you are diabetic, you may be placed on insulin after surgery to have closer control over your blood sugars to promote healing and recovery.  This does not mean that you will be discharged on insulin.  If applicable, your oral antidiabetics will be resumed when you are tolerating a solid diet.  -Your final pathology results from surgery should be available by the Friday after surgery and the results will be relayed to you when available.  -Dr. Lahoma Crocker is the Surgeon that assists your GYN Oncologist with surgery.  The next day after your surgery you will either see your GYN Oncologist or Dr. Lahoma Crocker.   Blood Transfusion Information WHAT IS A BLOOD TRANSFUSION? A transfusion is the replacement of blood or some of its parts. Blood is made up of multiple cells which provide different functions.  Red blood cells carry oxygen and are used for blood loss replacement.  White blood  cells fight against infection.  Platelets control bleeding.  Plasma helps clot blood.  Other blood products are available for specialized needs, such as hemophilia or other clotting disorders. BEFORE THE TRANSFUSION  Who gives blood for transfusions?   You may be able to donate blood to be used at a later date on yourself (autologous donation).  Relatives can be asked to donate blood. This is generally not any safer than if you have received blood from a  stranger. The same precautions are taken to ensure safety when a relative's blood is donated.  Healthy volunteers who are fully evaluated to make sure their blood is safe. This is blood bank blood. Transfusion therapy is the safest it has ever been in the practice of medicine. Before blood is taken from a donor, a complete history is taken to make sure that person has no history of diseases nor engages in risky social behavior (examples are intravenous drug use or sexual activity with multiple partners). The donor's travel history is screened to minimize risk of transmitting infections, such as malaria. The donated blood is tested for signs of infectious diseases, such as HIV and hepatitis. The blood is then tested to be sure it is compatible with you in order to minimize the chance of a transfusion reaction. If you or a relative donates blood, this is often done in anticipation of surgery and is not appropriate for emergency situations. It takes many days to process the donated blood. RISKS AND COMPLICATIONS Although transfusion therapy is very safe and saves many lives, the main dangers of transfusion include:   Getting an infectious disease.  Developing a transfusion reaction. This is an allergic reaction to something in the blood you were given. Every precaution is taken to prevent this. The decision to have a blood transfusion has been considered carefully by your caregiver before blood is given. Blood is not given unless the benefits outweigh the risks.

## 2017-11-02 NOTE — Telephone Encounter (Signed)
Returned call to patient.  She had called stating she wanted to have a rolling walker to use post-op in case she is weak.  Advised her that we can assess when she is post-op and have it ordered while inpt and delivered to her at the hospital or at home.  Pt agreeable with this plan.  No concerns voiced.

## 2017-11-06 ENCOUNTER — Telehealth: Payer: Self-pay | Admitting: Genetic Counselor

## 2017-11-06 NOTE — Telephone Encounter (Signed)
Scheduled appt per 7/15 sch message - pt is aware of appt date and time   

## 2017-11-07 ENCOUNTER — Inpatient Hospital Stay (HOSPITAL_BASED_OUTPATIENT_CLINIC_OR_DEPARTMENT_OTHER): Payer: Medicare HMO | Admitting: Genetic Counselor

## 2017-11-07 ENCOUNTER — Inpatient Hospital Stay: Payer: Medicare HMO

## 2017-11-07 ENCOUNTER — Encounter: Payer: Self-pay | Admitting: Genetic Counselor

## 2017-11-07 ENCOUNTER — Other Ambulatory Visit: Payer: Medicare HMO

## 2017-11-07 ENCOUNTER — Ambulatory Visit: Payer: Medicare HMO | Admitting: Hematology and Oncology

## 2017-11-07 ENCOUNTER — Ambulatory Visit: Payer: Medicare HMO

## 2017-11-07 DIAGNOSIS — Z800 Family history of malignant neoplasm of digestive organs: Secondary | ICD-10-CM | POA: Diagnosis not present

## 2017-11-07 DIAGNOSIS — C561 Malignant neoplasm of right ovary: Secondary | ICD-10-CM

## 2017-11-07 DIAGNOSIS — Z808 Family history of malignant neoplasm of other organs or systems: Secondary | ICD-10-CM | POA: Diagnosis not present

## 2017-11-07 DIAGNOSIS — C569 Malignant neoplasm of unspecified ovary: Secondary | ICD-10-CM | POA: Diagnosis not present

## 2017-11-07 NOTE — Patient Instructions (Addendum)
Sabrina Vang  11/07/2017   Your procedure is scheduled on: 11-13-17   Report to [facility] Main  Entrance    Report to admitting at 6:30AM    Call this number if you have problems the morning of surgery [phone]     Remember: PER YOUR SURGEON ORDER : PLEASE [place] (including full and clear) the day before surgery. Do not eat food or drink liquids :After Midnight.      CLEAR LIQUID DIET   Foods Allowed                                                                     Foods Excluded  Coffee and tea, regular and decaf                             liquids that you cannot  Plain Jell-O in any flavor                                             see through such as: Fruit ices (not with fruit pulp)                                     milk, soups, orange juice  Iced Popsicles                                    All solid food Carbonated beverages, regular and diet                                    Cranberry, grape and apple juices Sports drinks like Gatorade Lightly seasoned clear broth or consume(fat free) Sugar, honey syrup  Sample Menu Breakfast                                Lunch                                     Supper Cranberry juice                    Beef broth                            Chicken broth Jell-O                                     Grape juice  Apple juice Coffee or tea                        Jell-O                                      Popsicle                                                Coffee or tea                        Coffee or tea  _____________________________________________________________________    Take these medicines the morning of surgery with A SIP OF WATER: TYLENOL, PEPCID IF NEEDED, LORATADINE IF NEEDED                               You may not have any metal on your body including hair pins and              piercings  Do not wear jewelry, make-up, lotions, powders or  perfumes, deodorant             Do not wear nail polish.  Do not shave  48 hours prior to surgery.               Do not bring valuables to the hospital. [place].  Contacts, dentures or bridgework may not be worn into surgery.  Leave suitcase in the car. After surgery it may be brought to your room.                  Please read over the following fact sheets you were given: _____________________________________________________________________             [facility] - Preparing for Surgery Before surgery, you can play an important role.  Because skin is not sterile, your skin needs to be as free of germs as possible.  You can reduce the number of germs on your skin by washing with CHG (chlorahexidine gluconate) soap before surgery.  CHG is an antiseptic cleaner which kills germs and bonds with the skin to continue killing germs even after washing. Please DO NOT use if you have an allergy to CHG or antibacterial soaps.  If your skin becomes reddened/irritated stop using the CHG and inform your nurse when you arrive at Short Stay. Do not shave (including legs and underarms) for at least 48 hours prior to the first CHG shower.  You may shave your face/neck. Please follow these instructions carefully:  1.  Shower with CHG Soap the night before surgery and the  morning of Surgery.  2.  If you choose to wash your hair, wash your hair first as usual with your  normal  shampoo.  3.  After you shampoo, rinse your hair and body thoroughly to remove the  shampoo.                           4.  Use CHG as you would any other liquid soap.  You can apply chg directly  to the skin and wash                       Gently with a scrungie or clean washcloth.  5.  Apply the CHG Soap to your body ONLY FROM THE NECK DOWN.   Do not use on face/ open                           Wound or open sores. Avoid contact with eyes, ears mouth and genitals (private parts).                        Wash face,  Genitals (private parts) with your normal soap.             6.  Wash thoroughly, paying special attention to the area where your surgery  will be performed.  7.  Thoroughly rinse your body with warm water from the neck down.  8.  DO NOT shower/wash with your normal soap after using and rinsing off  the CHG Soap.                9.  Pat yourself dry with a clean towel.            10.  Wear clean pajamas.            11.  Place clean sheets on your bed the night of your first shower and do not  sleep with pets. Day of Surgery : Do not apply any lotions/deodorants the morning of surgery.  Please wear clean clothes to the hospital/surgery center.  FAILURE TO FOLLOW THESE INSTRUCTIONS MAY RESULT IN THE CANCELLATION OF YOUR SURGERY PATIENT SIGNATURE_________________________________  NURSE SIGNATURE__________________________________  ________________________________________________________________________  How to Manage Your Diabetes Before and After Surgery  Why is it important to control my blood sugar before and after surgery? . Improving blood sugar levels before and after surgery helps healing and can limit problems. . A way of improving blood sugar control is eating a healthy diet by: o  Eating less sugar and carbohydrates o  Increasing activity/exercise o  Talking with your doctor about reaching your blood sugar goals . High blood sugars (greater than 180 mg/dL) can raise your risk of infections and slow your recovery, so you will need to focus on controlling your diabetes during the weeks before surgery. . Make sure that the doctor who takes care of your diabetes knows about your planned surgery including the date and location.  How do I manage my blood sugar before surgery? . Check your blood sugar at least 4 times a day, starting 2 days before surgery, to make sure that the level is not too high or low. o Check your blood sugar the morning of your  surgery when you wake up and every 2 hours until you get to the Short Stay unit. . If your blood sugar is less than 70 mg/dL, you will need to treat for low blood sugar: o Do not take insulin. o Treat a low blood sugar (less than 70 mg/dL) with  cup of clear juice (cranberry or apple), 4 glucose tablets, OR glucose gel. o Recheck blood sugar in 15 minutes after treatment (to make sure it is greater than 70 mg/dL). If your blood sugar is not greater than 70 mg/dL on recheck, call [phone] for further instructions. . Report your blood sugar to the  short stay nurse when you get to Short Stay.  . If you are admitted to the hospital after surgery: o Your blood sugar will be checked by the staff and you will probably be given insulin after surgery (instead of oral diabetes medicines) to make sure you have good blood sugar levels. o The goal for blood sugar control after surgery is 80-180 mg/dL.   WHAT DO I DO ABOUT MY DIABETES MEDICATION?      . THE MORNING OF SURGERY, Do not take oral diabetes medicines (pills) !    Patient Signature:  Date:   Nurse Signature:  Date:   Reviewed and Endorsed by [facility] Patient Education Committee, August 2015    Incentive Spirometer  An incentive spirometer is a tool that can help keep your lungs clear and active. This tool measures how well you are filling your lungs with each breath. Taking long deep breaths may help reverse or decrease the chance of developing breathing (pulmonary) problems (especially infection) following:  A long period of time when you are unable to move or be active. BEFORE THE PROCEDURE   If the spirometer includes an indicator to show your best effort, your nurse or respiratory therapist will set it to a desired goal.  If possible, sit up straight or lean slightly forward. Try not to slouch.  Hold the incentive spirometer in an upright position. INSTRUCTIONS FOR USE  1. Sit on the edge of your bed if possible, or  sit up as far as you can in bed or on a chair. 2. Hold the incentive spirometer in an upright position. 3. Breathe out normally. 4. Place the mouthpiece in your mouth and seal your lips tightly around it. 5. Breathe in slowly and as deeply as possible, raising the piston or the ball toward the top of the column. 6. Hold your breath for 3-5 seconds or for as long as possible. Allow the piston or ball to fall to the bottom of the column. 7. Remove the mouthpiece from your mouth and breathe out normally. 8. Rest for a few seconds and repeat Steps 1 through 7 at least 10 times every 1-2 hours when you are awake. Take your time and take a few normal breaths between deep breaths. 9. The spirometer may include an indicator to show your best effort. Use the indicator as a goal to work toward during each repetition. 10. After each set of 10 deep breaths, practice coughing to be sure your lungs are clear. If you have an incision (the cut made at the time of surgery), support your incision when coughing by placing a pillow or rolled up towels firmly against it. Once you are able to get out of bed, walk around indoors and cough well. You may stop using the incentive spirometer when instructed by your caregiver.  RISKS AND COMPLICATIONS  Take your time so you do not get dizzy or light-headed.  If you are in pain, you may need to take or ask for pain medication before doing incentive spirometry. It is harder to take a deep breath if you are having pain. AFTER USE  Rest and breathe slowly and easily.  It can be helpful to keep track of a log of your progress. Your caregiver can provide you with a simple table to help with this. If you are using the spirometer at home, follow these instructions: [place] IF:   You are having difficultly using the spirometer.  You have trouble using the spirometer  as often as instructed.  Your pain medication is not giving enough relief while using the  spirometer.  You develop fever of 100.5 F (38.1 C) or higher. SEEK IMMEDIATE MEDICAL CARE IF:   You cough up bloody sputum that had not been present before.  You develop fever of 102 F (38.9 C) or greater.  You develop worsening pain at or near the incision site. MAKE SURE YOU:   Understand these instructions.  Will watch your condition.  Will get help right away if you are not doing well or get worse. Document Released: 08/21/2006 Document Revised: 07/03/2011 Document Reviewed: 10/22/2006 Exit[employer] Patient Information 2014 [employer], [place].   ________________________________________________________________________  WHAT IS A BLOOD TRANSFUSION? Blood Transfusion Information  A transfusion is the replacement of blood or some of its parts. Blood is made up of multiple cells which provide different functions.  Red blood cells carry oxygen and are used for blood loss replacement.  White blood cells fight against infection.  Platelets control bleeding.  Plasma helps clot blood.  Other blood products are available for specialized needs, such as hemophilia or other clotting disorders. BEFORE THE TRANSFUSION  Who gives blood for transfusions?   Healthy volunteers who are fully evaluated to make sure their blood is safe. This is blood bank blood. Transfusion therapy is the safest it has ever been in the practice of medicine. Before blood is taken from a donor, a complete history is taken to make sure that person has no history of diseases nor engages in risky social behavior (examples are intravenous drug use or sexual activity with multiple partners). The donor's travel history is screened to minimize risk of transmitting infections, such as malaria. The donated blood is tested for signs of infectious diseases, such as HIV and hepatitis. The blood is then tested to be sure it is compatible with you in order to minimize the chance of a transfusion reaction. If you or a relative  donates blood, this is often done in anticipation of surgery and is not appropriate for emergency situations. It takes many days to process the donated blood. RISKS AND COMPLICATIONS Although transfusion therapy is very safe and saves many lives, the main dangers of transfusion include:   Getting an infectious disease.  Developing a transfusion reaction. This is an allergic reaction to something in the blood you were given. Every precaution is taken to prevent this. The decision to have a blood transfusion has been considered carefully by your caregiver before blood is given. Blood is not given unless the benefits outweigh the risks. AFTER THE TRANSFUSION  Right after receiving a blood transfusion, you will usually feel much better and more energetic. This is especially true if your red blood cells have gotten low (anemic). The transfusion raises the level of the red blood cells which carry oxygen, and this usually causes an energy increase.  The nurse administering the transfusion will monitor you carefully for complications. HOME CARE INSTRUCTIONS  No special instructions are needed after a transfusion. You may find your energy is better. Speak with your caregiver about any limitations on activity for underlying diseases you may have. SEEK MEDICAL CARE IF:   Your condition is not improving after your transfusion.  You develop redness or irritation at the intravenous (IV) site. SEEK IMMEDIATE MEDICAL CARE IF:  Any of the following symptoms occur over the next 12 hours:  Shaking chills.  You have a temperature by mouth above 102 F (38.9 C), not controlled by medicine.  Chest, back,  or muscle pain.  People around you feel you are not acting correctly or are confused.  Shortness of breath or difficulty breathing.  Dizziness and fainting.  You get a rash or develop hives.  You have a decrease in urine output.  Your urine turns a dark color or changes to pink, red, or brown. Any  of the following symptoms occur over the next 10 days:  You have a temperature by mouth above 102 F (38.9 C), not controlled by medicine.  Shortness of breath.  Weakness after normal activity.  The white part of the eye turns yellow (jaundice).  You have a decrease in the amount of urine or are urinating less often.  Your urine turns a dark color or changes to pink, red, or brown. Document Released: 04/07/2000 Document Revised: 07/03/2011 Document Reviewed: 11/25/2007 [facility] Patient Information 2014 [place], [place].  _______________________________________________________________________

## 2017-11-07 NOTE — Progress Notes (Signed)
REFERRING PROVIDER: [name], MD [address] [place], [place] [address]  PRIMARY PROVIDER:  Sinda Du, MD  PRIMARY REASON FOR VISIT:  1. 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 Family history of colon cancer   3. Family history of skin cancer      HISTORY OF PRESENT ILLNESS:   Sabrina Vang, a 67 y.o. female, was seen for a [place] cancer genetics consultation at the request of Dr. Alvy Bimler due to a personal and family history of cancer.  Sabrina Vang presents to clinic today to discuss the possibility of a hereditary predisposition to cancer, genetic testing, and to further clarify her future cancer risks, as well as potential cancer risks for family members.   In April 2019, at the age of 54, Sabrina Vang was diagnosed with cancer of the right ovary. This was treated with chemotherapy.  She will have surgery next Tuesday, July 23, and then will hopefully resume her chemotherapy afterward.     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 The left adnexa measured 12.7 x 11.6 x 8.1 with low level echoes and soft tissue nodules       07/30/2017 Tumor Marker    Patient's tumor was tested for the following markers: CA-125 Results of the tumor marker test revealed 521.3      08/17/2017 Pathology Results    The malignant cells are positive for PAX-8, cytokeratin 7, estrogen receptor, and faintly positive for GATA-3. They are negative for p53, GCDFP, and cytokeratin 20. The finding are consistent with a gynecologic primary carcinoma. Additional studies can be performed upon clinician request.      08/17/2017 Procedure    Technically successful CT-guided left lower quadrant omental mass core biopsy.      08/20/2017 PET scan    1. Cystic masses arising from the pelvis. The nodular  component of the RIGHT cystic mass is intensely hypermetabolic consistent with malignant ovarian neoplasm. 2. Extensive hypermetabolic peritoneal thickening in the lower abdomen and upper pelvis, upper abdomen, and upper abdominal precordial fat and paradiaphragmatic fat. 3. Retroperitoneal nodal metastasis adjacent to the IVC at the level the kidneys. 4. No evidence of metastatic disease in the thorax other small effusion on the LEFT and nodal metastasis in the fat superior to the diaphragm. 5. Mild metabolic activity associated the distal esophagus is favored benign esophagitis.      08/20/2017 Imaging    CT chest 1. Bilateral cardiophrenic angle nodal metastasis. No additional findings to suggest metastatic disease to the chest. 2. Small left pleural effusion. 3. Peritoneal carcinomatosis noted within the abdomen. 4. Hepatic steatosis.       08/24/2017 Cancer Staging    Staging form: Ovary, Fallopian Tube, and Primary Peritoneal Carcinoma, AJCC 8th Edition - Clinical: Stage IV (cT3, cN1, cM1) - Signed by [name], MD on 08/24/2017      08/31/2017 Tumor Marker    Patient's tumor was tested for the following markers: CA-125 Results of the tumor marker test revealed 819.9      09/26/2017 Adverse Reaction    She developed reaction to Taxol, managed successfully with additional premedications      10/17/2017 Tumor Marker    Patient's tumor was tested for the following markers: CA-125 Results of the tumor marker test revealed 374.4  10/31/2017 Imaging    1. No significant change in size of the large complex bilateral adnexal masses consistent with the known history of ovarian cancer. There is increased dependent density within the left-sided lesion. 2. Stable peritoneal carcinomatosis.  No significant ascites.  3. The bilateral pericardiac adenopathy has improved. No progressive thoracic metastatic disease. No pulmonary or osseous metastatic disease. 4. Suspicion of nonocclusive  thrombus in the right internal jugular vein related to the right IJ port. Recommend further evaluation with Doppler ultrasound.        HORMONAL RISK FACTORS:  Menarche was at age 59.  First live birth at age 89.  OCP use for approximately 4 years.  Ovaries intact: yes.  Hysterectomy: yes.  Menopausal status: postmenopausal.  HRT use: 0 years. Colonoscopy: yes; normal. Mammogram within the last year: yes. Number of breast biopsies: 0. Up to date with pelvic exams:  yes. Any excessive radiation exposure in the past:  no  Past Medical History:  Diagnosis Date  . Diabetes mellitus without complication ([place])   . Diverticulosis   . Family history of colon cancer   . Family history of skin cancer   . Hypertension   . Osteopenia   . Ovarian cancer ([place])   . Pancreatic cyst     Past Surgical History:  Procedure Laterality Date  . CHOLECYSTECTOMY N/A 07/06/2014   Procedure: LAPAROSCOPIC CHOLECYSTECTOMY;  Surgeon: Aviva Signs Md, MD;  Location: [facility];  Service: General;  Laterality: N/A;  . COLONOSCOPY N/A 06/26/2013   Procedure: COLONOSCOPY;  Surgeon: Rogene Houston, MD;  Location: [facility] ENDO SUITE;  Service: Endoscopy;  Laterality: N/A;  1030  . IR FLUORO GUIDE PORT INSERTION RIGHT  09/03/2017  . IR US GUIDE VASC ACCESS RIGHT  09/03/2017  . VAGINAL HYSTERECTOMY  2011    Social History   Socioeconomic History  . Marital status: Married    Spouse name: Rush Landmark  . Number of children: 2  . Years of education: Not on file  . Highest education level: Not on file  Occupational History  . Occupation: retired Microbiologist  . Financial resource strain: Not on file  . Food insecurity:    Worry: Not on file    Inability: Not on file  . Transportation needs:    Medical: Not on file    Non-medical: Not on file  Tobacco Use  . Smoking status: Never Smoker  . Smokeless tobacco: Never Used  Substance and Sexual Activity  . Alcohol use: Yes    Alcohol/week: 3.0 oz     Types: 5 Standard drinks or equivalent per week    Comment: occ  . Drug use: No  . Sexual activity: Yes    Birth control/protection: None  Lifestyle  . Physical activity:    Days per week: Not on file    Minutes per session: Not on file  . Stress: Not on file  Relationships  . Social connections:    Talks on phone: Not on file    Gets together: Not on file    Attends religious service: Not on file    Active member of club or organization: Not on file    Attends meetings of clubs or organizations: Not on file    Relationship status: Not on file  Other Topics Concern  . Not on file  Social History Narrative  . Not on file     FAMILY HISTORY:  We obtained a detailed, 4-generation family history.  Significant diagnoses are listed  below: Family History  Problem Relation Age of Onset  . Colon cancer Mother 70       colon ca  . Skin cancer Father 105       unsure type  . Heart attack Father 26       d. 79  . Stroke Maternal Grandmother   . Brain cancer Maternal Grandfather 80  . Dementia Maternal Aunt   . Diabetes Maternal Aunt   . Skin cancer Maternal Uncle   . Heart attack Paternal Aunt   . Fibromyalgia Sister   . Diabetes Sister   . Cancer Other 101       possible ovarian cancer, d. 102  . Prostate cancer Cousin        pat first cousin    The patient has two sons who are cancer free.  She ha three sisters and a brother who have never had cancer.  Her father is deceased from a heart attack at 24 and her mother is living at 44.  The patient's mother had colon cancer at 84. She also had a benign tumor removed from her abdomen, necessitating a TAH-BSO in her 30's.  She has three sisters and four brothers.  One brother had skin cancer and one sister died of Lewy-Body dementia.  The maternal grandparents are both deceased.  The grandfather had a brain stem tumor and the grandmother had strokes.  The grandmother had a mother who at 55 had an abdominal cancer, possibly  ovarian.  The patient's father had skin cancer in his 44's.  He died at 44 from a heart attack.  He had one sister who died of heart disease.  She had a benign tumor removed from her abdomen.  The paternal grandparents died before the patient was born.  Sabrina Vang is unaware of previous family history of genetic testing for hereditary cancer risks. Patient's maternal ancestors are of [place] and [place] descent, and paternal ancestors are of [place] descent. There is no reported Ashkenazi Jewish ancestry. There is no known consanguinity.  GENETIC COUNSELING ASSESSMENT: Sabrina Vang is a 67 y.o. female with a personal and family history of cancer which is somewhat suggestive of a hereditary cancer syndrome and predisposition to cancer. We, therefore, discussed and recommended the following at today's visit.   DISCUSSION: We discussed that up to 20% of ovarian cancer cases are hereditary, most commonly from BRCA mutations.  Based on her family history, we may be more concerned about Lynch syndrome.  That said, overall, her family history is relatively reassuring, with family members having their cancers at relatively advanced ages.    We reviewed the characteristics, features and inheritance patterns of hereditary cancer syndromes. We also discussed genetic testing, including the appropriate family members to test, the process of testing, insurance coverage and turn-around-time for results. We discussed the implications of a negative, positive and/or variant of uncertain significant result. We recommended Sabrina Vang pursue genetic testing for the [facility] gene panel. The [facility] gene panel offered by [employer] includes sequencing and deletion/duplication testing of the following 35 genes: APC, ATM, AXIN2, BARD1, BMPR1A, BRCA1, BRCA2, BRIP1, CHD1, CDK4, CDKN2A, CHEK2, EPCAM (large rearrangement only), HOXB13, (sequencing only), GALNT12, MLH1, MSH2, MSH3 (excluding repetitive  portions of exon 1), MSH6, MUTYH, NBN, NTHL1, PALB2, PMS2, PTEN, RAD51C, RAD51D, RNF43, RPS20, SMAD4, STK11, and TP53. Sequencing was performed for select regions of POLE and POLD1, and large rearrangement analysis was performed for select regions of GREM1.   We discussed that  genetic testing through [facility] will test for hereditary mutations that could explain her diagnosis of cancer.  However, homologous recombination testing (HRD) is genetic testing performed on her tumor that can determine genetic changes that could influence her management.  HRD testing is performed in tandem with genetic testing, and typically at no additional cost.    Based on Sabrina Vang's personal and family history of cancer, she meets medical criteria for genetic testing. Despite that she meets criteria, she may still have an out of pocket cost. We discussed that if her out of pocket cost for testing is over $100, the laboratory will call and confirm whether she wants to proceed with testing.  If the out of pocket cost of testing is less than $100 she will be billed by the genetic testing laboratory.   PLAN: After considering the risks, benefits, and limitations, Sabrina Vang  provided informed consent to pursue genetic testing and the blood sample was sent to [facility] for analysis of the [facility] panel. Results should be available within approximately 2-3 weeks' time, at which point they will be disclosed by telephone to Sabrina Vang, as will any additional recommendations warranted by these results. Sabrina Vang will receive a summary of her genetic counseling visit and a copy of her results once available. This information will also be available in Epic.   Her surgery will occur on November 13, 2017.  HRD testing will be sent out after surgery and should take an additional 2-3 weeks to get back.  We encouraged Sabrina Vang to remain in contact with cancer genetics annually so that we can continuously update the family history and inform  her of any changes in cancer genetics and testing that may be of benefit for her family. Sabrina Vang questions were answered to her satisfaction today. Our contact information was provided should additional questions or concerns arise.  Lastly, we encouraged Sabrina Vang to remain in contact with cancer genetics annually so that we can continuously update the family history and inform her of any changes in cancer genetics and testing that may be of benefit for this family.   Ms.  Vang questions were answered to her satisfaction today. Our contact information was provided should additional questions or concerns arise. Thank you for the referral and allowing [place] to share in the care of your patient.   Karen P. Florene Glen, [place], [facility] Certified Genetic Counselor [email or web address] phone: [phone]  The patient was seen for a total of 60 minutes in face-to-face genetic counseling.  This patient was discussed with Drs. Magrinat, Lindi Adie and/or Burr Medico who agrees with the above.    _______________________________________________________________________ For Office Staff:  Number of people involved in session: 2 Was an Intern/ student involved with case: no

## 2017-11-07 NOTE — Progress Notes (Signed)
CT CHEST 10-31-17 Epic

## 2017-11-08 ENCOUNTER — Telehealth: Payer: Self-pay | Admitting: Oncology

## 2017-11-08 ENCOUNTER — Encounter (HOSPITAL_COMMUNITY)
Admission: RE | Admit: 2017-11-08 | Discharge: 2017-11-08 | Disposition: A | Discharge status: Home / Self Care | Payer: Medicare HMO | Source: Ambulatory Visit | Attending: Obstetrics | Admitting: Obstetrics

## 2017-11-08 ENCOUNTER — Other Ambulatory Visit: Payer: Self-pay

## 2017-11-08 ENCOUNTER — Encounter (HOSPITAL_COMMUNITY): Payer: Self-pay

## 2017-11-08 DIAGNOSIS — C569 Malignant neoplasm of unspecified ovary: Secondary | ICD-10-CM | POA: Diagnosis not present

## 2017-11-08 DIAGNOSIS — Z01818 Encounter for other preprocedural examination: Secondary | ICD-10-CM | POA: Diagnosis not present

## 2017-11-08 DIAGNOSIS — I10 Essential (primary) hypertension: Secondary | ICD-10-CM | POA: Diagnosis not present

## 2017-11-08 DIAGNOSIS — Z01812 Encounter for preprocedural laboratory examination: Secondary | ICD-10-CM | POA: Insufficient documentation

## 2017-11-08 DIAGNOSIS — Z0183 Encounter for blood typing: Secondary | ICD-10-CM | POA: Diagnosis not present

## 2017-11-08 LAB — COMPREHENSIVE METABOLIC PANEL
ALK PHOS: 69 U/L (ref 38–126)
ALT: 20 U/L (ref 0–44)
AST: 24 U/L (ref 15–41)
Albumin: 4.3 g/dL (ref 3.5–5.0)
Anion gap: 10 (ref 5–15)
BUN: 20 mg/dL (ref 8–23)
CALCIUM: 9.8 mg/dL (ref 8.9–10.3)
CO2: 27 mmol/L (ref 22–32)
CREATININE: 0.78 mg/dL (ref 0.44–1.00)
Chloride: 104 mmol/L (ref 98–111)
Glucose, Bld: 102 mg/dL — ABNORMAL HIGH (ref 70–99)
Potassium: 4.4 mmol/L (ref 3.5–5.1)
Sodium: 141 mmol/L (ref 135–145)
Total Bilirubin: 0.5 mg/dL (ref 0.3–1.2)
Total Protein: 7.9 g/dL (ref 6.5–8.1)

## 2017-11-08 LAB — URINALYSIS, ROUTINE W REFLEX MICROSCOPIC
Bilirubin Urine: NEGATIVE
GLUCOSE, UA: NEGATIVE mg/dL
Hgb urine dipstick: NEGATIVE
Ketones, ur: NEGATIVE mg/dL
NITRITE: NEGATIVE
PH: 5 (ref 5.0–8.0)
Protein, ur: NEGATIVE mg/dL
SPECIFIC GRAVITY, URINE: 1.019 (ref 1.005–1.030)

## 2017-11-08 LAB — HEMOGLOBIN A1C
Hgb A1c MFr Bld: 6.6 % — ABNORMAL HIGH (ref 4.8–5.6)
Mean Plasma Glucose: 142.72 mg/dL

## 2017-11-08 LAB — CBC
HCT: 37.8 % (ref 36.0–46.0)
HEMOGLOBIN: 12.1 g/dL (ref 12.0–15.0)
MCH: 30.2 pg (ref 26.0–34.0)
MCHC: 32 g/dL (ref 30.0–36.0)
MCV: 94.3 fL (ref 78.0–100.0)
PLATELETS: 261 10*3/uL (ref 150–400)
RBC: 4.01 MIL/uL (ref 3.87–5.11)
RDW: 18.3 % — ABNORMAL HIGH (ref 11.5–15.5)
WBC: 9.7 10*3/uL (ref 4.0–10.5)

## 2017-11-08 LAB — GLUCOSE, CAPILLARY: GLUCOSE-CAPILLARY: 101 mg/dL — AB (ref 70–99)

## 2017-11-08 NOTE — Telephone Encounter (Signed)
Called [facility] and added on urine culture from patient's urinalysis from today.

## 2017-11-09 ENCOUNTER — Ambulatory Visit: Payer: Medicare HMO

## 2017-11-09 LAB — ABO/RH: ABO/RH(D): B NEG

## 2017-11-10 LAB — URINE CULTURE

## 2017-11-12 ENCOUNTER — Telehealth: Payer: Self-pay

## 2017-11-12 NOTE — Telephone Encounter (Signed)
Told Sabrina Vang that she can eat full liquid diet as well as clear liquids.  She could eat grits, oatmeal, pudding, ice cream, creamy soups and yogart per Joylene John, NP. Pt verbalized understanding.

## 2017-11-12 NOTE — Progress Notes (Signed)
Urine culture was added by Joylene John, NP after PAT visit completed and PAT labs had already resulted. Urine culture result routed to [facility] via epic .

## 2017-11-12 NOTE — Anesthesia Preprocedure Evaluation (Addendum)
Anesthesia Evaluation  Patient identified by MRN, date of birth, ID band Patient awake    Reviewed: Allergy & Precautions, NPO status , Patient's Chart, lab work & pertinent test results, reviewed documented beta blocker date and time   Airway Mallampati: II  TM Distance: >3 FB Neck ROM: Full    Dental no notable dental hx. (+) Teeth Intact, Dental Advisory Given   Pulmonary neg pulmonary ROS,    Pulmonary exam normal breath sounds clear to auscultation       Cardiovascular Exercise Tolerance: Good hypertension, Pt. on medications and Pt. on home beta blockers Normal cardiovascular exam Rhythm:Regular Rate:Normal     Neuro/Psych negative neurological ROS  negative psych ROS   GI/Hepatic negative GI ROS,   Endo/Other  diabetes, Type 2, Oral Hypoglycemic Agents  Renal/GU      Musculoskeletal negative musculoskeletal ROS (+)   Abdominal   Peds  Hematology negative hematology ROS (+)   Anesthesia Other Findings   Reproductive/Obstetrics                            Lab Results  Component Value Date   WBC 9.7 11/08/2017   HGB 12.1 11/08/2017   HCT 37.8 11/08/2017   MCV 94.3 11/08/2017   PLT 261 11/08/2017    Anesthesia Physical Anesthesia Plan  ASA: III  Anesthesia Plan: General   Post-op Pain Management:    Induction: Intravenous  PONV Risk Score and Plan: Treatment may vary due to age or medical condition, Ondansetron, Dexamethasone and Scopolamine patch - Pre-op  Airway Management Planned: Oral ETT  Additional Equipment:   Intra-op Plan:   Post-operative Plan:   Informed Consent: I have reviewed the patients History and Physical, chart, labs and discussed the procedure including the risks, benefits and alternatives for the proposed anesthesia with the patient or authorized representative who has indicated his/her understanding and acceptance.   Dental advisory  given  Plan Discussed with: CRNA and Anesthesiologist  Anesthesia Plan Comments:         Anesthesia Quick Evaluation

## 2017-11-13 ENCOUNTER — Encounter (HOSPITAL_COMMUNITY): Payer: Self-pay | Admitting: Emergency Medicine

## 2017-11-13 ENCOUNTER — Inpatient Hospital Stay (HOSPITAL_COMMUNITY): Payer: Medicare HMO | Admitting: Anesthesiology

## 2017-11-13 ENCOUNTER — Encounter (HOSPITAL_COMMUNITY)
Admission: RE | Disposition: A | Discharge status: Home Health | Payer: Self-pay | Source: Ambulatory Visit | Attending: Obstetrics

## 2017-11-13 ENCOUNTER — Inpatient Hospital Stay (HOSPITAL_COMMUNITY)
Admission: RE | Admit: 2017-11-13 | Discharge: 2017-11-18 | DRG: 737 | Disposition: A | Discharge status: Home Health | Payer: Medicare HMO | Source: Ambulatory Visit | Attending: Obstetrics | Admitting: Obstetrics

## 2017-11-13 DIAGNOSIS — C786 Secondary malignant neoplasm of retroperitoneum and peritoneum: Secondary | ICD-10-CM | POA: Diagnosis not present

## 2017-11-13 DIAGNOSIS — Z90710 Acquired absence of both cervix and uterus: Secondary | ICD-10-CM

## 2017-11-13 DIAGNOSIS — C784 Secondary malignant neoplasm of small intestine: Secondary | ICD-10-CM | POA: Diagnosis not present

## 2017-11-13 DIAGNOSIS — Z9221 Personal history of antineoplastic chemotherapy: Secondary | ICD-10-CM | POA: Diagnosis not present

## 2017-11-13 DIAGNOSIS — E876 Hypokalemia: Secondary | ICD-10-CM

## 2017-11-13 DIAGNOSIS — Z95828 Presence of other vascular implants and grafts: Secondary | ICD-10-CM

## 2017-11-13 DIAGNOSIS — R0902 Hypoxemia: Secondary | ICD-10-CM | POA: Diagnosis not present

## 2017-11-13 DIAGNOSIS — Z8719 Personal history of other diseases of the digestive system: Secondary | ICD-10-CM

## 2017-11-13 DIAGNOSIS — R112 Nausea with vomiting, unspecified: Secondary | ICD-10-CM

## 2017-11-13 DIAGNOSIS — K668 Other specified disorders of peritoneum: Secondary | ICD-10-CM

## 2017-11-13 DIAGNOSIS — C569 Malignant neoplasm of unspecified ovary: Secondary | ICD-10-CM | POA: Diagnosis not present

## 2017-11-13 DIAGNOSIS — R1902 Left upper quadrant abdominal swelling, mass and lump: Secondary | ICD-10-CM | POA: Diagnosis present

## 2017-11-13 DIAGNOSIS — Z7984 Long term (current) use of oral hypoglycemic drugs: Secondary | ICD-10-CM

## 2017-11-13 DIAGNOSIS — R000 Tachycardia, unspecified: Secondary | ICD-10-CM | POA: Diagnosis not present

## 2017-11-13 DIAGNOSIS — N281 Cyst of kidney, acquired: Secondary | ICD-10-CM | POA: Diagnosis not present

## 2017-11-13 DIAGNOSIS — I10 Essential (primary) hypertension: Secondary | ICD-10-CM | POA: Diagnosis not present

## 2017-11-13 DIAGNOSIS — K567 Ileus, unspecified: Secondary | ICD-10-CM | POA: Diagnosis not present

## 2017-11-13 DIAGNOSIS — C772 Secondary and unspecified malignant neoplasm of intra-abdominal lymph nodes: Secondary | ICD-10-CM | POA: Diagnosis not present

## 2017-11-13 DIAGNOSIS — R197 Diarrhea, unspecified: Secondary | ICD-10-CM | POA: Diagnosis not present

## 2017-11-13 DIAGNOSIS — D62 Acute posthemorrhagic anemia: Secondary | ICD-10-CM | POA: Diagnosis not present

## 2017-11-13 DIAGNOSIS — Z7952 Long term (current) use of systemic steroids: Secondary | ICD-10-CM | POA: Diagnosis not present

## 2017-11-13 DIAGNOSIS — K660 Peritoneal adhesions (postprocedural) (postinfection): Secondary | ICD-10-CM | POA: Diagnosis present

## 2017-11-13 DIAGNOSIS — K760 Fatty (change of) liver, not elsewhere classified: Secondary | ICD-10-CM | POA: Diagnosis not present

## 2017-11-13 DIAGNOSIS — Z9049 Acquired absence of other specified parts of digestive tract: Secondary | ICD-10-CM

## 2017-11-13 DIAGNOSIS — C785 Secondary malignant neoplasm of large intestine and rectum: Secondary | ICD-10-CM | POA: Diagnosis not present

## 2017-11-13 DIAGNOSIS — E119 Type 2 diabetes mellitus without complications: Secondary | ICD-10-CM | POA: Diagnosis not present

## 2017-11-13 DIAGNOSIS — C7982 Secondary malignant neoplasm of genital organs: Secondary | ICD-10-CM | POA: Diagnosis not present

## 2017-11-13 DIAGNOSIS — Z79899 Other long term (current) drug therapy: Secondary | ICD-10-CM

## 2017-11-13 DIAGNOSIS — C561 Malignant neoplasm of right ovary: Secondary | ICD-10-CM

## 2017-11-13 DIAGNOSIS — C562 Malignant neoplasm of left ovary: Secondary | ICD-10-CM | POA: Diagnosis not present

## 2017-11-13 DIAGNOSIS — C7919 Secondary malignant neoplasm of other urinary organs: Secondary | ICD-10-CM | POA: Diagnosis not present

## 2017-11-13 DIAGNOSIS — K6389 Other specified diseases of intestine: Secondary | ICD-10-CM | POA: Diagnosis not present

## 2017-11-13 DIAGNOSIS — M8580 Other specified disorders of bone density and structure, unspecified site: Secondary | ICD-10-CM | POA: Diagnosis present

## 2017-11-13 HISTORY — PX: LAPAROTOMY: SHX154

## 2017-11-13 HISTORY — PX: DEBULKING: SHX6277

## 2017-11-13 HISTORY — PX: SALPINGOOPHORECTOMY: SHX82

## 2017-11-13 LAB — GLUCOSE, CAPILLARY
GLUCOSE-CAPILLARY: 116 mg/dL — AB (ref 70–99)
GLUCOSE-CAPILLARY: 177 mg/dL — AB (ref 70–99)
GLUCOSE-CAPILLARY: 209 mg/dL — AB (ref 70–99)
Glucose-Capillary: 182 mg/dL — ABNORMAL HIGH (ref 70–99)
Glucose-Capillary: 228 mg/dL — ABNORMAL HIGH (ref 70–99)

## 2017-11-13 SURGERY — LAPAROTOMY, EXPLORATORY
Anesthesia: General

## 2017-11-13 MED ORDER — EPHEDRINE SULFATE 50 MG/ML IJ SOLN
INTRAMUSCULAR | Status: DC | PRN
  Administered 2017-11-13: 5 mg via INTRAVENOUS
  Administered 2017-11-13: 10 mg via INTRAVENOUS

## 2017-11-13 MED ORDER — PROPOFOL 10 MG/ML IV BOLUS
INTRAVENOUS | Status: AC
  Filled 2017-11-13: qty 20

## 2017-11-13 MED ORDER — CHEWING GUM (ORBIT) SUGAR FREE
1.0000 | CHEWING_GUM | Freq: Three times a day (TID) | ORAL | Status: DC
  Administered 2017-11-14 – 2017-11-18 (×9): 1 via ORAL
  Filled 2017-11-13 (×2): qty 1

## 2017-11-13 MED ORDER — SUGAMMADEX SODIUM 500 MG/5ML IV SOLN
INTRAVENOUS | Status: AC
  Filled 2017-11-13: qty 5

## 2017-11-13 MED ORDER — MIDAZOLAM HCL 2 MG/2ML IJ SOLN
INTRAMUSCULAR | Status: AC
  Filled 2017-11-13: qty 2

## 2017-11-13 MED ORDER — ACETAMINOPHEN 10 MG/ML IV SOLN: 1000.0000 mg | Freq: Once | INTRAVENOUS | Status: DC | PRN

## 2017-11-13 MED ORDER — MIDAZOLAM HCL 5 MG/5ML IJ SOLN
INTRAMUSCULAR | Status: DC | PRN
  Administered 2017-11-13 (×2): 1 mg via INTRAVENOUS

## 2017-11-13 MED ORDER — ATENOLOL 25 MG PO TABS
25.0000 mg | ORAL_TABLET | Freq: Every day | ORAL | Status: DC
  Administered 2017-11-13 – 2017-11-17 (×5): 25 mg via ORAL
  Filled 2017-11-13 (×5): qty 1

## 2017-11-13 MED ORDER — DIPHENHYDRAMINE HCL 50 MG/ML IJ SOLN
INTRAMUSCULAR | Status: DC | PRN
  Administered 2017-11-13: 12.5 mg via INTRAVENOUS

## 2017-11-13 MED ORDER — EPHEDRINE 5 MG/ML INJ
INTRAVENOUS | Status: AC
  Filled 2017-11-13: qty 10

## 2017-11-13 MED ORDER — FENTANYL CITRATE (PF) 250 MCG/5ML IJ SOLN
INTRAMUSCULAR | Status: AC
  Filled 2017-11-13: qty 5

## 2017-11-13 MED ORDER — SODIUM CHLORIDE 0.9 % IV SOLN
2.0000 g | INTRAVENOUS | Status: AC
  Administered 2017-11-13 (×2): 2 g via INTRAVENOUS
  Filled 2017-11-13: qty 2

## 2017-11-13 MED ORDER — OXYCODONE HCL 5 MG PO TABS
5.0000 mg | ORAL_TABLET | ORAL | Status: DC | PRN
  Administered 2017-11-13 – 2017-11-17 (×10): 5 mg via ORAL
  Filled 2017-11-13 (×10): qty 1

## 2017-11-13 MED ORDER — BUPIVACAINE LIPOSOME 1.3 % IJ SUSP
20.0000 mL | Freq: Once | INTRAMUSCULAR | Status: AC
  Administered 2017-11-13: 20 mL
  Filled 2017-11-13: qty 20

## 2017-11-13 MED ORDER — ROCURONIUM BROMIDE 10 MG/ML (PF) SYRINGE
PREFILLED_SYRINGE | INTRAVENOUS | Status: AC
  Filled 2017-11-13: qty 10

## 2017-11-13 MED ORDER — SODIUM CHLORIDE 0.9 % IV SOLN
2.0000 g | INTRAVENOUS | Status: AC
  Administered 2017-11-13: 2 g via INTRAVENOUS
  Filled 2017-11-13: qty 2

## 2017-11-13 MED ORDER — HYDROMORPHONE HCL 1 MG/ML IJ SOLN
0.2500 mg | INTRAMUSCULAR | Status: DC | PRN
  Administered 2017-11-13 (×2): 0.5 mg via INTRAVENOUS

## 2017-11-13 MED ORDER — DEXAMETHASONE SODIUM PHOSPHATE 10 MG/ML IJ SOLN
INTRAMUSCULAR | Status: AC
  Filled 2017-11-13: qty 1

## 2017-11-13 MED ORDER — MEPERIDINE HCL 50 MG/ML IJ SOLN: 6.2500 mg | INTRAMUSCULAR | Status: DC | PRN

## 2017-11-13 MED ORDER — BUPIVACAINE HCL (PF) 0.25 % IJ SOLN
INTRAMUSCULAR | Status: AC
  Filled 2017-11-13: qty 30

## 2017-11-13 MED ORDER — KCL IN DEXTROSE-NACL 20-5-0.45 MEQ/L-%-% IV SOLN
INTRAVENOUS | Status: DC
  Administered 2017-11-13: 15:00:00 via INTRAVENOUS
  Filled 2017-11-13: qty 1000

## 2017-11-13 MED ORDER — PREGABALIN 25 MG PO CAPS
25.0000 mg | ORAL_CAPSULE | Freq: Two times a day (BID) | ORAL | Status: DC
  Administered 2017-11-14 – 2017-11-18 (×8): 25 mg via ORAL
  Filled 2017-11-13 (×9): qty 1

## 2017-11-13 MED ORDER — HYDROMORPHONE HCL 1 MG/ML IJ SOLN
INTRAMUSCULAR | Status: AC
  Administered 2017-11-13: 0.5 mg via INTRAVENOUS
  Filled 2017-11-13: qty 1

## 2017-11-13 MED ORDER — SODIUM CHLORIDE 0.9 % IJ SOLN
INTRAMUSCULAR | Status: DC | PRN
  Administered 2017-11-13: 10 mL

## 2017-11-13 MED ORDER — IBUPROFEN 200 MG PO TABS: 600.0000 mg | ORAL_TABLET | Freq: Four times a day (QID) | ORAL | Status: DC

## 2017-11-13 MED ORDER — ONDANSETRON HCL 4 MG/2ML IJ SOLN: 4.0000 mg | Freq: Once | INTRAMUSCULAR | Status: DC | PRN

## 2017-11-13 MED ORDER — FENTANYL CITRATE (PF) 100 MCG/2ML IJ SOLN
INTRAMUSCULAR | Status: DC | PRN
  Administered 2017-11-13 (×4): 50 ug via INTRAVENOUS
  Administered 2017-11-13: 100 ug via INTRAVENOUS
  Administered 2017-11-13: 50 ug via INTRAVENOUS

## 2017-11-13 MED ORDER — KETAMINE HCL 10 MG/ML IJ SOLN
INTRAMUSCULAR | Status: AC
  Filled 2017-11-13: qty 1

## 2017-11-13 MED ORDER — BUPIVACAINE HCL 0.25 % IJ SOLN
INTRAMUSCULAR | Status: DC | PRN
  Administered 2017-11-13: 30 mL

## 2017-11-13 MED ORDER — GLUCERNA SHAKE PO LIQD
237.0000 mL | Freq: Three times a day (TID) | ORAL | Status: DC
  Administered 2017-11-14 – 2017-11-17 (×3): 237 mL via ORAL
  Filled 2017-11-13 (×14): qty 237

## 2017-11-13 MED ORDER — PHENYLEPHRINE 40 MCG/ML (10ML) SYRINGE FOR IV PUSH (FOR BLOOD PRESSURE SUPPORT)
PREFILLED_SYRINGE | INTRAVENOUS | Status: DC | PRN
  Administered 2017-11-13 (×2): 80 ug via INTRAVENOUS
  Administered 2017-11-13: 40 ug via INTRAVENOUS
  Administered 2017-11-13: 120 ug via INTRAVENOUS
  Administered 2017-11-13: 40 ug via INTRAVENOUS
  Administered 2017-11-13: 80 ug via INTRAVENOUS
  Administered 2017-11-13: 40 ug via INTRAVENOUS
  Administered 2017-11-13 (×2): 80 ug via INTRAVENOUS

## 2017-11-13 MED ORDER — NON FORMULARY: 1.0000 [IU] | Freq: Three times a day (TID) | Status: DC

## 2017-11-13 MED ORDER — INSULIN ASPART 100 UNIT/ML ~~LOC~~ SOLN: 0.0000 [IU] | Freq: Three times a day (TID) | SUBCUTANEOUS | Status: DC

## 2017-11-13 MED ORDER — LIDOCAINE 2% (20 MG/ML) 5 ML SYRINGE
INTRAMUSCULAR | Status: AC
  Filled 2017-11-13: qty 5

## 2017-11-13 MED ORDER — ENSURE ENLIVE PO LIQD
237.0000 mL | Freq: Two times a day (BID) | ORAL | Status: DC
  Administered 2017-11-13: 237 mL via ORAL

## 2017-11-13 MED ORDER — ROCURONIUM BROMIDE 50 MG/5ML IV SOSY
PREFILLED_SYRINGE | INTRAVENOUS | Status: DC | PRN
  Administered 2017-11-13 (×2): 20 mg via INTRAVENOUS
  Administered 2017-11-13: 10 mg via INTRAVENOUS
  Administered 2017-11-13: 50 mg via INTRAVENOUS
  Administered 2017-11-13: 10 mg via INTRAVENOUS

## 2017-11-13 MED ORDER — ENOXAPARIN SODIUM 40 MG/0.4ML ~~LOC~~ SOLN
40.0000 mg | SUBCUTANEOUS | Status: AC
  Administered 2017-11-13: 40 mg via SUBCUTANEOUS
  Filled 2017-11-13: qty 0.4

## 2017-11-13 MED ORDER — INSULIN ASPART 100 UNIT/ML ~~LOC~~ SOLN
0.0000 [IU] | Freq: Three times a day (TID) | SUBCUTANEOUS | Status: DC
  Administered 2017-11-13: 5 [IU] via SUBCUTANEOUS
  Administered 2017-11-14 (×2): 2 [IU] via SUBCUTANEOUS
  Administered 2017-11-14: 5 [IU] via SUBCUTANEOUS
  Administered 2017-11-15: 3 [IU] via SUBCUTANEOUS
  Administered 2017-11-15: 2 [IU] via SUBCUTANEOUS
  Administered 2017-11-15: 3 [IU] via SUBCUTANEOUS
  Administered 2017-11-16 – 2017-11-17 (×3): 2 [IU] via SUBCUTANEOUS

## 2017-11-13 MED ORDER — DIPHENHYDRAMINE HCL 50 MG/ML IJ SOLN
INTRAMUSCULAR | Status: AC
  Filled 2017-11-13: qty 1

## 2017-11-13 MED ORDER — SCOPOLAMINE 1 MG/3DAYS TD PT72
1.0000 | MEDICATED_PATCH | TRANSDERMAL | Status: DC
  Administered 2017-11-13: 1.5 mg via TRANSDERMAL
  Filled 2017-11-13: qty 1

## 2017-11-13 MED ORDER — ONDANSETRON HCL 4 MG PO TABS: 4.0000 mg | ORAL_TABLET | Freq: Four times a day (QID) | ORAL | Status: DC | PRN

## 2017-11-13 MED ORDER — DEXAMETHASONE SODIUM PHOSPHATE 4 MG/ML IJ SOLN
4.0000 mg | INTRAMUSCULAR | Status: AC
  Administered 2017-11-13: 10 mg via INTRAVENOUS

## 2017-11-13 MED ORDER — ACETAMINOPHEN 500 MG PO TABS
1000.0000 mg | ORAL_TABLET | ORAL | Status: AC
  Administered 2017-11-13: 1000 mg via ORAL
  Filled 2017-11-13: qty 2

## 2017-11-13 MED ORDER — LIDOCAINE HCL 2 % IJ SOLN
INTRAMUSCULAR | Status: AC
  Filled 2017-11-13: qty 20

## 2017-11-13 MED ORDER — ONDANSETRON HCL 4 MG/2ML IJ SOLN
INTRAMUSCULAR | Status: DC | PRN
  Administered 2017-11-13: 4 mg via INTRAVENOUS

## 2017-11-13 MED ORDER — PHENYLEPHRINE HCL 10 MG/ML IJ SOLN
INTRAMUSCULAR | Status: AC
  Filled 2017-11-13: qty 1

## 2017-11-13 MED ORDER — HYDROMORPHONE HCL 1 MG/ML IJ SOLN
0.5000 mg | INTRAMUSCULAR | Status: AC | PRN
  Administered 2017-11-13 – 2017-11-14 (×2): 0.5 mg via INTRAVENOUS
  Filled 2017-11-13 (×2): qty 0.5

## 2017-11-13 MED ORDER — ALBUMIN HUMAN 5 % IV SOLN
INTRAVENOUS | Status: DC | PRN
  Administered 2017-11-13: 12:00:00 via INTRAVENOUS

## 2017-11-13 MED ORDER — PHENYLEPHRINE 40 MCG/ML (10ML) SYRINGE FOR IV PUSH (FOR BLOOD PRESSURE SUPPORT)
PREFILLED_SYRINGE | INTRAVENOUS | Status: AC
  Filled 2017-11-13: qty 10

## 2017-11-13 MED ORDER — SUGAMMADEX SODIUM 500 MG/5ML IV SOLN
INTRAVENOUS | Status: DC | PRN
  Administered 2017-11-13: 300 mg via INTRAVENOUS

## 2017-11-13 MED ORDER — SODIUM CHLORIDE 0.9 % IJ SOLN
INTRAMUSCULAR | Status: AC
  Filled 2017-11-13: qty 20

## 2017-11-13 MED ORDER — LIDOCAINE 2% (20 MG/ML) 5 ML SYRINGE
INTRAMUSCULAR | Status: DC | PRN
  Administered 2017-11-13: 80 mg via INTRAVENOUS

## 2017-11-13 MED ORDER — SODIUM CHLORIDE 0.9 % IV SOLN
INTRAVENOUS | Status: DC | PRN
  Administered 2017-11-13: 50 ug/min via INTRAVENOUS

## 2017-11-13 MED ORDER — ALBUMIN HUMAN 5 % IV SOLN
INTRAVENOUS | Status: AC
  Filled 2017-11-13: qty 250

## 2017-11-13 MED ORDER — SODIUM CHLORIDE 0.9 % IV SOLN
2.0000 g | Freq: Once | INTRAVENOUS | Status: DC
  Filled 2017-11-13: qty 2

## 2017-11-13 MED ORDER — 0.9 % SODIUM CHLORIDE (POUR BTL) OPTIME
TOPICAL | Status: DC | PRN
  Administered 2017-11-13: 2000 mL

## 2017-11-13 MED ORDER — ONDANSETRON HCL 4 MG/2ML IJ SOLN
INTRAMUSCULAR | Status: AC
  Filled 2017-11-13: qty 2

## 2017-11-13 MED ORDER — ENOXAPARIN SODIUM 40 MG/0.4ML ~~LOC~~ SOLN
40.0000 mg | SUBCUTANEOUS | Status: DC
  Administered 2017-11-14 – 2017-11-18 (×5): 40 mg via SUBCUTANEOUS
  Filled 2017-11-13 (×5): qty 0.4

## 2017-11-13 MED ORDER — FAMOTIDINE 20 MG PO TABS
20.0000 mg | ORAL_TABLET | ORAL | Status: DC | PRN
  Administered 2017-11-16: 20 mg via ORAL
  Filled 2017-11-13: qty 1

## 2017-11-13 MED ORDER — STERILE WATER FOR IRRIGATION IR SOLN
Status: DC | PRN
  Administered 2017-11-13: 3000 mL

## 2017-11-13 MED ORDER — HYDROCODONE-ACETAMINOPHEN 7.5-325 MG PO TABS: 1.0000 | ORAL_TABLET | Freq: Once | ORAL | Status: DC | PRN

## 2017-11-13 MED ORDER — LACTATED RINGERS IV SOLN
INTRAVENOUS | Status: DC
  Administered 2017-11-13 (×4): via INTRAVENOUS

## 2017-11-13 MED ORDER — PROPOFOL 10 MG/ML IV BOLUS
INTRAVENOUS | Status: DC | PRN
  Administered 2017-11-13: 120 mg via INTRAVENOUS
  Administered 2017-11-13: 50 mg via INTRAVENOUS

## 2017-11-13 MED ORDER — KETAMINE HCL 10 MG/ML IJ SOLN
INTRAMUSCULAR | Status: DC | PRN
  Administered 2017-11-13: 10 mg via INTRAVENOUS
  Administered 2017-11-13: 5 mg via INTRAVENOUS
  Administered 2017-11-13: 30 mg via INTRAVENOUS

## 2017-11-13 MED ORDER — ACETAMINOPHEN 500 MG PO TABS: 1000.0000 mg | ORAL_TABLET | Freq: Two times a day (BID) | ORAL | Status: DC

## 2017-11-13 MED ORDER — LIDOCAINE 2% (20 MG/ML) 5 ML SYRINGE
INTRAMUSCULAR | Status: DC | PRN
  Administered 2017-11-13: 1.5 mg/kg/h via INTRAVENOUS

## 2017-11-13 MED ORDER — GABAPENTIN 300 MG PO CAPS
300.0000 mg | ORAL_CAPSULE | ORAL | Status: AC
  Administered 2017-11-13: 300 mg via ORAL
  Filled 2017-11-13: qty 1

## 2017-11-13 MED ORDER — DOCUSATE SODIUM 100 MG PO CAPS
100.0000 mg | ORAL_CAPSULE | Freq: Two times a day (BID) | ORAL | Status: DC
  Administered 2017-11-13 – 2017-11-17 (×9): 100 mg via ORAL
  Filled 2017-11-13 (×10): qty 1

## 2017-11-13 MED ORDER — INSULIN GLARGINE 100 UNIT/ML ~~LOC~~ SOLN
10.0000 [IU] | Freq: Every day | SUBCUTANEOUS | Status: DC
  Administered 2017-11-13 – 2017-11-17 (×5): 10 [IU] via SUBCUTANEOUS
  Filled 2017-11-13 (×7): qty 0.1

## 2017-11-13 MED ORDER — TRAMADOL HCL 50 MG PO TABS
100.0000 mg | ORAL_TABLET | Freq: Two times a day (BID) | ORAL | Status: DC | PRN
  Administered 2017-11-13 – 2017-11-15 (×2): 100 mg via ORAL
  Filled 2017-11-13 (×2): qty 2

## 2017-11-13 MED ORDER — ENOXAPARIN (LOVENOX) PATIENT EDUCATION KIT
PACK | Freq: Once | Status: AC
  Administered 2017-11-14: 09:00:00
  Filled 2017-11-13: qty 1

## 2017-11-13 MED ORDER — ONDANSETRON HCL 4 MG/2ML IJ SOLN
4.0000 mg | Freq: Four times a day (QID) | INTRAMUSCULAR | Status: DC | PRN
  Administered 2017-11-13 – 2017-11-16 (×8): 4 mg via INTRAVENOUS
  Filled 2017-11-13 (×8): qty 2

## 2017-11-13 SURGICAL SUPPLY — 50 items
ADH SKN CLS APL DERMABOND .7 (GAUZE/BANDAGES/DRESSINGS) ×2
ATTRACTOMAT 16X20 MAGNETIC DRP (DRAPES) ×3 IMPLANT
BLADE EXTENDED COATED 6.5IN (ELECTRODE) ×3 IMPLANT
BRR ADH 6X5 SEPRAFILM 1 SHT (MISCELLANEOUS)
CHLORAPREP W/TINT 26ML (MISCELLANEOUS) ×3 IMPLANT
CLIP VESOCCLUDE LG 6/CT (CLIP) ×3 IMPLANT
CLIP VESOCCLUDE MED 6/CT (CLIP) ×4 IMPLANT
CONT SPEC 4OZ CLIKSEAL STRL BL (MISCELLANEOUS) ×1 IMPLANT
DERMABOND ADVANCED (GAUZE/BANDAGES/DRESSINGS) ×1
DERMABOND ADVANCED .7 DNX12 (GAUZE/BANDAGES/DRESSINGS) IMPLANT
DRAPE INCISE IOBAN 66X45 STRL (DRAPES) ×1 IMPLANT
DRAPE UNDERBUTTOCKS STRL (DRAPE) ×3 IMPLANT
DRAPE WARM FLUID 44X44 (DRAPE) ×3 IMPLANT
DRSG OPSITE POSTOP 4X12 (GAUZE/BANDAGES/DRESSINGS) ×1 IMPLANT
DRSG TEGADERM 4X4.75 (GAUZE/BANDAGES/DRESSINGS) ×1 IMPLANT
DRSG TEGADERM 6X8 (GAUZE/BANDAGES/DRESSINGS) ×1 IMPLANT
ELECT REM PT RETURN 15FT ADLT (MISCELLANEOUS) ×3 IMPLANT
FLOSEAL 10ML (HEMOSTASIS) ×1 IMPLANT
FLOSEAL 5ML (HEMOSTASIS) ×1 IMPLANT
GAUZE 4X4 16PLY RFD (DISPOSABLE) ×1 IMPLANT
GLOVE BIOGEL PI IND STRL 7.0 (GLOVE) ×4 IMPLANT
GLOVE BIOGEL PI INDICATOR 7.0 (GLOVE) ×2
GLOVE SURG SS PI 6.5 STRL IVOR (GLOVE) ×6 IMPLANT
GOWN STRL REUS W/ TWL LRG LVL3 (GOWN DISPOSABLE) ×2 IMPLANT
GOWN STRL REUS W/TWL LRG LVL3 (GOWN DISPOSABLE) ×6 IMPLANT
HANDLE SUCTION POOLE (INSTRUMENTS) IMPLANT
KIT BASIN OR (CUSTOM PROCEDURE TRAY) ×3 IMPLANT
LIGASURE IMPACT 36 18CM CVD LR (INSTRUMENTS) ×3 IMPLANT
LOOP VESSEL MAXI BLUE (MISCELLANEOUS) ×1 IMPLANT
NEEDLE HYPO 22GX1.5 SAFETY (NEEDLE) ×6 IMPLANT
PACK GENERAL/GYN (CUSTOM PROCEDURE TRAY) ×3 IMPLANT
RETAINER VISCERA MED (MISCELLANEOUS) IMPLANT
SEPRAFILM MEMBRANE 5X6 (MISCELLANEOUS) IMPLANT
SHEET LAVH (DRAPES) ×3 IMPLANT
SPONGE LAP 18X18 RF (DISPOSABLE) ×6 IMPLANT
SUCTION POOLE HANDLE (INSTRUMENTS) ×3
SUT MNCRL AB 4-0 PS2 18 (SUTURE) ×10 IMPLANT
SUT PDS AB 1 TP1 96 (SUTURE) ×6 IMPLANT
SUT PROLENE 3 0 SH 48 (SUTURE) ×1 IMPLANT
SUT SILK 3 0 SH 30 (SUTURE) ×1 IMPLANT
SUT VIC AB 0 CT1 18XCR BRD 8 (SUTURE) ×2 IMPLANT
SUT VIC AB 0 CT1 8-18 (SUTURE) ×3
SUT VIC AB 4-0 PS2 18 (SUTURE) ×4 IMPLANT
SUT VICRYL 0 TIES 12 18 (SUTURE) ×3 IMPLANT
SUT VICRYL 4-0 PS2 18IN ABS (SUTURE) ×2 IMPLANT
SYR 30ML LL (SYRINGE) ×6 IMPLANT
SYR 50ML LL SCALE MARK (SYRINGE) ×1 IMPLANT
TOWEL OR 17X26 10 PK STRL BLUE (TOWEL DISPOSABLE) ×3 IMPLANT
TOWEL OR NON WOVEN STRL DISP B (DISPOSABLE) ×3 IMPLANT
TRAY FOLEY MTR SLVR 14FR STAT (SET/KITS/TRAYS/PACK) ×3 IMPLANT

## 2017-11-13 NOTE — Anesthesia Procedure Notes (Addendum)
Procedure Name: Intubation Date/Time: 11/13/2017 8:38 AM Performed by: Deliah Boston, CRNA Pre-anesthesia Checklist: Patient identified, Emergency Drugs available, Suction available and Patient being monitored Patient Re-evaluated:Patient Re-evaluated prior to induction Oxygen Delivery Method: Circle system utilized Preoxygenation: Pre-oxygenation with 100% oxygen Induction Type: IV induction Ventilation: Mask ventilation without difficulty Laryngoscope Size: Mac and 3 Grade View: Grade I Tube type: Oral Tube size: 7.0 mm Number of attempts: 1 Airway Equipment and Method: Stylet and Oral airway Placement Confirmation: ETT inserted through vocal cords under direct vision,  positive ETCO2 and breath sounds checked- equal and bilateral Secured at: 21 cm Tube secured with: Tape Dental Injury: Teeth and Oropharynx as per pre-operative assessment and Injury to lip  Comments: Intubation by [employer]. Small nick to right upper lip. Cold compress applied

## 2017-11-13 NOTE — Transfer of Care (Signed)
Immediate Anesthesia Transfer of Care Note  Patient: Sabrina Vang  Procedure(s) Performed: Procedure(s): EXPLORATORY LAPAROTOMY (N/A) BILATERAL SALPINGO OOPHORECTOMY (Bilateral) DEBULKING (N/A)  Patient Location: PACU  Anesthesia Type:General  Level of Consciousness: Patient easily awoken, sedated, comfortable, cooperative, following commands, responds to stimulation.   Airway & Oxygen Therapy: Patient spontaneously breathing, ventilating well, oxygen via simple oxygen mask.  Post-op Assessment: Report given to PACU RN, vital signs reviewed and stable, moving all extremities.   Post vital signs: Reviewed and stable.  Complications: No apparent anesthesia complications  Last Vitals:  Vitals Value Taken Time  BP 126/66 11/13/2017  1:52 PM  Temp    Pulse 102 11/13/2017  1:55 PM  Resp 19 11/13/2017  1:55 PM  SpO2 100 % 11/13/2017  1:55 PM  Vitals shown include unvalidated device data.  Last Pain:  Vitals:   11/13/17 1352  TempSrc:   PainSc: (P) 0-No pain      Patients Stated Pain Goal: 4 (08/81/10 3159)  Complications: No apparent anesthesia complications

## 2017-11-13 NOTE — Op Note (Signed)
OPERATIVE NOTE  Date: 11/13/17  Preoperative Diagnosis: Ovarian cancer s/p neoadjuvant chemotherapy   Postoperative Diagnosis:  Same  Procedure(s) Performed:   1. Exploratory laparotomy  2. Bilateral salpingo-oophorectomy with radical tumor debulking for ovarian cancer   including retroperitoneal dissection, lysis of adhesions (enterolysis of small bowel in deep pelvis, left adnexal adhesions to sigmoid and omentum to LLQ) ~1 hour, ureterolysis. 3. Omentectomy  4. Takedown of splenic flexure with removal of omental tumor in left upper quadrant  Surgeon: Bernita Raisin, MD  Assistant Surgeon:  Lahoma Crocker M.D. (an MD assistant was necessary for tissue manipulation, management of instrumentation, retraction and positioning due to the complexity of the case and hospital policies).   Anesthesia: GETA  Specimens: Bilateral tubes and ovaries, omentum, splenic flexure nodule, right paracolic gutter nodule, cecal nodule, right ureteral peritoneal nodule, small bowel mesentary.  Complications: None  Indication for Procedure:  Patient is s/p 3 cycles of chemotherapy with response based on CA125 and decreased mediastinal disease (to <1cm).  Operative Findings:  This represented an optimal cytoreduction with gross residual disease remaining in the deep pelvis near the levator floor and possibly in the region of the right IP ligament/periappendicial region.  Upon entry a large ~20cm right tube/ovary, mostly cystic. Adherent rind along ileocecal region and appendix. Large cystic left tube/ovary ~10cm with sigmoid colon stretched across the mass and extension of the cystic lesion into the deep pelvis with residual palpable disease deep pelvis near levators. Estimate ~1cm or less on palpation, not visible disease. Omental caking in the LLQ adherent to pelvic sidewall. Omental disease noted RUQ separate. In addition ~1.5cm lesion in splenic flexure omentum. Evidence of prior diaphragmatic  disease, no visible disease.  Procedure in Detail:  The patient was seen in the Holding and consent signed. She was then taken to the operating room.  After induction of anesthesia, the patient was draped and prepped in dorsal lithotomy position in padded Allen stirrups. Time out was performed. A Foley catheter was placed to gravity and a sponge on a stick was placed in the vagina.   Attention was turned to the abdomen. A midline vertical incision was made and carried through the subcutaneous tissue to the fascia. The fascial incision was made and extended superiorally. The rectus muscles were separated. The peritoneum was identified and entered. Peritoneal incision was extended longitudinally.  The abdominal cavity was entered sharply and without incident. The first thing noted was the right ovarian mass. A survey of the abdomen and pelvis revealed the above findings. Retractors were placed to try to manipulate the right ovarian mass but there was adhesion to the cecum and the small bowel was adherent deep into the pelvis. The mass was not mobile. Therefore a Bookwalter retractor was placed.  The small bowel was freed from its pelvic adhesions. The cecum was noted adherent to the inferior-lateral portion of the right adnexal mass. The right retroperitoneal space was opened by dissecting along above the psoas and then up along the gutter. The greater vessels were identified and then the ureter. At this point the presumed IP ligament above was isolated, clamped, ligated, and transected. We then followed the broad ligament down and then isolated, clamped, and ligated the uteroovarian ligament on that right side. I decided at this time that drainage of the mass would be needed to see the inferior-lateral surface and the cecal adhesions/ureter. Once done there was enough mobilization to lift the mass out of the pelvis and expose the adhesion to the cecum.  The cecum appeared folded back on itself. Once the  planes were more evident I was able to resect the mass off of the cecum and appendix. There was some tumor rind on the cecum that we further resected later in the case.   Next the left adnexa was examined. A large amount of omental disease was adherent to this left pelvic sidewall and was taken down off of the peritoneum. The left adnexa revealed a cystic mass that was about 10cm and in the retroperitoneum. The sigmoid colon was draped over the mass. We elevated the left retroperitoneum and entered the left retroperitoneal space. The left ureter was identified and the IP ligament was isolated, clamped, transected, and ligated. The broad ligament was then followed inferiorly, opening the spaces around the mass as we went. The ureter was well visualized and at one point placed on a vessel loop. I did dissect the ureter down a bit off of the broad ligament. The mass was entered inadvertently while taking down some of the adhesions. I was able to palpate through the cyst wall the remaining structural boundaries of the mass. These margins were freed laterally and medially. Inferiorly, however the cystic lesion had extended deep into the pelvis, presumably due to growing in the confined retroperitoneal space. We removed as much of the tumor/cyst wall as we could. Deep to the area resected and down to the levators the cyst walls continued and at the most distal portion there was a firm area which may be residual disease. This felt to be ~1cm but was so deep it was only palpable, not visible. This would have required a very low anterior resection and colostomy.  At this time the omental cake was dissected free from the transverse colon from the hepatic flexure to the splenic flexure using cautery and the Ligasure. The lesser sac was entered. This was taken in 2 sections as there was tumor from the LLQ dissection and then also tumor in the RUQ portion of the omentum. The RUQ resection was separated first. The remaining  omentum was freed from the transverse colon. The lesser sac was entered. The short gastric vessels were sealed with ligasure and the infragastric omentum was separated from the greater curvature of the stomach. There was a residual omental lesion in the splenic flexure omentum. Therefore splenic flexure takedown was performed. This allowed me to retract and approach the omental tumor in this region in a retrograde fashion. Once I was reassured the lesion was not adherent to the transverse colon I was able to resect it with the Ligasure. Hemostasis was confirmed. The colon was closely inspected and was noted to be intact and hemostatic.  Returned to the right pelvic brim where tumor rind was present near the right IP/cecum/appendix. The cecum and appendix were separated from the inferior portion of this indurated tissue and then the rind/induration resected while reassuring the ureter was away from this dissection plane. There was some continued induration versus rind across the cecum/appendiceal region that would have required an ileocecal resection to remove completely. Given the residual disease in the deep left pelvis I was satisfied with the residual tissue in the right IP pedicle region which was ~1cm*3 volume.  The bowel was run. There was ~1cm tumor taken off the small bowel mesentary. The diaphragm with palpable rough tissue showing evidence of prior disease, but no visible nodules for resection. There was ~3-54mm (~5 spots) of disease subhepatic on the transverse colon mesentary. No left diaphragmatic disease.  The  peritoneal cavity was irrigated. Floseal was placed in the pelvis. Hemostasis was confirmed at all surgical sites.  The fascia was reapproximated with 0 looped PDS using a total of two sutures. The subcutaneous layer was then irrigated copiously.  Exparel long acting local anesthetic was infiltrated into the subcutaneous tissues. The skin was closed with 4-0 vicryl dermal sutures and then  4-0 monocryl subcutaneous sutures. The patient tolerated the procedure well.   Sponge, lap and needle counts were correct x 2.   Disposition: PACU -Stable

## 2017-11-13 NOTE — Anesthesia Postprocedure Evaluation (Signed)
Anesthesia Post Note  Patient: Sabrina Vang  Procedure(s) Performed: EXPLORATORY LAPAROTOMY (N/A ) BILATERAL SALPINGO OOPHORECTOMY (Bilateral ) DEBULKING (N/A )     Patient location during evaluation: PACU Anesthesia Type: General Level of consciousness: awake and alert Pain management: pain level controlled Vital Signs Assessment: post-procedure vital signs reviewed and stable Respiratory status: spontaneous breathing, nonlabored ventilation, respiratory function stable and patient connected to nasal cannula oxygen Cardiovascular status: blood pressure returned to baseline and stable Postop Assessment: no apparent nausea or vomiting Anesthetic complications: no    Last Vitals:  Vitals:   11/13/17 1525 11/13/17 1626  BP: 116/72 119/82  Pulse: (!) 102 (!) 109  Resp: 11 11  Temp: 36.9 C 36.4 C  SpO2: 100% 99%    Last Pain:  Vitals:   11/13/17 1626  TempSrc: Oral  PainSc:                  Barnet Glasgow

## 2017-11-13 NOTE — Interval H&P Note (Signed)
History and Physical Interval Note:  11/13/2017 7:16 AM  Sabrina Vang  has presented today for surgery, with the diagnosis of ovarian cancer  The various methods of treatment have been discussed with the patient and family. After consideration of risks, benefits and other options for treatment, the patient has consented to  Procedure(s): EXPLORATORY LAPAROTOMY (N/A) BILATERAL SALPINGO OOPHORECTOMY (Bilateral) DEBULKING (N/A) as a surgical intervention .  The patient's history has been reviewed, patient examined, no change in status, stable for surgery.  I have reviewed the patient's chart and labs.  Questions were answered to the patient's satisfaction.     Isabel Caprice

## 2017-11-14 ENCOUNTER — Encounter (HOSPITAL_COMMUNITY): Payer: Self-pay | Admitting: Obstetrics

## 2017-11-14 ENCOUNTER — Inpatient Hospital Stay (HOSPITAL_COMMUNITY): Payer: Medicare HMO

## 2017-11-14 ENCOUNTER — Other Ambulatory Visit: Payer: Self-pay

## 2017-11-14 LAB — BASIC METABOLIC PANEL
Anion gap: 7 (ref 5–15)
BUN: 17 mg/dL (ref 8–23)
CHLORIDE: 103 mmol/L (ref 98–111)
CO2: 25 mmol/L (ref 22–32)
Calcium: 8.5 mg/dL — ABNORMAL LOW (ref 8.9–10.3)
Creatinine, Ser: 0.9 mg/dL (ref 0.44–1.00)
GFR calc non Af Amer: >60 mL/min (ref >60)
Glucose, Bld: 183 mg/dL — ABNORMAL HIGH (ref 70–99)
POTASSIUM: 4.7 mmol/L (ref 3.5–5.1)
SODIUM: 135 mmol/L (ref 135–145)

## 2017-11-14 LAB — URINE CULTURE: Culture: NO GROWTH

## 2017-11-14 LAB — CBC
HEMATOCRIT: 27.8 % — AB (ref 36.0–46.0)
HEMOGLOBIN: 9.2 g/dL — AB (ref 12.0–15.0)
MCH: 31 pg (ref 26.0–34.0)
MCHC: 33.1 g/dL (ref 30.0–36.0)
MCV: 93.6 fL (ref 78.0–100.0)
Platelets: 226 10*3/uL (ref 150–400)
RBC: 2.97 MIL/uL — AB (ref 3.87–5.11)
RDW: 17.9 % — ABNORMAL HIGH (ref 11.5–15.5)
WBC: 15.2 10*3/uL — ABNORMAL HIGH (ref 4.0–10.5)

## 2017-11-14 LAB — GLUCOSE, CAPILLARY
GLUCOSE-CAPILLARY: 178 mg/dL — AB (ref 70–99)
Glucose-Capillary: 143 mg/dL — ABNORMAL HIGH (ref 70–99)
Glucose-Capillary: 134 mg/dL — ABNORMAL HIGH (ref 70–99)
Glucose-Capillary: 144 mg/dL — ABNORMAL HIGH (ref 70–99)

## 2017-11-14 MED ORDER — IOPAMIDOL (ISOVUE-370) INJECTION 76%
INTRAVENOUS | Status: AC
  Filled 2017-11-14: qty 100

## 2017-11-14 MED ORDER — IOPAMIDOL (ISOVUE-370) INJECTION 76%
100.0000 mL | Freq: Once | INTRAVENOUS | Status: AC | PRN
  Administered 2017-11-14: 100 mL via INTRAVENOUS

## 2017-11-14 MED ORDER — SODIUM CHLORIDE 0.9 % IV SOLN
INTRAVENOUS | Status: DC
  Administered 2017-11-14 – 2017-11-15 (×2): via INTRAVENOUS
  Administered 2017-11-17: 30 mL/h via INTRAVENOUS

## 2017-11-14 MED ORDER — SODIUM CHLORIDE 0.9 % IV BOLUS
500.0000 mL | Freq: Once | INTRAVENOUS | Status: AC
  Administered 2017-11-14: 500 mL via INTRAVENOUS

## 2017-11-14 MED ORDER — IBUPROFEN 200 MG PO TABS
600.0000 mg | ORAL_TABLET | Freq: Four times a day (QID) | ORAL | Status: DC | PRN
  Administered 2017-11-17 – 2017-11-18 (×4): 600 mg via ORAL
  Filled 2017-11-14 (×4): qty 3

## 2017-11-14 MED ORDER — SODIUM CHLORIDE 0.9% FLUSH
10.0000 mL | INTRAVENOUS | Status: DC | PRN
  Administered 2017-11-18: 10 mL
  Filled 2017-11-14: qty 40

## 2017-11-14 MED ORDER — ACETAMINOPHEN 500 MG PO TABS
1000.0000 mg | ORAL_TABLET | Freq: Three times a day (TID) | ORAL | Status: DC
  Administered 2017-11-14 – 2017-11-18 (×9): 1000 mg via ORAL
  Filled 2017-11-14 (×11): qty 2

## 2017-11-14 NOTE — Progress Notes (Signed)
1 Day Post-Op Procedure(s) (LRB): EXPLORATORY LAPAROTOMY (N/A) BILATERAL SALPINGO OOPHORECTOMY (Bilateral) DEBULKING (N/A)  Subjective: Patient reports mild nausea with no emesis.  Reporting abdominal pain at a 7 on 10 scale at this time but states she took an oxycodone tablet 15 minutes ago.  She dangled and stood at the side of the bed last pm with mild dizziness reported.  Ambulated to the bathroom this am with assist with dizziness reported at that time as well.  Requesting a rolling walker with a seat. Due to void since foley removal.  Denies chest pain, dyspnea, passing flatus.  Belching frequently.  Husband at the bedside.  No other concerns voiced.    Objective: Vital signs in last 24 hours: Temp:  [97.6 F (36.4 C)-99 F (37.2 C)] 99 F (37.2 C) (07/24 0527) Pulse Rate:  [98-109] 101 (07/24 0527) Resp:  [11-18] 18 (07/24 0527) BP: (113-137)/(62-82) 113/64 (07/24 0527) SpO2:  [94 %-100 %] 94 % (07/24 0527) Last BM Date: 11/13/17  Intake/Output from previous day: 07/23 0701 - 07/24 0700 In: 5464.2 [P.O.:420; I.V.:4594.2; IV Piggyback:450] Out: 3795 [Urine:625; Blood:420]  Physical Examination: General: alert, cooperative and no distress Resp: clear to auscultation bilaterally Cardio: mildly tachycardic at 101 bpm, regular rhythm, no murmurs/clicks/rubs GI: incision: op site dressing in place on midline incision with no active drainage or erythema noted underneath and abdomen soft, hypoactive bowel sounds, tender with light palpation Extremities: extremities normal, atraumatic, no cyanosis or edema  Labs: WBC/Hgb/Hct/Plts:  15.2/9.2/27.8/226 (07/24 8979) BUN/Cr/glu/ALT/AST/amyl/lip:  17/0.90/--/--/--/--/-- (07/24 1504)  Assessment: 67 y.o. s/p Procedure(s): EXPLORATORY LAPAROTOMY BILATERAL SALPINGO OOPHORECTOMY DEBULKING: stable Pain:  Pain is well-controlled on PRN medications.  Heme: Hgb 9.2 and Hct 27.8 this am. Appropriate with estimated surgical losses.   CV: BP  stable and mildly tachycardic at times.  Atenolol ordered.  Continue to monitor.  IVF change per Dr. Gerarda Fraction.  GI:  Tolerating po: Yes, increasing intake slowly.  Antiemetics ordered PRN.  Scopalamine patch in place.  GU: Due to void since foley removal.  Continue to monitor output.  No urine output recorded from 14:00-01:00.    FEN: Stable post-operatively.  Endo: Diabetes mellitus Type II, under good control..  CBG:  CBG (last 3)  Recent Labs    11/13/17 1630 11/13/17 2118 11/14/17 0812  GLUCAP 209* 228* 178*  .  Prophylaxis: intermittent pneumatic compression boots and Lovenox  Plan: IVF change to NS at 75 cc/hr Repeat H and H ordered Continue to monitor output and vitals closely Encourage ambulation with assist, IS use, deep breathing, and coughing Case Management for rolling walker at home Lovenox teaching kit to patient Continue plan of care per Dr. Gerarda Fraction   LOS: 1 day    Dorothyann Gibbs 11/14/2017, 9:41 AM

## 2017-11-14 NOTE — Progress Notes (Signed)
Spoke with Judson Roch RN to obtain repeat O2 saturation read.  93 % on RA HR 99.  Plan to reapply 2 L of O2.  Bladder scan with 47 cc.  Plan for bolus of 500 cc NS over two hours.  Dr. Gerarda Fraction aware of the above.

## 2017-11-14 NOTE — Progress Notes (Signed)
Discussed plan with patient for a CT chest rule out PE.  Tachycardia remains and O2 saturation has been fluctuating with most recent 96% on 2 L.  No chest pain or dyspnea reported.  Patient states that her left leg edema had improved over the past week.  Patient agreeable with the plan.  Husband at bedside.  Patient assisted to the bedside commode without difficulty.

## 2017-11-15 LAB — GLUCOSE, CAPILLARY
GLUCOSE-CAPILLARY: 140 mg/dL — AB (ref 70–99)
GLUCOSE-CAPILLARY: 138 mg/dL — AB (ref 70–99)
Glucose-Capillary: 175 mg/dL — ABNORMAL HIGH (ref 70–99)
Glucose-Capillary: 163 mg/dL — ABNORMAL HIGH (ref 70–99)

## 2017-11-15 LAB — HEMOGLOBIN AND HEMATOCRIT, BLOOD
HCT: 32.8 % — ABNORMAL LOW (ref 36.0–46.0)
HEMATOCRIT: 22.2 % — AB (ref 36.0–46.0)
HEMOGLOBIN: 11.4 g/dL — AB (ref 12.0–15.0)
Hemoglobin: 7.2 g/dL — ABNORMAL LOW (ref 12.0–15.0)

## 2017-11-15 LAB — PREPARE RBC (CROSSMATCH)

## 2017-11-15 MED ORDER — ONDANSETRON HCL 4 MG/2ML IJ SOLN
4.0000 mg | Freq: Once | INTRAMUSCULAR | Status: DC
  Filled 2017-11-15: qty 2

## 2017-11-15 MED ORDER — SODIUM CHLORIDE 0.9% IV SOLUTION
Freq: Once | INTRAVENOUS | Status: AC
  Administered 2017-11-15: 12:00:00 via INTRAVENOUS

## 2017-11-15 MED ORDER — DIPHENHYDRAMINE HCL 25 MG PO CAPS
25.0000 mg | ORAL_CAPSULE | Freq: Once | ORAL | Status: AC
  Administered 2017-11-15: 25 mg via ORAL
  Filled 2017-11-15: qty 1

## 2017-11-15 NOTE — Progress Notes (Addendum)
2 Days Post-Op Procedure(s) (LRB): EXPLORATORY LAPAROTOMY (N/A) BILATERAL SALPINGO OOPHORECTOMY (Bilateral) DEBULKING (N/A)  Subjective: Patient reports nausea last pm with no emesis.  Decreased appetite this am.  No flatus or BM reported.  Moderate pain in the incision when getting out of bed.  Voiding without difficulties.  Stating the dizziness with ambulation is improving.  Patient has rolling walker at the bedside.  Denies chest pain, dyspnea.  Discussed CT chest results.  Need for blood transfusion discussed with patient by myself and Dr. Gerarda Fraction.  Patient consenting without concerns.  No other concerns voiced.  Objective: Vital signs in last 24 hours: Temp:  [98.7 F (37.1 C)-100.8 F (38.2 C)] 99 F (37.2 C) (07/25 1038) Pulse Rate:  [85-107] 94 (07/25 1038) Resp:  [16-18] 18 (07/25 1038) BP: (108-137)/(57-80) 116/63 (07/25 1038) SpO2:  [93 %-99 %] 94 % (07/25 1038) Last BM Date: 11/13/17  Intake/Output from previous day: 07/24 0701 - 07/25 0700 In: 2717.5 [P.O.:1320; I.V.:897.5; IV Piggyback:500] Out: 1425 [Urine:1425]  Physical Examination: General: alert, cooperative and no distress Resp: clear to auscultation bilaterally Cardio: regular rhythm and rate, tachycardia improving, HR 92, no murmurs/clicks/rubs GI: incision: op site dressing in place on midline incision with no active drainage or erythema noted underneath and abdomen soft, hypoactive bowel sounds, tender with light palpation, non-distended Extremities: extremities normal, atraumatic, no cyanosis or edema  Labs: WBC/Hgb/Hct/Plts:  --/7.2/22.2/-- (07/25 0408)    Assessment: 67 y.o. s/p Procedure(s): EXPLORATORY LAPAROTOMY BILATERAL SALPINGO OOPHORECTOMY DEBULKING: stable Pain:  Pain is well-controlled on PRN medications. Discussed use of binder for additional support.  Heme: Hgb 7.2 and Hct 22.2 this am. Anemia related to acute surgical blood loss. Plan for transfusion.  Patient will be receiving an  additional 3 cycles of chemotherapy once healed from surgery.   CV: BP stable and mildly tachycardic improving.  Atenolol ordered.  Continue to monitor.    GI:  Tolerating po: Yes, decreased appetite this am.  Antiemetics ordered PRN.    GU: Voiding adequate amounts.    FEN: Stable post-operatively.  Endo: Diabetes mellitus Type II, under good control..  CBG:  CBG (last 3)  Recent Labs    11/14/17 1659 11/14/17 2135 11/15/17 0745  GLUCAP 134* 144* 175*  .  Prophylaxis: intermittent pneumatic compression boots and Lovenox  Plan: Tranfuse 2 units PRBCs Repeat H and H for 4 hours after completion of 2nd unit Antiemetics ordered as needed Continue to monitor output and vitals closely Encourage ambulation with assist, IS use, deep breathing, and coughing Continue plan of care per Dr. Gerarda Fraction   LOS: 2 days    Dorothyann Gibbs 11/15/2017, 10:58 AM   Patient seen and examined with M.Cross, NP. Agree with above plan. Watch for ileus. Repeat labs after transfusion.

## 2017-11-16 LAB — CBC WITH DIFFERENTIAL/PLATELET
BASOS ABS: 0 10*3/uL (ref 0.0–0.1)
Basophils Relative: 0 %
Eosinophils Absolute: 0.1 10*3/uL (ref 0.0–0.7)
Eosinophils Relative: 0 %
HEMATOCRIT: 32.5 % — AB (ref 36.0–46.0)
HEMOGLOBIN: 11 g/dL — AB (ref 12.0–15.0)
LYMPHS ABS: 1.1 10*3/uL (ref 0.7–4.0)
LYMPHS PCT: 7 %
MCH: 31.3 pg (ref 26.0–34.0)
MCHC: 33.8 g/dL (ref 30.0–36.0)
MCV: 92.3 fL (ref 78.0–100.0)
Monocytes Absolute: 0.9 10*3/uL (ref 0.1–1.0)
Monocytes Relative: 5 %
NEUTROS PCT: 88 %
Neutro Abs: 14.4 10*3/uL — ABNORMAL HIGH (ref 1.7–7.7)
Platelets: 148 10*3/uL — ABNORMAL LOW (ref 150–400)
RBC: 3.52 MIL/uL — AB (ref 3.87–5.11)
RDW: 17.8 % — ABNORMAL HIGH (ref 11.5–15.5)
WBC: 16.5 10*3/uL — ABNORMAL HIGH (ref 4.0–10.5)

## 2017-11-16 LAB — BASIC METABOLIC PANEL
ANION GAP: 7 (ref 5–15)
BUN: 12 mg/dL (ref 8–23)
CHLORIDE: 101 mmol/L (ref 98–111)
CO2: 27 mmol/L (ref 22–32)
Calcium: 8.2 mg/dL — ABNORMAL LOW (ref 8.9–10.3)
Creatinine, Ser: 0.72 mg/dL (ref 0.44–1.00)
GFR calc Af Amer: >60 mL/min (ref >60)
GFR calc non Af Amer: >60 mL/min (ref >60)
GLUCOSE: 126 mg/dL — AB (ref 70–99)
POTASSIUM: 3.6 mmol/L (ref 3.5–5.1)
Sodium: 135 mmol/L (ref 135–145)

## 2017-11-16 LAB — GLUCOSE, CAPILLARY
GLUCOSE-CAPILLARY: 145 mg/dL — AB (ref 70–99)
GLUCOSE-CAPILLARY: 110 mg/dL — AB (ref 70–99)
Glucose-Capillary: 134 mg/dL — ABNORMAL HIGH (ref 70–99)
Glucose-Capillary: 89 mg/dL (ref 70–99)

## 2017-11-16 LAB — TYPE AND SCREEN
ABO/RH(D): B NEG
ANTIBODY SCREEN: NEGATIVE
Unit division: 0
Unit division: 0

## 2017-11-16 LAB — BPAM RBC
BLOOD PRODUCT EXPIRATION DATE: 201908012359
BLOOD PRODUCT EXPIRATION DATE: 201908012359
ISSUE DATE / TIME: 201907251509
ISSUE DATE / TIME: 201907251110
UNIT TYPE AND RH: 1700
Unit Type and Rh: 1700

## 2017-11-16 LAB — MAGNESIUM: Magnesium: 1.6 mg/dL — ABNORMAL LOW (ref 1.7–2.4)

## 2017-11-16 LAB — HEMOGLOBIN AND HEMATOCRIT, BLOOD
HEMATOCRIT: 32.4 % — AB (ref 36.0–46.0)
HEMOGLOBIN: 11.1 g/dL — AB (ref 12.0–15.0)

## 2017-11-16 NOTE — Progress Notes (Addendum)
3 Days Post-Op Procedure(s) (LRB): EXPLORATORY LAPAROTOMY (N/A) BILATERAL SALPINGO OOPHORECTOMY (Bilateral) DEBULKING (N/A)  Subjective: Patient reports nausea this am.  Three episodes of emesis yesterday and twice during the night.  Patient states she threw up bile.  Reporting decreased appetite.  Sipping on clear liquids.  Ambulating with use of rolling walker.  States the dizziness when ambulating is improving.  Voiding without difficulty.  No flatus or BM reported.  Denies chest pain, dyspnea.  No other concerns voiced. Husband and son at the bedside.  Objective: Vital signs in last 24 hours: Temp:  [98.1 F (36.7 C)-99 F (37.2 C)] 98.5 F (36.9 C) (07/26 0411) Pulse Rate:  [87-101] 87 (07/26 0411) Resp:  [16-18] 18 (07/26 0411) BP: (116-158)/(63-98) 142/70 (07/26 0411) SpO2:  [94 %-99 %] 98 % (07/26 0411) Last BM Date: 11/13/17  Intake/Output from previous day: 07/25 0701 - 07/26 0700 In: 2856.8 [P.O.:220; I.V.:1778.8; Blood:858] Out: 1150 [Urine:950; Emesis/NG output:200]  Physical Examination: General: alert, cooperative and no distress Resp: clear to auscultation bilaterally Cardio: regular rhythm and rate, tachycardia improved, HR 86, no murmurs/clicks/rubs GI: incision: op site dressing in place on midline incision with no active drainage or erythema noted underneath and abdomen soft, faint bowel sounds, non-tender with light palpation, tympanic on percussion, slightly distended Extremities: extremities normal, atraumatic, no cyanosis or edema  Port a cath to right chest accessed for lab draws.  No erythema or drainage at the site. O2 sat 94 on RA  Labs: WBC/Hgb/Hct/Plts:  --/11.1/32.4/-- (07/26 0408) BUN/Cr/glu/ALT/AST/amyl/lip:  12/0.72/--/--/--/--/-- (07/26 0408)  Assessment: 67 y.o. s/p Procedure(s): EXPLORATORY LAPAROTOMY BILATERAL SALPINGO OOPHORECTOMY DEBULKING: stable Pain:  Pain is well-controlled on PRN medications.   Heme: Hgb 11.1 and Hct 32.4 this  am. S/P transfusion of 2 units PRBCs on 11/15/17 for anemia related to acute surgical blood loss.  Of note, patient will be receiving an additional 3 cycles of chemotherapy once healed from surgery.   CV: BP stable and mildly tachycardic improved.  Atenolol ordered.  Continue to monitor.    GI:  Tolerating po: minimal amount due to nausea, decreased appetite this am.  Antiemetics ordered PRN. Emesis 11/15/17 and overnight.    GU: Voiding adequate amounts.    FEN: Stable post-operatively.  Endo: Diabetes mellitus Type II, under good control..  CBG:  CBG (last 3)  Recent Labs    11/15/17 1715 11/15/17 2105 11/16/17 0807  GLUCAP 140* 138* 145*  .  Prophylaxis: intermittent pneumatic compression boots and Lovenox  Plan: Adding CBC with diff on the labs drawn this am if possible Antiemetics ordered as needed CBC, Bmet, and C reactive protein in the am Continue to monitor output and vitals closely Encourage ambulation with assist, IS use, deep breathing, and coughing Continue plan of care per Dr. Denman George   LOS: 3 days    Sabrina Vang 11/16/2017, 8:55 AM   Update: 10:30am CBC with diff reviewed with Dr. Denman George.  Plan for repeat labs in the am.

## 2017-11-16 NOTE — Care Management Important Message (Signed)
Important Message  Patient Details  Name: Sabrina Vang MRN: [id number] Date of Birth: 04/05/1951   Medicare Important Message Given:  Yes    Kerin Salen 11/16/2017, 10:16 AMImportant Message  Patient Details  Name: Sabrina Vang MRN: [id number] Date of Birth: Feb 12, 1951   Medicare Important Message Given:  Yes    Kerin Salen 11/16/2017, 10:16 AM

## 2017-11-16 NOTE — Progress Notes (Signed)
Two episodes of vomiting bilious emesis, about 100 to 150 cc each.  She was given zofran twice on this shift.  Stated she felt better after vomiting.  This seems to be going on since yesterday.  Will notify MD this am.

## 2017-11-17 LAB — CBC WITH DIFFERENTIAL/PLATELET
Basophils Absolute: 0 10*3/uL (ref 0.0–0.1)
Basophils Relative: 0 %
EOS ABS: 0.4 10*3/uL (ref 0.0–0.7)
EOS PCT: 3 %
HCT: 30.9 % — ABNORMAL LOW (ref 36.0–46.0)
Hemoglobin: 10.5 g/dL — ABNORMAL LOW (ref 12.0–15.0)
LYMPHS ABS: 1.4 10*3/uL (ref 0.7–4.0)
Lymphocytes Relative: 10 %
MCH: 31.4 pg (ref 26.0–34.0)
MCHC: 34 g/dL (ref 30.0–36.0)
MCV: 92.5 fL (ref 78.0–100.0)
MONO ABS: 0.9 10*3/uL (ref 0.1–1.0)
MONOS PCT: 6 %
Neutro Abs: 11.7 10*3/uL — ABNORMAL HIGH (ref 1.7–7.7)
Neutrophils Relative %: 81 %
Platelets: 153 10*3/uL (ref 150–400)
RBC: 3.34 MIL/uL — AB (ref 3.87–5.11)
RDW: 17.5 % — ABNORMAL HIGH (ref 11.5–15.5)
WBC: 14.5 10*3/uL — AB (ref 4.0–10.5)

## 2017-11-17 LAB — BASIC METABOLIC PANEL
Anion gap: 7 (ref 5–15)
BUN: 16 mg/dL (ref 8–23)
CHLORIDE: 104 mmol/L (ref 98–111)
CO2: 24 mmol/L (ref 22–32)
CREATININE: 0.61 mg/dL (ref 0.44–1.00)
Calcium: 7.7 mg/dL — ABNORMAL LOW (ref 8.9–10.3)
GFR calc Af Amer: >60 mL/min (ref >60)
GFR calc non Af Amer: >60 mL/min (ref >60)
GLUCOSE: 88 mg/dL (ref 70–99)
Potassium: 3.1 mmol/L — ABNORMAL LOW (ref 3.5–5.1)
SODIUM: 135 mmol/L (ref 135–145)

## 2017-11-17 LAB — GLUCOSE, CAPILLARY
GLUCOSE-CAPILLARY: 110 mg/dL — AB (ref 70–99)
GLUCOSE-CAPILLARY: 126 mg/dL — AB (ref 70–99)
GLUCOSE-CAPILLARY: 104 mg/dL — AB (ref 70–99)
Glucose-Capillary: 82 mg/dL (ref 70–99)

## 2017-11-17 LAB — C-REACTIVE PROTEIN: CRP: 6.9 mg/dL — ABNORMAL HIGH (ref <1.0)

## 2017-11-17 MED ORDER — FERROUS GLUCONATE 324 (38 FE) MG PO TABS
324.0000 mg | ORAL_TABLET | Freq: Two times a day (BID) | ORAL | Status: DC
  Administered 2017-11-17 – 2017-11-18 (×2): 324 mg via ORAL
  Filled 2017-11-17 (×2): qty 1

## 2017-11-17 MED ORDER — ENOXAPARIN (LOVENOX) PATIENT EDUCATION KIT
PACK | Freq: Once | Status: AC
  Administered 2017-11-17: 11:00:00
  Filled 2017-11-17: qty 1

## 2017-11-17 MED ORDER — POTASSIUM CHLORIDE 10 MEQ/50ML IV SOLN
10.0000 meq | INTRAVENOUS | Status: AC
  Administered 2017-11-17 (×6): 10 meq via INTRAVENOUS
  Filled 2017-11-17 (×6): qty 50

## 2017-11-17 NOTE — Discharge Summary (Signed)
Physician Discharge Summary  Patient ID: Sabrina Vang MRN: [id number] DOB/AGE: December 05, 1950 67 y.o.  Admit date: 11/13/2017 Discharge date: 11/18/2017  Admission Diagnoses: Ovarian cancer [facility])  Discharge Diagnoses:  Principal Problem:   Ovarian cancer [facility]) Active Problems:   Omental mass   Discharged Condition: good  Hospital Course:  1/ patient was admitted on 11/13/17 for an exploratory laparotomy, TAH, BSO, omentectomy, radical tumor debulking for ovarian cancer. 2/ surgery was uncomplicated  3/ on postoperative day 1 the patient demonstrated severe acute postop blood loss anemia and was transfused 2 units of PRBC with an appropriate rise the hemoglobin to 11mg /dL. It remained stable throughout the admission without evidence of ongoing blood loss. 4/ On POD 2 she manifested signs of delayed return of bowel function/ileus and developed nausea and emesis. Her diet was changed to clear liquids and her IVF's were increased. This resolved by POD 4.  5/ On the evening of POD 4 she developed diarrhea (7 times overnight). This became less in the morning of POD 5 and she is no longer having loose stools. 6/ On POD 5 the patient was meeting discharge criteria: tolerating PO, voiding urine, ambulating, passing flatus, tolerating PO, pain well controlled on oral medications.  7/ new medications on discharge include percocet, sennakot (prn if the diarrhea changes to constipation), imodium (prn while having diarrhea), ferrous gluconate, and a total course of 28 days of Lovenox.  Consults: None  Significant Diagnostic Studies: labs:  CBC    Component Value Date/Time   WBC 14.5 (H) 11/17/2017 0438   RBC 3.34 (L) 11/17/2017 0438   HGB 10.5 (L) 11/17/2017 0438   HGB 12.1 10/17/2017 0751   HCT 30.9 (L) 11/17/2017 0438   PLT 153 11/17/2017 0438   PLT 266 10/17/2017 0751   MCV 92.5 11/17/2017 0438   MCH 31.4 11/17/2017 0438   MCHC 34.0 11/17/2017 0438   RDW 17.5 (H) 11/17/2017 0438    LYMPHSABS 1.4 11/17/2017 0438   MONOABS 0.9 11/17/2017 0438   EOSABS 0.4 11/17/2017 0438   BASOSABS 0.0 11/17/2017 0438   BMET    Component Value Date/Time   NA 140 11/18/2017 0340   K 3.9 11/18/2017 0340   CL 108 11/18/2017 0340   CO2 27 11/18/2017 0340   GLUCOSE 80 11/18/2017 0340   BUN 14 11/18/2017 0340   CREATININE 0.69 11/18/2017 0340   CREATININE 0.82 10/17/2017 0751   CREATININE 0.86 06/05/2014 0957   CALCIUM 8.1 (L) 11/18/2017 0340   GFRNONAA >60 11/18/2017 0340   GFRNONAA >60 10/17/2017 0751   GFRAA >60 11/18/2017 0340   GFRAA >60 10/17/2017 0751    and radiology: CT scan: chest/angio on 11/14/17 for tachycardia and hypoxemia: IMPRESSION: no definite evidence of pulmonary embolism. Mild bilatera lposterior basilar subsemental atelectasis with minimal pleural effusions.  Treatments: IV hydration and surgery: see above, blood transfusion, iron replacement, potassium replacement.   Discharge Exam: Blood pressure 116/64, pulse 75, temperature 98.7 F (37.1 C), temperature source Oral, resp. rate 15, height 5\' 5"  (1.651 m), weight 153 lb (69.4 kg), SpO2 97 %. General appearance: alert and cooperative GI: soft, non-tender; bowel sounds normal; no masses,  no organomegaly Extremities: extremities normal, atraumatic, no cyanosis or edema Incision/Wound: clean dry and intact with dressing   Disposition: Discharge disposition: 01-Home or Self Care       Discharge Instructions    (HEART FAILURE PATIENTS) Call MD:  Anytime you have any of the following symptoms: 1) 3 pound weight gain in 24 hours or  5 pounds in 1 week 2) shortness of breath, with or without a dry hacking cough 3) swelling in the hands, feet or stomach 4) if you have to sleep on extra pillows at night in order to breathe.   Complete by:  As directed    Call MD for:  difficulty breathing, headache or visual disturbances   Complete by:  As directed    Call MD for:  extreme fatigue   Complete by:  As  directed    Call MD for:  hives   Complete by:  As directed    Call MD for:  persistant dizziness or light-headedness   Complete by:  As directed    Call MD for:  persistant nausea and vomiting   Complete by:  As directed    Call MD for:  redness, tenderness, or signs of infection (pain, swelling, redness, odor or green/yellow discharge around incision site)   Complete by:  As directed    Call MD for:  severe uncontrolled pain   Complete by:  As directed    Call MD for:  temperature >100.4   Complete by:  As directed    Diet - low sodium heart healthy   Complete by:  As directed    Diet general   Complete by:  As directed    Driving Restrictions   Complete by:  As directed    No driving for 7 days or until off narcotic pain medication   Increase activity slowly   Complete by:  As directed    Remove dressing in 24 hours   Complete by:  As directed    Sexual Activity Restrictions   Complete by:  As directed    No intercourse for 6 weeks     Allergies as of 11/18/2017   No Known Allergies     Medication List    TAKE these medications   acetaminophen 500 MG tablet Commonly known as:  TYLENOL Take 500 mg by mouth every 8 (eight) hours as needed for mild pain or moderate pain.   atenolol 25 MG tablet Commonly known as:  TENORMIN Take 25 mg by mouth at bedtime.   CALCIUM-MAGNESIUM-VITAMIN D PO Take 1 tablet by mouth daily.   dexamethasone 4 MG tablet Commonly known as:  DECADRON Take 3 tabs the night before chemo and 3 tabs in the morning of chemotherapy, every 3 weeks   enoxaparin 40 MG/0.4ML injection Commonly known as:  LOVENOX Inject 0.4 mLs (40 mg total) into the skin daily.   famotidine 20 MG tablet Commonly known as:  PEPCID Take 20 mg by mouth as needed for heartburn or indigestion.   ferrous gluconate 324 MG tablet Commonly known as:  FERGON Take 1 tablet (324 mg total) by mouth 2 (two) times daily with a meal.   ibuprofen 600 MG tablet Commonly known  as:  ADVIL,MOTRIN Take 1 tablet (600 mg total) by mouth every 6 (six) hours as needed for mild pain.   lidocaine-prilocaine cream Commonly known as:  EMLA Apply 1 application topically as needed.   loperamide 2 MG capsule Commonly known as:  IMODIUM Take 2 capsules (4 mg total) by mouth as needed for diarrhea or loose stools.   loratadine 10 MG tablet Commonly known as:  CLARITIN Take 10 mg by mouth daily as needed for allergies.   metFORMIN 1000 MG tablet Commonly known as:  GLUCOPHAGE Take 500 mg by mouth 2 (two) times daily.   multivitamin with minerals Tabs tablet Take 1  tablet by mouth daily.   ondansetron 8 MG tablet Commonly known as:  ZOFRAN Take 1 tablet (8 mg total) by mouth every 8 (eight) hours as needed for nausea.   oxyCODONE 5 MG immediate release tablet Commonly known as:  Oxy IR/ROXICODONE Take 1 tablet (5 mg total) by mouth every 4 (four) hours as needed for severe pain or breakthrough pain.   prochlorperazine 10 MG tablet Commonly known as:  COMPAZINE Take 1 tablet (10 mg total) by mouth every 6 (six) hours as needed for nausea or vomiting.            Durable Medical Equipment  (From admission, onward)        Start     Ordered   11/14/17 1014  For home use only DME 4 wheeled rolling walker with seat  Once    Comments:  Post-operative weakness  Question:  Patient needs a walker to treat with the following condition  Answer:  Generalized weakness   11/14/17 1014     Follow-up Information    Precious Haws B, MD Follow up in 2 week(s).   Specialty:  Gynecologic Oncology Contact information: [place] [address] [phone]           Signed: Thereasa Solo 11/18/2017, 10:11 AM

## 2017-11-17 NOTE — Progress Notes (Signed)
4 Days Post-Op Procedure(s) (LRB): EXPLORATORY LAPAROTOMY (N/A) BILATERAL SALPINGO OOPHORECTOMY (Bilateral) DEBULKING (N/A)  Subjective: Patient feels much better today.  She passed flatus and is ambulating. Slept well.  Objective: Vital signs in last 24 hours: Temp:  [98.4 F (36.9 C)-98.9 F (37.2 C)] 98.6 F (37 C) (07/27 0450) Pulse Rate:  [82-87] 82 (07/27 0450) Resp:  [16-18] 18 (07/27 0450) BP: (117-127)/(63-75) 117/63 (07/27 0450) SpO2:  [93 %-95 %] 95 % (07/27 0450) Last BM Date: 11/13/17  Intake/Output from previous day: 07/26 0701 - 07/27 0700 In: 990 [P.O.:270; I.V.:720] Out: 1250 [Urine:1250]  Physical Examination: General: alert, cooperative and no distress Resp: clear to auscultation bilaterally Cardio: regular rhythm and rate, tachycardia improved, no murmurs/clicks/rubs GI: incision: op site dressing in place on midline incision with no active drainage or erythema noted underneath and abdomen soft, + bowel sounds, non-tender with light palpation, tympanic on percussion, slightly distended Extremities: extremities normal, atraumatic, no cyanosis or edema  Port a cath to right chest accessed for lab draws.  No erythema or drainage at the site. O2 sat 94 on RA  Labs: WBC/Hgb/Hct/Plts:  14.5/10.5/30.9/153 (07/27 0438) BUN/Cr/glu/ALT/AST/amyl/lip:  16/0.61/--/--/--/--/-- (07/27 0438) BMET    Component Value Date/Time   NA 135 11/17/2017 0438   K 3.1 (L) 11/17/2017 0438   CL 104 11/17/2017 0438   CO2 24 11/17/2017 0438   GLUCOSE 88 11/17/2017 0438   BUN 16 11/17/2017 0438   CREATININE 0.61 11/17/2017 0438   CREATININE 0.82 10/17/2017 0751   CREATININE 0.86 06/05/2014 0957   CALCIUM 7.7 (L) 11/17/2017 0438   GFRNONAA >60 11/17/2017 0438   GFRNONAA >60 10/17/2017 0751   GFRAA >60 11/17/2017 0438   GFRAA >60 10/17/2017 0751   CBC    Component Value Date/Time   WBC 14.5 (H) 11/17/2017 0438   RBC 3.34 (L) 11/17/2017 0438   HGB 10.5 (L) 11/17/2017  0438   HGB 12.1 10/17/2017 0751   HCT 30.9 (L) 11/17/2017 0438   PLT 153 11/17/2017 0438   PLT 266 10/17/2017 0751   MCV 92.5 11/17/2017 0438   MCH 31.4 11/17/2017 0438   MCHC 34.0 11/17/2017 0438   RDW 17.5 (H) 11/17/2017 0438   LYMPHSABS 1.4 11/17/2017 0438   MONOABS 0.9 11/17/2017 0438   EOSABS 0.4 11/17/2017 0438   BASOSABS 0.0 11/17/2017 0438    Assessment: 67 y.o. s/p Procedure(s): EXPLORATORY LAPAROTOMY BILATERAL SALPINGO OOPHORECTOMY DEBULKING: stable Pain:  Pain is well-controlled on PRN medications.   Heme: Hgb 10.5 this am. S/P transfusion of 2 units PRBCs on 11/15/17 for anemia related to acute surgical blood loss.  Of note, patient will be receiving an additional 3 cycles of chemotherapy once healed from surgery.   CV: BP stable and mildly tachycardic improved.  Atenolol ordered.  Continue to monitor.    GI:  Tolerating po: yes. Not distended.     GU: Voiding adequate amounts.    FEN: hypokalemia - replace K+ with 80meq  ID: white count decreasing.  Endo: Diabetes mellitus Type II, under good control..  CBG:  CBG (last 3)  Recent Labs    11/16/17 1708 11/16/17 2132 11/17/17 0736  GLUCAP 89 110* 82  .  Prophylaxis: intermittent pneumatic compression boots and Lovenox  Plan: Replace potassium Continue to monitor output and vitals closely Encourage ambulation with assist, IS use, deep breathing, and coughing Anticipate discharge tomorrow.   LOS: 4 days    Thereasa Solo 11/17/2017, 9:34 AM

## 2017-11-18 ENCOUNTER — Other Ambulatory Visit: Payer: Self-pay | Admitting: Gynecologic Oncology

## 2017-11-18 DIAGNOSIS — C561 Malignant neoplasm of right ovary: Secondary | ICD-10-CM

## 2017-11-18 LAB — BASIC METABOLIC PANEL
Anion gap: 5 (ref 5–15)
BUN: 14 mg/dL (ref 8–23)
CO2: 27 mmol/L (ref 22–32)
Calcium: 8.1 mg/dL — ABNORMAL LOW (ref 8.9–10.3)
Chloride: 108 mmol/L (ref 98–111)
Creatinine, Ser: 0.69 mg/dL (ref 0.44–1.00)
GFR calc Af Amer: >60 mL/min (ref >60)
Glucose, Bld: 80 mg/dL (ref 70–99)
Potassium: 3.9 mmol/L (ref 3.5–5.1)
SODIUM: 140 mmol/L (ref 135–145)

## 2017-11-18 LAB — GLUCOSE, CAPILLARY: Glucose-Capillary: 79 mg/dL (ref 70–99)

## 2017-11-18 MED ORDER — FERROUS GLUCONATE 324 (38 FE) MG PO TABS: 324.0000 mg | ORAL_TABLET | Freq: Two times a day (BID) | ORAL | 3 refills | Status: DC

## 2017-11-18 MED ORDER — IBUPROFEN 600 MG PO TABS
600.0000 mg | ORAL_TABLET | Freq: Four times a day (QID) | ORAL | 0 refills | Status: DC | PRN
Start: 2017-11-18 — End: 2017-11-18

## 2017-11-18 MED ORDER — OXYCODONE HCL 5 MG PO TABS: 5.0000 mg | ORAL_TABLET | ORAL | 0 refills | Status: DC | PRN

## 2017-11-18 MED ORDER — LOPERAMIDE HCL 2 MG PO CAPS: 4.0000 mg | ORAL_CAPSULE | ORAL | 0 refills | Status: DC | PRN

## 2017-11-18 MED ORDER — HEPARIN SOD (PORK) LOCK FLUSH 100 UNIT/ML IV SOLN
500.0000 [IU] | INTRAVENOUS | Status: AC | PRN
  Administered 2017-11-18: 500 [IU]

## 2017-11-18 MED ORDER — ENOXAPARIN SODIUM 40 MG/0.4ML ~~LOC~~ SOLN: 40.0000 mg | SUBCUTANEOUS | 0 refills | Status: DC

## 2017-11-18 NOTE — Discharge Instructions (Signed)
11/17/2017  Return to work: 4 weeks  Activity: 1. Be up and out of the bed during the day.  Take a nap if needed.  You may walk up steps but be careful and use the hand rail.  Stair climbing will tire you more than you think, you may need to stop part way and rest.   2. No lifting or straining for 6 weeks.  3. No driving for 1 weeks.  Do Not drive if you are taking narcotic pain medicine.  4. Shower daily.  Use soap and water on your incision and pat dry; don't rub.   5. No sexual activity and nothing in the vagina for 8 weeks.  Medications:  - Take ibuprofen and tylenol first line for pain control. Take these regularly (every 6 hours) to decrease the build up of pain.  - If necessary, for severe pain not relieved by ibuprofen, take oxycodone.  - While taking oxycodone you should take sennakot every night if you develop constipation. Do not use this while you are having diarrhea.  If this causes diarrhea, stop its use.  - Take lovenox 40mg  injection once daily for 23 days to prevent blood clots.   - take ferrous gluconate (iron to correct your anemia) morning and night for 2 months. It is important to use sennakot or a stool softener while using this.   - imodium every 4 hours as needed for diarrhea.  Diet: 1. Low sodium Heart Healthy Diet is recommended.  2. It is safe to use a laxative if you have difficulty moving your bowels.   Wound Care: 1. Keep clean and dry.  Shower daily.  Reasons to call the Doctor:   Diarrhea more than 10 times per day. For this, Dr Gerarda Fraction would want you to be seen to ensure that you are not dehydrated and do not have an infection in the colon.  Fever - Oral temperature greater than 100.4 degrees Fahrenheit  Foul-smelling vaginal discharge  Difficulty urinating  Nausea and vomiting  Increased pain at the site of the incision that is unrelieved with pain medicine.  Difficulty breathing with or without chest pain  New calf pain especially  if only on one side  Sudden, continuing increased vaginal bleeding with or without clots.   Follow-up: 1. See Dr Gerarda Fraction in 1-2 weeks as scheduled.  Contacts: For questions or concerns you should contact:  Dr. Gerarda Fraction at [phone] After hours and on week-ends call [phone] and ask to speak to the physician on call for Gynecologic Oncology

## 2017-11-18 NOTE — Progress Notes (Signed)
Assessment unchanged. Pt verbalized understanding of dc instructions through teach back including follow up care, when to call the doctor, as well as meds to resume. No scripts at dc. Lovenox teaching kit with pt. Husband plans to give injections at home. Discharged via wc to front entrance accompanied by family and NT.

## 2017-11-20 ENCOUNTER — Encounter: Payer: Self-pay | Admitting: Genetic Counselor

## 2017-11-20 ENCOUNTER — Telehealth: Payer: Self-pay | Admitting: Genetic Counselor

## 2017-11-20 NOTE — Telephone Encounter (Signed)
Revealed negative genetic testing.  Discussed that we do not know why she has ovarian cancer or why there is cancer in the family. It could be due to a different gene that we are not testing, or maybe our current technology may not be able to pick something up.  It will be important for her to keep in contact with genetics to keep up with whether additional testing may be needed.  Discussed that HRD testing will be performed on her ovarian tumor.  This testing will start after 14 days after discharge, based on Medicare guidelines, and it will take about 2-3 weeks after that.  Patient voiced her understanding.

## 2017-11-22 ENCOUNTER — Telehealth: Payer: Self-pay

## 2017-11-22 NOTE — Telephone Encounter (Signed)
Pt noticed her ankles and feet sweelling last evening.  They were puffu.   The swellin was down this am, however it has increased bilaterally again today. No pain with bearing wt on legs.  No reness or hot spots in calves, back of knees, or thighs. Pt taking Lovenox injections as directed.   No SOB or Chest pain. Reviewed with Joylene John, NP. Told Ms Samarin that Lenna Sciara said to monitor the swelling.  If it gets worse,  to call back to the office. Pt verbalized understanding.

## 2017-11-26 ENCOUNTER — Inpatient Hospital Stay: Payer: Medicare HMO | Attending: Obstetrics | Admitting: Obstetrics

## 2017-11-26 ENCOUNTER — Telehealth: Payer: Self-pay | Admitting: Oncology

## 2017-11-26 ENCOUNTER — Encounter: Payer: Self-pay | Admitting: Obstetrics

## 2017-11-26 VITALS — BP 130/67 | HR 88 | Temp 98.0°F | Resp 20 | Ht 65.0 in | Wt 147.8 lb

## 2017-11-26 DIAGNOSIS — C771 Secondary and unspecified malignant neoplasm of intrathoracic lymph nodes: Secondary | ICD-10-CM | POA: Diagnosis not present

## 2017-11-26 DIAGNOSIS — Z9221 Personal history of antineoplastic chemotherapy: Secondary | ICD-10-CM | POA: Insufficient documentation

## 2017-11-26 DIAGNOSIS — Z90722 Acquired absence of ovaries, bilateral: Secondary | ICD-10-CM | POA: Insufficient documentation

## 2017-11-26 DIAGNOSIS — Z90710 Acquired absence of both cervix and uterus: Secondary | ICD-10-CM | POA: Diagnosis not present

## 2017-11-26 DIAGNOSIS — C561 Malignant neoplasm of right ovary: Secondary | ICD-10-CM | POA: Diagnosis present

## 2017-11-26 NOTE — Telephone Encounter (Signed)
Debbie from [employer] called about HRD testing.  She wanted to let us know that patient's HRD testing may be denied until she fails chemotherapy and needs a PARP inhibitor.  If she has metastatic disease it may go through. She will let us know today if it has been denied.

## 2017-11-26 NOTE — Patient Instructions (Addendum)
1. Return in one month for another postop check 2. Followup with Dr. Alvy Bimler for chemo restart. You will be called with the follow up appointment.

## 2017-11-27 ENCOUNTER — Encounter (HOSPITAL_COMMUNITY): Payer: Self-pay | Admitting: Obstetrics

## 2017-11-27 NOTE — Progress Notes (Addendum)
GYNECOLOGIC ONCOLOGY - [facility] at [facility] at [facility]  Progress Note : GYN-ONC Established Patient FOLLOW-UP   Originally consult was requested by Dr. Evie Lacks   CC:  Chief Complaint  Patient presents with  . Right ovarian epithelial cancer Sabrina Vang)    HPI: Sabrina Vang  is a very nice 67 y.o.  P2   Interval History: Cycles thus far: 09/05/17 - C1 - Carbo / Taxol  09/26/17 - C2 - Carbo / Taxol with flushing noted 10/17/17 - C3 - Carbo / Abraxane due to Taxol reaction above.  Ca125 has decreased  . 07/30/2017-Ca125-  521.3 performed at [facility] . 08/31/17 = 819.9 (C1) . 09/26/17 = not done? (C2) . 10/17/17 = 374.4 (C3) Imaging postcycle 3 showed response in the paracardiac adenopathy from 87m --> 957m However all other regions showed overall stable disease. There was no progression noted.  I did take her 11/13/17 for Exploratory laparotomy,bilateral salpingo-oophorectomy with radical tumor debulking for ovarian cancer including retroperitoneal dissection, lysis of adhesions (enterolysis of small bowel in deep pelvis, left adnexal adhesions to sigmoid and omentum to LLQ) ~1 hour, ureterolysis. Omentectomy; Takedown of splenic flexure with removal of omental tumor in left upper quadrant. Findings included; Upon entry a large ~20cm right tube/ovary, mostly cystic. Adherent rind along ileocecal region and appendix. Large cystic left tube/ovary ~10cm with sigmoid colon stretched across the mass and extension of the cystic lesion into the deep pelvis with residual palpable disease deep pelvis near levators. Estimate ~1cm or less on palpation, not visible disease. Omental caking in the LLQ adherent to pelvic sidewall. Omental disease noted RUQ separate. In addition ~1.5cm lesion in splenic flexure omentum. Evidence of prior diaphragmatic disease, no visible disease. This represented an optimal cytoreduction with gross residual disease remaining in the deep pelvis  near the levator floor and suspect in the region of the right IP ligament/periappendiceal region.   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  She is doing well today with some expected postoperative discomfort, bilateral pedal edema. Denies N/V. Bowels and bladder are functioning.   Presenting History:  She presented to her gynecologist for her annual exam and noted symptoms of bloating along with decreased appetite however increased weight.  On physical exam her gynecologist noted a palpable mass and imaging was ordered including ultrasound.  Ultrasound revealed a complex cystic mass in the right adnexa measuring 20 x 11 x 12 cm with multiple internal septations some of which are thick.  The left adnexa measured 12.7 x 11.6 x 8.1 with low level echoes and soft tissue nodules.  This was 07/30/2017.  The same day she had a Ca125 equal to 521.3.  At some point a provider ordered an MRI of the abdomen and pelvis with and without contrast the lower chest revealed several prominent lower anterior mediastinal lymph nodes including a pericardial measuring 12 mm in short axis.  Hepatic steatosis.  Surface of the liver is noted to have several soft tissue areas of thickening and enhancement suspicious for serosal implants.  Peritoneal disease is expected in the upper abdomen adjacent to the liver and transverse colon.  Extensive omental and peritoneal implants are appreciated in the left lower quadrant.  She does have an abdominal MRI in 2018 for pancreatic  cyst no abdominal pelvic mass or peritoneal disease was noted at that time.  Looking back she thinks that over the 6 months prior to presentation she noted she had just not been  feeling her usual self with decreased energy and occasional discomfort in the abdomen.  Again she notes a weight gain despite a decreased appetite.  She describes her pain as being vaginal and abdominal 5 out of 10 sharp to dull it is brief but aggravated by sitting she alleviates it with anti-inflammatories.  She does also note bloating increased symptoms of reflux and urinary urgency.  Her bowel movements do not seem to be affected she is having daily bowel movements and denies persistent nausea and vomiting.  She has had some nausea which occurs about twice per week.  On a side note she was a widow and recently married a new gentleman.  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 There is mucinous differentiation. In addition a PET was performed to look at the mediastinal/cardiophrenic lymph node.  Unfortunately that did show FDG uptake and appears to be consistent with metastatic disease which would make her unresectable in the upfront setting.  She started chemo 09/05/17 with Carbo/Taxol Post Cycle 3 scan on  10/31/2017 -- There is a tubular filling defect extending superiorly into the right internal jugular vein (axial images 2 through 7/2), not typical for inflow artifact. This could reflect nonocclusive thrombus. The left internal jugular vein and SVC appear normal. Aberrant right subclavian artery noted. -- The previously demonstrated bilateral paracardiac nodes have improved. Node at the right cardiophrenic angle measures 8 mm short axis on image 43/2 (previously 19 mm). Left pericardiac node measures 9 mm on image 48/2 (previously 13 mm). Additional small mediastinal and axillary lymph nodes bilaterally are stable. There is mild distal esophageal wall thickening. The trachea appears unremarkable. Lungs/Pleura: There is no pleural effusion. Mild dependent atelectasis at both lung bases. No suspicious pulmonary nodules. Musculoskeletal/Chest wall: No chest wall  mass or suspicious osseous findings. CT ABDOMEN AND PELVIS FINDINGS Hepatobiliary: Hepatic steatosis as before. No focal hepatic lesion or abnormal enhancement. No significant biliary dilatation status post cholecystectomy. Pancreas: Unremarkable. No pancreatic ductal dilatation or surrounding inflammatory changes. Spleen: Normal in size without focal abnormality. Adrenals/Urinary Tract: Both adrenal glands appear normal. There is a stable cyst in the interpolar region of the right kidney. No evidence of renal mass, urinary tract calculus or hydronephrosis. No significant bladder findings. Stomach/Bowel: No evidence of bowel wall thickening, distention or surrounding inflammatory change. There is peripheral displacement of the bowel by the large bilateral adnexal lesions. Vascular/Lymphatic: There are no enlarged abdominal or pelvic lymph nodes. There are stable small inguinal lymph nodes bilaterally mild aortic and branch vessel atherosclerosis. There is flattening of the IVC without evidence of thrombosis. Reproductive: Previous hysterectomy. Large complex bilateral adnexal masses are again noted. The right adnexal lesion measures up to 21.5 x 13.6 cm transverse (image 77/2) and extends 24.7 cm cephalocaudad. The irregular solid components along the inferolateral aspect of this lesion have not significantly changed. Left adnexal lesion measures 13.0 x 7.3 x 13.6 cm and demonstrates increased density within a dependent component which measures 2.2 x 2.5 cm transverse on image 102/2. Other: Omental nodularity is similar to the previous studies, hypermetabolic on PET-CT. Largest component measures up to 5.3 x 1.8 cm on image 99/2. No significant associated ascites. Musculoskeletal: No acute or significant osseous findings. IMPRESSION: 1. No significant change in size of the large complex  bilateral adnexal masses consistent with the known history of ovarian cancer. There is increased dependent density within the  left-sided lesion. 2. Stable peritoneal carcinomatosis.  No significant ascites. 3. The bilateral pericardiac adenopathy has improved. No progressive thoracic metastatic disease. No pulmonary or osseous metastatic disease. 4. Suspicion of nonocclusive thrombus in the right internal jugular vein related to the right IJ port. Recommend further evaluation with Doppler ultrasound.   Measurement of disease:  Recent Labs    08/31/17 1118 10/17/17 0751  [id number] 819.9* 374.4*    . 07/30/2017-Ca125-  521.3 performed at [place] . 08/31/17 = 819.9 (C1) . 09/26/17 = not done (C2) . 10/17/17 = 374.4   CEA and CA19-9 ordered 08/22/17 given the "mucinous" differentiation in the biopsy report -both normal   Radiology:   08/02/2017-MRI of the abdomen and pelvis with and without contrast -performed in [place], [place] -  the lower chest revealed several prominent lower anterior mediastinal lymph nodes including a pericardial measuring 12 mm in short axis.  Hepatic steatosis.  Surface of the liver is noted to have several soft tissue areas of thickening and enhancement suspicious for serosal implants.  Peritoneal disease is expected in the upper abdomen adjacent to the liver and transverse colon.  Extensive omental and peritoneal implants are appreciated in the left lower quadrant.  08/20/2017 chest CT-[place]-as noted in the oncologic history  08/20/2017-PET scan-[place]-as noted in the oncologic history 10/31/2017 - CT CAP - [facility] CLINICAL DATA:  Ovarian cancer diagnosed in April 2019. Chemotherapy in progress. No current complaints. EXAM: CT CHEST, ABDOMEN, AND PELVIS WITH CONTRAST TECHNIQUE: Multidetector CT imaging of the chest, abdomen and pelvis was performed following the standard protocol during bolus administration of intravenous contrast. CONTRAST:  149m ISOVUE-300 IOPAMIDOL (ISOVUE-300) INJECTION 61% COMPARISON:  Abdominopelvic MRI 08/02/2017, PET-CT 08/21/1998 chest CT 08/20/2017. FINDINGS: CT  CHEST FINDINGS Cardiovascular: Interval placement of a right IJ Port-A-Cath extends to the lower SVC. There is a tubular filling defect extending superiorly into the right internal jugular vein (axial images 2 through 7/2), not typical for inflow artifact. This could reflect nonocclusive thrombus. The left internal jugular vein and SVC appear normal. Aberrant right subclavian artery noted. The heart size is normal. There is no pericardial effusion. Mediastinum/Nodes: The previously demonstrated bilateral paracardiac nodes have improved. Node at the right cardiophrenic angle measures 8 mm short axis on image 43/2 (previously 19 mm). Left pericardiac node measures 9 mm on image 48/2 (previously 13 mm). Additional small mediastinal and axillary lymph nodes bilaterally are stable. There is mild distal esophageal wall thickening. The trachea appears unremarkable. Lungs/Pleura: There is no pleural effusion. Mild dependent atelectasis at both lung bases. No suspicious pulmonary nodules. Musculoskeletal/Chest wall: No chest wall mass or suspicious osseous findings. CT ABDOMEN AND PELVIS FINDINGS Hepatobiliary: Hepatic steatosis as before. No focal hepatic lesion or abnormal enhancement. No significant biliary dilatation status post cholecystectomy. Pancreas: Unremarkable. No pancreatic ductal dilatation or surrounding inflammatory changes. Spleen: Normal in size without focal abnormality. Adrenals/Urinary Tract: Both adrenal glands appear normal. There is a stable cyst in the interpolar region of the right kidney. No evidence of renal mass, urinary tract calculus or hydronephrosis. No significant bladder findings. Stomach/Bowel: No evidence of bowel wall thickening, distention or surrounding inflammatory change. There is peripheral displacement of the bowel by the large bilateral adnexal lesions. Vascular/Lymphatic: There are no enlarged abdominal or pelvic lymph nodes. There are stable small inguinal lymph nodes  bilaterally mild aortic and branch vessel atherosclerosis. There is flattening  of the IVC without evidence of thrombosis. Reproductive: Previous hysterectomy. Large complex bilateral adnexal masses are again noted. The right adnexal lesion measures up to 21.5 x 13.6 cm transverse (image 77/2) and extends 24.7 cm cephalocaudad. The irregular solid components along the inferolateral aspect of this lesion have not significantly changed. Left adnexal lesion measures 13.0 x 7.3 x 13.6 cm and demonstrates increased density within a dependent component which measures 2.2 x 2.5 cm transverse on image 102/2. Other: Omental nodularity is similar to the previous studies, hypermetabolic on PET-CT. Largest component measures up to 5.3 x 1.8 cm on image 99/2. No significant associated ascites. Musculoskeletal: No acute or significant osseous findings. IMPRESSION: 1. No significant change in size of the large complex bilateral adnexal masses consistent with the known history of ovarian cancer. There is increased dependent density within the left-sided lesion. 2. Stable peritoneal carcinomatosis.  No significant ascites. 3. The bilateral pericardiac adenopathy has improved. No progressive thoracic metastatic disease. No pulmonary or osseous metastatic disease. 4. Suspicion of nonocclusive thrombus in the right internal jugular vein related to the right IJ port. Recommend further evaluation with Doppler ultrasound. 5. These results will be called to the ordering clinician or representative by the Radiologist Assistant, and communication documented in the PACS or zVision Dashboard. Electronically Signed   By: Richardean Sale M.D.     Oncologic History:      Right ovarian epithelial cancer ([place])   07/30/2017 Imaging    US pelvis Ultrasound revealed a complex cystic mass in the right adnexa measuring 20 x 11 x 12 cm with multiple internal septations some of which are thick. The left adnexa measured 12.7 x 11.6 x 8.1 with low  level echoes and soft tissue nodules       07/30/2017 Tumor Marker    Patient's tumor was tested for the following markers: CA-125 Results of the tumor marker test revealed 521.3      08/17/2017 Pathology Results    The malignant cells are positive for PAX-8, cytokeratin 7, estrogen receptor, and faintly positive for GATA-3. They are negative for p53, GCDFP, and cytokeratin 20. The finding are consistent with a gynecologic primary carcinoma. Additional studies can be performed upon clinician request.      08/17/2017 Procedure    Technically successful CT-guided left lower quadrant omental mass core biopsy.      08/20/2017 PET scan    1. Cystic masses arising from the pelvis. The nodular component of the RIGHT cystic mass is intensely hypermetabolic consistent with malignant ovarian neoplasm. 2. Extensive hypermetabolic peritoneal thickening in the lower abdomen and upper pelvis, upper abdomen, and upper abdominal precordial fat and paradiaphragmatic fat. 3. Retroperitoneal nodal metastasis adjacent to the IVC at the level the kidneys. 4. No evidence of metastatic disease in the thorax other small effusion on the LEFT and nodal metastasis in the fat superior to the diaphragm. 5. Mild metabolic activity associated the distal esophagus is favored benign esophagitis.      08/20/2017 Imaging    CT chest 1. Bilateral cardiophrenic angle nodal metastasis. No additional findings to suggest metastatic disease to the chest. 2. Small left pleural effusion. 3. Peritoneal carcinomatosis noted within the abdomen. 4. Hepatic steatosis.       08/24/2017 Cancer Staging    Staging form: Ovary, Fallopian Tube, and Primary Peritoneal Carcinoma, AJCC 8th Edition - Clinical: Stage IV (cT3, cN1, cM1) - Signed by [name], MD on 08/24/2017      08/31/2017 Tumor Marker    Patient's  tumor was tested for the following markers: CA-125 Results of the tumor marker test revealed 819.9      09/26/2017 Adverse  Reaction    She developed reaction to Taxol, managed successfully with additional premedications      10/17/2017 Tumor Marker    Patient's tumor was tested for the following markers: CA-125 Results of the tumor marker test revealed 374.4      10/31/2017 Imaging    1. No significant change in size of the large complex bilateral adnexal masses consistent with the known history of ovarian cancer. There is increased dependent density within the left-sided lesion. 2. Stable peritoneal carcinomatosis.  No significant ascites.  3. The bilateral pericardiac adenopathy has improved. No progressive thoracic metastatic disease. No pulmonary or osseous metastatic disease. 4. Suspicion of nonocclusive thrombus in the right internal jugular vein related to the right IJ port. Recommend further evaluation with Doppler ultrasound.      11/13/2017 Surgery    Preoperative Diagnosis: Ovarian cancer s/p neoadjuvant chemotherapy   Procedure(s) Performed:   4. Exploratory laparotomy  5. Bilateral salpingo-oophorectomy with radical tumor debulking for ovarian cancer   including retroperitoneal dissection, lysis of adhesions (enterolysis of small bowel in deep pelvis, left adnexal adhesions to sigmoid and omentum to LLQ) ~1 hour, ureterolysis. 3. Omentectomy  4. Takedown of splenic flexure with removal of omental tumor in left upper quadrant  Specimens: Bilateral tubes and ovaries, omentum, splenic flexure nodule, right paracolic gutter nodule, cecal nodule, right ureteral peritoneal nodule, small bowel mesentary.  Indication for Procedure:  Patient is s/p 3 cycles of chemotherapy with response based on CA125 and decreased mediastinal disease (to <1cm).  Operative Findings:  This represented an optimal cytoreduction with gross residual disease remaining in the deep pelvis near the levator floor and possibly in the region of the right IP ligament/periappendicial region.  Upon entry a large ~20cm right  tube/ovary, mostly cystic. Adherent rind along ileocecal region and appendix. Large cystic left tube/ovary ~10cm with sigmoid colon stretched across the mass and extension of the cystic lesion into the deep pelvis with residual palpable disease deep pelvis near levators. Estimate ~1cm or less on palpation, not visible disease. Omental caking in the LLQ adherent to pelvic sidewall. Omental disease noted RUQ separate. In addition ~1.5cm lesion in splenic flexure omentum. Evidence of prior diaphragmatic disease, no visible disease.         11/13/2017 Pathology Results    1. 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 - TUMOR INVOLVES THE OVARIAN SURFACE. - RIGHT FALLOPIAN TUBE IS INVOLVED. - SEE ONCOLOGY TABLE. - SEE NOTE. 2. Soft tissue mass, biopsy, cecal gutter nodule - METASTATIC MUCINOUS ADENOCARCINOMA. 3. Ovary and fallopian tube, left, left ovary and fallopian tube - METASTATIC MUCINOUS ADENOCARCINOMA TO LEFT OVARY AND FALLOPIAN TUBE. 4. Omentum, resection for tumor - METASTATIC MUCINOUS ADENOCARCINOMA TO OMENTUM. 5. Soft tissue mass, biopsy, splenic flexure nodule - METASTATIC MUCINOUS ADENOCARCINOMA. 6. Soft tissue mass, biopsy, cecal implant - METASTATIC MUCINOUS ADENOCARCINOMA. 7. Soft tissue mass, biopsy, right ureteral peritoneal implant - METASTATIC MUCINOUS ADENOCARCINOMA. 8. Mesentery, small bowel nodule - METASTATIC MUCINOUS ADENOCARCINOMA. Microscopic Comment 1. OVARY or FALLOPIAN TUBE or PRIMARY PERITONEUM: Procedure: Bilateral salpingo-oophorectomy. Specimen Integrity: Intact. Tumor Site: Right ovary. Ovarian Surface Involvement: Present. Fallopian Tube Surface Involvement: Present. Tumor Size: 20 cm. Histologic Type: Mucinous adenocarcinoma. Histologic Grade: Overall G2 (moderately differentiated) (focal areas of poor differentiation are present). Implants: Present. Other Tissue/ Organ Involvement: Left ovary,  left fallopian tube and omentum. Largest Extrapelvic Peritoneal Focus: less than 2 cm. See note Peritoneal/Ascitic Fluid: N/A Treatment Effect: No definite or minimal response identified (chemotherapy response score 1 [CRS 1]) Regional Lymph Nodes No lymph nodes submitted or found Number of Lymph Nodes Examined: 0 Pathologic Stage Classification (pTNM, AJCC 8th Edition): ypT3b, ypNX. Representative Tumor Block: 1D and 1E. (NDK:gt, 11/15/17)      11/19/2017 Genetic Testing    Negative genetic testing on the [employer] Myrisk panel.  The [facility] gene panel offered by [employer] includes sequencing and deletion/duplication testing of the following 35 genes: APC, ATM, AXIN2, BARD1, BMPR1A, BRCA1, BRCA2, BRIP1, CHD1, CDK4, CDKN2A, CHEK2, EPCAM (large rearrangement only), HOXB13, (sequencing only), GALNT12, MLH1, MSH2, MSH3 (excluding repetitive portions of exon 1), MSH6, MUTYH, NBN, NTHL1, PALB2, PMS2, PTEN, RAD51C, RAD51D, RNF43, RPS20, SMAD4, STK11, and TP53. Sequencing was performed for select regions of POLE and POLD1, and large rearrangement analysis was performed for select regions of GREM1. The report date is November 19, 2017.       Current Meds:  Outpatient Encounter Medications as of 11/26/2017  Medication Sig  . acetaminophen (TYLENOL) 500 MG tablet Take 500 mg by mouth every 8 (eight) hours as needed for mild pain or moderate pain.  Marland Kitchen atenolol (TENORMIN) 25 MG tablet Take 25 mg by mouth at bedtime.   Marland Kitchen CALCIUM-MAGNESIUM-VITAMIN D PO Take 1 tablet by mouth daily.  Marland Kitchen enoxaparin (LOVENOX) 40 MG/0.4ML injection Inject 0.4 mLs (40 mg total) into the skin daily.  . famotidine (PEPCID) 20 MG tablet Take 20 mg by mouth as needed for heartburn or indigestion.  . ferrous gluconate (FERGON) 324 MG tablet Take 1 tablet (324 mg total) by mouth 2 (two) times daily with a meal.  . ibuprofen (ADVIL,MOTRIN) 600 MG tablet Take 1 tablet (600 mg total) by mouth every 6 (six) hours as needed for  mild pain.  Marland Kitchen lidocaine-prilocaine (EMLA) cream Apply 1 application topically as needed.  . loperamide (IMODIUM) 2 MG capsule Take 2 capsules (4 mg total) by mouth as needed for diarrhea or loose stools.  Marland Kitchen loratadine (CLARITIN) 10 MG tablet Take 10 mg by mouth daily as needed for allergies.  . metFORMIN (GLUCOPHAGE) 1000 MG tablet Take 500 mg by mouth 2 (two) times daily.   . ondansetron (ZOFRAN) 8 MG tablet Take 1 tablet (8 mg total) by mouth every 8 (eight) hours as needed for nausea.  Marland Kitchen oxyCODONE (OXY IR/ROXICODONE) 5 MG immediate release tablet Take 1 tablet (5 mg total) by mouth every 4 (four) hours as needed for severe pain or breakthrough pain.  Marland Kitchen prochlorperazine (COMPAZINE) 10 MG tablet Take 1 tablet (10 mg total) by mouth every 6 (six) hours as needed for nausea or vomiting.  Marland Kitchen dexamethasone (DECADRON) 4 MG tablet Take 3 tabs the night before chemo and 3 tabs in the morning of chemotherapy, every 3 weeks (Patient not taking: Reported on 11/26/2017)  . Multiple Vitamin (MULTIVITAMIN WITH MINERALS) TABS tablet Take 1 tablet by mouth daily.   No facility-administered encounter medications on file as of 11/26/2017.     Allergy: No Known Allergies  Social Hx:   Tobacco : never Alcohol: 5 EtOH per week Illicit Drug use: none  Past Surgical Hx:  Past Surgical History:  Procedure Laterality Date  . CHOLECYSTECTOMY N/A 07/06/2014   Procedure: LAPAROSCOPIC CHOLECYSTECTOMY;  Surgeon: Aviva Signs Md, MD;  Location: [facility];  Service: General;  Laterality: N/A;  . COLONOSCOPY N/A 06/26/2013   Procedure: COLONOSCOPY;  Surgeon: Rogene Houston, MD;  Location: [facility] SUITE;  Service: Endoscopy;  Laterality: N/A;  1030  . DEBULKING N/A 11/13/2017   Procedure: DEBULKING;  Surgeon: Isabel Caprice, MD;  Location: [facility];  Service: Gynecology;  Laterality: N/A;  . IR FLUORO GUIDE PORT INSERTION RIGHT  09/03/2017  . IR US GUIDE VASC ACCESS RIGHT  09/03/2017  . LAPAROTOMY N/A 11/13/2017   Procedure:  EXPLORATORY LAPAROTOMY;  Surgeon: Isabel Caprice, MD;  Location: [facility]S;  Service: Gynecology;  Laterality: N/A;  . SALPINGOOPHORECTOMY Bilateral 11/13/2017   Procedure: BILATERAL SALPINGO OOPHORECTOMY;  Surgeon: Isabel Caprice, MD;  Location: [facility];  Service: Gynecology;  Laterality: Bilateral;  . VAGINAL HYSTERECTOMY  2011    Past Medical Hx:  Past Medical History:  Diagnosis Date  . Diabetes mellitus without complication ([place])    TYPE 2  . Diverticulosis   . Family history of cancer   . Hypertension   . Osteopenia   . Ovarian cancer ([place])   . Pancreatic cyst     Past Gynecological History:   GYNECOLOGIC HISTORY:  No LMP recorded. Patient has had a hysterectomy. Menarche: 67 years old P 11 LMP 67 years old Contraceptive; yes, <10 years HRT <1 year  Last Pap s/p vaginal hysterectomy 2011 for "prolapse"  Family Hx:  Family History  Problem Relation Age of Onset  . Colon cancer Mother 60       colon ca  . Skin cancer Father 68       unsure type  . Heart attack Father 28       d. 49  . Stroke Maternal Grandmother   . Brain cancer Maternal Grandfather 49  . Dementia Maternal Aunt   . Diabetes Maternal Aunt   . Skin cancer Maternal Uncle   . Heart attack Paternal Aunt   . Fibromyalgia Sister   . Diabetes Sister   . Cancer Other 101       possible ovarian cancer, d. 102  . Prostate cancer Cousin        pat first cousin    Review of Systems:  Review of Systems  Cardiovascular: Positive for leg swelling.  All other systems reviewed and are negative. Chemo induced hair loss  Vitals:  Blood pressure 130/67, pulse 88, temperature 98 F (36.7 C), temperature source Oral, resp. rate 20, height _0  (1.651 m), weight 147 lb 12.8 oz (67 kg), SpO2 100 %. Body mass index is 24.6 kg/m.   Physical Exam: ECOG PERFORMANCE STATUS: 1 - Symptomatic but completely ambulatory  General :  Well developed, 67 y.o., female in no apparent distress HEENT:   Normocephalic/atraumatic, symmetric, EOMI, eyelids normal Neck:   No visible masses. No palpable adenopathy Respiratory:  Respirations unlabored, no use of accessory muscles CV:   Deferred Breast:  Deferred Musculoskeletal: Normal muscle strength.   Abdomen:  Wound CDI. Nontender, nondistended Extremities:  No erythema , no pain , 1+ edema foot/ankles BL Skin:   Normal inspection.  No rash Neuro/Psych:  No focal motor deficit, no abnormal mental status. Normal gait. Normal affect. Alert and oriented to person, place, and time  Genito Urinary deferred today 11/02/17 Vulva: Normal external female genitalia.  Bladder/urethra: Urethral meatus normal in size and location. No lesions or   masses, well supported bladder Bimanual exam: palpably normal vaginal canal and cuff  Adnexa: + fullness in culdesac with nodularity. Feels like it mobilizes off of rectum on rectovaginal 08/03/17: Bimanual exam: palpable fullness but no specific  nodularity Rectovaginal:  Good tone, fullness in the cul-de-sac but no cul de sac nodularity, no parametrial involvement or nodularity.  The rectal wall feels smooth but there is some mild compression anteriorly from the mass in the pelvis which is mildly mobile.  This compression starts at approximately 7-8 cm in from the anal verge.  Oncologic Summary: 1. Ovarian adenocarcinoma  2. Cardiophrenic lymphadenopathy    Assessment/Plan: 1. Treatment tolerance o She had a taxol reaction and was changed to Abraxane o Otherwise tolerated well and she agrees to complete 3 additional chemotherapy cycles 2. Disease response o Seems to have improving CA125 and at least the cardiophrenic nodes have not increased in size and have had some response. The large pelvic masses and omentum don't seem to have had much response o The pathology indicates little response o We discussed the nature of mucinous primaries and their lack of chemo sensitivity. This concerns me re: her long  term response in the adjuvant setting with Carbo/Taxane.  However we have no strong data to alter the remaining upfront treatment course. o GOG 241 study looked at the use of carboplatin/paclitaxel with or without bevacizumab (Avastin) or oxaliplatin/capecitabine with or without bevacizumab in chemotherapy-nave patients with mucinous ovarian cancer, including those with recurrent stage I disease. Unfortunately, the trial was stopped early in 2013 due to poor accrual-50 patients were enrolled-and thus, researchers were unable to make any conclusions. o SGO 2019 annual meeting  - a retrospective report from [place] has suggested an improvement in PFS in patients who received a GI-based treatment (not reached) compared with that of a gynecologic type (26 months; P = .04). Overall survival (OS) was also improved with a GI regimen, with which the median OS was not reached versus 67 months with a gynecologic-based treatment (P = .02). However, the role of bevacizumab in these regimens remained  o Potential targets KRAS/HER2 are suggested in the literature o Given the CA125 improved and the pericardiac node improved I would favor standard Carbo/taxane for the completion of 3 cycles with low threshold to switch to/start a GI based regimen should her CA125 plateau or she recur in less than 6 months. 3. We will present at Tumor conference. 4. In the meantime we need to restart chemotherapy and Dr. Alvy Bimler and I will touch base to come up with a plan as she will be ideally starting by 12/11/17 5. She was accompanied today by her new husband 6. Genetics referral made prior to surgery  ~25 minutes of direct face to face counseling time was spent with the patient. This included discussion about prognosis, therapy recommendations including chemotherapy and pathology interpretation that are beyond the scope of routine postoperative care.   Isabel Caprice, MD  11/27/2017, 12:13 PM  Cc: Sinda Du,  Linna Hoff, [place] (PCP) Gari Crown, MD (Referring OB/Gyn) [name], MD (Medical Oncology)

## 2017-11-28 ENCOUNTER — Other Ambulatory Visit: Payer: Self-pay | Admitting: Hematology and Oncology

## 2017-11-28 DIAGNOSIS — C561 Malignant neoplasm of right ovary: Secondary | ICD-10-CM

## 2017-11-28 NOTE — Progress Notes (Signed)
DISCONTINUE ON PATHWAY REGIMEN - Ovarian     A cycle is every 21 days:     Paclitaxel      Carboplatin   **Always confirm dose/schedule in your pharmacy ordering system**  REASON: Other Reason PRIOR TREATMENT: [id number]: Carboplatin AUC=6 + Paclitaxel 175 mg/m2 q21 Days x 2-4 Cycles TREATMENT RESPONSE: Stable Disease (SD)  START OFF PATHWAY REGIMEN - Ovarian   [id number] (q14d):   A cycle is every 14 days:     Oxaliplatin      Leucovorin      5-Fluorouracil      5-Fluorouracil   **Always confirm dose/schedule in your pharmacy ordering system**  Patient Characteristics: Post-Neoadjuvant Therapy and Cytoreductive Surgery (Neoadjuvant Pathologic Staging), Newly Diagnosed, Continuation Therapy After Neoadjuvant Therapy Therapeutic Status: Post-Neoadjuvant Therapy and Cytoreductive Surgery (Neoadjuvant Pathologic Staging) BRCA Mutation Status: Absent AJCC 8 Stage Grouping: Unknown AJCC M Category: cM0 AJCC T Category: ypT3 AJCC N Category: ypN1 Intent of Therapy: Non-Curative / Palliative Intent, Discussed with Patient

## 2017-11-29 ENCOUNTER — Other Ambulatory Visit: Payer: Self-pay | Admitting: Gynecologic Oncology

## 2017-11-29 DIAGNOSIS — C561 Malignant neoplasm of right ovary: Secondary | ICD-10-CM

## 2017-11-30 ENCOUNTER — Telehealth: Payer: Self-pay | Admitting: Obstetrics

## 2017-11-30 NOTE — Telephone Encounter (Signed)
Faxed medical records to [facility] at [phone], Release ID: [id number]

## 2017-12-03 ENCOUNTER — Other Ambulatory Visit (HOSPITAL_COMMUNITY)
Admission: RE | Admit: 2017-12-03 | Discharge: 2017-12-03 | Disposition: A | Discharge status: Home / Self Care | Payer: Medicare HMO | Source: Ambulatory Visit | Attending: Hematology and Oncology | Admitting: Hematology and Oncology

## 2017-12-03 DIAGNOSIS — R590 Localized enlarged lymph nodes: Secondary | ICD-10-CM | POA: Diagnosis not present

## 2017-12-03 DIAGNOSIS — C561 Malignant neoplasm of right ovary: Secondary | ICD-10-CM | POA: Insufficient documentation

## 2017-12-06 ENCOUNTER — Ambulatory Visit: Payer: Self-pay | Admitting: Genetic Counselor

## 2017-12-06 ENCOUNTER — Telehealth: Payer: Self-pay | Admitting: Genetic Counselor

## 2017-12-06 DIAGNOSIS — C561 Malignant neoplasm of right ovary: Secondary | ICD-10-CM

## 2017-12-06 DIAGNOSIS — Z1379 Encounter for other screening for genetic and chromosomal anomalies: Secondary | ICD-10-CM

## 2017-12-06 NOTE — Progress Notes (Signed)
HPI:  Ms. Quin was previously seen in the [facility] due to a personal and family history of cancer and concerns regarding a hereditary predisposition to cancer. Please refer to our prior cancer genetics clinic note for more information regarding Ms. Summerhill's medical, social and family histories, and our assessment and recommendations, at the time. Ms. Vessel recent genetic test results were disclosed to her, as were recommendations warranted by these results. These results and recommendations are discussed in more detail below.  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 The left adnexa measured 12.7 x 11.6 x 8.1 with low level echoes and soft tissue nodules     07/30/2017 Tumor Marker    Patient's tumor was tested for the following markers: CA-125 Results of the tumor marker test revealed 521.3    08/17/2017 Pathology Results    The malignant cells are positive for PAX-8, cytokeratin 7, estrogen receptor, and faintly positive for GATA-3. They are negative for p53, GCDFP, and cytokeratin 20. The finding are consistent with a gynecologic primary carcinoma. Additional studies can be performed upon clinician request.    08/17/2017 Procedure    Technically successful CT-guided left lower quadrant omental mass core biopsy.    08/20/2017 PET scan    1. Cystic masses arising from the pelvis. The nodular component of the RIGHT cystic mass is intensely hypermetabolic consistent with malignant ovarian neoplasm. 2. Extensive hypermetabolic peritoneal thickening in the lower abdomen and upper pelvis, upper abdomen, and upper abdominal precordial fat and paradiaphragmatic fat. 3.  Retroperitoneal nodal metastasis adjacent to the IVC at the level the kidneys. 4. No evidence of metastatic disease in the thorax other small effusion on the LEFT and nodal metastasis in the fat superior to the diaphragm. 5. Mild metabolic activity associated the distal esophagus is favored benign esophagitis.    08/20/2017 Imaging    CT chest 1. Bilateral cardiophrenic angle nodal metastasis. No additional findings to suggest metastatic disease to the chest. 2. Small left pleural effusion. 3. Peritoneal carcinomatosis noted within the abdomen. 4. Hepatic steatosis.     08/24/2017 Cancer Staging    Staging form: Ovary, Fallopian Tube, and Primary Peritoneal Carcinoma, AJCC 8th Edition - Clinical: Stage IV (cT3, cN1, cM1) - Signed by [name], MD on 08/24/2017    08/31/2017 Tumor Marker    Patient's tumor was tested for the following markers: CA-125 Results of the tumor marker test revealed 819.9    09/26/2017 Adverse Reaction    She developed reaction to Taxol, managed successfully with additional premedications    10/17/2017 Tumor Marker    Patient's tumor was tested for the following markers: CA-125 Results of the tumor marker test revealed 374.4    10/31/2017 Imaging    1. No significant change in size of the large complex bilateral adnexal masses consistent with the known history of ovarian cancer. There is increased dependent density within the left-sided lesion. 2. Stable peritoneal carcinomatosis.  No significant ascites.  3. The bilateral pericardiac adenopathy has improved. No progressive thoracic metastatic disease. No pulmonary or osseous metastatic disease. 4. Suspicion of nonocclusive thrombus in the right internal jugular vein related to the right IJ  port. Recommend further evaluation with Doppler ultrasound.    11/13/2017 Surgery    Preoperative Diagnosis: Ovarian cancer s/p neoadjuvant chemotherapy   Procedure(s) Performed:   1. Exploratory laparotomy  2. Bilateral  salpingo-oophorectomy with radical tumor debulking for ovarian cancer   including retroperitoneal dissection, lysis of adhesions (enterolysis of small bowel in deep pelvis, left adnexal adhesions to sigmoid and omentum to LLQ) ~1 hour, ureterolysis. 3. Omentectomy  4. Takedown of splenic flexure with removal of omental tumor in left upper quadrant  Specimens: Bilateral tubes and ovaries, omentum, splenic flexure nodule, right paracolic gutter nodule, cecal nodule, right ureteral peritoneal nodule, small bowel mesentary.  Indication for Procedure:  Patient is s/p 3 cycles of chemotherapy with response based on CA125 and decreased mediastinal disease (to <1cm).  Operative Findings:  This represented an optimal cytoreduction with gross residual disease remaining in the deep pelvis near the levator floor and possibly in the region of the right IP ligament/periappendicial region.  Upon entry a large ~20cm right tube/ovary, mostly cystic. Adherent rind along ileocecal region and appendix. Large cystic left tube/ovary ~10cm with sigmoid colon stretched across the mass and extension of the cystic lesion into the deep pelvis with residual palpable disease deep pelvis near levators. Estimate ~1cm or less on palpation, not visible disease. Omental caking in the LLQ adherent to pelvic sidewall. Omental disease noted RUQ separate. In addition ~1.5cm lesion in splenic flexure omentum. Evidence of prior diaphragmatic disease, no visible disease.       11/13/2017 Pathology Results    1. 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 - TUMOR INVOLVES THE OVARIAN SURFACE. - RIGHT FALLOPIAN TUBE IS INVOLVED. - SEE ONCOLOGY TABLE. - SEE NOTE. 2. Soft tissue mass, biopsy, cecal gutter nodule - METASTATIC MUCINOUS ADENOCARCINOMA. 3. Ovary and fallopian tube, left, left ovary and fallopian tube - METASTATIC MUCINOUS ADENOCARCINOMA TO LEFT OVARY AND FALLOPIAN  TUBE. 4. Omentum, resection for tumor - METASTATIC MUCINOUS ADENOCARCINOMA TO OMENTUM. 5. Soft tissue mass, biopsy, splenic flexure nodule - METASTATIC MUCINOUS ADENOCARCINOMA. 6. Soft tissue mass, biopsy, cecal implant - METASTATIC MUCINOUS ADENOCARCINOMA. 7. Soft tissue mass, biopsy, right ureteral peritoneal implant - METASTATIC MUCINOUS ADENOCARCINOMA. 8. Mesentery, small bowel nodule - METASTATIC MUCINOUS ADENOCARCINOMA. Microscopic Comment 1. OVARY or FALLOPIAN TUBE or PRIMARY PERITONEUM: Procedure: Bilateral salpingo-oophorectomy. Specimen Integrity: Intact. Tumor Site: Right ovary. Ovarian Surface Involvement: Present. Fallopian Tube Surface Involvement: Present. Tumor Size: 20 cm. Histologic Type: Mucinous adenocarcinoma. Histologic Grade: Overall G2 (moderately differentiated) (focal areas of poor differentiation are present). Implants: Present. Other Tissue/ Organ Involvement: Left ovary, left fallopian tube and omentum. Largest Extrapelvic Peritoneal Focus: less than 2 cm. See note Peritoneal/Ascitic Fluid: N/A Treatment Effect: No definite or minimal response identified (chemotherapy response score 1 [CRS 1]) Regional Lymph Nodes No lymph nodes submitted or found Number of Lymph Nodes Examined: 0 Pathologic Stage Classification (pTNM, AJCC 8th Edition): ypT3b, ypNX. Representative Tumor Block: 1D and 1E. (NDK:gt, 11/15/17)    11/19/2017 Genetic Testing    Negative genetic testing on the [facility] Myrisk panel.  The [facility] gene panel offered by [employer] includes sequencing and deletion/duplication testing of the following 35 genes: APC, ATM, AXIN2, BARD1, BMPR1A, BRCA1, BRCA2, BRIP1, CHD1, CDK4, CDKN2A, CHEK2, EPCAM (large rearrangement only), HOXB13, (sequencing only), GALNT12, MLH1, MSH2, MSH3 (excluding repetitive portions of exon 1), MSH6, MUTYH, NBN, NTHL1, PALB2, PMS2, PTEN, RAD51C, RAD51D, RNF43, RPS20, SMAD4, STK11, and TP53. Sequencing was  performed for select regions of  POLE and POLD1, and large rearrangement analysis was performed for select regions of GREM1. The report date is November 19, 2017.  HRD tumor results indicate a BRCA1 mutation identified in the ovarian tumor causing genomic instability. The report date is 12/04/2017     12/11/2017 -  Chemotherapy    The patient had PALONOSETRON HCL INJECTION 0.25 MG/5ML, 0.25 mg, Intravenous,  Once, 0 of 4 cycles leucovorin 700 mg in dextrose 5 % 250 mL infusion, 400 mg/m2, Intravenous,  Once, 0 of 4 cycles oxaliplatin (ELOXATIN) 150 mg in dextrose 5 % 500 mL chemo infusion, 85 mg/m2, Intravenous,  Once, 0 of 4 cycles fluorouracil (ADRUCIL) chemo injection 700 mg, 400 mg/m2, Intravenous,  Once, 0 of 4 cycles fluorouracil (ADRUCIL) 4,200 mg in sodium chloride 0.9 % 66 mL chemo infusion, 2,400 mg/m2, Intravenous, 1 Day/Dose, 0 of 4 cycles  for chemotherapy treatment.      Genetic Testing    Patient has genetic testing done for ER/PR and Her2/neu. Results revealed patient has the following: ER 15-20% PR 5-10% Her2/neu - negative     Genetic Testing    Patient has genetic testing done for MMR. Results revealed patient has the following mutation(s): MMR normal     FAMILY HISTORY:  We obtained a detailed, 4-generation family history.  Significant diagnoses are listed below: Family History  Problem Relation Age of Onset  . Colon cancer Mother 25       colon ca  . Skin cancer Father 59       unsure type  . Heart attack Father 14       d. 73  . Stroke Maternal Grandmother   . Brain cancer Maternal Grandfather 70  . Dementia Maternal Aunt   . Diabetes Maternal Aunt   . Skin cancer Maternal Uncle   . Heart attack Paternal Aunt   . Fibromyalgia Sister   . Diabetes Sister   . Cancer Other 101       possible ovarian cancer, d. 102  . Prostate cancer Cousin        pat first cousin    The patient has two sons who are cancer free.  She ha three sisters and a brother who have  never had cancer.  Her father is deceased from a heart attack at 23 and her mother is living at 38.  The patient's mother had colon cancer at 2. She also had a benign tumor removed from her abdomen, necessitating a TAH-BSO in her 30's.  She has three sisters and four brothers.  One brother had skin cancer and one sister died of Lewy-Body dementia.  The maternal grandparents are both deceased.  The grandfather had a brain stem tumor and the grandmother had strokes.  The grandmother had a mother who at 40 had an abdominal cancer, possibly ovarian.  The patient's father had skin cancer in his 18's.  He died at 71 from a heart attack.  He had one sister who died of heart disease.  She had a benign tumor removed from her abdomen.  The paternal grandparents died before the patient was born.  Ms. Tien is unaware of previous family history of genetic testing for hereditary cancer risks. Patient's maternal ancestors are of [place] and [place] descent, and paternal ancestors are of [place] descent. There is no reported Ashkenazi Jewish ancestry. There is no known consanguinity.  GENETIC TEST RESULTS: Genetic testing reported out on November 19, 2017 through the [facility] cancer panel found no deleterious mutations.  The [facility]  gene panel offered by [employer] includes sequencing and deletion/duplication testing of the following 35 genes: APC, ATM, AXIN2, BARD1, BMPR1A, BRCA1, BRCA2, BRIP1, CHD1, CDK4, CDKN2A, CHEK2, EPCAM (large rearrangement only), HOXB13, (sequencing only), GALNT12, MLH1, MSH2, MSH3 (excluding repetitive portions of exon 1), MSH6, MUTYH, NBN, NTHL1, PALB2, PMS2, PTEN, RAD51C, RAD51D, RNF43, RPS20, SMAD4, STK11, and TP53. Sequencing was performed for select regions of POLE and POLD1, and large rearrangement analysis was performed for select regions of GREM1.  The test report has been scanned into EPIC and is located under the Molecular Pathology section of the Results Review  tab.    We discussed with Ms. Chronister that since the current genetic testing is not perfect, it is possible there may be a gene mutation in one of these genes that current testing cannot detect, but that chance is small.  We also discussed, that it is possible that another gene that has not yet been discovered, or that we have not yet tested, is responsible for the cancer diagnoses in the family, and it is, therefore, important to remain in touch with cancer genetics in the future so that we can continue to offer Ms. Pellot the most up to date genetic testing.   HRD testing identified a BRCA1 mutation within the ovarian tumor.  This suggests that her tumor may have a good response to PARP inhibitors.  We recommended that she talk with her physician, Dr. Alvy Bimler, about this option.    CANCER SCREENING RECOMMENDATIONS: This result is reassuring and indicates that Ms. Marsolek likely does not have an increased risk for a future cancer due to a mutation in one of these genes. This normal test also suggests that Ms. Snapp's cancer was most likely not due to an inherited predisposition associated with one of these genes.  Most cancers happen by chance and this negative test suggests that her cancer falls into this category.  We, therefore, recommended she continue to follow the cancer management and screening guidelines provided by her oncology and primary healthcare provider.   An individual's cancer risk and medical management are not determined by genetic test results alone. Overall cancer risk assessment incorporates additional factors, including personal medical history, family history, and any available genetic information that may result in a personalized plan for cancer prevention and surveillance.  RECOMMENDATIONS FOR FAMILY MEMBERS:  Women in this family might be at some increased risk of developing cancer, over the general population risk, simply due to the family history of cancer.  We recommended women  in this family have a yearly mammogram beginning at age 52, or 56 years younger than the earliest onset of cancer, an annual clinical breast exam, and perform monthly breast self-exams. Women in this family should also have a gynecological exam as recommended by their primary provider. All family members should have a colonoscopy by age 19.  FOLLOW-UP: Lastly, we discussed with Ms. Sublette that cancer genetics is a rapidly advancing field and it is possible that new genetic tests will be appropriate for her and/or her family members in the future. We encouraged her to remain in contact with cancer genetics on an annual basis so we can update her personal and family histories and let her know of advances in cancer genetics that may benefit this family.   Our contact number was provided. Ms. Kannan questions were answered to her satisfaction, and she knows she is welcome to call us at anytime with additional questions or concerns.   Roma Kayser, MS, [place]  Certified [employer] [email or web address]

## 2017-12-06 NOTE — Telephone Encounter (Signed)
Explained that there is a BRCA1 mutation in her ovarian tumor, but that she was neg in her germline.  This is not hereditary, but somatic.  This test indicates that she should respond favorably to PARP inhibitors if needed.  Please discuss further with Dr. Alvy Bimler.

## 2017-12-07 ENCOUNTER — Encounter: Payer: Self-pay | Admitting: Pharmacist

## 2017-12-10 ENCOUNTER — Telehealth: Payer: Self-pay | Admitting: Hematology and Oncology

## 2017-12-10 ENCOUNTER — Ambulatory Visit (HOSPITAL_COMMUNITY)
Admission: RE | Admit: 2017-12-10 | Discharge: 2017-12-10 | Disposition: A | Discharge status: Home / Self Care | Payer: Medicare HMO | Source: Ambulatory Visit | Attending: Hematology and Oncology | Admitting: Hematology and Oncology

## 2017-12-10 ENCOUNTER — Inpatient Hospital Stay: Payer: Medicare HMO

## 2017-12-10 ENCOUNTER — Ambulatory Visit (HOSPITAL_COMMUNITY): Payer: Medicare HMO

## 2017-12-10 DIAGNOSIS — C569 Malignant neoplasm of unspecified ovary: Secondary | ICD-10-CM | POA: Diagnosis not present

## 2017-12-10 DIAGNOSIS — C561 Malignant neoplasm of right ovary: Secondary | ICD-10-CM | POA: Diagnosis not present

## 2017-12-10 DIAGNOSIS — C771 Secondary and unspecified malignant neoplasm of intrathoracic lymph nodes: Secondary | ICD-10-CM | POA: Diagnosis not present

## 2017-12-10 DIAGNOSIS — Z90722 Acquired absence of ovaries, bilateral: Secondary | ICD-10-CM | POA: Diagnosis not present

## 2017-12-10 DIAGNOSIS — R590 Localized enlarged lymph nodes: Secondary | ICD-10-CM

## 2017-12-10 DIAGNOSIS — Z9221 Personal history of antineoplastic chemotherapy: Secondary | ICD-10-CM | POA: Diagnosis not present

## 2017-12-10 DIAGNOSIS — Z90710 Acquired absence of both cervix and uterus: Secondary | ICD-10-CM | POA: Diagnosis not present

## 2017-12-10 LAB — CBC WITH DIFFERENTIAL (CANCER CENTER ONLY)
Basophils Absolute: 0 10*3/uL (ref 0.0–0.1)
Basophils Relative: 1 %
Eosinophils Absolute: 0.3 10*3/uL (ref 0.0–0.5)
Eosinophils Relative: 6 %
HEMATOCRIT: 37.9 % (ref 34.8–46.6)
HEMOGLOBIN: 12.2 g/dL (ref 11.6–15.9)
LYMPHS ABS: 2.6 10*3/uL (ref 0.9–3.3)
Lymphocytes Relative: 43 %
MCH: 30.7 pg (ref 25.1–34.0)
MCHC: 32.2 g/dL (ref 31.5–36.0)
MCV: 95.2 fL (ref 79.5–101.0)
MONOS PCT: 7 %
Monocytes Absolute: 0.4 10*3/uL (ref 0.1–0.9)
NEUTROS ABS: 2.5 10*3/uL (ref 1.5–6.5)
NEUTROS PCT: 43 %
Platelet Count: 236 10*3/uL (ref 145–400)
RBC: 3.98 MIL/uL (ref 3.70–5.45)
RDW: 14.6 % — ABNORMAL HIGH (ref 11.2–14.5)
WBC: 5.8 10*3/uL (ref 3.9–10.3)

## 2017-12-10 LAB — CMP (CANCER CENTER ONLY)
ALBUMIN: 3.8 g/dL (ref 3.5–5.0)
ALK PHOS: 53 U/L (ref 38–126)
ALT: 19 U/L (ref 0–44)
AST: 23 U/L (ref 15–41)
Anion gap: 8 (ref 5–15)
BILIRUBIN TOTAL: 0.3 mg/dL (ref 0.3–1.2)
BUN: 15 mg/dL (ref 8–23)
CALCIUM: 9.6 mg/dL (ref 8.9–10.3)
CO2: 28 mmol/L (ref 22–32)
Chloride: 104 mmol/L (ref 98–111)
Creatinine: 0.81 mg/dL (ref 0.44–1.00)
GFR, Estimated: >60 mL/min (ref >60)
Glucose, Bld: 135 mg/dL — ABNORMAL HIGH (ref 70–99)
Potassium: 4 mmol/L (ref 3.5–5.1)
SODIUM: 140 mmol/L (ref 135–145)
Total Protein: 8.3 g/dL — ABNORMAL HIGH (ref 6.5–8.1)

## 2017-12-10 MED ORDER — IOHEXOL 300 MG/ML  SOLN
100.0000 mL | Freq: Once | INTRAMUSCULAR | Status: AC | PRN
  Administered 2017-12-10: 100 mL via INTRAVENOUS

## 2017-12-10 MED ORDER — IOPAMIDOL (ISOVUE-300) INJECTION 61%
30.0000 mL | Freq: Once | INTRAVENOUS | Status: AC | PRN
  Administered 2017-12-10: 30 mL via ORAL

## 2017-12-10 MED ORDER — IOPAMIDOL (ISOVUE-300) INJECTION 61%
INTRAVENOUS | Status: AC
  Filled 2017-12-10: qty 30

## 2017-12-11 ENCOUNTER — Inpatient Hospital Stay: Payer: Medicare HMO

## 2017-12-11 ENCOUNTER — Inpatient Hospital Stay (HOSPITAL_BASED_OUTPATIENT_CLINIC_OR_DEPARTMENT_OTHER): Payer: Medicare HMO | Admitting: Hematology and Oncology

## 2017-12-11 ENCOUNTER — Encounter: Payer: Self-pay | Admitting: Hematology and Oncology

## 2017-12-11 DIAGNOSIS — Z90710 Acquired absence of both cervix and uterus: Secondary | ICD-10-CM | POA: Diagnosis not present

## 2017-12-11 DIAGNOSIS — C561 Malignant neoplasm of right ovary: Secondary | ICD-10-CM | POA: Diagnosis not present

## 2017-12-11 DIAGNOSIS — C771 Secondary and unspecified malignant neoplasm of intrathoracic lymph nodes: Secondary | ICD-10-CM

## 2017-12-11 DIAGNOSIS — R590 Localized enlarged lymph nodes: Secondary | ICD-10-CM

## 2017-12-11 DIAGNOSIS — Z9221 Personal history of antineoplastic chemotherapy: Secondary | ICD-10-CM | POA: Diagnosis not present

## 2017-12-11 DIAGNOSIS — Z1379 Encounter for other screening for genetic and chromosomal anomalies: Secondary | ICD-10-CM

## 2017-12-11 DIAGNOSIS — Z90722 Acquired absence of ovaries, bilateral: Secondary | ICD-10-CM | POA: Diagnosis not present

## 2017-12-11 DIAGNOSIS — G620 Drug-induced polyneuropathy: Secondary | ICD-10-CM

## 2017-12-11 DIAGNOSIS — E119 Type 2 diabetes mellitus without complications: Secondary | ICD-10-CM

## 2017-12-11 DIAGNOSIS — T451X5A Adverse effect of antineoplastic and immunosuppressive drugs, initial encounter: Secondary | ICD-10-CM

## 2017-12-11 DIAGNOSIS — Z7189 Other specified counseling: Secondary | ICD-10-CM | POA: Insufficient documentation

## 2017-12-11 LAB — CA 125: Cancer Antigen (CA) 125: 94.9 U/mL — ABNORMAL HIGH (ref 0.0–38.1)

## 2017-12-11 MED ORDER — DEXTROSE 5 % IV SOLN
Freq: Once | INTRAVENOUS | Status: AC
  Administered 2017-12-11: 12:00:00 via INTRAVENOUS
  Filled 2017-12-11: qty 250

## 2017-12-11 MED ORDER — DEXAMETHASONE SODIUM PHOSPHATE 10 MG/ML IJ SOLN
10.0000 mg | Freq: Once | INTRAMUSCULAR | Status: AC
  Administered 2017-12-11: 10 mg via INTRAVENOUS

## 2017-12-11 MED ORDER — DEXAMETHASONE SODIUM PHOSPHATE 10 MG/ML IJ SOLN
INTRAMUSCULAR | Status: AC
  Filled 2017-12-11: qty 1

## 2017-12-11 MED ORDER — PALONOSETRON HCL INJECTION 0.25 MG/5ML
INTRAVENOUS | Status: AC
  Filled 2017-12-11: qty 5

## 2017-12-11 MED ORDER — OXALIPLATIN CHEMO INJECTION 100 MG/20ML
68.0000 mg/m2 | Freq: Once | INTRAVENOUS | Status: AC
  Administered 2017-12-11: 120 mg via INTRAVENOUS
  Filled 2017-12-11: qty 24

## 2017-12-11 MED ORDER — LEUCOVORIN CALCIUM INJECTION 350 MG
400.0000 mg/m2 | Freq: Once | INTRAVENOUS | Status: AC
  Administered 2017-12-11: 700 mg via INTRAVENOUS
  Filled 2017-12-11: qty 25

## 2017-12-11 MED ORDER — FLUOROURACIL CHEMO INJECTION 500 MG/10ML
200.0000 mg/m2 | Freq: Once | INTRAVENOUS | Status: AC
  Administered 2017-12-11: 350 mg via INTRAVENOUS
  Filled 2017-12-11: qty 7

## 2017-12-11 MED ORDER — SODIUM CHLORIDE 0.9 % IV SOLN
2400.0000 mg/m2 | INTRAVENOUS | Status: DC
  Administered 2017-12-11: 4200 mg via INTRAVENOUS
  Filled 2017-12-11: qty 84

## 2017-12-11 MED ORDER — PALONOSETRON HCL INJECTION 0.25 MG/5ML
0.2500 mg | Freq: Once | INTRAVENOUS | Status: AC
  Administered 2017-12-11: 0.25 mg via INTRAVENOUS

## 2017-12-11 NOTE — Assessment & Plan Note (Signed)
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

## 2017-12-11 NOTE — Progress Notes (Signed)
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[facility]) I reviewed multiple imaging studies with the patient and her husband I have given her copies of CT report and blood work She has elevated tumor marker and signs of persistent disease on recent imaging studies I also gave her a copy of the pathology report and recent genetic testing I recommend we switch her treatment to FOLFOX regimen given minimum response to prior therapy, reaction to Taxol and subtype of disease I recommend 4 cycles of treatment before repeat imaging study I recommend follow-up tumor marker monitoring once a month Given residual neuropathy, I recommend minor dose adjustment of oxaliplatin.  I will reduce the dose of 5-FU bolus by 50% as well due to anticipated risk of pancytopenia The risk, benefits, side effects of treatment were discussed with the patient and her husband and she agreed to proceed  Type 2 diabetes mellitus treated without insulin ([place]) We discussed dietary modification and close monitoring of blood sugar  Peripheral neuropathy due to chemotherapy [facility]) She has residual peripheral neuropathy from prior treatment I plan to reduce the dose of oxaliplatin a little bit  Genetic testing She has positive genetic testing She would qualify for PARP inhibitor as maintenance treatment in the future  Goals of care, counseling/discussion The patient is aware she has incurable disease and treatment is strictly palliative. We discussed importance of Advanced Directives and Living will.   No orders of the defined types were placed in this encounter.   INTERVAL HISTORY: Please see below for problem oriented charting. She returns with her husband to review test results She is recovering well from surgery She has mild residual peripheral neuropathy from prior treatment Leg  swelling has resolved Her appetite is stable She denies recent changes in bowel habits Her incisional wound is healing well   SUMMARY OF ONCOLOGIC HISTORY: Oncology History   ER 15-20%, PR 5-10%, Her2/neu negative MMR normal Negative genetic testing HRD pos BRCA1 positive     Right ovarian epithelial cancer ([place])   07/30/2017 Imaging    US pelvis Ultrasound revealed a complex cystic mass in the right adnexa measuring 20 x 11 x 12 cm with multiple internal septations some of which are thick. The left adnexa measured 12.7 x 11.6 x 8.1 with low level echoes and soft tissue nodules     07/30/2017 Tumor Marker    Patient's tumor was tested for the following markers: CA-125 Results of the tumor marker test revealed 521.3    08/17/2017 Pathology Results    The malignant cells are positive for PAX-8, cytokeratin 7, estrogen receptor, and faintly positive for GATA-3. They are negative for p53, GCDFP, and cytokeratin 20. The finding are consistent with a gynecologic primary carcinoma. Additional studies can be performed upon clinician request.    08/17/2017 Procedure    Technically successful CT-guided left lower quadrant omental mass core biopsy.    08/20/2017 PET scan    1. Cystic masses arising from the pelvis. The nodular component of the RIGHT cystic mass is intensely hypermetabolic consistent with malignant ovarian neoplasm. 2. Extensive hypermetabolic peritoneal thickening in the lower abdomen and upper pelvis, upper abdomen, and upper abdominal precordial fat and paradiaphragmatic fat. 3. Retroperitoneal nodal metastasis adjacent to the IVC at the level the kidneys. 4. No evidence of metastatic disease in the thorax other small effusion on the LEFT and nodal metastasis in  the fat superior to the diaphragm. 5. Mild metabolic activity associated the distal esophagus is favored benign esophagitis.    08/20/2017 Imaging    CT chest 1. Bilateral cardiophrenic angle nodal metastasis. No  additional findings to suggest metastatic disease to the chest. 2. Small left pleural effusion. 3. Peritoneal carcinomatosis noted within the abdomen. 4. Hepatic steatosis.     08/24/2017 Cancer Staging    Staging form: Ovary, Fallopian Tube, and Primary Peritoneal Carcinoma, AJCC 8th Edition - Clinical: Stage IV (cT3, cN1, cM1) - Signed by [name], MD on 08/24/2017    08/31/2017 Tumor Marker    Patient's tumor was tested for the following markers: CA-125 Results of the tumor marker test revealed 819.9    09/26/2017 Adverse Reaction    She developed reaction to Taxol, managed successfully with additional premedications    10/17/2017 Tumor Marker    Patient's tumor was tested for the following markers: CA-125 Results of the tumor marker test revealed 374.4    10/31/2017 Imaging    1. No significant change in size of the large complex bilateral adnexal masses consistent with the known history of ovarian cancer. There is increased dependent density within the left-sided lesion. 2. Stable peritoneal carcinomatosis.  No significant ascites.  3. The bilateral pericardiac adenopathy has improved. No progressive thoracic metastatic disease. No pulmonary or osseous metastatic disease. 4. Suspicion of nonocclusive thrombus in the right internal jugular vein related to the right IJ port. Recommend further evaluation with Doppler ultrasound.    11/13/2017 Surgery    Preoperative Diagnosis: Ovarian cancer s/p neoadjuvant chemotherapy   Procedure(s) Performed:   1. Exploratory laparotomy  2. Bilateral salpingo-oophorectomy with radical tumor debulking for ovarian cancer   including retroperitoneal dissection, lysis of adhesions (enterolysis of small bowel in deep pelvis, left adnexal adhesions to sigmoid and omentum to LLQ) ~1 hour, ureterolysis. 3. Omentectomy  4. Takedown of splenic flexure with removal of omental tumor in left upper quadrant  Specimens: Bilateral tubes and ovaries, omentum,  splenic flexure nodule, right paracolic gutter nodule, cecal nodule, right ureteral peritoneal nodule, small bowel mesentary.  Indication for Procedure:  Patient is s/p 3 cycles of chemotherapy with response based on CA125 and decreased mediastinal disease (to <1cm).  Operative Findings:  This represented an optimal cytoreduction with gross residual disease remaining in the deep pelvis near the levator floor and possibly in the region of the right IP ligament/periappendicial region.  Upon entry a large ~20cm right tube/ovary, mostly cystic. Adherent rind along ileocecal region and appendix. Large cystic left tube/ovary ~10cm with sigmoid colon stretched across the mass and extension of the cystic lesion into the deep pelvis with residual palpable disease deep pelvis near levators. Estimate ~1cm or less on palpation, not visible disease. Omental caking in the LLQ adherent to pelvic sidewall. Omental disease noted RUQ separate. In addition ~1.5cm lesion in splenic flexure omentum. Evidence of prior diaphragmatic disease, no visible disease.       11/13/2017 Pathology Results    1. 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 - TUMOR INVOLVES THE OVARIAN SURFACE. - RIGHT FALLOPIAN TUBE IS INVOLVED. - SEE ONCOLOGY TABLE. - SEE NOTE. 2. Soft tissue mass, biopsy, cecal gutter nodule - METASTATIC MUCINOUS ADENOCARCINOMA. 3. Ovary and fallopian tube, left, left ovary and fallopian tube - METASTATIC MUCINOUS ADENOCARCINOMA TO LEFT OVARY AND FALLOPIAN TUBE. 4. Omentum, resection for tumor - METASTATIC MUCINOUS ADENOCARCINOMA TO OMENTUM. 5. Soft tissue mass, biopsy, splenic flexure nodule -  METASTATIC MUCINOUS ADENOCARCINOMA. 6. Soft tissue mass, biopsy, cecal implant - METASTATIC MUCINOUS ADENOCARCINOMA. 7. Soft tissue mass, biopsy, right ureteral peritoneal implant - METASTATIC MUCINOUS ADENOCARCINOMA. 8. Mesentery, small bowel nodule -  METASTATIC MUCINOUS ADENOCARCINOMA. Microscopic Comment 1. OVARY or FALLOPIAN TUBE or PRIMARY PERITONEUM: Procedure: Bilateral salpingo-oophorectomy. Specimen Integrity: Intact. Tumor Site: Right ovary. Ovarian Surface Involvement: Present. Fallopian Tube Surface Involvement: Present. Tumor Size: 20 cm. Histologic Type: Mucinous adenocarcinoma. Histologic Grade: Overall G2 (moderately differentiated) (focal areas of poor differentiation are present). Implants: Present. Other Tissue/ Organ Involvement: Left ovary, left fallopian tube and omentum. Largest Extrapelvic Peritoneal Focus: less than 2 cm. See note Peritoneal/Ascitic Fluid: N/A Treatment Effect: No definite or minimal response identified (chemotherapy response score 1 [CRS 1]) Regional Lymph Nodes No lymph nodes submitted or found Number of Lymph Nodes Examined: 0 Pathologic Stage Classification (pTNM, AJCC 8th Edition): ypT3b, ypNX. Representative Tumor Block: 1D and 1E. (NDK:gt, 11/15/17)    11/19/2017 Genetic Testing    Negative genetic testing on the [employer] Myrisk panel.  The [facility] gene panel offered by [employer] includes sequencing and deletion/duplication testing of the following 35 genes: APC, ATM, AXIN2, BARD1, BMPR1A, BRCA1, BRCA2, BRIP1, CHD1, CDK4, CDKN2A, CHEK2, EPCAM (large rearrangement only), HOXB13, (sequencing only), GALNT12, MLH1, MSH2, MSH3 (excluding repetitive portions of exon 1), MSH6, MUTYH, NBN, NTHL1, PALB2, PMS2, PTEN, RAD51C, RAD51D, RNF43, RPS20, SMAD4, STK11, and TP53. Sequencing was performed for select regions of POLE and POLD1, and large rearrangement analysis was performed for select regions of GREM1. The report date is November 19, 2017.  HRD tumor results indicate a BRCA1 mutation identified in the ovarian tumor causing genomic instability. The report date is 12/04/2017      Genetic Testing    Patient has genetic testing done for ER/PR and Her2/neu. Results revealed patient has  the following: ER 15-20% PR 5-10% Her2/neu - negative     Genetic Testing    Patient has genetic testing done for MMR. Results revealed patient has the following mutation(s): MMR normal    12/10/2017 Imaging    1. Interval resection of large bilateral adnexal cystic masses. Residual soft tissue/tumor within bilateral adnexal regions as above. 2. Interval resolution of previously noted omental cake which may reflect interval omentectomy. There is a new peritoneal deposit identified within the left upper quadrant of the abdomen involving the anterior surface of the spleen.    12/10/2017 Tumor Marker    Patient's tumor was tested for the following markers: CA-125 Results of the tumor marker test revealed 94.9     REVIEW OF SYSTEMS:   Constitutional: Denies fevers, chills or abnormal weight loss Eyes: Denies blurriness of vision Ears, nose, mouth, throat, and face: Denies mucositis or sore throat Respiratory: Denies cough, dyspnea or wheezes Cardiovascular: Denies palpitation, chest discomfort or lower extremity swelling Gastrointestinal:  Denies nausea, heartburn or change in bowel habits Skin: Denies abnormal skin rashes Lymphatics: Denies new lymphadenopathy or easy bruising Neurological:Denies numbness, tingling or new weaknesses Behavioral/Psych: Mood is stable, no new changes  All other systems were reviewed with the patient and are negative.  I have reviewed the past medical history, past surgical history, social history and family history with the patient and they are unchanged from previous note.  ALLERGIES:  has No Known Allergies.  MEDICATIONS:  Current Outpatient Medications  Medication Sig Dispense Refill  . acetaminophen (TYLENOL) 500 MG tablet Take 500 mg by mouth every 8 (eight) hours as needed for mild pain or moderate pain.    Marland Kitchen  atenolol (TENORMIN) 25 MG tablet Take 25 mg by mouth at bedtime.     Marland Kitchen CALCIUM-MAGNESIUM-VITAMIN D PO Take 1 tablet by mouth daily.     . famotidine (PEPCID) 20 MG tablet Take 20 mg by mouth as needed for heartburn or indigestion.    . lidocaine-prilocaine (EMLA) cream Apply 1 application topically as needed. 30 g 6  . loratadine (CLARITIN) 10 MG tablet Take 10 mg by mouth daily as needed for allergies.    . metFORMIN (GLUCOPHAGE) 1000 MG tablet Take 500 mg by mouth 2 (two) times daily.   0  . Multiple Vitamin (MULTIVITAMIN WITH MINERALS) TABS tablet Take 1 tablet by mouth daily.    . ondansetron (ZOFRAN) 8 MG tablet Take 1 tablet (8 mg total) by mouth every 8 (eight) hours as needed for nausea. 30 tablet 3  . prochlorperazine (COMPAZINE) 10 MG tablet Take 1 tablet (10 mg total) by mouth every 6 (six) hours as needed for nausea or vomiting. 30 tablet 0   No current facility-administered medications for this visit.     PHYSICAL EXAMINATION: ECOG PERFORMANCE STATUS: 1 - Symptomatic but completely ambulatory  Vitals:   12/11/17 1007  BP: (!) 143/76  Pulse: 82  Resp: 18  Temp: (!) 97.5 F (36.4 C)  SpO2: 100%   Filed Weights   12/11/17 1007  Weight: 135 lb 9.6 oz (61.5 kg)    GENERAL:alert, no distress and comfortable SKIN: skin color, texture, turgor are normal, no rashes or significant lesions EYES: normal, Conjunctiva are pink and non-injected, sclera clear OROPHARYNX:no exudate, no erythema and lips, buccal mucosa, and tongue normal  NECK: supple, thyroid normal size, non-tender, without nodularity LYMPH:  no palpable lymphadenopathy in the cervical, axillary or inguinal LUNGS: clear to auscultation and percussion with normal breathing effort HEART: regular rate & rhythm and no murmurs and no lower extremity edema ABDOMEN:abdomen soft, non-tender and normal bowel sounds.  She has well-healed surgical scar Musculoskeletal:no cyanosis of digits and no clubbing  NEURO: alert & oriented x 3 with fluent speech, no focal motor/sensory deficits  LABORATORY DATA:  I have reviewed the data as listed    Component  Value Date/Time   NA 140 12/10/2017 1128   K 4.0 12/10/2017 1128   CL 104 12/10/2017 1128   CO2 28 12/10/2017 1128   GLUCOSE 135 (H) 12/10/2017 1128   BUN 15 12/10/2017 1128   CREATININE 0.81 12/10/2017 1128   CREATININE 0.86 06/05/2014 0957   CALCIUM 9.6 12/10/2017 1128   PROT 8.3 (H) 12/10/2017 1128   ALBUMIN 3.8 12/10/2017 1128   AST 23 12/10/2017 1128   ALT 19 12/10/2017 1128   ALKPHOS 53 12/10/2017 1128   BILITOT 0.3 12/10/2017 1128   GFRNONAA >60 12/10/2017 1128   GFRAA >60 12/10/2017 1128    No results found for: SPEP, UPEP  Lab Results  Component Value Date   WBC 5.8 12/10/2017   NEUTROABS 2.5 12/10/2017   HGB 12.2 12/10/2017   HCT 37.9 12/10/2017   MCV 95.2 12/10/2017   PLT 236 12/10/2017      Chemistry      Component Value Date/Time   NA 140 12/10/2017 1128   K 4.0 12/10/2017 1128   CL 104 12/10/2017 1128   CO2 28 12/10/2017 1128   BUN 15 12/10/2017 1128   CREATININE 0.81 12/10/2017 1128   CREATININE 0.86 06/05/2014 0957      Component Value Date/Time   CALCIUM 9.6 12/10/2017 1128   ALKPHOS 53 12/10/2017 1128  AST 23 12/10/2017 1128   ALT 19 12/10/2017 1128   BILITOT 0.3 12/10/2017 1128       RADIOGRAPHIC STUDIES: I have reviewed multiple imaging studies with the patient and husband I have personally reviewed the radiological images as listed and agreed with the findings in the report. Ct Angio Chest Pe W Or Wo Contrast  Result Date: 11/14/2017 CLINICAL DATA:  Tachycardia, hypoxia. Status post recent hysterectomy. EXAM: CT ANGIOGRAPHY CHEST WITH CONTRAST TECHNIQUE: Multidetector CT imaging of the chest was performed using the standard protocol during bolus administration of intravenous contrast. Multiplanar CT image reconstructions and MIPs were obtained to evaluate the vascular anatomy. CONTRAST:  116m ISOVUE-370 IOPAMIDOL (ISOVUE-370) INJECTION 76% COMPARISON:  CT scan of October 31, 2017. FINDINGS: Cardiovascular: Satisfactory opacification of the  pulmonary arteries to the segmental level. No evidence of pulmonary embolism. Normal heart size. No pericardial effusion. Mediastinum/Nodes: No enlarged mediastinal, hilar, or axillary lymph nodes. Thyroid gland, trachea, and esophagus demonstrate no significant findings. Lungs/Pleura: No pneumothorax is noted. Mild bilateral posterior basilar subsegmental atelectasis is noted with minimal pleural effusions. Upper Abdomen: Fatty infiltration of the liver is noted. Pneumoperitoneum is noted consistent with recent surgery. Musculoskeletal: No chest wall abnormality. No acute or significant osseous findings. Review of the MIP images confirms the above findings. IMPRESSION: No definite evidence of pulmonary embolus. Mild bilateral posterior basilar subsegmental atelectasis is noted with minimal pleural effusions. Fatty infiltration of the liver is noted. Pneumoperitoneum is noted consistent with recent surgery. Electronically Signed   By: JMarijo Conception M.D.   On: 11/14/2017 18:31   Ct Abdomen Pelvis W Contrast  Result Date: 12/10/2017 CLINICAL DATA:  Ovarian cancer.  Follow-up. EXAM: CT ABDOMEN AND PELVIS WITH CONTRAST TECHNIQUE: Multidetector CT imaging of the abdomen and pelvis was performed using the standard protocol following bolus administration of intravenous contrast. CONTRAST:  1081mOMNIPAQUE IOHEXOL 300 MG/ML  SOLN COMPARISON:  10/31/2017 FINDINGS: Lower chest: No acute abnormality. Hepatobiliary: There is no focal liver abnormality identified. Previous cholecystectomy. No biliary ductal dilatation. Pancreas: Unremarkable. No pancreatic ductal dilatation or surrounding inflammatory changes. Spleen: There is a peritoneal deposit along the anterior aspect of the spleen which is new from previous exam measuring 2.6 cm, image 46/6. Adrenals/Urinary Tract: The adrenal glands appear normal. Cyst within the posterolateral cortex of the right kidney measures 2.6 cm. Urinary bladder appears normal.  Stomach/Bowel: The stomach appears normal. Mild increase caliber of the small bowel loops identified measuring up to 2.4 cm. No pathologic dilatation of the colon. Vascular/Lymphatic: Aortic atherosclerosis noted. No aneurysm. Prominent abdominal retroperitoneal lymph nodes identified without adenopathy. No pelvic adenopathy. Reproductive: Status post hysterectomy. Interval surgical resection of large bilateral adnexal tumors. Residual left adnexal tumor measures 2.8 by 4.3 cm, image 66/2. On the right this measures 2.3 by 1.2 cm. Other: The large omental cake is no longer visualized and may reflect interval omentectomy. Left upper quadrant peritoneal deposit is again identified noted and new from previous exam, image 11/2 and image 46/6. Musculoskeletal: Degenerative disc disease noted within the lumbar spine. No aggressive lytic or sclerotic bone lesions. IMPRESSION: 1. Interval resection of large bilateral adnexal cystic masses. Residual soft tissue/tumor within bilateral adnexal regions as above. 2. Interval resolution of previously noted omental cake which may reflect interval omentectomy. There is a new peritoneal deposit identified within the left upper quadrant of the abdomen involving the anterior surface of the spleen. Electronically Signed   By: TaKerby Moors.D.   On: 12/10/2017 14:55  All questions were answered. The patient knows to call the clinic with any problems, questions or concerns. No barriers to learning was detected.  I spent 40 minutes counseling the patient face to face. The total time spent in the appointment was 55 minutes and more than 50% was on counseling and review of test results  [name], MD 12/11/2017 11:22 AM

## 2017-12-11 NOTE — Assessment & Plan Note (Signed)
She has positive genetic testing She would qualify for PARP inhibitor as maintenance treatment in the future

## 2017-12-11 NOTE — Assessment & Plan Note (Signed)
We discussed dietary modification and close monitoring of blood sugar

## 2017-12-11 NOTE — Patient Instructions (Signed)
[facility] Discharge Instructions for Patients Receiving Chemotherapy  Today you received the following chemotherapy agents:  Oxaliplatin, Leucovorin, and 5FU.  To help prevent nausea and vomiting after your treatment, we encourage you to take your nausea medication as directed.   If you develop nausea and vomiting that is not controlled by your nausea medication, call the clinic.   BELOW ARE SYMPTOMS THAT SHOULD BE REPORTED IMMEDIATELY:  *FEVER GREATER THAN 100.5 F  *CHILLS WITH OR WITHOUT FEVER  NAUSEA AND VOMITING THAT IS NOT CONTROLLED WITH YOUR NAUSEA MEDICATION  *UNUSUAL SHORTNESS OF BREATH  *UNUSUAL BRUISING OR BLEEDING  TENDERNESS IN MOUTH AND THROAT WITH OR WITHOUT PRESENCE OF ULCERS  *URINARY PROBLEMS  *BOWEL PROBLEMS  UNUSUAL RASH Items with * indicate a potential emergency and should be followed up as soon as possible.  Feel free to call the clinic should you have any questions or concerns. The clinic phone number is [phone].  Please show the CHEMO ALERT CARD at check-in to the Emergency Department and triage nurse.  Oxaliplatin Injection What is this medicine? OXALIPLATIN (ox AL i PLA tin) is a chemotherapy drug. It targets fast dividing cells, like cancer cells, and causes these cells to die. This medicine is used to treat cancers of the colon and rectum, and many other cancers. This medicine may be used for other purposes; ask your health care provider or pharmacist if you have questions. COMMON BRAND NAME(S): Eloxatin What should I tell my health care provider before I take this medicine? They need to know if you have any of these conditions: -kidney disease -an unusual or allergic reaction to oxaliplatin, other chemotherapy, other medicines, foods, dyes, or preservatives -pregnant or trying to get pregnant -breast-feeding How should I use this medicine? This drug is given as an infusion into a vein. It is administered in a hospital  or clinic by a specially trained health care professional. Talk to your pediatrician regarding the use of this medicine in children. Special care may be needed. Overdosage: If you think you have taken too much of this medicine contact a poison control center or emergency room at once. NOTE: This medicine is only for you. Do not share this medicine with others. What if I miss a dose? It is important not to miss a dose. Call your doctor or health care professional if you are unable to keep an appointment. What may interact with this medicine? -medicines to increase blood counts like filgrastim, pegfilgrastim, sargramostim -probenecid -some antibiotics like amikacin, gentamicin, neomycin, polymyxin B, streptomycin, tobramycin -zalcitabine Talk to your doctor or health care professional before taking any of these medicines: -acetaminophen -aspirin -ibuprofen -ketoprofen -naproxen This list may not describe all possible interactions. Give your health care provider a list of all the medicines, herbs, non-prescription drugs, or dietary supplements you use. Also tell them if you smoke, drink alcohol, or use illegal drugs. Some items may interact with your medicine. What should I watch for while using this medicine? Your condition will be monitored carefully while you are receiving this medicine. You will need important blood work done while you are taking this medicine. This medicine can make you more sensitive to cold. Do not drink cold drinks or use ice. Cover exposed skin before coming in contact with cold temperatures or cold objects. When out in cold weather wear warm clothing and cover your mouth and nose to warm the air that goes into your lungs. Tell your doctor if you get sensitive to the   cold. This drug may make you feel generally unwell. This is not uncommon, as chemotherapy can affect healthy cells as well as cancer cells. Report any side effects. Continue your course of treatment even  though you feel ill unless your doctor tells you to stop. In some cases, you may be given additional medicines to help with side effects. Follow all directions for their use. Call your doctor or health care professional for advice if you get a fever, chills or sore throat, or other symptoms of a cold or flu. Do not treat yourself. This drug decreases your body's ability to fight infections. Try to avoid being around people who are sick. This medicine may increase your risk to bruise or bleed. Call your doctor or health care professional if you notice any unusual bleeding. Be careful brushing and flossing your teeth or using a toothpick because you may get an infection or bleed more easily. If you have any dental work done, tell your dentist you are receiving this medicine. Avoid taking products that contain aspirin, acetaminophen, ibuprofen, naproxen, or ketoprofen unless instructed by your doctor. These medicines may hide a fever. Do not become pregnant while taking this medicine. Women should inform their doctor if they wish to become pregnant or think they might be pregnant. There is a potential for serious side effects to an unborn child. Talk to your health care professional or pharmacist for more information. Do not breast-feed an infant while taking this medicine. Call your doctor or health care professional if you get diarrhea. Do not treat yourself. What side effects may I notice from receiving this medicine? Side effects that you should report to your doctor or health care professional as soon as possible: -allergic reactions like skin rash, itching or hives, swelling of the face, lips, or tongue -low blood counts - This drug may decrease the number of white blood cells, red blood cells and platelets. You may be at increased risk for infections and bleeding. -signs of infection - fever or chills, cough, sore throat, pain or difficulty passing urine -signs of decreased platelets or bleeding -  bruising, pinpoint red spots on the skin, black, tarry stools, nosebleeds -signs of decreased red blood cells - unusually weak or tired, fainting spells, lightheadedness -breathing problems -chest pain, pressure -cough -diarrhea -jaw tightness -mouth sores -nausea and vomiting -pain, swelling, redness or irritation at the injection site -pain, tingling, numbness in the hands or feet -problems with balance, talking, walking -redness, blistering, peeling or loosening of the skin, including inside the mouth -trouble passing urine or change in the amount of urine Side effects that usually do not require medical attention (report to your doctor or health care professional if they continue or are bothersome): -changes in vision -constipation -hair loss -loss of appetite -metallic taste in the mouth or changes in taste -stomach pain This list may not describe all possible side effects. Call your doctor for medical advice about side effects. You may report side effects to FDA at 1-800-FDA-1088. Where should I keep my medicine? This drug is given in a hospital or clinic and will not be stored at home. NOTE: This sheet is a summary. It may not cover all possible information. If you have questions about this medicine, talk to your doctor, pharmacist, or health care provider.  2018 Elsevier/Gold Standard (2007-11-05 17:22:47)  Leucovorin injection What is this medicine? LEUCOVORIN (loo koe VOR in) is used to prevent or treat the harmful effects of some medicines. This medicine is used to   treat anemia caused by a low amount of folic acid in the body. It is also used with 5-fluorouracil (5-FU) to treat colon cancer. This medicine may be used for other purposes; ask your health care provider or pharmacist if you have questions. What should I tell my health care provider before I take this medicine? They need to know if you have any of these conditions: -anemia from low levels of vitamin B-12 in  the blood -an unusual or allergic reaction to leucovorin, folic acid, other medicines, foods, dyes, or preservatives -pregnant or trying to get pregnant -breast-feeding How should I use this medicine? This medicine is for injection into a muscle or into a vein. It is given by a health care professional in a hospital or clinic setting. Talk to your pediatrician regarding the use of this medicine in children. Special care may be needed. Overdosage: If you think you have taken too much of this medicine contact a poison control center or emergency room at once. NOTE: This medicine is only for you. Do not share this medicine with others. What if I miss a dose? This does not apply. What may interact with this medicine? -capecitabine -fluorouracil -phenobarbital -phenytoin -primidone -trimethoprim-sulfamethoxazole This list may not describe all possible interactions. Give your health care provider a list of all the medicines, herbs, non-prescription drugs, or dietary supplements you use. Also tell them if you smoke, drink alcohol, or use illegal drugs. Some items may interact with your medicine. What should I watch for while using this medicine? Your condition will be monitored carefully while you are receiving this medicine. This medicine may increase the side effects of 5-fluorouracil, 5-FU. Tell your doctor or health care professional if you have diarrhea or mouth sores that do not get better or that get worse. What side effects may I notice from receiving this medicine? Side effects that you should report to your doctor or health care professional as soon as possible: -allergic reactions like skin rash, itching or hives, swelling of the face, lips, or tongue -breathing problems -fever, infection -mouth sores -unusual bleeding or bruising -unusually weak or tired Side effects that usually do not require medical attention (report to your doctor or health care professional if they continue or  are bothersome): -constipation or diarrhea -loss of appetite -nausea, vomiting This list may not describe all possible side effects. Call your doctor for medical advice about side effects. You may report side effects to FDA at 1-800-FDA-1088. Where should I keep my medicine? This drug is given in a hospital or clinic and will not be stored at home. NOTE: This sheet is a summary. It may not cover all possible information. If you have questions about this medicine, talk to your doctor, pharmacist, or health care provider.  2018 Elsevier/Gold Standard (2007-10-15 16:50:29)  Fluorouracil, 5-FU injection What is this medicine? FLUOROURACIL, 5-FU (flure oh YOOR a sil) is a chemotherapy drug. It slows the growth of cancer cells. This medicine is used to treat many types of cancer like breast cancer, colon or rectal cancer, pancreatic cancer, and stomach cancer. This medicine may be used for other purposes; ask your health care provider or pharmacist if you have questions. COMMON BRAND NAME(S): Adrucil What should I tell my health care provider before I take this medicine? They need to know if you have any of these conditions: -blood disorders -dihydropyrimidine dehydrogenase (DPD) deficiency -infection (especially a virus infection such as chickenpox, cold sores, or herpes) -kidney disease -liver disease -malnourished, poor nutrition -recent   or ongoing radiation therapy -an unusual or allergic reaction to fluorouracil, other chemotherapy, other medicines, foods, dyes, or preservatives -pregnant or trying to get pregnant -breast-feeding How should I use this medicine? This drug is given as an infusion or injection into a vein. It is administered in a hospital or clinic by a specially trained health care professional. Talk to your pediatrician regarding the use of this medicine in children. Special care may be needed. Overdosage: If you think you have taken too much of this medicine contact a  poison control center or emergency room at once. NOTE: This medicine is only for you. Do not share this medicine with others. What if I miss a dose? It is important not to miss your dose. Call your doctor or health care professional if you are unable to keep an appointment. What may interact with this medicine? -allopurinol -cimetidine -dapsone -digoxin -hydroxyurea -leucovorin -levamisole -medicines for seizures like ethotoin, fosphenytoin, phenytoin -medicines to increase blood counts like filgrastim, pegfilgrastim, sargramostim -medicines that treat or prevent blood clots like warfarin, enoxaparin, and dalteparin -methotrexate -metronidazole -pyrimethamine -some other chemotherapy drugs like busulfan, cisplatin, estramustine, vinblastine -trimethoprim -trimetrexate -vaccines Talk to your doctor or health care professional before taking any of these medicines: -acetaminophen -aspirin -ibuprofen -ketoprofen -naproxen This list may not describe all possible interactions. Give your health care provider a list of all the medicines, herbs, non-prescription drugs, or dietary supplements you use. Also tell them if you smoke, drink alcohol, or use illegal drugs. Some items may interact with your medicine. What should I watch for while using this medicine? Visit your doctor for checks on your progress. This drug may make you feel generally unwell. This is not uncommon, as chemotherapy can affect healthy cells as well as cancer cells. Report any side effects. Continue your course of treatment even though you feel ill unless your doctor tells you to stop. In some cases, you may be given additional medicines to help with side effects. Follow all directions for their use. Call your doctor or health care professional for advice if you get a fever, chills or sore throat, or other symptoms of a cold or flu. Do not treat yourself. This drug decreases your body's ability to fight infections. Try to  avoid being around people who are sick. This medicine may increase your risk to bruise or bleed. Call your doctor or health care professional if you notice any unusual bleeding. Be careful brushing and flossing your teeth or using a toothpick because you may get an infection or bleed more easily. If you have any dental work done, tell your dentist you are receiving this medicine. Avoid taking products that contain aspirin, acetaminophen, ibuprofen, naproxen, or ketoprofen unless instructed by your doctor. These medicines may hide a fever. Do not become pregnant while taking this medicine. Women should inform their doctor if they wish to become pregnant or think they might be pregnant. There is a potential for serious side effects to an unborn child. Talk to your health care professional or pharmacist for more information. Do not breast-feed an infant while taking this medicine. Men should inform their doctor if they wish to father a child. This medicine may lower sperm counts. Do not treat diarrhea with over the counter products. Contact your doctor if you have diarrhea that lasts more than 2 days or if it is severe and watery. This medicine can make you more sensitive to the sun. Keep out of the sun. If you cannot avoid being in the   sun, wear protective clothing and use sunscreen. Do not use sun lamps or tanning beds/booths. What side effects may I notice from receiving this medicine? Side effects that you should report to your doctor or health care professional as soon as possible: -allergic reactions like skin rash, itching or hives, swelling of the face, lips, or tongue -low blood counts - this medicine may decrease the number of white blood cells, red blood cells and platelets. You may be at increased risk for infections and bleeding. -signs of infection - fever or chills, cough, sore throat, pain or difficulty passing urine -signs of decreased platelets or bleeding - bruising, pinpoint red spots  on the skin, black, tarry stools, blood in the urine -signs of decreased red blood cells - unusually weak or tired, fainting spells, lightheadedness -breathing problems -changes in vision -chest pain -mouth sores -nausea and vomiting -pain, swelling, redness at site where injected -pain, tingling, numbness in the hands or feet -redness, swelling, or sores on hands or feet -stomach pain -unusual bleeding Side effects that usually do not require medical attention (report to your doctor or health care professional if they continue or are bothersome): -changes in finger or toe nails -diarrhea -dry or itchy skin -hair loss -headache -loss of appetite -sensitivity of eyes to the light -stomach upset -unusually teary eyes This list may not describe all possible side effects. Call your doctor for medical advice about side effects. You may report side effects to FDA at 1-800-FDA-1088. Where should I keep my medicine? This drug is given in a hospital or clinic and will not be stored at home. NOTE: This sheet is a summary. It may not cover all possible information. If you have questions about this medicine, talk to your doctor, pharmacist, or health care provider.  2018 Elsevier/Gold Standard (2007-08-14 13:53:16)    

## 2017-12-11 NOTE — Assessment & Plan Note (Signed)
The patient is aware she has incurable disease and treatment is strictly palliative. We discussed importance of Advanced Directives and Living will. 

## 2017-12-11 NOTE — Assessment & Plan Note (Signed)
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  I will reduce the dose of 5-FU bolus by 50% as well due to anticipated risk of pancytopenia The risk, benefits, side effects of treatment were discussed with the patient and her husband and she agreed to proceed

## 2017-12-12 ENCOUNTER — Telehealth: Payer: Self-pay | Admitting: Hematology and Oncology

## 2017-12-12 ENCOUNTER — Encounter (HOSPITAL_COMMUNITY): Payer: Self-pay | Admitting: Hematology and Oncology

## 2017-12-12 NOTE — Telephone Encounter (Signed)
Patient scheduled per 8/20 sch message.  °

## 2017-12-13 ENCOUNTER — Inpatient Hospital Stay: Payer: Medicare HMO

## 2017-12-13 DIAGNOSIS — C561 Malignant neoplasm of right ovary: Secondary | ICD-10-CM

## 2017-12-13 DIAGNOSIS — Z90710 Acquired absence of both cervix and uterus: Secondary | ICD-10-CM | POA: Diagnosis not present

## 2017-12-13 DIAGNOSIS — C771 Secondary and unspecified malignant neoplasm of intrathoracic lymph nodes: Secondary | ICD-10-CM | POA: Diagnosis not present

## 2017-12-13 DIAGNOSIS — Z90722 Acquired absence of ovaries, bilateral: Secondary | ICD-10-CM | POA: Diagnosis not present

## 2017-12-13 DIAGNOSIS — Z9221 Personal history of antineoplastic chemotherapy: Secondary | ICD-10-CM | POA: Diagnosis not present

## 2017-12-13 DIAGNOSIS — R590 Localized enlarged lymph nodes: Secondary | ICD-10-CM

## 2017-12-13 MED ORDER — SODIUM CHLORIDE 0.9% FLUSH
10.0000 mL | INTRAVENOUS | Status: DC | PRN
  Administered 2017-12-13: 10 mL
  Filled 2017-12-13: qty 10

## 2017-12-13 MED ORDER — HEPARIN SOD (PORK) LOCK FLUSH 100 UNIT/ML IV SOLN
500.0000 [IU] | Freq: Once | INTRAVENOUS | Status: AC | PRN
  Administered 2017-12-13: 500 [IU]
  Filled 2017-12-13: qty 5

## 2017-12-17 ENCOUNTER — Encounter: Payer: Self-pay | Admitting: Oncology

## 2017-12-17 NOTE — Progress Notes (Signed)
Gynecologic Oncology Multi-Disciplinary Disposition Conference Note  Date of the Conference: 12/17/2017  Patient Name: Sabrina Vang  Referring Provider: Primary GYN Oncologist:  Stage/Disposition:  Stage IV right ovarian epithelial cancer. Disposition is to outside opinion for pathology to decide mucinous vs serous cell type.  Review chemotherapy plan after outside path review. Possible Parp Inhibitor in the future..   This Multidisciplinary conference took place involving physicians from [place], Medical Oncology, Radiation Oncology, Pathology, Radiology along with the Gynecologic Oncology Nurse Practitioner and RN.  Comprehensive assessment of the patient's malignancy, staging, need for surgery, chemotherapy, radiation therapy, and need for further testing were reviewed. Supportive measures, both inpatient and following discharge were also discussed. The recommended plan of care is documented. Greater than 35 minutes were spent correlating and coordinating this patient's care.

## 2017-12-26 ENCOUNTER — Inpatient Hospital Stay: Payer: Medicare HMO | Attending: Obstetrics

## 2017-12-26 ENCOUNTER — Inpatient Hospital Stay: Payer: Medicare HMO

## 2017-12-26 ENCOUNTER — Other Ambulatory Visit: Payer: Self-pay | Admitting: Hematology and Oncology

## 2017-12-26 ENCOUNTER — Inpatient Hospital Stay (HOSPITAL_BASED_OUTPATIENT_CLINIC_OR_DEPARTMENT_OTHER): Payer: Medicare HMO | Admitting: Hematology and Oncology

## 2017-12-26 ENCOUNTER — Encounter: Payer: Self-pay | Admitting: Hematology and Oncology

## 2017-12-26 DIAGNOSIS — Z23 Encounter for immunization: Secondary | ICD-10-CM | POA: Diagnosis not present

## 2017-12-26 DIAGNOSIS — Z452 Encounter for adjustment and management of vascular access device: Secondary | ICD-10-CM | POA: Diagnosis not present

## 2017-12-26 DIAGNOSIS — Z90710 Acquired absence of both cervix and uterus: Secondary | ICD-10-CM | POA: Insufficient documentation

## 2017-12-26 DIAGNOSIS — E119 Type 2 diabetes mellitus without complications: Secondary | ICD-10-CM | POA: Diagnosis not present

## 2017-12-26 DIAGNOSIS — C786 Secondary malignant neoplasm of retroperitoneum and peritoneum: Secondary | ICD-10-CM | POA: Diagnosis not present

## 2017-12-26 DIAGNOSIS — C561 Malignant neoplasm of right ovary: Secondary | ICD-10-CM

## 2017-12-26 DIAGNOSIS — Z90722 Acquired absence of ovaries, bilateral: Secondary | ICD-10-CM | POA: Insufficient documentation

## 2017-12-26 DIAGNOSIS — G620 Drug-induced polyneuropathy: Secondary | ICD-10-CM

## 2017-12-26 DIAGNOSIS — R590 Localized enlarged lymph nodes: Secondary | ICD-10-CM

## 2017-12-26 DIAGNOSIS — Z5111 Encounter for antineoplastic chemotherapy: Secondary | ICD-10-CM | POA: Diagnosis not present

## 2017-12-26 DIAGNOSIS — T451X5A Adverse effect of antineoplastic and immunosuppressive drugs, initial encounter: Secondary | ICD-10-CM

## 2017-12-26 LAB — CMP (CANCER CENTER ONLY)
ALT: 12 U/L (ref 0–44)
AST: 16 U/L (ref 15–41)
Albumin: 3.7 g/dL (ref 3.5–5.0)
Alkaline Phosphatase: 52 U/L (ref 38–126)
Anion gap: 8 (ref 5–15)
BUN: 14 mg/dL (ref 8–23)
CALCIUM: 9.5 mg/dL (ref 8.9–10.3)
CO2: 27 mmol/L (ref 22–32)
CREATININE: 0.79 mg/dL (ref 0.44–1.00)
Chloride: 107 mmol/L (ref 98–111)
Glucose, Bld: 108 mg/dL — ABNORMAL HIGH (ref 70–99)
Potassium: 4.1 mmol/L (ref 3.5–5.1)
Sodium: 142 mmol/L (ref 135–145)
Total Bilirubin: 0.2 mg/dL — ABNORMAL LOW (ref 0.3–1.2)
Total Protein: 7.5 g/dL (ref 6.5–8.1)

## 2017-12-26 LAB — CBC WITH DIFFERENTIAL (CANCER CENTER ONLY)
Basophils Absolute: 0.1 10*3/uL (ref 0.0–0.1)
Basophils Relative: 1 %
EOS PCT: 3 %
Eosinophils Absolute: 0.1 10*3/uL (ref 0.0–0.5)
HCT: 36.1 % (ref 34.8–46.6)
Hemoglobin: 11.9 g/dL (ref 11.6–15.9)
LYMPHS ABS: 1.8 10*3/uL (ref 0.9–3.3)
LYMPHS PCT: 41 %
MCH: 30.6 pg (ref 25.1–34.0)
MCHC: 32.9 g/dL (ref 31.5–36.0)
MCV: 92.8 fL (ref 79.5–101.0)
MONO ABS: 0.4 10*3/uL (ref 0.1–0.9)
MONOS PCT: 9 %
Neutro Abs: 2.1 10*3/uL (ref 1.5–6.5)
Neutrophils Relative %: 46 %
PLATELETS: 173 10*3/uL (ref 145–400)
RBC: 3.89 MIL/uL (ref 3.70–5.45)
RDW: 14.7 % — ABNORMAL HIGH (ref 11.2–14.5)
WBC: 4.5 10*3/uL (ref 3.9–10.3)

## 2017-12-26 MED ORDER — DEXTROSE 5 % IV SOLN
INTRAVENOUS | Status: DC
  Filled 2017-12-26: qty 250

## 2017-12-26 MED ORDER — DEXTROSE 5 % IV SOLN
Freq: Once | INTRAVENOUS | Status: AC
  Administered 2017-12-26: 11:00:00 via INTRAVENOUS
  Filled 2017-12-26: qty 250

## 2017-12-26 MED ORDER — FLUOROURACIL CHEMO INJECTION 500 MG/10ML
200.0000 mg/m2 | Freq: Once | INTRAVENOUS | Status: AC
  Administered 2017-12-26: 350 mg via INTRAVENOUS
  Filled 2017-12-26: qty 7

## 2017-12-26 MED ORDER — LEUCOVORIN CALCIUM INJECTION 350 MG
400.0000 mg/m2 | Freq: Once | INTRAVENOUS | Status: AC
  Administered 2017-12-26: 700 mg via INTRAVENOUS
  Filled 2017-12-26: qty 35

## 2017-12-26 MED ORDER — OXALIPLATIN CHEMO INJECTION 100 MG/20ML
68.0000 mg/m2 | Freq: Once | INTRAVENOUS | Status: AC
  Administered 2017-12-26: 120 mg via INTRAVENOUS
  Filled 2017-12-26: qty 4

## 2017-12-26 MED ORDER — DEXAMETHASONE SODIUM PHOSPHATE 10 MG/ML IJ SOLN
10.0000 mg | Freq: Once | INTRAMUSCULAR | Status: AC
  Administered 2017-12-26: 10 mg via INTRAVENOUS

## 2017-12-26 MED ORDER — SODIUM CHLORIDE 0.9% FLUSH
10.0000 mL | INTRAVENOUS | Status: DC | PRN
  Filled 2017-12-26: qty 10

## 2017-12-26 MED ORDER — PALONOSETRON HCL INJECTION 0.25 MG/5ML
0.2500 mg | Freq: Once | INTRAVENOUS | Status: AC
  Administered 2017-12-26: 0.25 mg via INTRAVENOUS

## 2017-12-26 MED ORDER — PALONOSETRON HCL INJECTION 0.25 MG/5ML
INTRAVENOUS | Status: AC
  Filled 2017-12-26: qty 5

## 2017-12-26 MED ORDER — HEPARIN SOD (PORK) LOCK FLUSH 100 UNIT/ML IV SOLN
500.0000 [IU] | Freq: Once | INTRAVENOUS | Status: DC | PRN
  Filled 2017-12-26: qty 5

## 2017-12-26 MED ORDER — DEXAMETHASONE SODIUM PHOSPHATE 10 MG/ML IJ SOLN
INTRAMUSCULAR | Status: AC
  Filled 2017-12-26: qty 1

## 2017-12-26 MED ORDER — SODIUM CHLORIDE 0.9 % IV SOLN
2400.0000 mg/m2 | INTRAVENOUS | Status: DC
  Administered 2017-12-26: 4200 mg via INTRAVENOUS
  Filled 2017-12-26: qty 84

## 2017-12-26 MED ORDER — SODIUM CHLORIDE 0.9% FLUSH
10.0000 mL | Freq: Once | INTRAVENOUS | Status: AC
  Administered 2017-12-26: 10 mL
  Filled 2017-12-26: qty 10

## 2017-12-26 NOTE — Patient Instructions (Signed)
[facility] Discharge Instructions for Patients Receiving Chemotherapy  Today you received the following chemotherapy agents Oxaliplatin, Leucovorin & Fluorouracil  To help prevent nausea and vomiting after your treatment, we encourage you to take your nausea medication as prescribed.   If you develop nausea and vomiting that is not controlled by your nausea medication, call the clinic.   BELOW ARE SYMPTOMS THAT SHOULD BE REPORTED IMMEDIATELY:  *FEVER GREATER THAN 100.5 F  *CHILLS WITH OR WITHOUT FEVER  NAUSEA AND VOMITING THAT IS NOT CONTROLLED WITH YOUR NAUSEA MEDICATION  *UNUSUAL SHORTNESS OF BREATH  *UNUSUAL BRUISING OR BLEEDING  TENDERNESS IN MOUTH AND THROAT WITH OR WITHOUT PRESENCE OF ULCERS  *URINARY PROBLEMS  *BOWEL PROBLEMS  UNUSUAL RASH Items with * indicate a potential emergency and should be followed up as soon as possible.  Feel free to call the clinic should you have any questions or concerns. The clinic phone number is [phone].  Please show the [place] at check-in to the Emergency Department and triage nurse.

## 2017-12-26 NOTE — Assessment & Plan Note (Signed)
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

## 2017-12-26 NOTE — Progress Notes (Signed)
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  Peripheral neuropathy due to chemotherapy [facility]) She had mild residual neuropathy from prior treatment but denies worsening neuropathy since therapy I will observe only.  Type 2 diabetes mellitus treated without insulin (HCC) Her blood sugar has been stable at home.  Continue metformin and dietary modification   No orders of the defined types were placed in this encounter.   INTERVAL HISTORY: Please see below for problem oriented charting. She returns for cycle 2 of treatment She tolerated cycle 1 well Denies mucositis, nausea or diarrhea No recent infection, fever or chills She denies worsening peripheral neuropathy.  Denies abdominal distention, bloating or changes in bowel habits.  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 The left adnexa measured 12.7 x 11.6 x 8.1 with low level echoes and soft tissue nodules     07/30/2017 Tumor Marker    Patient's tumor was tested for the following markers: CA-125 Results of the tumor marker test revealed 521.3    08/17/2017 Pathology Results    The malignant cells are positive for PAX-8, cytokeratin 7, estrogen receptor, and faintly  positive for GATA-3. They are negative for p53, GCDFP, and cytokeratin 20. The finding are consistent with a gynecologic primary carcinoma. Additional studies can be performed upon clinician request.    08/17/2017 Procedure    Technically successful CT-guided left lower quadrant omental mass core biopsy.    08/20/2017 PET scan    1. Cystic masses arising from the pelvis. The nodular component of the RIGHT cystic mass is intensely hypermetabolic consistent with malignant ovarian neoplasm. 2. Extensive hypermetabolic peritoneal thickening in the lower abdomen and upper pelvis, upper abdomen, and upper abdominal precordial fat and paradiaphragmatic fat. 3. Retroperitoneal nodal metastasis adjacent to the IVC at the level the kidneys. 4. No evidence of metastatic disease in the thorax other small effusion on the LEFT and nodal metastasis in the fat superior to the diaphragm. 5. Mild metabolic activity associated the distal esophagus is favored benign esophagitis.    08/20/2017 Imaging    CT chest 1. Bilateral cardiophrenic angle nodal metastasis. No additional findings to suggest metastatic disease to the chest. 2. Small left pleural effusion. 3. Peritoneal carcinomatosis noted within the abdomen. 4. Hepatic steatosis.     08/24/2017 Cancer Staging    Staging form: Ovary, Fallopian Tube, and Primary Peritoneal Carcinoma, AJCC 8th Edition - Clinical: Stage IV (cT3, cN1, cM1) - Signed by [name], MD on 08/24/2017    08/31/2017 Tumor Marker    Patient's tumor was tested for the following markers: CA-125 Results of the tumor marker test revealed 819.9    09/26/2017 Adverse Reaction    She developed reaction to Taxol, managed successfully with additional premedications    10/17/2017  Tumor Marker    Patient's tumor was tested for the following markers: CA-125 Results of the tumor marker test revealed 374.4    10/31/2017 Imaging    1. No significant change in size of the large complex bilateral  adnexal masses consistent with the known history of ovarian cancer. There is increased dependent density within the left-sided lesion. 2. Stable peritoneal carcinomatosis.  No significant ascites.  3. The bilateral pericardiac adenopathy has improved. No progressive thoracic metastatic disease. No pulmonary or osseous metastatic disease. 4. Suspicion of nonocclusive thrombus in the right internal jugular vein related to the right IJ port. Recommend further evaluation with Doppler ultrasound.    11/13/2017 Surgery    Preoperative Diagnosis: Ovarian cancer s/p neoadjuvant chemotherapy   Procedure(s) Performed:   1. Exploratory laparotomy  2. Bilateral salpingo-oophorectomy with radical tumor debulking for ovarian cancer   including retroperitoneal dissection, lysis of adhesions (enterolysis of small bowel in deep pelvis, left adnexal adhesions to sigmoid and omentum to LLQ) ~1 hour, ureterolysis. 3. Omentectomy  4. Takedown of splenic flexure with removal of omental tumor in left upper quadrant  Specimens: Bilateral tubes and ovaries, omentum, splenic flexure nodule, right paracolic gutter nodule, cecal nodule, right ureteral peritoneal nodule, small bowel mesentary.  Indication for Procedure:  Patient is s/p 3 cycles of chemotherapy with response based on CA125 and decreased mediastinal disease (to <1cm).  Operative Findings:  This represented an optimal cytoreduction with gross residual disease remaining in the deep pelvis near the levator floor and possibly in the region of the right IP ligament/periappendicial region.  Upon entry a large ~20cm right tube/ovary, mostly cystic. Adherent rind along ileocecal region and appendix. Large cystic left tube/ovary ~10cm with sigmoid colon stretched across the mass and extension of the cystic lesion into the deep pelvis with residual palpable disease deep pelvis near levators. Estimate ~1cm or less on palpation, not visible disease. Omental  caking in the LLQ adherent to pelvic sidewall. Omental disease noted RUQ separate. In addition ~1.5cm lesion in splenic flexure omentum. Evidence of prior diaphragmatic disease, no visible disease.       11/13/2017 Pathology Results    1. 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 - TUMOR INVOLVES THE OVARIAN SURFACE. - RIGHT FALLOPIAN TUBE IS INVOLVED. - SEE ONCOLOGY TABLE. - SEE NOTE. 2. Soft tissue mass, biopsy, cecal gutter nodule - METASTATIC MUCINOUS ADENOCARCINOMA. 3. Ovary and fallopian tube, left, left ovary and fallopian tube - METASTATIC MUCINOUS ADENOCARCINOMA TO LEFT OVARY AND FALLOPIAN TUBE. 4. Omentum, resection for tumor - METASTATIC MUCINOUS ADENOCARCINOMA TO OMENTUM. 5. Soft tissue mass, biopsy, splenic flexure nodule - METASTATIC MUCINOUS ADENOCARCINOMA. 6. Soft tissue mass, biopsy, cecal implant - METASTATIC MUCINOUS ADENOCARCINOMA. 7. Soft tissue mass, biopsy, right ureteral peritoneal implant - METASTATIC MUCINOUS ADENOCARCINOMA. 8. Mesentery, small bowel nodule - METASTATIC MUCINOUS ADENOCARCINOMA. Microscopic Comment 1. OVARY or FALLOPIAN TUBE or PRIMARY PERITONEUM: Procedure: Bilateral salpingo-oophorectomy. Specimen Integrity: Intact. Tumor Site: Right ovary. Ovarian Surface Involvement: Present. Fallopian Tube Surface Involvement: Present. Tumor Size: 20 cm. Histologic Type: Mucinous adenocarcinoma. Histologic Grade: Overall G2 (moderately differentiated) (focal areas of poor differentiation are present). Implants: Present. Other Tissue/ Organ Involvement: Left ovary, left fallopian tube and omentum. Largest Extrapelvic Peritoneal Focus: less than 2 cm. See note Peritoneal/Ascitic Fluid: N/A Treatment Effect: No definite or minimal response identified (chemotherapy response score 1 [CRS 1]) Regional Lymph Nodes No lymph nodes submitted or found Number of Lymph Nodes Examined: 0 Pathologic Stage  Classification (pTNM, AJCC 8th Edition): ypT3b, ypNX. Representative Tumor Block: 1D and 1E. (NDK:gt, 11/15/17)    11/19/2017 Genetic Testing    Negative genetic testing on the [employer] Myrisk panel.  The [facility] gene panel offered by [employer] includes sequencing and deletion/duplication testing of the following 35 genes: APC, ATM, AXIN2, BARD1, BMPR1A, BRCA1, BRCA2, BRIP1, CHD1, CDK4, CDKN2A, CHEK2, EPCAM (large rearrangement only), HOXB13, (sequencing only), GALNT12, MLH1, MSH2, MSH3 (excluding repetitive portions of exon 1), MSH6, MUTYH, NBN, NTHL1, PALB2, PMS2, PTEN, RAD51C, RAD51D, RNF43, RPS20, SMAD4, STK11, and TP53. Sequencing was performed for select regions of POLE and POLD1, and large rearrangement analysis was performed for select regions of GREM1. The report date is November 19, 2017.  HRD tumor results indicate a BRCA1 mutation identified in the ovarian tumor causing genomic instability. The report date is 12/04/2017      Genetic Testing    Patient has genetic testing done for ER/PR and Her2/neu. Results revealed patient has the following: ER 15-20% PR 5-10% Her2/neu - negative     Genetic Testing    Patient has genetic testing done for MSI/MMR. Results revealed patient has the following mutation(s): MMR normal, MSI stable    12/10/2017 Imaging    1. Interval resection of large bilateral adnexal cystic masses. Residual soft tissue/tumor within bilateral adnexal regions as above. 2. Interval resolution of previously noted omental cake which may reflect interval omentectomy. There is a new peritoneal deposit identified within the left upper quadrant of the abdomen involving the anterior surface of the spleen.    12/10/2017 Tumor Marker    Patient's tumor was tested for the following markers: CA-125 Results of the tumor marker test revealed 94.9    12/11/2017 -  Chemotherapy    The patient had FOLFOX regimen for mucinous     REVIEW OF SYSTEMS:   Constitutional:  Denies fevers, chills or abnormal weight loss Eyes: Denies blurriness of vision Ears, nose, mouth, throat, and face: Denies mucositis or sore throat Respiratory: Denies cough, dyspnea or wheezes Cardiovascular: Denies palpitation, chest discomfort or lower extremity swelling Gastrointestinal:  Denies nausea, heartburn or change in bowel habits Skin: Denies abnormal skin rashes Lymphatics: Denies new lymphadenopathy or easy bruising Neurological:Denies numbness, tingling or new weaknesses Behavioral/Psych: Mood is stable, no new changes  All other systems were reviewed with the patient and are negative.  I have reviewed the past medical history, past surgical history, social history and family history with the patient and they are unchanged from previous note.  ALLERGIES:  has No Known Allergies.  MEDICATIONS:  Current Outpatient Medications  Medication Sig Dispense Refill  . acetaminophen (TYLENOL) 500 MG tablet Take 500 mg by mouth every 8 (eight) hours as needed for mild pain or moderate pain.    Marland Kitchen atenolol (TENORMIN) 25 MG tablet Take 25 mg by mouth at bedtime.     Marland Kitchen CALCIUM-MAGNESIUM-VITAMIN D PO Take 1 tablet by mouth daily.    . famotidine (PEPCID) 20 MG tablet Take 20 mg by mouth as needed for heartburn or indigestion.    . lidocaine-prilocaine (EMLA) cream Apply 1 application topically as needed. 30 g 6  . loratadine (CLARITIN) 10 MG tablet Take 10 mg by mouth daily as needed for allergies.    . metFORMIN (GLUCOPHAGE) 1000 MG tablet Take 500 mg by mouth 2 (two) times daily.   0  . Multiple Vitamin (MULTIVITAMIN WITH MINERALS) TABS tablet Take 1 tablet by mouth daily.    . ondansetron (ZOFRAN) 8 MG tablet  Take 1 tablet (8 mg total) by mouth every 8 (eight) hours as needed for nausea. 30 tablet 3  . prochlorperazine (COMPAZINE) 10 MG tablet Take 1 tablet (10 mg total) by mouth every 6 (six) hours as needed for nausea or vomiting. 30 tablet 0   No current facility-administered  medications for this visit.     PHYSICAL EXAMINATION: ECOG PERFORMANCE STATUS: 0 - Asymptomatic  Vitals:   12/26/17 1001  BP: 127/68  Pulse: 71  Resp: 18  Temp: 98.2 F (36.8 C)  SpO2: 100%   Filed Weights   12/26/17 1001  Weight: 137 lb 3.2 oz (62.2 kg)    GENERAL:alert, no distress and comfortable SKIN: skin color, texture, turgor are normal, no rashes or significant lesions EYES: normal, Conjunctiva are pink and non-injected, sclera clear OROPHARYNX:no exudate, no erythema and lips, buccal mucosa, and tongue normal  NECK: supple, thyroid normal size, non-tender, without nodularity LYMPH:  no palpable lymphadenopathy in the cervical, axillary or inguinal LUNGS: clear to auscultation and percussion with normal breathing effort HEART: regular rate & rhythm and no murmurs and no lower extremity edema ABDOMEN:abdomen soft, non-tender and normal bowel sounds Musculoskeletal:no cyanosis of digits and no clubbing  NEURO: alert & oriented x 3 with fluent speech, no focal motor/sensory deficits  LABORATORY DATA:  I have reviewed the data as listed    Component Value Date/Time   NA 140 12/10/2017 1128   K 4.0 12/10/2017 1128   CL 104 12/10/2017 1128   CO2 28 12/10/2017 1128   GLUCOSE 135 (H) 12/10/2017 1128   BUN 15 12/10/2017 1128   CREATININE 0.81 12/10/2017 1128   CREATININE 0.86 06/05/2014 0957   CALCIUM 9.6 12/10/2017 1128   PROT 8.3 (H) 12/10/2017 1128   ALBUMIN 3.8 12/10/2017 1128   AST 23 12/10/2017 1128   ALT 19 12/10/2017 1128   ALKPHOS 53 12/10/2017 1128   BILITOT 0.3 12/10/2017 1128   GFRNONAA >60 12/10/2017 1128   GFRAA >60 12/10/2017 1128    No results found for: SPEP, UPEP  Lab Results  Component Value Date   WBC 4.5 12/26/2017   NEUTROABS 2.1 12/26/2017   HGB 11.9 12/26/2017   HCT 36.1 12/26/2017   MCV 92.8 12/26/2017   PLT 173 12/26/2017      Chemistry      Component Value Date/Time   NA 140 12/10/2017 1128   K 4.0 12/10/2017 1128   CL  104 12/10/2017 1128   CO2 28 12/10/2017 1128   BUN 15 12/10/2017 1128   CREATININE 0.81 12/10/2017 1128   CREATININE 0.86 06/05/2014 0957      Component Value Date/Time   CALCIUM 9.6 12/10/2017 1128   ALKPHOS 53 12/10/2017 1128   AST 23 12/10/2017 1128   ALT 19 12/10/2017 1128   BILITOT 0.3 12/10/2017 1128       RADIOGRAPHIC STUDIES: I have personally reviewed the radiological images as listed and agreed with the findings in the report. Ct Abdomen Pelvis W Contrast  Result Date: 12/10/2017 CLINICAL DATA:  Ovarian cancer.  Follow-up. EXAM: CT ABDOMEN AND PELVIS WITH CONTRAST TECHNIQUE: Multidetector CT imaging of the abdomen and pelvis was performed using the standard protocol following bolus administration of intravenous contrast. CONTRAST:  119m OMNIPAQUE IOHEXOL 300 MG/ML  SOLN COMPARISON:  10/31/2017 FINDINGS: Lower chest: No acute abnormality. Hepatobiliary: There is no focal liver abnormality identified. Previous cholecystectomy. No biliary ductal dilatation. Pancreas: Unremarkable. No pancreatic ductal dilatation or surrounding inflammatory changes. Spleen: There is a peritoneal deposit along  the anterior aspect of the spleen which is new from previous exam measuring 2.6 cm, image 46/6. Adrenals/Urinary Tract: The adrenal glands appear normal. Cyst within the posterolateral cortex of the right kidney measures 2.6 cm. Urinary bladder appears normal. Stomach/Bowel: The stomach appears normal. Mild increase caliber of the small bowel loops identified measuring up to 2.4 cm. No pathologic dilatation of the colon. Vascular/Lymphatic: Aortic atherosclerosis noted. No aneurysm. Prominent abdominal retroperitoneal lymph nodes identified without adenopathy. No pelvic adenopathy. Reproductive: Status post hysterectomy. Interval surgical resection of large bilateral adnexal tumors. Residual left adnexal tumor measures 2.8 by 4.3 cm, image 66/2. On the right this measures 2.3 by 1.2 cm. Other: The  large omental cake is no longer visualized and may reflect interval omentectomy. Left upper quadrant peritoneal deposit is again identified noted and new from previous exam, image 11/2 and image 46/6. Musculoskeletal: Degenerative disc disease noted within the lumbar spine. No aggressive lytic or sclerotic bone lesions. IMPRESSION: 1. Interval resection of large bilateral adnexal cystic masses. Residual soft tissue/tumor within bilateral adnexal regions as above. 2. Interval resolution of previously noted omental cake which may reflect interval omentectomy. There is a new peritoneal deposit identified within the left upper quadrant of the abdomen involving the anterior surface of the spleen. Electronically Signed   By: Kerby Moors M.D.   On: 12/10/2017 14:55    All questions were answered. The patient knows to call the clinic with any problems, questions or concerns. No barriers to learning was detected.  I spent 15 minutes counseling the patient face to face. The total time spent in the appointment was 20 minutes and more than 50% was on counseling and review of test results  [name], MD 12/26/2017 10:20 AM

## 2017-12-26 NOTE — Assessment & Plan Note (Signed)
Her blood sugar has been stable at home.  Continue metformin and dietary modification

## 2017-12-26 NOTE — Assessment & Plan Note (Signed)
She tolerated cycle 1 of chemotherapy well without major side effects No worsening neuropathy Denies mucositis, nausea or diarrhea We will proceed with treatment without dose adjustment After 4 doses of treatment, I plan to repeat CT imaging, around middle of the month next month.

## 2017-12-27 ENCOUNTER — Telehealth: Payer: Self-pay | Admitting: Hematology and Oncology

## 2017-12-27 NOTE — Telephone Encounter (Signed)
Returned patient's call.  Patient inquiring about 9/18 appt.  This appt was added to book for approval.  Alwyn Ren working on getting approval.  I will call patient back with appt time/and or she will stop in to sch on 9/6 to get it.  Also, pump stop needed to be added for 10/20.

## 2017-12-27 NOTE — Telephone Encounter (Signed)
Pls note pump stop added for 10/18, not 10/20.

## 2017-12-27 NOTE — Progress Notes (Signed)
GYNECOLOGIC ONCOLOGY - [facility] at [facility] at [facility]  Progress Note : GYN-ONC Established Patient FOLLOW-UP   Originally consult was requested by Dr. Evie Lacks for an adnexal mass and elevated CA125; ultimately diagnosed with ovarian cancer.   CC:  Chief Complaint  Patient presents with  . Right ovarian epithelial cancer [facility])   Oncologic Summary 1. 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  P2   Interval History:  Since her last visit she had imaging (CT -- IMPRESSION: 1. Interval resection of large bilateral adnexal cystic masses. Residual soft tissue/tumor within bilateral adnexal regions as above. 2. Interval resolution of previously noted omental cake which may reflect interval omentectomy. There is a new peritoneal deposit identified within the left upper quadrant of the abdomen involving the anterior surface of the spleen)  Dr. Alvy Bimler and I agreed on a change of chemo to a GI regimen, given the lack of meaningful response in the volume and pathologic assessment of the tumor at time of interval debulking along with disease progression even since surgery. She has since also been found to have tumor profiling with HRD (BRCA) mutational status.   She returns for continued mutual management and followup. She has not been seen since her early postop visit.   She has no complaints of concern. She has a little tingling in the hands with cold beverages and is avoiding those. Otherwise no issues with the first cycle of the new regimen   Presenting History:  She presented to her gynecologist for her annual exam and noted symptoms of bloating along with decreased appetite however increased weight.  On  physical exam her gynecologist noted a palpable mass and imaging was ordered including ultrasound.  Ultrasound revealed a complex cystic mass in the right adnexa measuring 20 x 11 x 12 cm with multiple internal septations some of which are thick.  The left adnexa measured 12.7 x 11.6 x 8.1 with low level echoes and soft tissue nodules.  This was 07/30/2017.  The same day she had a Ca125 equal to 521.3.  At some point a provider ordered an MRI of the abdomen and pelvis with and without contrast the lower chest revealed several prominent lower anterior mediastinal lymph nodes including a pericardial measuring 12 mm in short axis.  Hepatic steatosis.  Surface of the liver is noted to have several soft tissue areas of thickening and enhancement suspicious for serosal implants.  Peritoneal disease is expected in the upper abdomen adjacent to the liver and transverse colon.  Extensive omental and peritoneal implants are appreciated in the left lower quadrant.  She does have an abdominal MRI in 2018 for pancreatic cyst no abdominal pelvic mass or peritoneal disease was noted at that time.  Looking back she thinks that over the 6 months prior to presentation she noted she had just not been feeling her usual self with decreased energy and occasional discomfort in the abdomen.  Again she notes a weight gain despite a decreased appetite.  She describes her pain as being vaginal and abdominal 5 out of 10 sharp to dull it is brief but aggravated by sitting she alleviates it with anti-inflammatories.  She does also note bloating increased symptoms of reflux and urinary urgency.  Her bowel movements do not seem to be affected she is having daily bowel movements and denies persistent nausea and vomiting.  She has had some nausea which occurs about twice per week.  On a side note she was a widow and recently married a new gentleman.  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 There is  mucinous differentiation. In addition a PET was performed to look at the mediastinal/cardiophrenic lymph node.  Unfortunately that did show FDG uptake and appears to be consistent with metastatic disease which would make her unresectable in the upfront setting.  She started chemo 09/05/17 with Carbo/Taxol Ca125 decreased  . 07/30/2017-Ca125-  521.3 performed at [place] . 08/31/17 = 819.9 (C1) . 09/26/17 = not done? (C2) . 10/17/17 = 374.4 (C3) Imaging postcycle 3 showed response in the paracardiac adenopathy from 47m --> 923m However all other regions showed overall stable disease. There was no progression noted. -- There is a tubular filling defect extending superiorly into the right internal jugular vein (axial images 2 through 7/2), not typical for inflow artifact. This could reflect nonocclusive thrombus. The left internal jugular vein and SVC appear normal. Aberrant right subclavian artery noted. -- The previously demonstrated bilateral paracardiac nodes have improved. Node at the right cardiophrenic angle measures 8 mm short axis on image 43/2 (previously 19 mm). Left pericardiac node measures 9 mm on image 48/2 (previously 13 mm). Additional small mediastinal and axillary lymph nodes bilaterally are stable. There is mild distal esophageal wall thickening. The trachea appears unremarkable. Lungs/Pleura: There is no pleural effusion. Mild dependent atelectasis at both lung bases. No suspicious pulmonary nodules. Musculoskeletal/Chest wall: No chest wall mass or suspicious osseous findings. CT ABDOMEN AND PELVIS FINDINGS Hepatobiliary: Hepatic steatosis as before. No focal hepatic lesion or abnormal enhancement. No significant biliary dilatation status post cholecystectomy. Pancreas: Unremarkable. No pancreatic ductal dilatation or surrounding inflammatory changes. Spleen: Normal in size without focal abnormality. Adrenals/Urinary Tract: Both adrenal glands appear normal. There is a stable cyst  in the interpolar region of the right kidney. No evidence of renal mass, urinary tract calculus or hydronephrosis. No significant bladder findings. Stomach/Bowel: No evidence of bowel wall thickening, distention or surrounding inflammatory change. There is peripheral displacement of the bowel by the large bilateral adnexal lesions. Vascular/Lymphatic: There are no enlarged abdominal or pelvic lymph nodes. There are stable small inguinal lymph nodes bilaterally mild aortic and branch vessel atherosclerosis. There is flattening of the IVC without evidence of thrombosis. Reproductive: Previous hysterectomy. Large complex bilateral adnexal masses are again noted. The right adnexal lesion measures up to 21.5 x 13.6 cm transverse (image 77/2) and extends 24.7 cm cephalocaudad. The irregular solid components along the inferolateral aspect of this lesion have not significantly changed. Left adnexal lesion measures 13.0 x 7.3 x 13.6 cm and demonstrates increased density within a dependent component which measures 2.2 x 2.5 cm transverse on image 102/2. Other: Omental nodularity is similar to the previous studies, hypermetabolic on PET-CT. Largest component measures up to 5.3 x 1.8 cm on image 99/2. No significant associated ascites. Musculoskeletal: No acute or significant osseous findings. IMPRESSION: 1. No significant change in size of the large complex bilateral adnexal masses consistent with the known history of ovarian cancer. There is increased dependent density within the left-sided lesion. 2. Stable peritoneal carcinomatosis.  No significant ascites. 3. The bilateral pericardiac adenopathy has improved. No progressive thoracic metastatic disease. No pulmonary or osseous metastatic disease. 4. Suspicion of nonocclusive thrombus in the  right internal jugular vein related to the right IJ port. Recommend further evaluation with Doppler ultrasound.   Therefore 11/13/17 she was taken for interval debulking -- Exploratory  laparotomy,bilateral salpingo-oophorectomy with radical tumor debulking for ovarian cancer including retroperitoneal dissection, lysis of adhesions (enterolysis of small bowel in deep pelvis, left adnexal adhesions to sigmoid and omentum to LLQ) ~1 hour, ureterolysis. Omentectomy; Takedown of splenic flexure with removal of omental tumor in left upper quadrant. Findings included; Upon entry a large ~20cm right tube/ovary, mostly cystic. Adherent rind along ileocecal region and appendix. Large cystic left tube/ovary ~10cm with sigmoid colon stretched across the mass and extension of the cystic lesion into the deep pelvis with residual palpable disease deep pelvis near levators. Estimate ~1cm or less on palpation, not visible disease. Omental caking in the LLQ adherent to pelvic sidewall. Omental disease noted RUQ separate. In addition ~1.5cm lesion in splenic flexure omentum. Evidence of prior diaphragmatic disease, no visible disease. This represented an optimal cytoreduction R1 with gross residual disease remaining in the deep pelvis near the levator floor and suspect in the region of the right IP ligament/periappendiceal region.   Final pathology of all specimen revealed ; INVASIVE MUCINOUS ADENOCARCINOMA. Overall G2 (moderately differentiated) (focal areas of poor differentiation are present).No definite or minimal response identified (chemotherapy response score 1 [CRS 1]     Measurement of disease:  Recent Labs    08/31/17 1118 10/17/17 0751 12/10/17 1128  CAN125 819.9* 374.4* 94.9*    . 07/30/2017-Ca125-  521.3 performed at [place] . 08/31/17 = 819.9 (C1) . 09/26/17 = not done (C2) . 10/17/17 = 374.4   CEA and CA19-9 ordered 08/22/17 given the "mucinous" differentiation in the biopsy report -both normal   Radiology:   08/02/2017-MRI of the abdomen and pelvis with and without contrast -performed in [place], [place] -  the lower chest revealed several prominent lower anterior mediastinal  lymph nodes including a pericardial measuring 12 mm in short axis.  Hepatic steatosis.  Surface of the liver is noted to have several soft tissue areas of thickening and enhancement suspicious for serosal implants.  Peritoneal disease is expected in the upper abdomen adjacent to the liver and transverse colon.  Extensive omental and peritoneal implants are appreciated in the left lower quadrant.  08/20/2017 chest CT-[place]-as noted in the oncologic history  08/20/2017-PET scan-[place]-as noted in the oncologic history 10/31/2017 - CT CAP - [facility] CLINICAL DATA:  Ovarian cancer diagnosed in April 2019. Chemotherapy in progress. No current complaints. EXAM: CT CHEST, ABDOMEN, AND PELVIS WITH CONTRAST TECHNIQUE: Multidetector CT imaging of the chest, abdomen and pelvis was performed following the standard protocol during bolus administration of intravenous contrast. CONTRAST:  166m ISOVUE-300 IOPAMIDOL (ISOVUE-300) INJECTION 61% COMPARISON:  Abdominopelvic MRI 08/02/2017, PET-CT 08/21/1998 chest CT 08/20/2017. FINDINGS: CT CHEST FINDINGS Cardiovascular: Interval placement of a right IJ Port-A-Cath extends to the lower SVC. There is a tubular filling defect extending superiorly into the right internal jugular vein (axial images 2 through 7/2), not typical for inflow artifact. This could reflect nonocclusive thrombus. The left internal jugular vein and SVC appear normal. Aberrant right subclavian artery noted. The heart size is normal. There is no pericardial effusion. Mediastinum/Nodes: The previously demonstrated bilateral paracardiac nodes have improved. Node at the right cardiophrenic angle measures 8 mm short axis on image 43/2 (previously 19 mm). Left pericardiac node measures 9 mm on image 48/2 (previously 13 mm). Additional small mediastinal and axillary lymph nodes bilaterally are stable. There is mild  distal esophageal wall thickening. The trachea appears unremarkable. Lungs/Pleura: There is no  pleural effusion. Mild dependent atelectasis at both lung bases. No suspicious pulmonary nodules. Musculoskeletal/Chest wall: No chest wall mass or suspicious osseous findings. CT ABDOMEN AND PELVIS FINDINGS Hepatobiliary: Hepatic steatosis as before. No focal hepatic lesion or abnormal enhancement. No significant biliary dilatation status post cholecystectomy. Pancreas: Unremarkable. No pancreatic ductal dilatation or surrounding inflammatory changes. Spleen: Normal in size without focal abnormality. Adrenals/Urinary Tract: Both adrenal glands appear normal. There is a stable cyst in the interpolar region of the right kidney. No evidence of renal mass, urinary tract calculus or hydronephrosis. No significant bladder findings. Stomach/Bowel: No evidence of bowel wall thickening, distention or surrounding inflammatory change. There is peripheral displacement of the bowel by the large bilateral adnexal lesions. Vascular/Lymphatic: There are no enlarged abdominal or pelvic lymph nodes. There are stable small inguinal lymph nodes bilaterally mild aortic and branch vessel atherosclerosis. There is flattening of the IVC without evidence of thrombosis. Reproductive: Previous hysterectomy. Large complex bilateral adnexal masses are again noted. The right adnexal lesion measures up to 21.5 x 13.6 cm transverse (image 77/2) and extends 24.7 cm cephalocaudad. The irregular solid components along the inferolateral aspect of this lesion have not significantly changed. Left adnexal lesion measures 13.0 x 7.3 x 13.6 cm and demonstrates increased density within a dependent component which measures 2.2 x 2.5 cm transverse on image 102/2. Other: Omental nodularity is similar to the previous studies, hypermetabolic on PET-CT. Largest component measures up to 5.3 x 1.8 cm on image 99/2. No significant associated ascites. Musculoskeletal: No acute or significant osseous findings. IMPRESSION: 1. No significant change in size of the large  complex bilateral adnexal masses consistent with the known history of ovarian cancer. There is increased dependent density within the left-sided lesion. 2. Stable peritoneal carcinomatosis.  No significant ascites. 3. The bilateral pericardiac adenopathy has improved. No progressive thoracic metastatic disease. No pulmonary or osseous metastatic disease. 4. Suspicion of nonocclusive thrombus in the right internal jugular vein related to the right IJ port. Recommend further evaluation with Doppler ultrasound. 5. These results will be called to the ordering clinician or representative by the Radiologist Assistant, and communication documented in the PACS or zVision Dashboard. Electronically Signed   By: Richardean Sale M.D.   Ct Abdomen Pelvis W Contrast  Result Date: 12/10/2017 CLINICAL DATA:  Ovarian cancer.  Follow-up. EXAM: CT ABDOMEN AND PELVIS WITH CONTRAST TECHNIQUE: Multidetector CT imaging of the abdomen and pelvis was performed using the standard protocol following bolus administration of intravenous contrast. CONTRAST:  124m OMNIPAQUE IOHEXOL 300 MG/ML  SOLN COMPARISON:  10/31/2017 FINDINGS: Lower chest: No acute abnormality. Hepatobiliary: There is no focal liver abnormality identified. Previous cholecystectomy. No biliary ductal dilatation. Pancreas: Unremarkable. No pancreatic ductal dilatation or surrounding inflammatory changes. Spleen: There is a peritoneal deposit along the anterior aspect of the spleen which is new from previous exam measuring 2.6 cm, image 46/6. Adrenals/Urinary Tract: The adrenal glands appear normal. Cyst within the posterolateral cortex of the right kidney measures 2.6 cm. Urinary bladder appears normal. Stomach/Bowel: The stomach appears normal. Mild increase caliber of the small bowel loops identified measuring up to 2.4 cm. No pathologic dilatation of the colon. Vascular/Lymphatic: Aortic atherosclerosis noted. No aneurysm. Prominent abdominal retroperitoneal lymph nodes  identified without adenopathy. No pelvic adenopathy. Reproductive: Status post hysterectomy. Interval surgical resection of large bilateral adnexal tumors. Residual left adnexal tumor measures 2.8 by 4.3 cm, image 66/2. On the right this measures 2.3  by 1.2 cm. Other: The large omental cake is no longer visualized and may reflect interval omentectomy. Left upper quadrant peritoneal deposit is again identified noted and new from previous exam, image 11/2 and image 46/6. Musculoskeletal: Degenerative disc disease noted within the lumbar spine. No aggressive lytic or sclerotic bone lesions. IMPRESSION: 1. Interval resection of large bilateral adnexal cystic masses. Residual soft tissue/tumor within bilateral adnexal regions as above. 2. Interval resolution of previously noted omental cake which may reflect interval omentectomy. There is a new peritoneal deposit identified within the left upper quadrant of the abdomen involving the anterior surface of the spleen. Electronically Signed   By: Kerby Moors M.D.   On: 12/10/2017 14:55      Oncologic History:    Oncology History   ER 15-20%, PR 5-10%, Her2/neu negative MMR normal Negative genetic testing HRD pos BRCA1 positive MSI Stable     Right ovarian epithelial cancer ([place])   07/30/2017 Imaging    US pelvis Ultrasound revealed a complex cystic mass in the right adnexa measuring 20 x 11 x 12 cm with multiple internal septations some of which are thick. The left adnexa measured 12.7 x 11.6 x 8.1 with low level echoes and soft tissue nodules     07/30/2017 Tumor Marker    Patient's tumor was tested for the following markers: CA-125 Results of the tumor marker test revealed 521.3    08/17/2017 Pathology Results    The malignant cells are positive for PAX-8, cytokeratin 7, estrogen receptor, and faintly positive for GATA-3. They are negative for p53, GCDFP, and cytokeratin 20. The finding are consistent with a gynecologic primary carcinoma.  Additional studies can be performed upon clinician request.    08/17/2017 Procedure    Technically successful CT-guided left lower quadrant omental mass core biopsy.    08/20/2017 PET scan    1. Cystic masses arising from the pelvis. The nodular component of the RIGHT cystic mass is intensely hypermetabolic consistent with malignant ovarian neoplasm. 2. Extensive hypermetabolic peritoneal thickening in the lower abdomen and upper pelvis, upper abdomen, and upper abdominal precordial fat and paradiaphragmatic fat. 3. Retroperitoneal nodal metastasis adjacent to the IVC at the level the kidneys. 4. No evidence of metastatic disease in the thorax other small effusion on the LEFT and nodal metastasis in the fat superior to the diaphragm. 5. Mild metabolic activity associated the distal esophagus is favored benign esophagitis.    08/20/2017 Imaging    CT chest 1. Bilateral cardiophrenic angle nodal metastasis. No additional findings to suggest metastatic disease to the chest. 2. Small left pleural effusion. 3. Peritoneal carcinomatosis noted within the abdomen. 4. Hepatic steatosis.     08/24/2017 Cancer Staging    Staging form: Ovary, Fallopian Tube, and Primary Peritoneal Carcinoma, AJCC 8th Edition - Clinical: Stage IV (cT3, cN1, cM1) - Signed by [name], MD on 08/24/2017    08/31/2017 Tumor Marker    Patient's tumor was tested for the following markers: CA-125 Results of the tumor marker test revealed 819.9    09/26/2017 Adverse Reaction    She developed reaction to Taxol, managed successfully with additional premedications    10/17/2017 Tumor Marker    Patient's tumor was tested for the following markers: CA-125 Results of the tumor marker test revealed 374.4    10/31/2017 Imaging    1. No significant change in size of the large complex bilateral adnexal masses consistent with the known history of ovarian cancer. There is increased dependent density within the left-sided lesion.  2.  Stable peritoneal carcinomatosis.  No significant ascites.  3. The bilateral pericardiac adenopathy has improved. No progressive thoracic metastatic disease. No pulmonary or osseous metastatic disease. 4. Suspicion of nonocclusive thrombus in the right internal jugular vein related to the right IJ port. Recommend further evaluation with Doppler ultrasound.    11/13/2017 Surgery    Preoperative Diagnosis: Ovarian cancer s/p neoadjuvant chemotherapy   Procedure(s) Performed:   4. Exploratory laparotomy  5. Bilateral salpingo-oophorectomy with radical tumor debulking for ovarian cancer   including retroperitoneal dissection, lysis of adhesions (enterolysis of small bowel in deep pelvis, left adnexal adhesions to sigmoid and omentum to LLQ) ~1 hour, ureterolysis. 3. Omentectomy  4. Takedown of splenic flexure with removal of omental tumor in left upper quadrant  Specimens: Bilateral tubes and ovaries, omentum, splenic flexure nodule, right paracolic gutter nodule, cecal nodule, right ureteral peritoneal nodule, small bowel mesentary.  Indication for Procedure:  Patient is s/p 3 cycles of chemotherapy with response based on CA125 and decreased mediastinal disease (to <1cm).  Operative Findings:  This represented an optimal cytoreduction with gross residual disease remaining in the deep pelvis near the levator floor and possibly in the region of the right IP ligament/periappendicial region.  Upon entry a large ~20cm right tube/ovary, mostly cystic. Adherent rind along ileocecal region and appendix. Large cystic left tube/ovary ~10cm with sigmoid colon stretched across the mass and extension of the cystic lesion into the deep pelvis with residual palpable disease deep pelvis near levators. Estimate ~1cm or less on palpation, not visible disease. Omental caking in the LLQ adherent to pelvic sidewall. Omental disease noted RUQ separate. In addition ~1.5cm lesion in splenic flexure omentum.  Evidence of prior diaphragmatic disease, no visible disease.       11/13/2017 Pathology Results    1. 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 - TUMOR INVOLVES THE OVARIAN SURFACE. - RIGHT FALLOPIAN TUBE IS INVOLVED. - SEE ONCOLOGY TABLE. - SEE NOTE. 2. Soft tissue mass, biopsy, cecal gutter nodule - METASTATIC MUCINOUS ADENOCARCINOMA. 3. Ovary and fallopian tube, left, left ovary and fallopian tube - METASTATIC MUCINOUS ADENOCARCINOMA TO LEFT OVARY AND FALLOPIAN TUBE. 4. Omentum, resection for tumor - METASTATIC MUCINOUS ADENOCARCINOMA TO OMENTUM. 5. Soft tissue mass, biopsy, splenic flexure nodule - METASTATIC MUCINOUS ADENOCARCINOMA. 6. Soft tissue mass, biopsy, cecal implant - METASTATIC MUCINOUS ADENOCARCINOMA. 7. Soft tissue mass, biopsy, right ureteral peritoneal implant - METASTATIC MUCINOUS ADENOCARCINOMA. 8. Mesentery, small bowel nodule - METASTATIC MUCINOUS ADENOCARCINOMA. Microscopic Comment 1. OVARY or FALLOPIAN TUBE or PRIMARY PERITONEUM: Procedure: Bilateral salpingo-oophorectomy. Specimen Integrity: Intact. Tumor Site: Right ovary. Ovarian Surface Involvement: Present. Fallopian Tube Surface Involvement: Present. Tumor Size: 20 cm. Histologic Type: Mucinous adenocarcinoma. Histologic Grade: Overall G2 (moderately differentiated) (focal areas of poor differentiation are present). Implants: Present. Other Tissue/ Organ Involvement: Left ovary, left fallopian tube and omentum. Largest Extrapelvic Peritoneal Focus: less than 2 cm. See note Peritoneal/Ascitic Fluid: N/A Treatment Effect: No definite or minimal response identified (chemotherapy response score 1 [CRS 1]) Regional Lymph Nodes No lymph nodes submitted or found Number of Lymph Nodes Examined: 0 Pathologic Stage Classification (pTNM, AJCC 8th Edition): ypT3b, ypNX. Representative Tumor Block: 1D and 1E. (NDK:gt, 11/15/17)    11/19/2017 Genetic  Testing    Negative genetic testing on the [employer] Myrisk panel.  The [facility] gene panel offered by [employer] includes sequencing and deletion/duplication testing of the following 35 genes: APC, ATM, AXIN2, BARD1, BMPR1A, BRCA1, BRCA2, BRIP1,  CHD1, CDK4, CDKN2A, CHEK2, EPCAM (large rearrangement only), HOXB13, (sequencing only), GALNT12, MLH1, MSH2, MSH3 (excluding repetitive portions of exon 1), MSH6, MUTYH, NBN, NTHL1, PALB2, PMS2, PTEN, RAD51C, RAD51D, RNF43, RPS20, SMAD4, STK11, and TP53. Sequencing was performed for select regions of POLE and POLD1, and large rearrangement analysis was performed for select regions of GREM1. The report date is November 19, 2017.  HRD tumor results indicate a BRCA1 mutation identified in the ovarian tumor causing genomic instability. The report date is 12/04/2017      Genetic Testing    Patient has genetic testing done for ER/PR and Her2/neu. Results revealed patient has the following: ER 15-20% PR 5-10% Her2/neu - negative     Genetic Testing    Patient has genetic testing done for MSI/MMR. Results revealed patient has the following mutation(s): MMR normal, MSI stable    12/10/2017 Imaging    1. Interval resection of large bilateral adnexal cystic masses. Residual soft tissue/tumor within bilateral adnexal regions as above. 2. Interval resolution of previously noted omental cake which may reflect interval omentectomy. There is a new peritoneal deposit identified within the left upper quadrant of the abdomen involving the anterior surface of the spleen.    12/10/2017 Tumor Marker    Patient's tumor was tested for the following markers: CA-125 Results of the tumor marker test revealed 94.9    12/11/2017 -  Chemotherapy    The patient had FOLFOX regimen for mucinous     Current Meds:  Outpatient Encounter Medications as of 12/28/2017  Medication Sig  . acetaminophen (TYLENOL) 500 MG tablet Take 500 mg by mouth every 8 (eight) hours as needed  for mild pain or moderate pain.  Marland Kitchen atenolol (TENORMIN) 25 MG tablet Take 25 mg by mouth at bedtime.   Marland Kitchen CALCIUM-MAGNESIUM-VITAMIN D PO Take 1 tablet by mouth daily.  . famotidine (PEPCID) 20 MG tablet Take 20 mg by mouth as needed for heartburn or indigestion.  . lidocaine-prilocaine (EMLA) cream Apply 1 application topically as needed.  . loratadine (CLARITIN) 10 MG tablet Take 10 mg by mouth daily as needed for allergies.  . metFORMIN (GLUCOPHAGE) 1000 MG tablet Take 500 mg by mouth 2 (two) times daily.   . Multiple Vitamin (MULTIVITAMIN WITH MINERALS) TABS tablet Take 1 tablet by mouth daily.  . ondansetron (ZOFRAN) 8 MG tablet Take 1 tablet (8 mg total) by mouth every 8 (eight) hours as needed for nausea.  . prochlorperazine (COMPAZINE) 10 MG tablet Take 1 tablet (10 mg total) by mouth every 6 (six) hours as needed for nausea or vomiting.   No facility-administered encounter medications on file as of 12/28/2017.     Allergy: No Known Allergies  Social Hx:   Tobacco : never Alcohol: 5 EtOH per week Illicit Drug use: none  Past Surgical Hx:  Past Surgical History:  Procedure Laterality Date  . CHOLECYSTECTOMY N/A 07/06/2014   Procedure: LAPAROSCOPIC CHOLECYSTECTOMY;  Surgeon: Aviva Signs Md, MD;  Location: [facility];  Service: General;  Laterality: N/A;  . COLONOSCOPY N/A 06/26/2013   Procedure: COLONOSCOPY;  Surgeon: Rogene Houston, MD;  Location: [facility];  Service: Endoscopy;  Laterality: N/A;  1030  . DEBULKING N/A 11/13/2017   Procedure: DEBULKING;  Surgeon: Isabel Caprice, MD;  Location: [facility];  Service: Gynecology;  Laterality: N/A;  . IR FLUORO GUIDE PORT INSERTION RIGHT  09/03/2017  . IR US GUIDE VASC ACCESS RIGHT  09/03/2017  . LAPAROTOMY N/A 11/13/2017   Procedure: EXPLORATORY LAPAROTOMY;  Surgeon:  Isabel Caprice, MD;  Location: [facility];  Service: Gynecology;  Laterality: N/A;  . SALPINGOOPHORECTOMY Bilateral 11/13/2017   Procedure: BILATERAL SALPINGO OOPHORECTOMY;   Surgeon: Isabel Caprice, MD;  Location: [facility];  Service: Gynecology;  Laterality: Bilateral;  . VAGINAL HYSTERECTOMY  2011    Past Medical Hx:  Past Medical History:  Diagnosis Date  . Diabetes mellitus without complication ([place])    TYPE 2  . Diverticulosis   . Family history of cancer   . Hypertension   . Osteopenia   . Ovarian cancer ([place])   . Pancreatic cyst     Past Gynecological History:   GYNECOLOGIC HISTORY:  No LMP recorded. Patient has had a hysterectomy. Menarche: 67 years old P 101 LMP 67 years old Contraceptive; yes, <10 years HRT <1 year  Last Pap s/p vaginal hysterectomy 2011 for "prolapse"  Family Hx:  Family History  Problem Relation Age of Onset  . Colon cancer Mother 72       colon ca  . Skin cancer Father 76       unsure type  . Heart attack Father 58       d. 59  . Stroke Maternal Grandmother   . Brain cancer Maternal Grandfather 72  . Dementia Maternal Aunt   . Diabetes Maternal Aunt   . Skin cancer Maternal Uncle   . Heart attack Paternal Aunt   . Fibromyalgia Sister   . Diabetes Sister   . Cancer Other 101       possible ovarian cancer, d. 102  . Prostate cancer Cousin        pat first cousin    Review of Systems:  Review of Systems  All other systems reviewed and are negative. Chemo induced hair loss  Vitals:  Blood pressure (!) 141/84, pulse 68, temperature 98.5 F (36.9 C), temperature source Oral, resp. rate 18, height 5' 5"  (1.651 m), weight 139 lb 12.8 oz (63.4 kg), SpO2 100 %. Body mass index is 23.26 kg/m.   Physical Exam: ECOG PERFORMANCE STATUS: 1 - Symptomatic but completely ambulatory  General :  Well developed, 67 y.o., female in no apparent distress HEENT:  Normocephalic/atraumatic, symmetric, EOMI, eyelids normal Neck:   No visible masses. No palpable adenopathy Respiratory:  Respirations unlabored, no use of accessory muscles CV:   Deferred Breast:  Deferred Musculoskeletal: Normal muscle strength.    Abdomen:  Wound healed. Nontender, nondistended. No masses Extremities:  No erythema , no pain , no edema Skin:   Normal inspection.  No rash Neuro/Psych:  No focal motor deficit, no abnormal mental status. Normal gait. Normal affect. Alert and oriented to person, place, and time  Genito Urinary  12/28/17 Vulva: Normal external female genitalia.  Bladder/urethra: Urethral meatus normal in size and location. No lesions or   masses, well supported bladder Bimanual exam: Cervix/Uterus/Adnexa: Surgically absent  No masses Rectovaginal:  Good tone, no cul de sac nodularity, no parametrial involvement or nodularity. At the tip of my rectal digit I feel a nodule but this is higher than I expected for the disease that was felt residual in the culdesac. This could be stool in a separate bowel loop. 08/03/17: Bimanual exam: palpable fullness but no specific nodularity Rectovaginal:  Good tone, fullness in the cul-de-sac but no cul de sac nodularity, no parametrial involvement or nodularity.  The rectal wall feels smooth but there is some mild compression anteriorly from the mass in the pelvis which is mildly mobile.  This compression starts  at approximately 7-8 cm in from the anal verge.  Oncologic Summary: 1. Ovarian adenocarcinoma  2. Cardiophrenic lymphadenopathy    Assessment/Plan: 1. Management o I agree with a change to GI based regimen; importantly there is still a platinum in the regimen  o Dr. Alvy Bimler and I had talked about PARP and perhaps after her planned end of FOLFOX she can be placed on PARP regardless of MOD 2. Followup o She should have a pelvic about 3 months from surgery, or we talked about doing that today and then having her come back in 3 months  Face to face time with patient was 15 minutes. Over 50% of this time was spent on counseling and coordination of care.   Isabel Caprice, MD  12/28/2017, 12:29 PM  Cc: Sinda Du, Linna Hoff, [place] (PCP) Gari Crown, MD  (Referring OB/Gyn) [name], MD (Medical Oncology)

## 2017-12-28 ENCOUNTER — Encounter: Payer: Self-pay | Admitting: Obstetrics

## 2017-12-28 ENCOUNTER — Other Ambulatory Visit: Payer: Medicare HMO

## 2017-12-28 ENCOUNTER — Telehealth: Payer: Self-pay | Admitting: Hematology and Oncology

## 2017-12-28 ENCOUNTER — Inpatient Hospital Stay: Payer: Medicare HMO

## 2017-12-28 ENCOUNTER — Inpatient Hospital Stay (HOSPITAL_BASED_OUTPATIENT_CLINIC_OR_DEPARTMENT_OTHER): Payer: Medicare HMO | Admitting: Obstetrics

## 2017-12-28 VITALS — BP 141/84 | HR 68 | Temp 98.5°F | Resp 18 | Ht 65.0 in | Wt 139.8 lb

## 2017-12-28 VITALS — BP 148/78 | HR 68 | Temp 98.2°F | Resp 17

## 2017-12-28 DIAGNOSIS — Z90722 Acquired absence of ovaries, bilateral: Secondary | ICD-10-CM | POA: Diagnosis not present

## 2017-12-28 DIAGNOSIS — Z90710 Acquired absence of both cervix and uterus: Secondary | ICD-10-CM

## 2017-12-28 DIAGNOSIS — C786 Secondary malignant neoplasm of retroperitoneum and peritoneum: Secondary | ICD-10-CM

## 2017-12-28 DIAGNOSIS — Z452 Encounter for adjustment and management of vascular access device: Secondary | ICD-10-CM | POA: Diagnosis not present

## 2017-12-28 DIAGNOSIS — Z23 Encounter for immunization: Secondary | ICD-10-CM | POA: Diagnosis not present

## 2017-12-28 DIAGNOSIS — R590 Localized enlarged lymph nodes: Secondary | ICD-10-CM

## 2017-12-28 DIAGNOSIS — Z5111 Encounter for antineoplastic chemotherapy: Secondary | ICD-10-CM | POA: Diagnosis not present

## 2017-12-28 DIAGNOSIS — C561 Malignant neoplasm of right ovary: Secondary | ICD-10-CM | POA: Diagnosis not present

## 2017-12-28 DIAGNOSIS — E119 Type 2 diabetes mellitus without complications: Secondary | ICD-10-CM | POA: Diagnosis not present

## 2017-12-28 DIAGNOSIS — G620 Drug-induced polyneuropathy: Secondary | ICD-10-CM | POA: Diagnosis not present

## 2017-12-28 MED ORDER — HEPARIN SOD (PORK) LOCK FLUSH 100 UNIT/ML IV SOLN
500.0000 [IU] | Freq: Once | INTRAVENOUS | Status: AC | PRN
  Administered 2017-12-28: 500 [IU]
  Filled 2017-12-28: qty 5

## 2017-12-28 MED ORDER — SODIUM CHLORIDE 0.9% FLUSH
10.0000 mL | INTRAVENOUS | Status: DC | PRN
  Administered 2017-12-28: 10 mL
  Filled 2017-12-28: qty 10

## 2017-12-28 NOTE — Patient Instructions (Signed)
1. Return in  3 months for pelvic 2. Chemo per Dr. Alvy Bimler

## 2017-12-28 NOTE — Telephone Encounter (Signed)
Called patient regarding appt on 9/18.  Patient was coming to hospital for another appt and stopped by to get new schedule.

## 2018-01-09 ENCOUNTER — Inpatient Hospital Stay: Payer: Medicare HMO

## 2018-01-09 VITALS — BP 128/69 | HR 72 | Temp 98.4°F | Resp 18

## 2018-01-09 DIAGNOSIS — C561 Malignant neoplasm of right ovary: Secondary | ICD-10-CM | POA: Diagnosis not present

## 2018-01-09 DIAGNOSIS — Z90710 Acquired absence of both cervix and uterus: Secondary | ICD-10-CM | POA: Diagnosis not present

## 2018-01-09 DIAGNOSIS — G620 Drug-induced polyneuropathy: Secondary | ICD-10-CM | POA: Diagnosis not present

## 2018-01-09 DIAGNOSIS — Z90722 Acquired absence of ovaries, bilateral: Secondary | ICD-10-CM | POA: Diagnosis not present

## 2018-01-09 DIAGNOSIS — Z23 Encounter for immunization: Secondary | ICD-10-CM | POA: Diagnosis not present

## 2018-01-09 DIAGNOSIS — Z5111 Encounter for antineoplastic chemotherapy: Secondary | ICD-10-CM | POA: Diagnosis not present

## 2018-01-09 DIAGNOSIS — E119 Type 2 diabetes mellitus without complications: Secondary | ICD-10-CM | POA: Diagnosis not present

## 2018-01-09 DIAGNOSIS — C786 Secondary malignant neoplasm of retroperitoneum and peritoneum: Secondary | ICD-10-CM | POA: Diagnosis not present

## 2018-01-09 DIAGNOSIS — R590 Localized enlarged lymph nodes: Secondary | ICD-10-CM

## 2018-01-09 DIAGNOSIS — Z452 Encounter for adjustment and management of vascular access device: Secondary | ICD-10-CM | POA: Diagnosis not present

## 2018-01-09 LAB — CMP (CANCER CENTER ONLY)
ALBUMIN: 3.6 g/dL (ref 3.5–5.0)
ALK PHOS: 59 U/L (ref 38–126)
ALT: 13 U/L (ref 0–44)
ANION GAP: 10 (ref 5–15)
AST: 18 U/L (ref 15–41)
BILIRUBIN TOTAL: 0.2 mg/dL — AB (ref 0.3–1.2)
BUN: 17 mg/dL (ref 8–23)
CALCIUM: 9.2 mg/dL (ref 8.9–10.3)
CO2: 25 mmol/L (ref 22–32)
Chloride: 107 mmol/L (ref 98–111)
Creatinine: 0.83 mg/dL (ref 0.44–1.00)
GFR, Est AFR Am: >60 mL/min (ref >60)
GFR, Estimated: >60 mL/min (ref >60)
GLUCOSE: 157 mg/dL — AB (ref 70–99)
Potassium: 4 mmol/L (ref 3.5–5.1)
Sodium: 142 mmol/L (ref 135–145)
Total Protein: 7.2 g/dL (ref 6.5–8.1)

## 2018-01-09 LAB — CBC WITH DIFFERENTIAL (CANCER CENTER ONLY)
BASOS ABS: 0 10*3/uL (ref 0.0–0.1)
BASOS PCT: 1 %
Eosinophils Absolute: 0.1 10*3/uL (ref 0.0–0.5)
Eosinophils Relative: 2 %
HEMATOCRIT: 36.9 % (ref 34.8–46.6)
Hemoglobin: 11.8 g/dL (ref 11.6–15.9)
Lymphocytes Relative: 36 %
Lymphs Abs: 2 10*3/uL (ref 0.9–3.3)
MCH: 30.6 pg (ref 25.1–34.0)
MCHC: 32 g/dL (ref 31.5–36.0)
MCV: 95.6 fL (ref 79.5–101.0)
MONO ABS: 0.6 10*3/uL (ref 0.1–0.9)
Monocytes Relative: 11 %
NEUTROS ABS: 2.8 10*3/uL (ref 1.5–6.5)
Neutrophils Relative %: 50 %
Platelet Count: 141 10*3/uL — ABNORMAL LOW (ref 145–400)
RBC: 3.86 MIL/uL (ref 3.70–5.45)
RDW: 14.1 % (ref 11.2–14.5)
WBC Count: 5.4 10*3/uL (ref 3.9–10.3)

## 2018-01-09 MED ORDER — FLUOROURACIL CHEMO INJECTION 500 MG/10ML
200.0000 mg/m2 | Freq: Once | INTRAVENOUS | Status: AC
  Administered 2018-01-09: 350 mg via INTRAVENOUS
  Filled 2018-01-09: qty 7

## 2018-01-09 MED ORDER — PALONOSETRON HCL INJECTION 0.25 MG/5ML
INTRAVENOUS | Status: AC
  Filled 2018-01-09: qty 5

## 2018-01-09 MED ORDER — SODIUM CHLORIDE 0.9% FLUSH
10.0000 mL | INTRAVENOUS | Status: DC | PRN
  Filled 2018-01-09: qty 10

## 2018-01-09 MED ORDER — DEXAMETHASONE SODIUM PHOSPHATE 10 MG/ML IJ SOLN
INTRAMUSCULAR | Status: AC
  Filled 2018-01-09: qty 1

## 2018-01-09 MED ORDER — SODIUM CHLORIDE 0.9 % IV SOLN
2400.0000 mg/m2 | INTRAVENOUS | Status: DC
  Administered 2018-01-09: 4200 mg via INTRAVENOUS
  Filled 2018-01-09: qty 84

## 2018-01-09 MED ORDER — OXALIPLATIN CHEMO INJECTION 100 MG/20ML
68.0000 mg/m2 | Freq: Once | INTRAVENOUS | Status: AC
  Administered 2018-01-09: 120 mg via INTRAVENOUS
  Filled 2018-01-09: qty 24

## 2018-01-09 MED ORDER — LEUCOVORIN CALCIUM INJECTION 350 MG
400.0000 mg/m2 | Freq: Once | INTRAVENOUS | Status: AC
  Administered 2018-01-09: 700 mg via INTRAVENOUS
  Filled 2018-01-09: qty 35

## 2018-01-09 MED ORDER — DEXTROSE 5 % IV SOLN
Freq: Once | INTRAVENOUS | Status: AC
  Administered 2018-01-09: 10:00:00 via INTRAVENOUS
  Filled 2018-01-09: qty 250

## 2018-01-09 MED ORDER — HEPARIN SOD (PORK) LOCK FLUSH 100 UNIT/ML IV SOLN
500.0000 [IU] | Freq: Once | INTRAVENOUS | Status: DC | PRN
  Filled 2018-01-09: qty 5

## 2018-01-09 MED ORDER — INFLUENZA VAC SPLIT QUAD 0.5 ML IM SUSY
0.5000 mL | PREFILLED_SYRINGE | Freq: Once | INTRAMUSCULAR | Status: AC
  Administered 2018-01-09: 0.5 mL via INTRAMUSCULAR

## 2018-01-09 MED ORDER — DEXTROSE 5 % IV SOLN
INTRAVENOUS | Status: DC
  Administered 2018-01-09: 10:00:00 via INTRAVENOUS
  Filled 2018-01-09: qty 250

## 2018-01-09 MED ORDER — PALONOSETRON HCL INJECTION 0.25 MG/5ML
0.2500 mg | Freq: Once | INTRAVENOUS | Status: AC
  Administered 2018-01-09: 0.25 mg via INTRAVENOUS

## 2018-01-09 MED ORDER — INFLUENZA VAC SPLIT QUAD 0.5 ML IM SUSY
PREFILLED_SYRINGE | INTRAMUSCULAR | Status: AC
  Filled 2018-01-09: qty 0.5

## 2018-01-09 MED ORDER — DEXAMETHASONE SODIUM PHOSPHATE 10 MG/ML IJ SOLN
10.0000 mg | Freq: Once | INTRAMUSCULAR | Status: AC
  Administered 2018-01-09: 10 mg via INTRAVENOUS

## 2018-01-09 NOTE — Patient Instructions (Signed)
[facility] Discharge Instructions for Patients Receiving Chemotherapy  Today you received the following chemotherapy agents Oxaliplatin, Leucovorin & Fluorouracil  To help prevent nausea and vomiting after your treatment, we encourage you to take your nausea medication as prescribed.   If you develop nausea and vomiting that is not controlled by your nausea medication, call the clinic.   BELOW ARE SYMPTOMS THAT SHOULD BE REPORTED IMMEDIATELY:  *FEVER GREATER THAN 100.5 F  *CHILLS WITH OR WITHOUT FEVER  NAUSEA AND VOMITING THAT IS NOT CONTROLLED WITH YOUR NAUSEA MEDICATION  *UNUSUAL SHORTNESS OF BREATH  *UNUSUAL BRUISING OR BLEEDING  TENDERNESS IN MOUTH AND THROAT WITH OR WITHOUT PRESENCE OF ULCERS  *URINARY PROBLEMS  *BOWEL PROBLEMS  UNUSUAL RASH Items with * indicate a potential emergency and should be followed up as soon as possible.  Feel free to call the clinic should you have any questions or concerns. The clinic phone number is [phone].  Please show the [place] at check-in to the Emergency Department and triage nurse.

## 2018-01-09 NOTE — Patient Instructions (Signed)
Implanted Port Home Guide An implanted port is a type of central line that is placed under the skin. Central lines are used to provide IV access when treatment or nutrition needs to be given through a person's veins. Implanted ports are used for long-term IV access. An implanted port may be placed because:  You need IV medicine that would be irritating to the small veins in your hands or arms.  You need long-term IV medicines, such as antibiotics.  You need IV nutrition for a long period.  You need frequent blood draws for lab tests.  You need dialysis.  Implanted ports are usually placed in the chest area, but they can also be placed in the upper arm, the abdomen, or the leg. An implanted port has two main parts:  Reservoir. The reservoir is round and will appear as a small, raised area under your skin. The reservoir is the part where a needle is inserted to give medicines or draw blood.  Catheter. The catheter is a thin, flexible tube that extends from the reservoir. The catheter is placed into a large vein. Medicine that is inserted into the reservoir goes into the catheter and then into the vein.  How will I care for my incision site? Do not get the incision site wet. Bathe or shower as directed by your health care provider. How is my port accessed? Special steps must be taken to access the port:  Before the port is accessed, a numbing cream can be placed on the skin. This helps numb the skin over the port site.  Your health care provider uses a sterile technique to access the port. ? Your health care provider must put on a mask and sterile gloves. ? The skin over your port is cleaned carefully with an antiseptic and allowed to dry. ? The port is gently pinched between sterile gloves, and a needle is inserted into the port.  Only "non-coring" port needles should be used to access the port. Once the port is accessed, a blood return should be checked. This helps ensure that the port  is in the vein and is not clogged.  If your port needs to remain accessed for a constant infusion, a clear (transparent) bandage will be placed over the needle site. The bandage and needle will need to be changed every week, or as directed by your health care provider.  Keep the bandage covering the needle clean and dry. Do not get it wet. Follow your health care provider's instructions on how to take a shower or bath while the port is accessed.  If your port does not need to stay accessed, no bandage is needed over the port.  What is flushing? Flushing helps keep the port from getting clogged. Follow your health care provider's instructions on how and when to flush the port. Ports are usually flushed with saline solution or a medicine called heparin. The need for flushing will depend on how the port is used.  If the port is used for intermittent medicines or blood draws, the port will need to be flushed: ? After medicines have been given. ? After blood has been drawn. ? As part of routine maintenance.  If a constant infusion is running, the port may not need to be flushed.  How long will my port stay implanted? The port can stay in for as long as your health care provider thinks it is needed. When it is time for the port to come out, surgery will be   done to remove it. The procedure is similar to the one performed when the port was put in. When should I seek immediate medical care? When you have an implanted port, you should seek immediate medical care if:  You notice a bad smell coming from the incision site.  You have swelling, redness, or drainage at the incision site.  You have more swelling or pain at the port site or the surrounding area.  You have a fever that is not controlled with medicine.  This information is not intended to replace advice given to you by your health care provider. Make sure you discuss any questions you have with your health care provider. Document  Released: 04/10/2005 Document Revised: 09/16/2015 Document Reviewed: 12/16/2012 Elsevier Interactive Patient Education  2017 [employer]  

## 2018-01-10 LAB — CA 125: Cancer Antigen (CA) 125: 37 U/mL (ref 0.0–38.1)

## 2018-01-11 ENCOUNTER — Inpatient Hospital Stay: Payer: Medicare HMO

## 2018-01-11 VITALS — BP 131/69 | HR 66 | Temp 97.9°F | Resp 18

## 2018-01-11 DIAGNOSIS — Z23 Encounter for immunization: Secondary | ICD-10-CM | POA: Diagnosis not present

## 2018-01-11 DIAGNOSIS — C786 Secondary malignant neoplasm of retroperitoneum and peritoneum: Secondary | ICD-10-CM | POA: Diagnosis not present

## 2018-01-11 DIAGNOSIS — Z452 Encounter for adjustment and management of vascular access device: Secondary | ICD-10-CM | POA: Diagnosis not present

## 2018-01-11 DIAGNOSIS — G620 Drug-induced polyneuropathy: Secondary | ICD-10-CM | POA: Diagnosis not present

## 2018-01-11 DIAGNOSIS — C561 Malignant neoplasm of right ovary: Secondary | ICD-10-CM

## 2018-01-11 DIAGNOSIS — Z90710 Acquired absence of both cervix and uterus: Secondary | ICD-10-CM | POA: Diagnosis not present

## 2018-01-11 DIAGNOSIS — R590 Localized enlarged lymph nodes: Secondary | ICD-10-CM

## 2018-01-11 DIAGNOSIS — Z90722 Acquired absence of ovaries, bilateral: Secondary | ICD-10-CM | POA: Diagnosis not present

## 2018-01-11 DIAGNOSIS — Z5111 Encounter for antineoplastic chemotherapy: Secondary | ICD-10-CM | POA: Diagnosis not present

## 2018-01-11 DIAGNOSIS — E119 Type 2 diabetes mellitus without complications: Secondary | ICD-10-CM | POA: Diagnosis not present

## 2018-01-11 MED ORDER — HEPARIN SOD (PORK) LOCK FLUSH 100 UNIT/ML IV SOLN
500.0000 [IU] | Freq: Once | INTRAVENOUS | Status: AC | PRN
  Administered 2018-01-11: 500 [IU]
  Filled 2018-01-11: qty 5

## 2018-01-11 MED ORDER — SODIUM CHLORIDE 0.9% FLUSH
10.0000 mL | INTRAVENOUS | Status: DC | PRN
  Administered 2018-01-11: 10 mL
  Filled 2018-01-11: qty 10

## 2018-01-23 ENCOUNTER — Inpatient Hospital Stay: Payer: Medicare HMO | Attending: Obstetrics

## 2018-01-23 ENCOUNTER — Inpatient Hospital Stay: Payer: Medicare HMO

## 2018-01-23 ENCOUNTER — Inpatient Hospital Stay: Payer: Medicare HMO | Admitting: Hematology and Oncology

## 2018-01-23 ENCOUNTER — Encounter: Payer: Self-pay | Admitting: Hematology and Oncology

## 2018-01-23 DIAGNOSIS — G620 Drug-induced polyneuropathy: Secondary | ICD-10-CM

## 2018-01-23 DIAGNOSIS — Z5111 Encounter for antineoplastic chemotherapy: Secondary | ICD-10-CM | POA: Diagnosis present

## 2018-01-23 DIAGNOSIS — D696 Thrombocytopenia, unspecified: Secondary | ICD-10-CM | POA: Diagnosis not present

## 2018-01-23 DIAGNOSIS — C561 Malignant neoplasm of right ovary: Secondary | ICD-10-CM

## 2018-01-23 DIAGNOSIS — R590 Localized enlarged lymph nodes: Secondary | ICD-10-CM

## 2018-01-23 DIAGNOSIS — T451X5A Adverse effect of antineoplastic and immunosuppressive drugs, initial encounter: Secondary | ICD-10-CM

## 2018-01-23 DIAGNOSIS — Z452 Encounter for adjustment and management of vascular access device: Secondary | ICD-10-CM | POA: Insufficient documentation

## 2018-01-23 DIAGNOSIS — E119 Type 2 diabetes mellitus without complications: Secondary | ICD-10-CM

## 2018-01-23 LAB — CBC WITH DIFFERENTIAL (CANCER CENTER ONLY)
BASOS ABS: 0 10*3/uL (ref 0.0–0.1)
BASOS PCT: 1 %
EOS ABS: 0.1 10*3/uL (ref 0.0–0.5)
EOS PCT: 2 %
HEMATOCRIT: 36.9 % (ref 34.8–46.6)
Hemoglobin: 12.3 g/dL (ref 11.6–15.9)
LYMPHS ABS: 2.1 10*3/uL (ref 0.9–3.3)
LYMPHS PCT: 39 %
MCH: 30.6 pg (ref 25.1–34.0)
MCHC: 33.4 g/dL (ref 31.5–36.0)
MCV: 91.6 fL (ref 79.5–101.0)
Monocytes Absolute: 0.6 10*3/uL (ref 0.1–0.9)
Monocytes Relative: 11 %
Neutro Abs: 2.5 10*3/uL (ref 1.5–6.5)
Neutrophils Relative %: 47 %
Platelet Count: 146 10*3/uL (ref 145–400)
RBC: 4.03 MIL/uL (ref 3.70–5.45)
RDW: 14.7 % — ABNORMAL HIGH (ref 11.2–14.5)
WBC Count: 5.4 10*3/uL (ref 3.9–10.3)

## 2018-01-23 LAB — CMP (CANCER CENTER ONLY)
ALT: 15 U/L (ref 0–44)
AST: 20 U/L (ref 15–41)
Albumin: 3.7 g/dL (ref 3.5–5.0)
Alkaline Phosphatase: 61 U/L (ref 38–126)
Anion gap: 10 (ref 5–15)
BUN: 14 mg/dL (ref 8–23)
CHLORIDE: 104 mmol/L (ref 98–111)
CO2: 28 mmol/L (ref 22–32)
CREATININE: 0.81 mg/dL (ref 0.44–1.00)
Calcium: 9.8 mg/dL (ref 8.9–10.3)
GFR, Est AFR Am: >60 mL/min (ref >60)
GFR, Estimated: >60 mL/min (ref >60)
Glucose, Bld: 108 mg/dL — ABNORMAL HIGH (ref 70–99)
Potassium: 4.2 mmol/L (ref 3.5–5.1)
SODIUM: 142 mmol/L (ref 135–145)
Total Bilirubin: 0.3 mg/dL (ref 0.3–1.2)
Total Protein: 7.4 g/dL (ref 6.5–8.1)

## 2018-01-23 MED ORDER — LEUCOVORIN CALCIUM INJECTION 350 MG
400.0000 mg/m2 | Freq: Once | INTRAVENOUS | Status: AC
  Administered 2018-01-23: 700 mg via INTRAVENOUS
  Filled 2018-01-23: qty 35

## 2018-01-23 MED ORDER — DEXTROSE 5 % IV SOLN
Freq: Once | INTRAVENOUS | Status: AC
  Administered 2018-01-23: 10:00:00 via INTRAVENOUS
  Filled 2018-01-23: qty 250

## 2018-01-23 MED ORDER — PALONOSETRON HCL INJECTION 0.25 MG/5ML
0.2500 mg | Freq: Once | INTRAVENOUS | Status: AC
  Administered 2018-01-23: 0.25 mg via INTRAVENOUS

## 2018-01-23 MED ORDER — FLUOROURACIL CHEMO INJECTION 500 MG/10ML
200.0000 mg/m2 | Freq: Once | INTRAVENOUS | Status: AC
  Administered 2018-01-23: 350 mg via INTRAVENOUS
  Filled 2018-01-23: qty 7

## 2018-01-23 MED ORDER — DEXAMETHASONE SODIUM PHOSPHATE 10 MG/ML IJ SOLN
10.0000 mg | Freq: Once | INTRAMUSCULAR | Status: AC
  Administered 2018-01-23: 10 mg via INTRAVENOUS

## 2018-01-23 MED ORDER — SODIUM CHLORIDE 0.9% FLUSH
10.0000 mL | Freq: Once | INTRAVENOUS | Status: AC
  Administered 2018-01-23: 10 mL
  Filled 2018-01-23: qty 10

## 2018-01-23 MED ORDER — OXALIPLATIN CHEMO INJECTION 100 MG/20ML
68.0000 mg/m2 | Freq: Once | INTRAVENOUS | Status: AC
  Administered 2018-01-23: 120 mg via INTRAVENOUS
  Filled 2018-01-23: qty 4

## 2018-01-23 MED ORDER — PALONOSETRON HCL INJECTION 0.25 MG/5ML
INTRAVENOUS | Status: AC
  Filled 2018-01-23: qty 5

## 2018-01-23 MED ORDER — DEXAMETHASONE SODIUM PHOSPHATE 10 MG/ML IJ SOLN
INTRAMUSCULAR | Status: AC
  Filled 2018-01-23: qty 1

## 2018-01-23 MED ORDER — SODIUM CHLORIDE 0.9 % IV SOLN
2400.0000 mg/m2 | INTRAVENOUS | Status: DC
  Administered 2018-01-23: 4200 mg via INTRAVENOUS
  Filled 2018-01-23: qty 84

## 2018-01-23 NOTE — Patient Instructions (Signed)
[facility] Discharge Instructions for Patients Receiving Chemotherapy  Today you received the following chemotherapy agents Oxaliplatin, Leucovorin, Adrucil  To help prevent nausea and vomiting after your treatment, we encourage you to take your nausea medication as directed   If you develop nausea and vomiting that is not controlled by your nausea medication, call the clinic.   BELOW ARE SYMPTOMS THAT SHOULD BE REPORTED IMMEDIATELY:  *FEVER GREATER THAN 100.5 F  *CHILLS WITH OR WITHOUT FEVER  NAUSEA AND VOMITING THAT IS NOT CONTROLLED WITH YOUR NAUSEA MEDICATION  *UNUSUAL SHORTNESS OF BREATH  *UNUSUAL BRUISING OR BLEEDING  TENDERNESS IN MOUTH AND THROAT WITH OR WITHOUT PRESENCE OF ULCERS  *URINARY PROBLEMS  *BOWEL PROBLEMS  UNUSUAL RASH Items with * indicate a potential emergency and should be followed up as soon as possible.  Feel free to call the clinic should you have any questions or concerns. The clinic phone number is [phone].  Please show the CHEMO ALERT CARD at check-in to the Emergency Department and triage nurse.   

## 2018-01-23 NOTE — Assessment & Plan Note (Signed)
So far, she tolerated treatment very well She had virtually no side effects except for cold sensitivity Her recent tumor marker has normalized She is scheduled for CT imaging in 2 weeks We will continue treatment without dose adjustment Per patient request, I have given her a prescription for cranial prosthesis

## 2018-01-23 NOTE — Assessment & Plan Note (Signed)
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

## 2018-01-23 NOTE — Progress Notes (Signed)
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[place]) So far, she tolerated treatment very well She had virtually no side effects except for cold sensitivity Her recent tumor marker has normalized She is scheduled for CT imaging in 2 weeks We will continue treatment without dose adjustment Per patient request, I have given her a prescription for cranial prosthesis  Peripheral neuropathy due to chemotherapy [facility]) She has minor cold sensitivity but not significant neuropathy retreatment We will continue treatment as schedule without dose adjustment  Type 2 diabetes mellitus treated without insulin ([place]) Her blood sugar has been stable at home.  Continue metformin and dietary modification   No orders of the defined types were placed in this encounter.   INTERVAL HISTORY: Please see below for problem oriented charting. 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 The left adnexa measured 12.7 x 11.6 x 8.1 with low level echoes and soft tissue nodules     07/30/2017 Tumor Marker    Patient's tumor was tested for the following markers: CA-125 Results of the tumor marker test revealed 521.3    08/17/2017 Pathology Results    The malignant  cells are positive for PAX-8, cytokeratin 7, estrogen receptor, and faintly positive for GATA-3. They are negative for p53, GCDFP, and cytokeratin 20. The finding are consistent with a gynecologic primary carcinoma. Additional studies can be performed upon clinician request.    08/17/2017 Procedure    Technically successful CT-guided left lower quadrant omental mass core biopsy.    08/20/2017 PET scan    1. Cystic masses arising from the pelvis. The nodular component of the RIGHT cystic mass is intensely hypermetabolic consistent with malignant ovarian neoplasm. 2. Extensive hypermetabolic peritoneal thickening in the lower abdomen and upper pelvis, upper abdomen, and upper abdominal precordial fat and paradiaphragmatic fat. 3. Retroperitoneal nodal metastasis adjacent to the IVC at the level the kidneys. 4. No evidence of metastatic disease in the thorax other small effusion on the LEFT and nodal metastasis in the fat superior to the diaphragm. 5. Mild metabolic activity associated the distal esophagus is favored benign esophagitis.    08/20/2017 Imaging    CT chest 1. Bilateral cardiophrenic angle nodal metastasis. No additional findings to suggest metastatic disease to the chest. 2. Small left pleural effusion. 3. Peritoneal carcinomatosis noted within the abdomen. 4. Hepatic steatosis.     08/24/2017 Cancer Staging    Staging form: Ovary, Fallopian Tube, and Primary Peritoneal Carcinoma, AJCC 8th Edition - Clinical: Stage IV (cT3, cN1, cM1) - Signed by [name], MD on 08/24/2017    08/31/2017 Tumor Marker    Patient's tumor was tested for the following markers: CA-125 Results of the tumor marker test revealed 819.9    09/26/2017 Adverse Reaction    She developed reaction  to Taxol, managed successfully with additional premedications    10/17/2017 Tumor Marker    Patient's tumor was tested for the following markers: CA-125 Results of the tumor marker test revealed 374.4    10/31/2017  Imaging    1. No significant change in size of the large complex bilateral adnexal masses consistent with the known history of ovarian cancer. There is increased dependent density within the left-sided lesion. 2. Stable peritoneal carcinomatosis.  No significant ascites.  3. The bilateral pericardiac adenopathy has improved. No progressive thoracic metastatic disease. No pulmonary or osseous metastatic disease. 4. Suspicion of nonocclusive thrombus in the right internal jugular vein related to the right IJ port. Recommend further evaluation with Doppler ultrasound.    11/13/2017 Surgery    Preoperative Diagnosis: Ovarian cancer s/p neoadjuvant chemotherapy   Procedure(s) Performed:   1. Exploratory laparotomy  2. Bilateral salpingo-oophorectomy with radical tumor debulking for ovarian cancer   including retroperitoneal dissection, lysis of adhesions (enterolysis of small bowel in deep pelvis, left adnexal adhesions to sigmoid and omentum to LLQ) ~1 hour, ureterolysis. 3. Omentectomy  4. Takedown of splenic flexure with removal of omental tumor in left upper quadrant  Specimens: Bilateral tubes and ovaries, omentum, splenic flexure nodule, right paracolic gutter nodule, cecal nodule, right ureteral peritoneal nodule, small bowel mesentary.  Indication for Procedure:  Patient is s/p 3 cycles of chemotherapy with response based on CA125 and decreased mediastinal disease (to <1cm).  Operative Findings:  This represented an optimal cytoreduction with gross residual disease remaining in the deep pelvis near the levator floor and possibly in the region of the right IP ligament/periappendicial region.  Upon entry a large ~20cm right tube/ovary, mostly cystic. Adherent rind along ileocecal region and appendix. Large cystic left tube/ovary ~10cm with sigmoid colon stretched across the mass and extension of the cystic lesion into the deep pelvis with residual palpable disease deep pelvis near  levators. Estimate ~1cm or less on palpation, not visible disease. Omental caking in the LLQ adherent to pelvic sidewall. Omental disease noted RUQ separate. In addition ~1.5cm lesion in splenic flexure omentum. Evidence of prior diaphragmatic disease, no visible disease.       11/13/2017 Pathology Results    1. 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 - TUMOR INVOLVES THE OVARIAN SURFACE. - RIGHT FALLOPIAN TUBE IS INVOLVED. - SEE ONCOLOGY TABLE. - SEE NOTE. 2. Soft tissue mass, biopsy, cecal gutter nodule - METASTATIC MUCINOUS ADENOCARCINOMA. 3. Ovary and fallopian tube, left, left ovary and fallopian tube - METASTATIC MUCINOUS ADENOCARCINOMA TO LEFT OVARY AND FALLOPIAN TUBE. 4. Omentum, resection for tumor - METASTATIC MUCINOUS ADENOCARCINOMA TO OMENTUM. 5. Soft tissue mass, biopsy, splenic flexure nodule - METASTATIC MUCINOUS ADENOCARCINOMA. 6. Soft tissue mass, biopsy, cecal implant - METASTATIC MUCINOUS ADENOCARCINOMA. 7. Soft tissue mass, biopsy, right ureteral peritoneal implant - METASTATIC MUCINOUS ADENOCARCINOMA. 8. Mesentery, small bowel nodule - METASTATIC MUCINOUS ADENOCARCINOMA. Microscopic Comment 1. OVARY or FALLOPIAN TUBE or PRIMARY PERITONEUM: Procedure: Bilateral salpingo-oophorectomy. Specimen Integrity: Intact. Tumor Site: Right ovary. Ovarian Surface Involvement: Present. Fallopian Tube Surface Involvement: Present. Tumor Size: 20 cm. Histologic Type: Mucinous adenocarcinoma. Histologic Grade: Overall G2 (moderately differentiated) (focal areas of poor differentiation are present). Implants: Present. Other Tissue/ Organ Involvement: Left ovary, left fallopian tube and omentum. Largest Extrapelvic Peritoneal Focus: less than 2 cm. See note Peritoneal/Ascitic Fluid: N/A Treatment Effect: No definite or minimal response identified (chemotherapy response score 1 [CRS 1]) Regional Lymph Nodes No lymph  nodes  submitted or found Number of Lymph Nodes Examined: 0 Pathologic Stage Classification (pTNM, AJCC 8th Edition): ypT3b, ypNX. Representative Tumor Block: 1D and 1E. (NDK:gt, 11/15/17)    11/19/2017 Genetic Testing    Negative genetic testing on the [employer] Myrisk panel.  The [facility] gene panel offered by [employer] includes sequencing and deletion/duplication testing of the following 35 genes: APC, ATM, AXIN2, BARD1, BMPR1A, BRCA1, BRCA2, BRIP1, CHD1, CDK4, CDKN2A, CHEK2, EPCAM (large rearrangement only), HOXB13, (sequencing only), GALNT12, MLH1, MSH2, MSH3 (excluding repetitive portions of exon 1), MSH6, MUTYH, NBN, NTHL1, PALB2, PMS2, PTEN, RAD51C, RAD51D, RNF43, RPS20, SMAD4, STK11, and TP53. Sequencing was performed for select regions of POLE and POLD1, and large rearrangement analysis was performed for select regions of GREM1. The report date is November 19, 2017.  HRD tumor results indicate a BRCA1 mutation identified in the ovarian tumor causing genomic instability. The report date is 12/04/2017      Genetic Testing    Patient has genetic testing done for ER/PR and Her2/neu. Results revealed patient has the following: ER 15-20% PR 5-10% Her2/neu - negative     Genetic Testing    Patient has genetic testing done for MSI/MMR. Results revealed patient has the following mutation(s): MMR normal, MSI stable    12/10/2017 Imaging    1. Interval resection of large bilateral adnexal cystic masses. Residual soft tissue/tumor within bilateral adnexal regions as above. 2. Interval resolution of previously noted omental cake which may reflect interval omentectomy. There is a new peritoneal deposit identified within the left upper quadrant of the abdomen involving the anterior surface of the spleen.    12/10/2017 Tumor Marker    Patient's tumor was tested for the following markers: CA-125 Results of the tumor marker test revealed 94.9    12/11/2017 -  Chemotherapy    The patient  had FOLFOX regimen for mucinous    01/09/2018 Tumor Marker    Patient's tumor was tested for the following markers: CA-125 Results of the tumor marker test revealed 37     REVIEW OF SYSTEMS:   Constitutional: Denies fevers, chills or abnormal weight loss Eyes: Denies blurriness of vision Ears, nose, mouth, throat, and face: Denies mucositis or sore throat Respiratory: Denies cough, dyspnea or wheezes Cardiovascular: Denies palpitation, chest discomfort or lower extremity swelling Gastrointestinal:  Denies nausea, heartburn or change in bowel habits Skin: Denies abnormal skin rashes Lymphatics: Denies new lymphadenopathy or easy bruising Behavioral/Psych: Mood is stable, no new changes  All other systems were reviewed with the patient and are negative.  I have reviewed the past medical history, past surgical history, social history and family history with the patient and they are unchanged from previous note.  ALLERGIES:  has No Known Allergies.  MEDICATIONS:  Current Outpatient Medications  Medication Sig Dispense Refill  . acetaminophen (TYLENOL) 500 MG tablet Take 500 mg by mouth every 8 (eight) hours as needed for mild pain or moderate pain.    Marland Kitchen atenolol (TENORMIN) 25 MG tablet Take 25 mg by mouth at bedtime.     Marland Kitchen CALCIUM-MAGNESIUM-VITAMIN D PO Take 1 tablet by mouth daily.    . famotidine (PEPCID) 20 MG tablet Take 20 mg by mouth as needed for heartburn or indigestion.    . lidocaine-prilocaine (EMLA) cream Apply 1 application topically as needed. 30 g 6  . loratadine (CLARITIN) 10 MG tablet Take 10 mg by mouth daily as needed for allergies.    . metFORMIN (GLUCOPHAGE) 1000 MG tablet Take 500 mg by mouth  2 (two) times daily.   0  . Multiple Vitamin (MULTIVITAMIN WITH MINERALS) TABS tablet Take 1 tablet by mouth daily.    . ondansetron (ZOFRAN) 8 MG tablet Take 1 tablet (8 mg total) by mouth every 8 (eight) hours as needed for nausea. 30 tablet 3  . prochlorperazine  (COMPAZINE) 10 MG tablet Take 1 tablet (10 mg total) by mouth every 6 (six) hours as needed for nausea or vomiting. 30 tablet 0   No current facility-administered medications for this visit.     PHYSICAL EXAMINATION: ECOG PERFORMANCE STATUS: 1 - Symptomatic but completely ambulatory  Vitals:   01/23/18 0922  BP: 138/71  Pulse: 64  Resp: 18  Temp: 98 F (36.7 C)  SpO2: 100%   Filed Weights   01/23/18 0922  Weight: 137 lb 12.8 oz (62.5 kg)    GENERAL:alert, no distress and comfortable SKIN: skin color, texture, turgor are normal, no rashes or significant lesions EYES: normal, Conjunctiva are pink and non-injected, sclera clear OROPHARYNX:no exudate, no erythema and lips, buccal mucosa, and tongue normal  NECK: supple, thyroid normal size, non-tender, without nodularity LYMPH:  no palpable lymphadenopathy in the cervical, axillary or inguinal LUNGS: clear to auscultation and percussion with normal breathing effort HEART: regular rate & rhythm and no murmurs and no lower extremity edema ABDOMEN:abdomen soft, non-tender and normal bowel sounds Musculoskeletal:no cyanosis of digits and no clubbing  NEURO: alert & oriented x 3 with fluent speech, no focal motor/sensory deficits  LABORATORY DATA:  I have reviewed the data as listed    Component Value Date/Time   NA 142 01/09/2018 0840   K 4.0 01/09/2018 0840   CL 107 01/09/2018 0840   CO2 25 01/09/2018 0840   GLUCOSE 157 (H) 01/09/2018 0840   BUN 17 01/09/2018 0840   CREATININE 0.83 01/09/2018 0840   CREATININE 0.86 06/05/2014 0957   CALCIUM 9.2 01/09/2018 0840   PROT 7.2 01/09/2018 0840   ALBUMIN 3.6 01/09/2018 0840   AST 18 01/09/2018 0840   ALT 13 01/09/2018 0840   ALKPHOS 59 01/09/2018 0840   BILITOT 0.2 (L) 01/09/2018 0840   GFRNONAA >60 01/09/2018 0840   GFRAA >60 01/09/2018 0840    No results found for: SPEP, UPEP  Lab Results  Component Value Date   WBC 5.4 01/23/2018   NEUTROABS 2.5 01/23/2018   HGB  12.3 01/23/2018   HCT 36.9 01/23/2018   MCV 91.6 01/23/2018   PLT 146 01/23/2018      Chemistry      Component Value Date/Time   NA 142 01/09/2018 0840   K 4.0 01/09/2018 0840   CL 107 01/09/2018 0840   CO2 25 01/09/2018 0840   BUN 17 01/09/2018 0840   CREATININE 0.83 01/09/2018 0840   CREATININE 0.86 06/05/2014 0957      Component Value Date/Time   CALCIUM 9.2 01/09/2018 0840   ALKPHOS 59 01/09/2018 0840   AST 18 01/09/2018 0840   ALT 13 01/09/2018 0840   BILITOT 0.2 (L) 01/09/2018 0840      All questions were answered. The patient knows to call the clinic with any problems, questions or concerns. No barriers to learning was detected.  I spent 15 minutes counseling the patient face to face. The total time spent in the appointment was 20 minutes and more than 50% was on counseling and review of test results  [name], MD 01/23/2018 9:41 AM

## 2018-01-23 NOTE — Assessment & Plan Note (Signed)
Her blood sugar has been stable at home.  Continue metformin and dietary modification

## 2018-01-25 ENCOUNTER — Inpatient Hospital Stay: Payer: Medicare HMO

## 2018-01-25 VITALS — BP 128/72 | HR 68 | Temp 98.0°F | Resp 17

## 2018-01-25 DIAGNOSIS — D696 Thrombocytopenia, unspecified: Secondary | ICD-10-CM | POA: Diagnosis not present

## 2018-01-25 DIAGNOSIS — C561 Malignant neoplasm of right ovary: Secondary | ICD-10-CM | POA: Diagnosis not present

## 2018-01-25 DIAGNOSIS — Z452 Encounter for adjustment and management of vascular access device: Secondary | ICD-10-CM | POA: Diagnosis not present

## 2018-01-25 DIAGNOSIS — Z5111 Encounter for antineoplastic chemotherapy: Secondary | ICD-10-CM | POA: Diagnosis not present

## 2018-01-25 DIAGNOSIS — R590 Localized enlarged lymph nodes: Secondary | ICD-10-CM

## 2018-01-25 DIAGNOSIS — G620 Drug-induced polyneuropathy: Secondary | ICD-10-CM | POA: Diagnosis not present

## 2018-01-25 DIAGNOSIS — E119 Type 2 diabetes mellitus without complications: Secondary | ICD-10-CM | POA: Diagnosis not present

## 2018-01-25 MED ORDER — SODIUM CHLORIDE 0.9% FLUSH
10.0000 mL | INTRAVENOUS | Status: DC | PRN
  Administered 2018-01-25: 10 mL
  Filled 2018-01-25: qty 10

## 2018-01-25 MED ORDER — HEPARIN SOD (PORK) LOCK FLUSH 100 UNIT/ML IV SOLN
500.0000 [IU] | Freq: Once | INTRAVENOUS | Status: AC | PRN
  Administered 2018-01-25: 500 [IU]
  Filled 2018-01-25: qty 5

## 2018-01-29 ENCOUNTER — Telehealth: Payer: Self-pay | Admitting: Hematology and Oncology

## 2018-01-29 NOTE — Telephone Encounter (Signed)
Faxed medical records to [facility] @ [phone].  Release ID #[id number]

## 2018-01-31 ENCOUNTER — Ambulatory Visit (HOSPITAL_COMMUNITY)
Admission: RE | Admit: 2018-01-31 | Discharge: 2018-01-31 | Disposition: A | Discharge status: Home / Self Care | Payer: Medicare HMO | Source: Ambulatory Visit | Attending: Hematology and Oncology | Admitting: Hematology and Oncology

## 2018-01-31 DIAGNOSIS — K760 Fatty (change of) liver, not elsewhere classified: Secondary | ICD-10-CM | POA: Insufficient documentation

## 2018-01-31 DIAGNOSIS — C561 Malignant neoplasm of right ovary: Secondary | ICD-10-CM | POA: Insufficient documentation

## 2018-01-31 DIAGNOSIS — K5730 Diverticulosis of large intestine without perforation or abscess without bleeding: Secondary | ICD-10-CM | POA: Diagnosis not present

## 2018-01-31 MED ORDER — IOPAMIDOL (ISOVUE-300) INJECTION 61%
INTRAVENOUS | Status: AC
  Filled 2018-01-31: qty 30

## 2018-01-31 MED ORDER — SODIUM CHLORIDE 0.9 % IJ SOLN
INTRAMUSCULAR | Status: AC
  Filled 2018-01-31: qty 50

## 2018-01-31 MED ORDER — IOHEXOL 300 MG/ML  SOLN
100.0000 mL | Freq: Once | INTRAMUSCULAR | Status: AC | PRN
  Administered 2018-01-31: 100 mL via INTRAVENOUS

## 2018-02-01 DIAGNOSIS — L658 Other specified nonscarring hair loss: Secondary | ICD-10-CM | POA: Diagnosis not present

## 2018-02-01 DIAGNOSIS — C569 Malignant neoplasm of unspecified ovary: Secondary | ICD-10-CM | POA: Diagnosis not present

## 2018-02-06 ENCOUNTER — Telehealth: Payer: Self-pay | Admitting: Hematology and Oncology

## 2018-02-06 ENCOUNTER — Inpatient Hospital Stay: Payer: Medicare HMO

## 2018-02-06 ENCOUNTER — Encounter: Payer: Self-pay | Admitting: Hematology and Oncology

## 2018-02-06 ENCOUNTER — Inpatient Hospital Stay (HOSPITAL_BASED_OUTPATIENT_CLINIC_OR_DEPARTMENT_OTHER): Payer: Medicare HMO | Admitting: Hematology and Oncology

## 2018-02-06 DIAGNOSIS — C561 Malignant neoplasm of right ovary: Secondary | ICD-10-CM

## 2018-02-06 DIAGNOSIS — G620 Drug-induced polyneuropathy: Secondary | ICD-10-CM | POA: Diagnosis not present

## 2018-02-06 DIAGNOSIS — D696 Thrombocytopenia, unspecified: Secondary | ICD-10-CM | POA: Diagnosis not present

## 2018-02-06 DIAGNOSIS — R590 Localized enlarged lymph nodes: Secondary | ICD-10-CM

## 2018-02-06 DIAGNOSIS — E119 Type 2 diabetes mellitus without complications: Secondary | ICD-10-CM | POA: Diagnosis not present

## 2018-02-06 DIAGNOSIS — Z452 Encounter for adjustment and management of vascular access device: Secondary | ICD-10-CM | POA: Diagnosis not present

## 2018-02-06 DIAGNOSIS — Z5111 Encounter for antineoplastic chemotherapy: Secondary | ICD-10-CM | POA: Diagnosis not present

## 2018-02-06 LAB — CBC WITH DIFFERENTIAL (CANCER CENTER ONLY)
ABS IMMATURE GRANULOCYTES: 0.01 10*3/uL (ref 0.00–0.07)
BASOS PCT: 1 %
Basophils Absolute: 0 10*3/uL (ref 0.0–0.1)
Eosinophils Absolute: 0.2 10*3/uL (ref 0.0–0.5)
Eosinophils Relative: 3 %
HEMATOCRIT: 37.4 % (ref 36.0–46.0)
Hemoglobin: 12.1 g/dL (ref 12.0–15.0)
IMMATURE GRANULOCYTES: 0 %
Lymphocytes Relative: 38 %
Lymphs Abs: 1.8 10*3/uL (ref 0.7–4.0)
MCH: 30.3 pg (ref 26.0–34.0)
MCHC: 32.4 g/dL (ref 30.0–36.0)
MCV: 93.7 fL (ref 80.0–100.0)
MONO ABS: 0.4 10*3/uL (ref 0.1–1.0)
MONOS PCT: 9 %
NEUTROS ABS: 2.3 10*3/uL (ref 1.7–7.7)
Neutrophils Relative %: 49 %
PLATELETS: 129 10*3/uL — AB (ref 150–400)
RBC: 3.99 MIL/uL (ref 3.87–5.11)
RDW: 14.1 % (ref 11.5–15.5)
WBC Count: 4.8 10*3/uL (ref 4.0–10.5)
nRBC: 0 % (ref 0.0–0.2)

## 2018-02-06 LAB — CMP (CANCER CENTER ONLY)
ALK PHOS: 64 U/L (ref 38–126)
ALT: 14 U/L (ref 0–44)
AST: 20 U/L (ref 15–41)
Albumin: 3.5 g/dL (ref 3.5–5.0)
Anion gap: 11 (ref 5–15)
BILIRUBIN TOTAL: 0.2 mg/dL — AB (ref 0.3–1.2)
BUN: 14 mg/dL (ref 8–23)
CALCIUM: 9.4 mg/dL (ref 8.9–10.3)
CO2: 25 mmol/L (ref 22–32)
CREATININE: 0.87 mg/dL (ref 0.44–1.00)
Chloride: 106 mmol/L (ref 98–111)
Glucose, Bld: 170 mg/dL — ABNORMAL HIGH (ref 70–99)
Potassium: 3.9 mmol/L (ref 3.5–5.1)
Sodium: 142 mmol/L (ref 135–145)
TOTAL PROTEIN: 7.1 g/dL (ref 6.5–8.1)

## 2018-02-06 MED ORDER — SODIUM CHLORIDE 0.9% FLUSH
10.0000 mL | Freq: Once | INTRAVENOUS | Status: AC
  Administered 2018-02-06: 10 mL
  Filled 2018-02-06: qty 10

## 2018-02-06 MED ORDER — PALONOSETRON HCL INJECTION 0.25 MG/5ML
INTRAVENOUS | Status: AC
  Filled 2018-02-06: qty 5

## 2018-02-06 MED ORDER — LEUCOVORIN CALCIUM INJECTION 350 MG
400.0000 mg/m2 | Freq: Once | INTRAVENOUS | Status: AC
  Administered 2018-02-06: 700 mg via INTRAVENOUS
  Filled 2018-02-06: qty 35

## 2018-02-06 MED ORDER — DEXAMETHASONE SODIUM PHOSPHATE 10 MG/ML IJ SOLN
10.0000 mg | Freq: Once | INTRAMUSCULAR | Status: AC
  Administered 2018-02-06: 10 mg via INTRAVENOUS

## 2018-02-06 MED ORDER — DEXAMETHASONE SODIUM PHOSPHATE 10 MG/ML IJ SOLN
INTRAMUSCULAR | Status: AC
  Filled 2018-02-06: qty 1

## 2018-02-06 MED ORDER — SODIUM CHLORIDE 0.9 % IV SOLN
2400.0000 mg/m2 | INTRAVENOUS | Status: DC
  Administered 2018-02-06: 4200 mg via INTRAVENOUS
  Filled 2018-02-06: qty 84

## 2018-02-06 MED ORDER — OLAPARIB 150 MG PO TABS: 300.0000 mg | ORAL_TABLET | Freq: Two times a day (BID) | ORAL | 11 refills | Status: DC

## 2018-02-06 MED ORDER — FLUOROURACIL CHEMO INJECTION 500 MG/10ML
200.0000 mg/m2 | Freq: Once | INTRAVENOUS | Status: AC
  Administered 2018-02-06: 350 mg via INTRAVENOUS
  Filled 2018-02-06: qty 7

## 2018-02-06 MED ORDER — OXALIPLATIN CHEMO INJECTION 100 MG/20ML
68.0000 mg/m2 | Freq: Once | INTRAVENOUS | Status: AC
  Administered 2018-02-06: 120 mg via INTRAVENOUS
  Filled 2018-02-06: qty 20

## 2018-02-06 MED ORDER — DEXTROSE 5 % IV SOLN
Freq: Once | INTRAVENOUS | Status: AC
  Administered 2018-02-06: 10:00:00 via INTRAVENOUS
  Filled 2018-02-06: qty 250

## 2018-02-06 MED ORDER — PALONOSETRON HCL INJECTION 0.25 MG/5ML
0.2500 mg | Freq: Once | INTRAVENOUS | Status: AC
  Administered 2018-02-06: 0.25 mg via INTRAVENOUS

## 2018-02-06 NOTE — Telephone Encounter (Signed)
Gave pt avs and calendar  °

## 2018-02-06 NOTE — Patient Instructions (Signed)
[facility] Discharge Instructions for Patients Receiving Chemotherapy  Today you received the following chemotherapy agents Oxaliplatin, Leucovorin and Adrucil   To help prevent nausea and vomiting after your treatment, we encourage you to take your nausea medication as directed.    If you develop nausea and vomiting that is not controlled by your nausea medication, call the clinic.   BELOW ARE SYMPTOMS THAT SHOULD BE REPORTED IMMEDIATELY:  *FEVER GREATER THAN 100.5 F  *CHILLS WITH OR WITHOUT FEVER  NAUSEA AND VOMITING THAT IS NOT CONTROLLED WITH YOUR NAUSEA MEDICATION  *UNUSUAL SHORTNESS OF BREATH  *UNUSUAL BRUISING OR BLEEDING  TENDERNESS IN MOUTH AND THROAT WITH OR WITHOUT PRESENCE OF ULCERS  *URINARY PROBLEMS  *BOWEL PROBLEMS  UNUSUAL RASH Items with * indicate a potential emergency and should be followed up as soon as possible.  Feel free to call the clinic should you have any questions or concerns. The clinic phone number is [phone].  Please show the CHEMO ALERT CARD at check-in to the Emergency Department and triage nurse.   

## 2018-02-06 NOTE — Assessment & Plan Note (Signed)
This is likely due to recent treatment. The patient denies recent history of bleeding such as epistaxis, hematuria or hematochezia. She is asymptomatic from the low platelet count. I will observe for now.  

## 2018-02-06 NOTE — Progress Notes (Signed)
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[place]) I have reviewed recent blood work and CT imaging with the patient She has complete response to therapy I recommend 2 more cycles of chemo this month After that, we will start her on PARP inhibitor, due to presence of BRCA mutation noted on her tumor cells We discussed the risk, benefits, side effects of all operative and she agreed to proceed We will give her some patient education handout She will start her treatment on March 06, 2018 I will get assistant from pharmacist for insurance prior authorization and approval After that, she will return here on a weekly basis for blood count monitoring I will see her back around the first week of December for further follow-up and toxicity review.  Thrombocytopenia ([place]) This is likely due to recent treatment. The patient denies recent history of bleeding such as epistaxis, hematuria or hematochezia. She is asymptomatic from the low platelet count. I will observe for now.    No orders of the defined types were placed in this encounter.   INTERVAL HISTORY: Please see below for problem oriented charting. 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 The left adnexa measured 12.7 x 11.6 x 8.1 with low  level echoes and soft tissue nodules     07/30/2017 Tumor Marker    Patient's tumor was tested for the following markers: CA-125 Results of the tumor marker test revealed 521.3    08/17/2017 Pathology Results    The malignant cells are positive for PAX-8, cytokeratin 7, estrogen receptor, and faintly positive for GATA-3. They are negative for p53, GCDFP, and cytokeratin 20. The finding are consistent with a gynecologic primary carcinoma. Additional studies can be performed upon clinician request.    08/17/2017 Procedure    Technically successful CT-guided left lower quadrant omental mass core biopsy.    08/20/2017 PET scan    1. Cystic masses arising from the pelvis. The nodular component of the RIGHT cystic mass is intensely hypermetabolic consistent with malignant ovarian neoplasm. 2. Extensive hypermetabolic peritoneal thickening in the lower abdomen and upper pelvis, upper abdomen, and upper abdominal precordial fat and paradiaphragmatic fat. 3. Retroperitoneal nodal metastasis adjacent to the IVC at the level the kidneys. 4. No evidence of metastatic disease in the thorax other small effusion on the LEFT and nodal metastasis in the fat superior to the diaphragm. 5. Mild metabolic activity associated the distal esophagus is favored benign esophagitis.    08/20/2017 Imaging    CT chest 1. Bilateral cardiophrenic angle nodal metastasis. No additional findings to suggest metastatic disease to the chest. 2. Small left pleural effusion. 3. Peritoneal carcinomatosis noted within the abdomen. 4. Hepatic steatosis.     08/24/2017 Cancer Staging    Staging form: Ovary, Fallopian Tube, and Primary Peritoneal Carcinoma, AJCC 8th Edition - Clinical: Stage  IV (cT3, cN1, cM1) - Signed by [name], MD on 08/24/2017    08/31/2017 Tumor Marker    Patient's tumor was tested for the following markers: CA-125 Results of the tumor marker test revealed 819.9    09/26/2017 Adverse Reaction    She developed  reaction to Taxol, managed successfully with additional premedications    10/17/2017 Tumor Marker    Patient's tumor was tested for the following markers: CA-125 Results of the tumor marker test revealed 374.4    10/31/2017 Imaging    1. No significant change in size of the large complex bilateral adnexal masses consistent with the known history of ovarian cancer. There is increased dependent density within the left-sided lesion. 2. Stable peritoneal carcinomatosis.  No significant ascites.  3. The bilateral pericardiac adenopathy has improved. No progressive thoracic metastatic disease. No pulmonary or osseous metastatic disease. 4. Suspicion of nonocclusive thrombus in the right internal jugular vein related to the right IJ port. Recommend further evaluation with Doppler ultrasound.    11/13/2017 Surgery    Preoperative Diagnosis: Ovarian cancer s/p neoadjuvant chemotherapy   Procedure(s) Performed:   1. Exploratory laparotomy  2. Bilateral salpingo-oophorectomy with radical tumor debulking for ovarian cancer   including retroperitoneal dissection, lysis of adhesions (enterolysis of small bowel in deep pelvis, left adnexal adhesions to sigmoid and omentum to LLQ) ~1 hour, ureterolysis. 3. Omentectomy  4. Takedown of splenic flexure with removal of omental tumor in left upper quadrant  Specimens: Bilateral tubes and ovaries, omentum, splenic flexure nodule, right paracolic gutter nodule, cecal nodule, right ureteral peritoneal nodule, small bowel mesentary.  Indication for Procedure:  Patient is s/p 3 cycles of chemotherapy with response based on CA125 and decreased mediastinal disease (to <1cm).  Operative Findings:  This represented an optimal cytoreduction with gross residual disease remaining in the deep pelvis near the levator floor and possibly in the region of the right IP ligament/periappendicial region.  Upon entry a large ~20cm right tube/ovary, mostly cystic. Adherent  rind along ileocecal region and appendix. Large cystic left tube/ovary ~10cm with sigmoid colon stretched across the mass and extension of the cystic lesion into the deep pelvis with residual palpable disease deep pelvis near levators. Estimate ~1cm or less on palpation, not visible disease. Omental caking in the LLQ adherent to pelvic sidewall. Omental disease noted RUQ separate. In addition ~1.5cm lesion in splenic flexure omentum. Evidence of prior diaphragmatic disease, no visible disease.       11/13/2017 Pathology Results    1. 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 - TUMOR INVOLVES THE OVARIAN SURFACE. - RIGHT FALLOPIAN TUBE IS INVOLVED. - SEE ONCOLOGY TABLE. - SEE NOTE. 2. Soft tissue mass, biopsy, cecal gutter nodule - METASTATIC MUCINOUS ADENOCARCINOMA. 3. Ovary and fallopian tube, left, left ovary and fallopian tube - METASTATIC MUCINOUS ADENOCARCINOMA TO LEFT OVARY AND FALLOPIAN TUBE. 4. Omentum, resection for tumor - METASTATIC MUCINOUS ADENOCARCINOMA TO OMENTUM. 5. Soft tissue mass, biopsy, splenic flexure nodule - METASTATIC MUCINOUS ADENOCARCINOMA. 6. Soft tissue mass, biopsy, cecal implant - METASTATIC MUCINOUS ADENOCARCINOMA. 7. Soft tissue mass, biopsy, right ureteral peritoneal implant - METASTATIC MUCINOUS ADENOCARCINOMA. 8. Mesentery, small bowel nodule - METASTATIC MUCINOUS ADENOCARCINOMA. Microscopic Comment 1. OVARY or FALLOPIAN TUBE or PRIMARY PERITONEUM: Procedure: Bilateral salpingo-oophorectomy. Specimen Integrity: Intact. Tumor Site: Right ovary. Ovarian Surface Involvement: Present. Fallopian Tube Surface Involvement: Present. Tumor Size: 20 cm. Histologic Type: Mucinous adenocarcinoma. Histologic Grade: Overall G2 (moderately differentiated) (focal areas of poor  differentiation are present). Implants: Present. Other Tissue/ Organ Involvement: Left ovary, left fallopian tube and  omentum. Largest Extrapelvic Peritoneal Focus: less than 2 cm. See note Peritoneal/Ascitic Fluid: N/A Treatment Effect: No definite or minimal response identified (chemotherapy response score 1 [CRS 1]) Regional Lymph Nodes No lymph nodes submitted or found Number of Lymph Nodes Examined: 0 Pathologic Stage Classification (pTNM, AJCC 8th Edition): ypT3b, ypNX. Representative Tumor Block: 1D and 1E. (NDK:gt, 11/15/17)    11/19/2017 Genetic Testing    Negative genetic testing on the [employer] Myrisk panel.  The [facility] gene panel offered by [employer] includes sequencing and deletion/duplication testing of the following 35 genes: APC, ATM, AXIN2, BARD1, BMPR1A, BRCA1, BRCA2, BRIP1, CHD1, CDK4, CDKN2A, CHEK2, EPCAM (large rearrangement only), HOXB13, (sequencing only), GALNT12, MLH1, MSH2, MSH3 (excluding repetitive portions of exon 1), MSH6, MUTYH, NBN, NTHL1, PALB2, PMS2, PTEN, RAD51C, RAD51D, RNF43, RPS20, SMAD4, STK11, and TP53. Sequencing was performed for select regions of POLE and POLD1, and large rearrangement analysis was performed for select regions of GREM1. The report date is November 19, 2017.  HRD tumor results indicate a BRCA1 mutation identified in the ovarian tumor causing genomic instability. The report date is 12/04/2017      Genetic Testing    Patient has genetic testing done for ER/PR and Her2/neu. Results revealed patient has the following: ER 15-20% PR 5-10% Her2/neu - negative     Genetic Testing    Patient has genetic testing done for MSI/MMR. Results revealed patient has the following mutation(s): MMR normal, MSI stable    12/10/2017 Imaging    1. Interval resection of large bilateral adnexal cystic masses. Residual soft tissue/tumor within bilateral adnexal regions as above. 2. Interval resolution of previously noted omental cake which may reflect interval omentectomy. There is a new peritoneal deposit identified within the left upper quadrant of the  abdomen involving the anterior surface of the spleen.    12/10/2017 Tumor Marker    Patient's tumor was tested for the following markers: CA-125 Results of the tumor marker test revealed 94.9    12/11/2017 -  Chemotherapy    The patient had FOLFOX regimen for mucinous    01/09/2018 Tumor Marker    Patient's tumor was tested for the following markers: CA-125 Results of the tumor marker test revealed 37    01/31/2018 Imaging    Resolution of peritoneal soft tissue density along the anterior margin of the spleen since prior study. No evidence of residual or progressive metastatic disease.  Mild sigmoid diverticulosis, without radiographic evidence of diverticulitis. Resolution of small pericolonic abscess along the left lateral pelvic sidewall.  Mild hepatic steatosis.     REVIEW OF SYSTEMS:   Constitutional: Denies fevers, chills or abnormal weight loss Eyes: Denies blurriness of vision Ears, nose, mouth, throat, and face: Denies mucositis or sore throat Respiratory: Denies cough, dyspnea or wheezes Cardiovascular: Denies palpitation, chest discomfort or lower extremity swelling Gastrointestinal:  Denies nausea, heartburn or change in bowel habits Skin: Denies abnormal skin rashes Lymphatics: Denies new lymphadenopathy or easy bruising Neurological:Denies numbness, tingling or new weaknesses Behavioral/Psych: Mood is stable, no new changes  All other systems were reviewed with the patient and are negative.  I have reviewed the past medical history, past surgical history, social history and family history with the patient and they are unchanged from previous note.  ALLERGIES:  has No Known Allergies.  MEDICATIONS:  Current Outpatient Medications  Medication Sig Dispense Refill  . acetaminophen (TYLENOL) 500 MG tablet Take 500  mg by mouth every 8 (eight) hours as needed for mild pain or moderate pain.    Marland Kitchen atenolol (TENORMIN) 25 MG tablet Take 25 mg by mouth at bedtime.     Marland Kitchen  CALCIUM-MAGNESIUM-VITAMIN D PO Take 1 tablet by mouth daily.    . famotidine (PEPCID) 20 MG tablet Take 20 mg by mouth as needed for heartburn or indigestion.    . lidocaine-prilocaine (EMLA) cream Apply 1 application topically as needed. 30 g 6  . loratadine (CLARITIN) 10 MG tablet Take 10 mg by mouth daily as needed for allergies.    . metFORMIN (GLUCOPHAGE) 1000 MG tablet Take 500 mg by mouth 2 (two) times daily.   0  . Multiple Vitamin (MULTIVITAMIN WITH MINERALS) TABS tablet Take 1 tablet by mouth daily.    Marland Kitchen olaparib ([name]) 150 MG tablet Take 2 tablets (300 mg total) by mouth 2 (two) times daily. Swallow whole. 120 tablet 11  . ondansetron (ZOFRAN) 8 MG tablet Take 1 tablet (8 mg total) by mouth every 8 (eight) hours as needed for nausea. 30 tablet 3  . prochlorperazine (COMPAZINE) 10 MG tablet Take 1 tablet (10 mg total) by mouth every 6 (six) hours as needed for nausea or vomiting. 30 tablet 0   No current facility-administered medications for this visit.    Facility-Administered Medications Ordered in Other Visits  Medication Dose Route Frequency Provider Last Rate Last Dose  . fluorouracil (ADRUCIL) 4,200 mg in sodium chloride 0.9 % 66 mL chemo infusion  2,400 mg/m2 (Treatment Plan Recorded) Intravenous 1 day or 1 dose Alvy Bimler, Daren Yeagle, MD   4,200 mg at 02/06/18 1301    PHYSICAL EXAMINATION: ECOG PERFORMANCE STATUS: 0 - Asymptomatic  Vitals:   02/06/18 0843  BP: (!) 149/70  Pulse: 77  Resp: 18  Temp: 97.9 F (36.6 C)  SpO2: 99%   Filed Weights   02/06/18 0843  Weight: 139 lb (63 kg)    GENERAL:alert, no distress and comfortable SKIN: skin color, texture, turgor are normal, no rashes or significant lesions EYES: normal, Conjunctiva are pink and non-injected, sclera clear OROPHARYNX:no exudate, no erythema and lips, buccal mucosa, and tongue normal  NECK: supple, thyroid normal size, non-tender, without nodularity LYMPH:  no palpable lymphadenopathy in the cervical,  axillary or inguinal LUNGS: clear to auscultation and percussion with normal breathing effort HEART: regular rate & rhythm and no murmurs and no lower extremity edema ABDOMEN:abdomen soft, non-tender and normal bowel sounds Musculoskeletal:no cyanosis of digits and no clubbing  NEURO: alert & oriented x 3 with fluent speech, no focal motor/sensory deficits  LABORATORY DATA:  I have reviewed the data as listed    Component Value Date/Time   NA 142 02/06/2018 0824   K 3.9 02/06/2018 0824   CL 106 02/06/2018 0824   CO2 25 02/06/2018 0824   GLUCOSE 170 (H) 02/06/2018 0824   BUN 14 02/06/2018 0824   CREATININE 0.87 02/06/2018 0824   CREATININE 0.86 06/05/2014 0957   CALCIUM 9.4 02/06/2018 0824   PROT 7.1 02/06/2018 0824   ALBUMIN 3.5 02/06/2018 0824   AST 20 02/06/2018 0824   ALT 14 02/06/2018 0824   ALKPHOS 64 02/06/2018 0824   BILITOT 0.2 (L) 02/06/2018 0824   GFRNONAA >60 02/06/2018 0824   GFRAA >60 02/06/2018 0824    No results found for: SPEP, UPEP  Lab Results  Component Value Date   WBC 4.8 02/06/2018   NEUTROABS 2.3 02/06/2018   HGB 12.1 02/06/2018   HCT 37.4 02/06/2018  MCV 93.7 02/06/2018   PLT 129 (L) 02/06/2018      Chemistry      Component Value Date/Time   NA 142 02/06/2018 0824   K 3.9 02/06/2018 0824   CL 106 02/06/2018 0824   CO2 25 02/06/2018 0824   BUN 14 02/06/2018 0824   CREATININE 0.87 02/06/2018 0824   CREATININE 0.86 06/05/2014 0957      Component Value Date/Time   CALCIUM 9.4 02/06/2018 0824   ALKPHOS 64 02/06/2018 0824   AST 20 02/06/2018 0824   ALT 14 02/06/2018 0824   BILITOT 0.2 (L) 02/06/2018 0824       RADIOGRAPHIC STUDIES: I have reviewed multiple imaging studies with the patient and her brother I have personally reviewed the radiological images as listed and agreed with the findings in the report. Ct Abdomen Pelvis W Contrast  Result Date: 01/31/2018 CLINICAL DATA:  Follow-up right ovarian epithelial carcinoma. Status  post surgery and chemotherapy. EXAM: CT ABDOMEN AND PELVIS WITH CONTRAST TECHNIQUE: Multidetector CT imaging of the abdomen and pelvis was performed using the standard protocol following bolus administration of intravenous contrast. CONTRAST:  193m OMNIPAQUE IOHEXOL 300 MG/ML  SOLN COMPARISON:  12/10/2017 FINDINGS: Lower Chest: No acute findings. Hepatobiliary: No hepatic masses identified. Mild hepatic steatosis. Prior cholecystectomy. No evidence of biliary obstruction. Pancreas:  No mass or inflammatory changes. Spleen: Within normal limits in size and appearance. Previously seen peritoneal soft tissue density along the anterior margin the spleen has resolved since previous study. Adrenals/Urinary Tract: No masses identified. Small right renal cysts again noted. No evidence of hydronephrosis. Normal appearance of renal collecting systems, ureters, and bladder. Stomach/Bowel: No evidence of obstruction, inflammatory process or abnormal fluid collections. Sigmoid diverticulosis. No radiographic evidence of diverticulitis. Previously seen small pericolonic abscess along the left lateral pelvic wall has resolved since prior study. Vascular/Lymphatic: No evidence of abdominal aortic aneurysm. Aortic atherosclerosis. No pathologically enlarged lymph nodes identified. Sub-cm lymph nodes again seen in both cardiophrenic angles which remain stable. Reproductive: Prior hysterectomy noted. Adnexal regions are unremarkable in appearance. Other:  None. Musculoskeletal:  No suspicious bone lesions identified. IMPRESSION: Resolution of peritoneal soft tissue density along the anterior margin of the spleen since prior study. No evidence of residual or progressive metastatic disease. Mild sigmoid diverticulosis, without radiographic evidence of diverticulitis. Resolution of small pericolonic abscess along the left lateral pelvic sidewall. Mild hepatic steatosis. Electronically Signed   By: JEarle GellM.D.   On: 01/31/2018 14:50     All questions were answered. The patient knows to call the clinic with any problems, questions or concerns. No barriers to learning was detected.  I spent 25 minutes counseling the patient face to face. The total time spent in the appointment was 30 minutes and more than 50% was on counseling and review of test results  NHeath Lark MD 02/06/2018 2:55 PM

## 2018-02-06 NOTE — Assessment & Plan Note (Signed)
I have reviewed recent blood work and CT imaging with the patient She has complete response to therapy I recommend 2 more cycles of chemo this month After that, we will start her on PARP inhibitor, due to presence of BRCA mutation noted on her tumor cells We discussed the risk, benefits, side effects of all operative and she agreed to proceed We will give her some patient education handout She will start her treatment on March 06, 2018 I will get assistant from pharmacist for insurance prior authorization and approval After that, she will return here on a weekly basis for blood count monitoring I will see her back around the first week of December for further follow-up and toxicity review.

## 2018-02-06 NOTE — Patient Instructions (Signed)
Olaparib oral capsules What is this medicine? OLAPARIB (oh LA pa rib) is a chemotherapy drug. It targets specific enzymes within cancer cells and stops the cancer cell from growing. This medicine is used to treat ovarian cancer. This medicine may be used for other purposes; ask your health care provider or pharmacist if you have questions. COMMON BRAND NAME(S): [name] What should I tell my health care provider before I take this medicine? They need to know if you have any of these conditions: -anemia -kidney disease -liver disease -lung disease -low blood counts, like low white cell, platelet, or red cell counts -an unusual or allergic reaction to olaparib, other medicines, foods, dyes, or preservatives -pregnant or trying to get pregnant -breast-feeding How should I use this medicine? Take this medicine by mouth with a glass of water. Follow the directions on the prescription label. Do not cut, crush, or chew this medicine. You can take it with or without food. If it upsets your stomach, take it with food. However, avoid grapefruit juice, grapefruit or [place] oranges while on this medicine. Take your medicine at regular intervals. Do not take it more often than directed. Do not stop taking except on your doctor's advice. A special MedGuide will be given to you by the pharmacist with each prescription and refill. Be sure to read this information carefully each time. Talk to your pediatrician regarding the use of this medicine in children. Special care may be needed. Overdosage: If you think you have taken too much of this medicine contact a poison control center or emergency room at once. NOTE: This medicine is only for you. Do not share this medicine with others. What if I miss a dose? If you miss a dose, take it as soon as you can. If it is almost time for your next dose, take only that dose. Do not take double or extra doses. What may interact with this medicine? -antiviral medicines  for hepatitis, HIV or AIDS -aprepitant -boceprevir -bosentan -carbamazepine -certain medicines for fungal infections like fluconazole, ketoconazole, itraconazole, posaconazole, and voriconazole -certain medicines for infections, such as ciprofloxacin, clarithromycin, erythromycin, telithromycin -crizotinib -diltiazem -grapefruit juice -imatinib -modafinil -nafcillin -nefazodone -phenobarbital -phenytoin -rifampin -[place] oranges -St. John's Wort -telaprevir -verapamil This list may not describe all possible interactions. Give your health care provider a list of all the medicines, herbs, non-prescription drugs, or dietary supplements you use. Also tell them if you smoke, drink alcohol, or use illegal drugs. Some items may interact with your medicine. What should I watch for while using this medicine? This drug may make you feel generally unwell. This is not uncommon, as chemotherapy can affect healthy cells as well as cancer cells. Report any side effects. Continue your course of treatment even though you feel ill unless your doctor tells you to stop. You will need blood work done while you are taking this medicine. This medicine may increase your risk to bruise or bleed. Call your doctor or health care professional if you notice any unusual bleeding. Call your doctor or health care professional for advice if you get a fever, chills or sore throat, or other symptoms of a cold or flu. Do not treat yourself. This drug decreases your body's ability to fight infections. Try to avoid being around people who are sick. If you are going to have surgery or any other procedures, tell your doctor you are taking this medicine. Do not become pregnant while taking this medicine or for 6 months after the last dose. Women should   inform their doctor if they wish to become pregnant or think they might be pregnant. Men should not father a child while taking this medicine and for 3 months after stopping it.  Do not donate sperm while taking this medicine and for 3 months after you stop taking this medicine. There is a potential for serious side effects to an unborn child. Talk to your health care professional or pharmacist for more information. Women who are able to become pregnant should use effective birth control during treatment and for at least 6 months after receiving the last dose. Talk to your healthcare provider about birth control methods that may be right for you. Do not breast-feed an infant while taking this medicine or for 1 month after the last dose. What side effects may I notice from receiving this medicine? Side effects that you should report to your doctor or health care professional as soon as possible: -allergic reactions like skin rash, itching or hives, swelling of the face, lips, or tongue -breathing problems, like shortness of breath, cough, or wheezing -fever -low blood counts - this medicine may decrease the number of white blood cells, red blood cells and platelets. You may be at increased risk for infections and bleeding. -signs and symptoms of bleeding such as bloody or black, tarry stools; red or dark-brown urine; spitting up blood or brown material that looks like coffee grounds; red spots on the skin; unusual bruising or bleeding from the eye, gums, or nose -signs and symptoms of a blood clot such as changes in vision; chest pain with breathing problems; severe, sudden headache; pain, swelling, warmth in the leg; trouble speaking; sudden numbness or weakness of the face, arm or leg -signs and symptoms of low magnesium like muscle cramps or muscle pain; tingling or tremors; muscle weakness; seizures; or fast, irregular heartbeat -signs and symptoms of infection like fever or chills; cough; sore throat; pain or trouble passing urine -weak or tired Side effects that usually do not require medical attention (report to your doctor or health care professional if they continue or  are bothersome): -changes in taste -diarrhea -headache -heartburn, indigestion -loss of appetite -muscle or joint pain -nausea/vomiting -runny nose -stomach pain -weight loss This list may not describe all possible side effects. Call your doctor for medical advice about side effects. You may report side effects to FDA at 1-800-FDA-1088. Where should I keep my medicine? Keep out of the reach of children. Store between 20 and 25 degrees C (68 and 77 degrees F). Do not store at temperatures greater than 40 degrees C (104 degrees F) and do not take this medicine if you think it may have been stored at a temperature greater than 104 degrees F. Throw away any unused medicine after the expiration date. NOTE: This sheet is a summary. It may not cover all possible information. If you have questions about this medicine, talk to your doctor, pharmacist, or health care provider.  2018 Elsevier/Gold Standard (2016-05-09 12:45:04)  

## 2018-02-07 LAB — CA 125: CANCER ANTIGEN (CA) 125: 36.7 U/mL (ref 0.0–38.1)

## 2018-02-08 ENCOUNTER — Inpatient Hospital Stay: Payer: Medicare HMO

## 2018-02-08 ENCOUNTER — Telehealth: Payer: Self-pay | Admitting: Pharmacist

## 2018-02-08 ENCOUNTER — Telehealth: Payer: Self-pay

## 2018-02-08 DIAGNOSIS — Z5111 Encounter for antineoplastic chemotherapy: Secondary | ICD-10-CM | POA: Diagnosis not present

## 2018-02-08 DIAGNOSIS — C561 Malignant neoplasm of right ovary: Secondary | ICD-10-CM

## 2018-02-08 DIAGNOSIS — D696 Thrombocytopenia, unspecified: Secondary | ICD-10-CM | POA: Diagnosis not present

## 2018-02-08 DIAGNOSIS — Z452 Encounter for adjustment and management of vascular access device: Secondary | ICD-10-CM | POA: Diagnosis not present

## 2018-02-08 DIAGNOSIS — R590 Localized enlarged lymph nodes: Secondary | ICD-10-CM

## 2018-02-08 DIAGNOSIS — E119 Type 2 diabetes mellitus without complications: Secondary | ICD-10-CM | POA: Diagnosis not present

## 2018-02-08 DIAGNOSIS — G620 Drug-induced polyneuropathy: Secondary | ICD-10-CM | POA: Diagnosis not present

## 2018-02-08 MED ORDER — HEPARIN SOD (PORK) LOCK FLUSH 100 UNIT/ML IV SOLN
500.0000 [IU] | Freq: Once | INTRAVENOUS | Status: AC | PRN
  Administered 2018-02-08: 500 [IU]
  Filled 2018-02-08: qty 5

## 2018-02-08 MED ORDER — SODIUM CHLORIDE 0.9% FLUSH
10.0000 mL | INTRAVENOUS | Status: DC | PRN
  Administered 2018-02-08: 10 mL
  Filled 2018-02-08: qty 10

## 2018-02-08 MED ORDER — OLAPARIB 100 MG PO TABS: 200.0000 mg | ORAL_TABLET | Freq: Two times a day (BID) | ORAL | 11 refills | Status: DC

## 2018-02-08 NOTE — Telephone Encounter (Signed)
Oral Oncology Pharmacist Encounter  Received new prescription for [name] (olaparib) for the treatment of stage IV ovarian carcinoma, with known BRCA mutation following treatment with platinum-based chemotherapy, planned duration until disease progression or unacceptable toxicity.  Patient treated with neoadjuvant carboplatin and paclitaxel on 5/15 and 6/5 and experienced reaction to paclitaxel on 6/5 Patient then treated with carboplatin and Abraxane on 6/26  Surgical resection performed 11/13/17  Patient received adjuvant treatment with oxaliplatin-based chemotherapy (FOLFOX) on 8/20, 8/4, 8/18, 10/2, and 10/16 Last planned infusional FOLFOX scheduled for 02/20/2018  Patient planned to start on maintenance [name] on 03/06/2018  Labs from 02/06/18 assessed, OK for treatment.  SCr=0.87, round to SCr=1.0 for age>65, est CrCl ~ 50 mL/min Dose reduction is recommended by manufacturer for CrCl 31-50 mL/min to 234m BID for increased tolerance. Increased incidence of ADEs has been noted in moderate renal dysfunction  Current medication list in Epic reviewed, [place]  [facility].  Oral Oncology Clinic will continue to follow for insurance authorization, copayment issues, initial counseling and start date.  JJohny Drilling PharmD, BCPS, BCOP  02/08/2018 10:06 AM Oral Oncology Clinic [phone]

## 2018-02-08 NOTE — Telephone Encounter (Signed)
Oral Oncology Patient Advocate Encounter  Received notification from [place] that prior authorization for [name] is required.  PA submitted on CoverMyMeds Key [id number] Status is pending  Oral Oncology Clinic will continue to follow.  [place] Patient [place] Phone [phone] Fax [phone]

## 2018-02-08 NOTE — Patient Instructions (Signed)
Implanted Port Home Guide An implanted port is a type of central line that is placed under the skin. Central lines are used to provide IV access when treatment or nutrition needs to be given through a person's veins. Implanted ports are used for long-term IV access. An implanted port may be placed because:  You need IV medicine that would be irritating to the small veins in your hands or arms.  You need long-term IV medicines, such as antibiotics.  You need IV nutrition for a long period.  You need frequent blood draws for lab tests.  You need dialysis.  Implanted ports are usually placed in the chest area, but they can also be placed in the upper arm, the abdomen, or the leg. An implanted port has two main parts:  Reservoir. The reservoir is round and will appear as a small, raised area under your skin. The reservoir is the part where a needle is inserted to give medicines or draw blood.  Catheter. The catheter is a thin, flexible tube that extends from the reservoir. The catheter is placed into a large vein. Medicine that is inserted into the reservoir goes into the catheter and then into the vein.  How will I care for my incision site? Do not get the incision site wet. Bathe or shower as directed by your health care provider. How is my port accessed? Special steps must be taken to access the port:  Before the port is accessed, a numbing cream can be placed on the skin. This helps numb the skin over the port site.  Your health care provider uses a sterile technique to access the port. ? Your health care provider must put on a mask and sterile gloves. ? The skin over your port is cleaned carefully with an antiseptic and allowed to dry. ? The port is gently pinched between sterile gloves, and a needle is inserted into the port.  Only "non-coring" port needles should be used to access the port. Once the port is accessed, a blood return should be checked. This helps ensure that the port  is in the vein and is not clogged.  If your port needs to remain accessed for a constant infusion, a clear (transparent) bandage will be placed over the needle site. The bandage and needle will need to be changed every week, or as directed by your health care provider.  Keep the bandage covering the needle clean and dry. Do not get it wet. Follow your health care provider's instructions on how to take a shower or bath while the port is accessed.  If your port does not need to stay accessed, no bandage is needed over the port.  What is flushing? Flushing helps keep the port from getting clogged. Follow your health care provider's instructions on how and when to flush the port. Ports are usually flushed with saline solution or a medicine called heparin. The need for flushing will depend on how the port is used.  If the port is used for intermittent medicines or blood draws, the port will need to be flushed: ? After medicines have been given. ? After blood has been drawn. ? As part of routine maintenance.  If a constant infusion is running, the port may not need to be flushed.  How long will my port stay implanted? The port can stay in for as long as your health care provider thinks it is needed. When it is time for the port to come out, surgery will be   done to remove it. The procedure is similar to the one performed when the port was put in. When should I seek immediate medical care? When you have an implanted port, you should seek immediate medical care if:  You notice a bad smell coming from the incision site.  You have swelling, redness, or drainage at the incision site.  You have more swelling or pain at the port site or the surrounding area.  You have a fever that is not controlled with medicine.  This information is not intended to replace advice given to you by your health care provider. Make sure you discuss any questions you have with your health care provider. Document  Released: 04/10/2005 Document Revised: 09/16/2015 Document Reviewed: 12/16/2012 Elsevier Interactive Patient Education  2017 [employer]  

## 2018-02-10 DIAGNOSIS — C561 Malignant neoplasm of right ovary: Secondary | ICD-10-CM | POA: Diagnosis not present

## 2018-02-11 NOTE — Telephone Encounter (Signed)
She will be getting weekly CBC blood count check while I am away Please make sure another MD review her results and call her if medication dose adjustment is needed

## 2018-02-11 NOTE — Telephone Encounter (Signed)
Oral Oncology Patient Advocate Encounter  Prior Authorization for [name] has been approved.    PA# [id number] Effective dates: 04/22/2017 through 04/23/2018  Oral Oncology Clinic will continue to follow.   [place] Patient [place] Phone [phone] Fax [phone]

## 2018-02-13 NOTE — Telephone Encounter (Signed)
Oral Chemotherapy Pharmacist Encounter   I spoke with patient today for overview of: [name] (olaparib) for the treatment of stage IV ovarian carcinoma, with known BRCA mutation following treatment with platinum-based chemotherapy, planned duration until disease progression or unacceptable toxicity.  Counseled patient on administration, dosing, side effects, monitoring, drug-food interactions, safe handling, storage, and disposal.  Patient will take [name] 124m tablets, 2 tablets (2047m by mouth 2 times daily without regard to food.  Patient counseled that administering with food may increase tolerability.  Patient knows to avoid grapefruit or grapefruit juice while on therapy with [name].  LyLonie Peaktart date: planned for 03/06/18   Adverse effects include but are not limited to: nausea, vomiting, diarrhea, taste changes, mouth sores, fatigue, constipation, decreased blood counts, and joint pain.  Patient has anti-emetic on hand and knows to take it if nausea develops.   Patient will obtain anti diarrheal and alert the office of 4 or more loose stools above baseline.  Reviewed with patient importance of keeping a medication schedule and plan for any missed doses.  Mrs. Sabrina Vang voiced understanding and appreciation.   All questions answered. Medication reconciliation performed and medication/allergy list updated.  Patient plans to [name], 02/22/2018 after her pump D/C appointment. Copayment for first 30-day supply of [name] $50, this is affordable to patient.  I will plan to see patient in infusion face-to-face next Wednesday, 02/20/2018, to provide follow-up counseling and written information on [name].  Patient knows to call the office with questions or concerns. Oral Oncology Clinic will continue to follow.  JeJohny DrillingPharmD, BCPS, BCOP  02/13/2018   3:44 PM Oral Oncology  Clinic [phone]

## 2018-02-18 ENCOUNTER — Telehealth (INDEPENDENT_AMBULATORY_CARE_PROVIDER_SITE_OTHER): Payer: Self-pay | Admitting: *Deleted

## 2018-02-18 NOTE — Telephone Encounter (Signed)
Sabrina Vang called the office and left the following message on voice mail. She had been being followed by Dr.REhman and Ms Vaughan Basta for a spot on her pancreas. Since this April , she has been being treated for Ovarian Cancer. She has undergone  Chemo , Major Surgery. She rec'd a all clear on the latest scan. She still has 1 infusion, and then a Chemo maintenance medication. She questions if there is a need to have another scan on pancreas?

## 2018-02-20 ENCOUNTER — Telehealth: Payer: Self-pay | Admitting: Pharmacist

## 2018-02-20 ENCOUNTER — Inpatient Hospital Stay: Payer: Medicare HMO

## 2018-02-20 VITALS — BP 130/71 | HR 67 | Temp 98.1°F | Resp 18

## 2018-02-20 DIAGNOSIS — Z5111 Encounter for antineoplastic chemotherapy: Secondary | ICD-10-CM | POA: Diagnosis not present

## 2018-02-20 DIAGNOSIS — R590 Localized enlarged lymph nodes: Secondary | ICD-10-CM

## 2018-02-20 DIAGNOSIS — D696 Thrombocytopenia, unspecified: Secondary | ICD-10-CM | POA: Diagnosis not present

## 2018-02-20 DIAGNOSIS — G620 Drug-induced polyneuropathy: Secondary | ICD-10-CM | POA: Diagnosis not present

## 2018-02-20 DIAGNOSIS — C561 Malignant neoplasm of right ovary: Secondary | ICD-10-CM

## 2018-02-20 DIAGNOSIS — E119 Type 2 diabetes mellitus without complications: Secondary | ICD-10-CM | POA: Diagnosis not present

## 2018-02-20 DIAGNOSIS — Z452 Encounter for adjustment and management of vascular access device: Secondary | ICD-10-CM | POA: Diagnosis not present

## 2018-02-20 LAB — CMP (CANCER CENTER ONLY)
ALT: 14 U/L (ref 0–44)
AST: 19 U/L (ref 15–41)
Albumin: 3.4 g/dL — ABNORMAL LOW (ref 3.5–5.0)
Alkaline Phosphatase: 69 U/L (ref 38–126)
Anion gap: 12 (ref 5–15)
BILIRUBIN TOTAL: 0.3 mg/dL (ref 0.3–1.2)
BUN: 15 mg/dL (ref 8–23)
CALCIUM: 9.2 mg/dL (ref 8.9–10.3)
CO2: 23 mmol/L (ref 22–32)
Chloride: 105 mmol/L (ref 98–111)
Creatinine: 1.03 mg/dL — ABNORMAL HIGH (ref 0.44–1.00)
GFR, Estimated: 55 mL/min — ABNORMAL LOW (ref >60)
Glucose, Bld: 166 mg/dL — ABNORMAL HIGH (ref 70–99)
POTASSIUM: 4.1 mmol/L (ref 3.5–5.1)
SODIUM: 140 mmol/L (ref 135–145)
TOTAL PROTEIN: 7 g/dL (ref 6.5–8.1)

## 2018-02-20 LAB — CBC WITH DIFFERENTIAL (CANCER CENTER ONLY)
ABS IMMATURE GRANULOCYTES: 0.01 10*3/uL (ref 0.00–0.07)
BASOS PCT: 1 %
Basophils Absolute: 0 10*3/uL (ref 0.0–0.1)
EOS ABS: 0.1 10*3/uL (ref 0.0–0.5)
Eosinophils Relative: 3 %
HCT: 38.1 % (ref 36.0–46.0)
Hemoglobin: 12.3 g/dL (ref 12.0–15.0)
Immature Granulocytes: 0 %
Lymphocytes Relative: 36 %
Lymphs Abs: 1.8 10*3/uL (ref 0.7–4.0)
MCH: 30.4 pg (ref 26.0–34.0)
MCHC: 32.3 g/dL (ref 30.0–36.0)
MCV: 94.3 fL (ref 80.0–100.0)
MONO ABS: 0.5 10*3/uL (ref 0.1–1.0)
MONOS PCT: 10 %
Neutro Abs: 2.6 10*3/uL (ref 1.7–7.7)
Neutrophils Relative %: 50 %
PLATELETS: 111 10*3/uL — AB (ref 150–400)
RBC: 4.04 MIL/uL (ref 3.87–5.11)
RDW: 14.6 % (ref 11.5–15.5)
WBC Count: 5.1 10*3/uL (ref 4.0–10.5)
nRBC: 0 % (ref 0.0–0.2)

## 2018-02-20 MED ORDER — DEXTROSE 5 % IV SOLN
Freq: Once | INTRAVENOUS | Status: AC
  Administered 2018-02-20: 09:00:00 via INTRAVENOUS
  Filled 2018-02-20: qty 250

## 2018-02-20 MED ORDER — HEPARIN SOD (PORK) LOCK FLUSH 100 UNIT/ML IV SOLN
500.0000 [IU] | Freq: Once | INTRAVENOUS | Status: DC | PRN
  Filled 2018-02-20: qty 5

## 2018-02-20 MED ORDER — PALONOSETRON HCL INJECTION 0.25 MG/5ML
INTRAVENOUS | Status: AC
  Filled 2018-02-20: qty 5

## 2018-02-20 MED ORDER — OXALIPLATIN CHEMO INJECTION 100 MG/20ML
68.0000 mg/m2 | Freq: Once | INTRAVENOUS | Status: AC
  Administered 2018-02-20: 120 mg via INTRAVENOUS
  Filled 2018-02-20: qty 20

## 2018-02-20 MED ORDER — LEUCOVORIN CALCIUM INJECTION 350 MG
400.0000 mg/m2 | Freq: Once | INTRAVENOUS | Status: AC
  Administered 2018-02-20: 700 mg via INTRAVENOUS
  Filled 2018-02-20: qty 35

## 2018-02-20 MED ORDER — DEXAMETHASONE SODIUM PHOSPHATE 10 MG/ML IJ SOLN
10.0000 mg | Freq: Once | INTRAMUSCULAR | Status: AC
  Administered 2018-02-20: 10 mg via INTRAVENOUS

## 2018-02-20 MED ORDER — DEXAMETHASONE SODIUM PHOSPHATE 10 MG/ML IJ SOLN
INTRAMUSCULAR | Status: AC
  Filled 2018-02-20: qty 1

## 2018-02-20 MED ORDER — FLUOROURACIL CHEMO INJECTION 500 MG/10ML
200.0000 mg/m2 | Freq: Once | INTRAVENOUS | Status: AC
  Administered 2018-02-20: 350 mg via INTRAVENOUS
  Filled 2018-02-20: qty 7

## 2018-02-20 MED ORDER — SODIUM CHLORIDE 0.9% FLUSH
10.0000 mL | INTRAVENOUS | Status: DC | PRN
  Administered 2018-02-20: 10 mL
  Filled 2018-02-20: qty 10

## 2018-02-20 MED ORDER — SODIUM CHLORIDE 0.9% FLUSH
10.0000 mL | Freq: Once | INTRAVENOUS | Status: AC
  Administered 2018-02-20: 10 mL
  Filled 2018-02-20: qty 10

## 2018-02-20 MED ORDER — SODIUM CHLORIDE 0.9 % IV SOLN
2400.0000 mg/m2 | INTRAVENOUS | Status: DC
  Administered 2018-02-20: 4200 mg via INTRAVENOUS
  Filled 2018-02-20: qty 84

## 2018-02-20 MED ORDER — PALONOSETRON HCL INJECTION 0.25 MG/5ML
0.2500 mg | Freq: Once | INTRAVENOUS | Status: AC
  Administered 2018-02-20: 0.25 mg via INTRAVENOUS

## 2018-02-20 NOTE — Patient Instructions (Addendum)
[facility] Discharge Instructions for Patients Receiving Chemotherapy  Today you received the following chemotherapy agents oxaliplatin (Eloxatin), Leucovorin and fluorouracil (Adrucil/5FU)   To help prevent nausea and vomiting after your treatment, we encourage you to take your nausea medication as directed.    If you develop nausea and vomiting that is not controlled by your nausea medication, call the clinic.   BELOW ARE SYMPTOMS THAT SHOULD BE REPORTED IMMEDIATELY:  *FEVER GREATER THAN 100.5 F  *CHILLS WITH OR WITHOUT FEVER  NAUSEA AND VOMITING THAT IS NOT CONTROLLED WITH YOUR NAUSEA MEDICATION  *UNUSUAL SHORTNESS OF BREATH  *UNUSUAL BRUISING OR BLEEDING  TENDERNESS IN MOUTH AND THROAT WITH OR WITHOUT PRESENCE OF ULCERS  *URINARY PROBLEMS  *BOWEL PROBLEMS  UNUSUAL RASH Items with * indicate a potential emergency and should be followed up as soon as possible.  Feel free to call the clinic should you have any questions or concerns. The clinic phone number is [phone].  Please show the [place] at check-in to the Emergency Department and triage nurse.

## 2018-02-20 NOTE — Telephone Encounter (Signed)
Oral Chemotherapy Pharmacist Encounter   I spoke with patient and husband today in infusion room for overview of: [name] (olaparib)for the treatment of stage IV ovarian carcinoma, with known BRCA mutation following treatment with platinum-based chemotherapy, planned durationuntil disease progression or unacceptable toxicity.  Counseled patienton administration, dosing, side effects, monitoring, drug-food interactions, safe handling, storage, and disposal.  Patient will take [name] 100mg tablets, 2 tablets (200mg) by mouth 2 times daily without regard to food.  Patient counseled that administering with food may increase tolerability.  Patient knows to avoid grapefruit or grapefruit juice while on therapy with [name].  [name] start date: planned for 03/06/18   Adverse effects include but are not limited to: nausea, vomiting, diarrhea, taste changes, mouth sores, fatigue, constipation, decreased blood counts, and joint pain.  Patient has anti-emetic on hand and knows to take it if nausea develops.   Patient will obtain anti diarrheal and alert the office of 4 or more loose stools above baseline.  Reviewed with patient importance of keeping a medication schedule and plan for any missed doses.  Mr. and Mrs. Barcelo voiced understanding and appreciation.   All questions answered. Medication reconciliation performed and medication/allergy list updated.  Patient plans to pick up her first fill of [name] from the [facility] outpatient pharmacy on Friday, 02/22/2018 after her pump D/C appointment. Copayment for first 30-day supply of [name] is $50, this is affordable to patient. I provided contact information and address of dispensing pharmacy to patient.   Patient knows to call the office with questions or concerns. Oral Oncology Clinic will continue to follow.  Jesse Mack, PharmD, BCPS, BCOP  02/20/2018   9:49 AM Oral Oncology Clinic [phone] 

## 2018-02-22 ENCOUNTER — Inpatient Hospital Stay: Payer: Medicare HMO | Attending: Obstetrics

## 2018-02-22 DIAGNOSIS — Z452 Encounter for adjustment and management of vascular access device: Secondary | ICD-10-CM | POA: Diagnosis not present

## 2018-02-22 DIAGNOSIS — C561 Malignant neoplasm of right ovary: Secondary | ICD-10-CM | POA: Diagnosis present

## 2018-02-22 MED ORDER — SODIUM CHLORIDE 0.9% FLUSH
10.0000 mL | Freq: Once | INTRAVENOUS | Status: AC
  Administered 2018-02-22: 10 mL
  Filled 2018-02-22: qty 10

## 2018-02-22 MED ORDER — HEPARIN SOD (PORK) LOCK FLUSH 100 UNIT/ML IV SOLN
500.0000 [IU] | Freq: Once | INTRAVENOUS | Status: AC
  Administered 2018-02-22: 500 [IU]
  Filled 2018-02-22: qty 5

## 2018-02-22 MED FILL — LYNPARZA 100 MG TAB: 100 | 30 days supply | Qty: 120 | Fill #0

## 2018-02-22 NOTE — Patient Instructions (Signed)

## 2018-02-22 NOTE — Telephone Encounter (Signed)
Oral Oncology Patient Advocate Encounter  Confirmed with Rulo that [name] was picked up on 02/22/18 with a $50 copay.   [place] Patient [place] Phone [phone] Fax [phone]

## 2018-02-22 NOTE — Telephone Encounter (Signed)
Dr.Rehman reviewed the patient's recent Scans. The last on 08/11/2017 showed nothing on the Pancreas. He recommends that the patient have the MRI repeated in 1 year. Patient was called and made aware. She is going today to have Chemo pump disconnected , and she will start the oral chemo maintenance 03/06/2018.

## 2018-03-12 ENCOUNTER — Telehealth: Payer: Self-pay | Admitting: Hematology and Oncology

## 2018-03-12 NOTE — Telephone Encounter (Signed)
Patient called wanted to add flush appointment for 11/27

## 2018-03-13 ENCOUNTER — Inpatient Hospital Stay: Payer: Medicare HMO

## 2018-03-13 ENCOUNTER — Telehealth: Payer: Self-pay | Admitting: Oncology

## 2018-03-13 DIAGNOSIS — R590 Localized enlarged lymph nodes: Secondary | ICD-10-CM

## 2018-03-13 DIAGNOSIS — C561 Malignant neoplasm of right ovary: Secondary | ICD-10-CM | POA: Diagnosis not present

## 2018-03-13 DIAGNOSIS — Z452 Encounter for adjustment and management of vascular access device: Secondary | ICD-10-CM | POA: Diagnosis not present

## 2018-03-13 LAB — CMP (CANCER CENTER ONLY)
ALBUMIN: 3.6 g/dL (ref 3.5–5.0)
ALK PHOS: 64 U/L (ref 38–126)
ALT: 11 U/L (ref 0–44)
ANION GAP: 9 (ref 5–15)
AST: 23 U/L (ref 15–41)
BILIRUBIN TOTAL: 0.3 mg/dL (ref 0.3–1.2)
BUN: 13 mg/dL (ref 8–23)
CALCIUM: 10.4 mg/dL — AB (ref 8.9–10.3)
CO2: 28 mmol/L (ref 22–32)
Chloride: 105 mmol/L (ref 98–111)
Creatinine: 1.09 mg/dL — ABNORMAL HIGH (ref 0.44–1.00)
GFR, Est AFR Am: 59 mL/min — ABNORMAL LOW (ref >60)
GFR, Estimated: 51 mL/min — ABNORMAL LOW (ref >60)
GLUCOSE: 94 mg/dL (ref 70–99)
POTASSIUM: 4.4 mmol/L (ref 3.5–5.1)
SODIUM: 142 mmol/L (ref 135–145)
TOTAL PROTEIN: 7.6 g/dL (ref 6.5–8.1)

## 2018-03-13 LAB — CBC WITH DIFFERENTIAL (CANCER CENTER ONLY)
ABS IMMATURE GRANULOCYTES: 0.01 10*3/uL (ref 0.00–0.07)
BASOS PCT: 1 %
Basophils Absolute: 0.1 10*3/uL (ref 0.0–0.1)
Eosinophils Absolute: 0.1 10*3/uL (ref 0.0–0.5)
Eosinophils Relative: 2 %
HEMATOCRIT: 41 % (ref 36.0–46.0)
HEMOGLOBIN: 13 g/dL (ref 12.0–15.0)
Immature Granulocytes: 0 %
LYMPHS PCT: 43 %
Lymphs Abs: 2.2 10*3/uL (ref 0.7–4.0)
MCH: 30.5 pg (ref 26.0–34.0)
MCHC: 31.7 g/dL (ref 30.0–36.0)
MCV: 96.2 fL (ref 80.0–100.0)
MONO ABS: 0.5 10*3/uL (ref 0.1–1.0)
MONOS PCT: 11 %
NEUTROS ABS: 2.2 10*3/uL (ref 1.7–7.7)
NEUTROS PCT: 43 %
Platelet Count: 147 10*3/uL — ABNORMAL LOW (ref 150–400)
RBC: 4.26 MIL/uL (ref 3.87–5.11)
RDW: 15.8 % — ABNORMAL HIGH (ref 11.5–15.5)
WBC Count: 5.1 10*3/uL (ref 4.0–10.5)
nRBC: 0 % (ref 0.0–0.2)

## 2018-03-13 NOTE — Telephone Encounter (Signed)
Called Sabrina Vang and advised her that her labs were good and to continue on [place] per Emeline Darling, [facility].  Aliscia verbalized agreement and denied having any side effects.

## 2018-03-14 LAB — CA 125: Cancer Antigen (CA) 125: 33.8 U/mL (ref 0.0–38.1)

## 2018-03-20 ENCOUNTER — Inpatient Hospital Stay: Payer: Medicare HMO

## 2018-03-20 ENCOUNTER — Telehealth: Payer: Self-pay

## 2018-03-20 DIAGNOSIS — C561 Malignant neoplasm of right ovary: Secondary | ICD-10-CM

## 2018-03-20 DIAGNOSIS — Z452 Encounter for adjustment and management of vascular access device: Secondary | ICD-10-CM | POA: Diagnosis not present

## 2018-03-20 DIAGNOSIS — R590 Localized enlarged lymph nodes: Secondary | ICD-10-CM

## 2018-03-20 LAB — CMP (CANCER CENTER ONLY)
ALT: 13 U/L (ref 0–44)
AST: 20 U/L (ref 15–41)
Albumin: 3.7 g/dL (ref 3.5–5.0)
Alkaline Phosphatase: 63 U/L (ref 38–126)
Anion gap: 9 (ref 5–15)
BUN: 15 mg/dL (ref 8–23)
CO2: 28 mmol/L (ref 22–32)
Calcium: 9.9 mg/dL (ref 8.9–10.3)
Chloride: 106 mmol/L (ref 98–111)
Creatinine: 0.93 mg/dL (ref 0.44–1.00)
Glucose, Bld: 102 mg/dL — ABNORMAL HIGH (ref 70–99)
POTASSIUM: 4.1 mmol/L (ref 3.5–5.1)
SODIUM: 143 mmol/L (ref 135–145)
Total Bilirubin: 0.3 mg/dL (ref 0.3–1.2)
Total Protein: 7.4 g/dL (ref 6.5–8.1)

## 2018-03-20 LAB — CBC WITH DIFFERENTIAL (CANCER CENTER ONLY)
Abs Immature Granulocytes: 0.01 10*3/uL (ref 0.00–0.07)
Basophils Absolute: 0.1 10*3/uL (ref 0.0–0.1)
Basophils Relative: 1 %
EOS PCT: 2 %
Eosinophils Absolute: 0.1 10*3/uL (ref 0.0–0.5)
HEMATOCRIT: 38.4 % (ref 36.0–46.0)
HEMOGLOBIN: 12.3 g/dL (ref 12.0–15.0)
Immature Granulocytes: 0 %
Lymphocytes Relative: 37 %
Lymphs Abs: 2.3 10*3/uL (ref 0.7–4.0)
MCH: 30.5 pg (ref 26.0–34.0)
MCHC: 32 g/dL (ref 30.0–36.0)
MCV: 95.3 fL (ref 80.0–100.0)
Monocytes Absolute: 0.5 10*3/uL (ref 0.1–1.0)
Monocytes Relative: 8 %
NEUTROS ABS: 3.2 10*3/uL (ref 1.7–7.7)
NEUTROS PCT: 52 %
NRBC: 0 % (ref 0.0–0.2)
Platelet Count: 143 10*3/uL — ABNORMAL LOW (ref 150–400)
RBC: 4.03 MIL/uL (ref 3.87–5.11)
RDW: 15.9 % — ABNORMAL HIGH (ref 11.5–15.5)
WBC Count: 6.2 10*3/uL (ref 4.0–10.5)

## 2018-03-20 MED ORDER — SODIUM CHLORIDE 0.9% FLUSH
10.0000 mL | Freq: Once | INTRAVENOUS | Status: AC
  Administered 2018-03-20: 10 mL
  Filled 2018-03-20: qty 10

## 2018-03-20 MED ORDER — HEPARIN SOD (PORK) LOCK FLUSH 100 UNIT/ML IV SOLN
500.0000 [IU] | Freq: Once | INTRAVENOUS | Status: AC
  Administered 2018-03-20: 500 [IU]
  Filled 2018-03-20: qty 5

## 2018-03-20 NOTE — Telephone Encounter (Signed)
Called Sabrina Vang and advised her that her labs were good and to continue on [place] per Emeline Darling, [facility].  She verbalized understanding and denied any problems.

## 2018-03-20 NOTE — Patient Instructions (Signed)
Implanted Port Home Guide An implanted port is a type of central line that is placed under the skin. Central lines are used to provide IV access when treatment or nutrition needs to be given through a person's veins. Implanted ports are used for long-term IV access. An implanted port may be placed because:  You need IV medicine that would be irritating to the small veins in your hands or arms.  You need long-term IV medicines, such as antibiotics.  You need IV nutrition for a long period.  You need frequent blood draws for lab tests.  You need dialysis.  Implanted ports are usually placed in the chest area, but they can also be placed in the upper arm, the abdomen, or the leg. An implanted port has two main parts:  Reservoir. The reservoir is round and will appear as a small, raised area under your skin. The reservoir is the part where a needle is inserted to give medicines or draw blood.  Catheter. The catheter is a thin, flexible tube that extends from the reservoir. The catheter is placed into a large vein. Medicine that is inserted into the reservoir goes into the catheter and then into the vein.  How will I care for my incision site? Do not get the incision site wet. Bathe or shower as directed by your health care provider. How is my port accessed? Special steps must be taken to access the port:  Before the port is accessed, a numbing cream can be placed on the skin. This helps numb the skin over the port site.  Your health care provider uses a sterile technique to access the port. ? Your health care provider must put on a mask and sterile gloves. ? The skin over your port is cleaned carefully with an antiseptic and allowed to dry. ? The port is gently pinched between sterile gloves, and a needle is inserted into the port.  Only "non-coring" port needles should be used to access the port. Once the port is accessed, a blood return should be checked. This helps ensure that the port  is in the vein and is not clogged.  If your port needs to remain accessed for a constant infusion, a clear (transparent) bandage will be placed over the needle site. The bandage and needle will need to be changed every week, or as directed by your health care provider.  Keep the bandage covering the needle clean and dry. Do not get it wet. Follow your health care provider's instructions on how to take a shower or bath while the port is accessed.  If your port does not need to stay accessed, no bandage is needed over the port.  What is flushing? Flushing helps keep the port from getting clogged. Follow your health care provider's instructions on how and when to flush the port. Ports are usually flushed with saline solution or a medicine called heparin. The need for flushing will depend on how the port is used.  If the port is used for intermittent medicines or blood draws, the port will need to be flushed: ? After medicines have been given. ? After blood has been drawn. ? As part of routine maintenance.  If a constant infusion is running, the port may not need to be flushed.  How long will my port stay implanted? The port can stay in for as long as your health care provider thinks it is needed. When it is time for the port to come out, surgery will be   done to remove it. The procedure is similar to the one performed when the port was put in. When should I seek immediate medical care? When you have an implanted port, you should seek immediate medical care if:  You notice a bad smell coming from the incision site.  You have swelling, redness, or drainage at the incision site.  You have more swelling or pain at the port site or the surrounding area.  You have a fever that is not controlled with medicine.  This information is not intended to replace advice given to you by your health care provider. Make sure you discuss any questions you have with your health care provider. Document  Released: 04/10/2005 Document Revised: 09/16/2015 Document Reviewed: 12/16/2012 Elsevier Interactive Patient Education  2017 [employer]  

## 2018-03-26 MED FILL — LYNPARZA 100 MG TAB: 100 | 30 days supply | Qty: 120 | Fill #1

## 2018-03-27 ENCOUNTER — Inpatient Hospital Stay: Payer: Medicare HMO | Attending: Obstetrics

## 2018-03-27 ENCOUNTER — Inpatient Hospital Stay: Payer: Medicare HMO | Admitting: Hematology and Oncology

## 2018-03-27 ENCOUNTER — Telehealth: Payer: Self-pay | Admitting: Hematology and Oncology

## 2018-03-27 ENCOUNTER — Inpatient Hospital Stay: Payer: Medicare HMO

## 2018-03-27 ENCOUNTER — Encounter: Payer: Self-pay | Admitting: Hematology and Oncology

## 2018-03-27 DIAGNOSIS — D61818 Other pancytopenia: Secondary | ICD-10-CM | POA: Insufficient documentation

## 2018-03-27 DIAGNOSIS — C561 Malignant neoplasm of right ovary: Secondary | ICD-10-CM | POA: Diagnosis not present

## 2018-03-27 DIAGNOSIS — Z90722 Acquired absence of ovaries, bilateral: Secondary | ICD-10-CM | POA: Insufficient documentation

## 2018-03-27 DIAGNOSIS — C786 Secondary malignant neoplasm of retroperitoneum and peritoneum: Secondary | ICD-10-CM | POA: Diagnosis not present

## 2018-03-27 DIAGNOSIS — Z9221 Personal history of antineoplastic chemotherapy: Secondary | ICD-10-CM | POA: Insufficient documentation

## 2018-03-27 DIAGNOSIS — Z452 Encounter for adjustment and management of vascular access device: Secondary | ICD-10-CM | POA: Diagnosis not present

## 2018-03-27 DIAGNOSIS — R590 Localized enlarged lymph nodes: Secondary | ICD-10-CM

## 2018-03-27 DIAGNOSIS — Z90710 Acquired absence of both cervix and uterus: Secondary | ICD-10-CM | POA: Insufficient documentation

## 2018-03-27 LAB — CBC WITH DIFFERENTIAL (CANCER CENTER ONLY)
ABS IMMATURE GRANULOCYTES: 0.02 10*3/uL (ref 0.00–0.07)
Basophils Absolute: 0 10*3/uL (ref 0.0–0.1)
Basophils Relative: 1 %
EOS PCT: 2 %
Eosinophils Absolute: 0.1 10*3/uL (ref 0.0–0.5)
HEMATOCRIT: 36.2 % (ref 36.0–46.0)
HEMOGLOBIN: 11.8 g/dL — AB (ref 12.0–15.0)
Immature Granulocytes: 0 %
LYMPHS ABS: 2.3 10*3/uL (ref 0.7–4.0)
LYMPHS PCT: 35 %
MCH: 31.1 pg (ref 26.0–34.0)
MCHC: 32.6 g/dL (ref 30.0–36.0)
MCV: 95.5 fL (ref 80.0–100.0)
MONO ABS: 0.5 10*3/uL (ref 0.1–1.0)
MONOS PCT: 7 %
NEUTROS ABS: 3.8 10*3/uL (ref 1.7–7.7)
NEUTROS PCT: 55 %
NRBC: 0 % (ref 0.0–0.2)
Platelet Count: 148 10*3/uL — ABNORMAL LOW (ref 150–400)
RBC: 3.79 MIL/uL — AB (ref 3.87–5.11)
RDW: 16.3 % — ABNORMAL HIGH (ref 11.5–15.5)
WBC Count: 6.8 10*3/uL (ref 4.0–10.5)

## 2018-03-27 LAB — CMP (CANCER CENTER ONLY)
ALT: 10 U/L (ref 0–44)
ANION GAP: 9 (ref 5–15)
AST: 18 U/L (ref 15–41)
Albumin: 3.7 g/dL (ref 3.5–5.0)
Alkaline Phosphatase: 58 U/L (ref 38–126)
BUN: 14 mg/dL (ref 8–23)
CHLORIDE: 106 mmol/L (ref 98–111)
CO2: 27 mmol/L (ref 22–32)
Calcium: 9.7 mg/dL (ref 8.9–10.3)
Creatinine: 0.92 mg/dL (ref 0.44–1.00)
GFR, Estimated: >60 mL/min (ref >60)
Glucose, Bld: 90 mg/dL (ref 70–99)
POTASSIUM: 4 mmol/L (ref 3.5–5.1)
Sodium: 142 mmol/L (ref 135–145)
Total Bilirubin: 0.4 mg/dL (ref 0.3–1.2)
Total Protein: 7.2 g/dL (ref 6.5–8.1)

## 2018-03-27 MED ORDER — HEPARIN SOD (PORK) LOCK FLUSH 100 UNIT/ML IV SOLN
500.0000 [IU] | Freq: Once | INTRAVENOUS | Status: AC
  Administered 2018-03-27: 500 [IU]
  Filled 2018-03-27: qty 5

## 2018-03-27 MED ORDER — SODIUM CHLORIDE 0.9% FLUSH
10.0000 mL | Freq: Once | INTRAVENOUS | Status: AC
  Administered 2018-03-27: 10 mL
  Filled 2018-03-27: qty 10

## 2018-03-27 NOTE — Assessment & Plan Note (Signed)
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

## 2018-03-27 NOTE — Progress Notes (Signed)
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   No orders of the defined types were placed in this encounter.   INTERVAL HISTORY: Please see below for problem oriented charting. She returns for further follow-up She tolerated treatment very well No recent infection, fever or chills No nausea, changes in bowel habits or bloating She is active in all activities of daily living  SUMMARY OF ONCOLOGIC HISTORY: Oncology History   ER 15-20%, PR 5-10%, Her2/neu negative MMR normal Negative genetic testing HRD pos BRCA1 positive MSI Stable     Right ovarian epithelial cancer ([place])   07/30/2017 Imaging    US pelvis Ultrasound revealed a complex cystic mass in the right adnexa measuring 20 x 11 x 12 cm with multiple internal septations some of which are thick. The left adnexa measured 12.7 x 11.6 x 8.1 with low level echoes and soft tissue nodules     07/30/2017 Tumor Marker    Patient's tumor was tested for the following markers: CA-125 Results of the tumor marker test revealed 521.3    08/17/2017 Pathology Results    The malignant cells are positive for PAX-8, cytokeratin 7, estrogen receptor, and faintly positive for GATA-3. They are negative for p53, GCDFP, and cytokeratin 20. The finding are consistent with a gynecologic primary carcinoma. Additional studies can be performed  upon clinician request.    08/17/2017 Procedure    Technically successful CT-guided left lower quadrant omental mass core biopsy.    08/20/2017 PET scan    1. Cystic masses arising from the pelvis. The nodular component of the RIGHT cystic mass is intensely hypermetabolic consistent with malignant ovarian neoplasm. 2. Extensive hypermetabolic peritoneal thickening in the lower abdomen and upper pelvis, upper abdomen, and upper abdominal precordial fat and paradiaphragmatic fat. 3. Retroperitoneal nodal metastasis adjacent to the IVC at the level the kidneys. 4. No evidence of metastatic disease in the thorax other small effusion on the LEFT and nodal metastasis in the fat superior to the diaphragm. 5. Mild metabolic activity associated the distal esophagus is favored benign esophagitis.    08/20/2017 Imaging    CT chest 1. Bilateral cardiophrenic angle nodal metastasis. No additional findings to suggest metastatic disease to the chest. 2. Small left pleural effusion. 3. Peritoneal carcinomatosis noted within the abdomen. 4. Hepatic steatosis.     08/24/2017 Cancer Staging    Staging form: Ovary, Fallopian Tube, and Primary Peritoneal Carcinoma, AJCC 8th Edition - Clinical: Stage IV (cT3, cN1, cM1) - Signed by [name], MD on 08/24/2017    08/31/2017 Tumor Marker    Patient's tumor was tested for the following markers: CA-125 Results of the tumor marker test revealed 819.9    09/26/2017 Adverse Reaction    She developed reaction to Taxol, managed successfully with additional premedications    10/17/2017 Tumor Marker    Patient's tumor was tested for the following markers: CA-125 Results of the tumor marker test revealed 374.4  10/31/2017 Imaging    1. No significant change in size of the large complex bilateral adnexal masses consistent with the known history of ovarian cancer. There is increased dependent density within the left-sided lesion. 2. Stable peritoneal carcinomatosis.  No  significant ascites.  3. The bilateral pericardiac adenopathy has improved. No progressive thoracic metastatic disease. No pulmonary or osseous metastatic disease. 4. Suspicion of nonocclusive thrombus in the right internal jugular vein related to the right IJ port. Recommend further evaluation with Doppler ultrasound.    11/13/2017 Surgery    Preoperative Diagnosis: Ovarian cancer s/p neoadjuvant chemotherapy   Procedure(s) Performed:   1. Exploratory laparotomy  2. Bilateral salpingo-oophorectomy with radical tumor debulking for ovarian cancer   including retroperitoneal dissection, lysis of adhesions (enterolysis of small bowel in deep pelvis, left adnexal adhesions to sigmoid and omentum to LLQ) ~1 hour, ureterolysis. 3. Omentectomy  4. Takedown of splenic flexure with removal of omental tumor in left upper quadrant  Specimens: Bilateral tubes and ovaries, omentum, splenic flexure nodule, right paracolic gutter nodule, cecal nodule, right ureteral peritoneal nodule, small bowel mesentary.  Indication for Procedure:  Patient is s/p 3 cycles of chemotherapy with response based on CA125 and decreased mediastinal disease (to <1cm).  Operative Findings:  This represented an optimal cytoreduction with gross residual disease remaining in the deep pelvis near the levator floor and possibly in the region of the right IP ligament/periappendicial region.  Upon entry a large ~20cm right tube/ovary, mostly cystic. Adherent rind along ileocecal region and appendix. Large cystic left tube/ovary ~10cm with sigmoid colon stretched across the mass and extension of the cystic lesion into the deep pelvis with residual palpable disease deep pelvis near levators. Estimate ~1cm or less on palpation, not visible disease. Omental caking in the LLQ adherent to pelvic sidewall. Omental disease noted RUQ separate. In addition ~1.5cm lesion in splenic flexure omentum. Evidence of prior diaphragmatic disease, no  visible disease.       11/13/2017 Pathology Results    1. 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 - TUMOR INVOLVES THE OVARIAN SURFACE. - RIGHT FALLOPIAN TUBE IS INVOLVED. - SEE ONCOLOGY TABLE. - SEE NOTE. 2. Soft tissue mass, biopsy, cecal gutter nodule - METASTATIC MUCINOUS ADENOCARCINOMA. 3. Ovary and fallopian tube, left, left ovary and fallopian tube - METASTATIC MUCINOUS ADENOCARCINOMA TO LEFT OVARY AND FALLOPIAN TUBE. 4. Omentum, resection for tumor - METASTATIC MUCINOUS ADENOCARCINOMA TO OMENTUM. 5. Soft tissue mass, biopsy, splenic flexure nodule - METASTATIC MUCINOUS ADENOCARCINOMA. 6. Soft tissue mass, biopsy, cecal implant - METASTATIC MUCINOUS ADENOCARCINOMA. 7. Soft tissue mass, biopsy, right ureteral peritoneal implant - METASTATIC MUCINOUS ADENOCARCINOMA. 8. Mesentery, small bowel nodule - METASTATIC MUCINOUS ADENOCARCINOMA. Microscopic Comment 1. OVARY or FALLOPIAN TUBE or PRIMARY PERITONEUM: Procedure: Bilateral salpingo-oophorectomy. Specimen Integrity: Intact. Tumor Site: Right ovary. Ovarian Surface Involvement: Present. Fallopian Tube Surface Involvement: Present. Tumor Size: 20 cm. Histologic Type: Mucinous adenocarcinoma. Histologic Grade: Overall G2 (moderately differentiated) (focal areas of poor differentiation are present). Implants: Present. Other Tissue/ Organ Involvement: Left ovary, left fallopian tube and omentum. Largest Extrapelvic Peritoneal Focus: less than 2 cm. See note Peritoneal/Ascitic Fluid: N/A Treatment Effect: No definite or minimal response identified (chemotherapy response score 1 [CRS 1]) Regional Lymph Nodes No lymph nodes submitted or found Number of Lymph Nodes Examined: 0 Pathologic Stage Classification (pTNM, AJCC 8th Edition): ypT3b, ypNX. Representative Tumor Block: 1D and 1E. (NDK:gt, 11/15/17)    11/19/2017 Genetic Testing    Negative genetic  testing on the  [facility] panel.  The [facility] gene panel offered by [employer] includes sequencing and deletion/duplication testing of the following 35 genes: APC, ATM, AXIN2, BARD1, BMPR1A, BRCA1, BRCA2, BRIP1, CHD1, CDK4, CDKN2A, CHEK2, EPCAM (large rearrangement only), HOXB13, (sequencing only), GALNT12, MLH1, MSH2, MSH3 (excluding repetitive portions of exon 1), MSH6, MUTYH, NBN, NTHL1, PALB2, PMS2, PTEN, RAD51C, RAD51D, RNF43, RPS20, SMAD4, STK11, and TP53. Sequencing was performed for select regions of POLE and POLD1, and large rearrangement analysis was performed for select regions of GREM1. The report date is November 19, 2017.  HRD tumor results indicate a BRCA1 mutation identified in the ovarian tumor causing genomic instability. The report date is 12/04/2017      Genetic Testing    Patient has genetic testing done for ER/PR and Her2/neu. Results revealed patient has the following: ER 15-20% PR 5-10% Her2/neu - negative     Genetic Testing    Patient has genetic testing done for MSI/MMR. Results revealed patient has the following mutation(s): MMR normal, MSI stable    12/10/2017 Imaging    1. Interval resection of large bilateral adnexal cystic masses. Residual soft tissue/tumor within bilateral adnexal regions as above. 2. Interval resolution of previously noted omental cake which may reflect interval omentectomy. There is a new peritoneal deposit identified within the left upper quadrant of the abdomen involving the anterior surface of the spleen.    12/10/2017 Tumor Marker    Patient's tumor was tested for the following markers: CA-125 Results of the tumor marker test revealed 94.9    12/11/2017 - 02/20/2018 Chemotherapy    The patient had FOLFOX regimen for mucinous    01/09/2018 Tumor Marker    Patient's tumor was tested for the following markers: CA-125 Results of the tumor marker test revealed 37    01/31/2018 Imaging    Resolution of peritoneal soft tissue density along  the anterior margin of the spleen since prior study. No evidence of residual or progressive metastatic disease.  Mild sigmoid diverticulosis, without radiographic evidence of diverticulitis. Resolution of small pericolonic abscess along the left lateral pelvic sidewall.  Mild hepatic steatosis.    02/06/2018 Tumor Marker    Patient's tumor was tested for the following markers: CA-125 Results of the tumor marker test revealed 36.7    03/06/2018 -  Chemotherapy    The patient is started on [name] as PARP maintenance    03/13/2018 Tumor Marker    Patient's tumor was tested for the following markers: CA-125 Results of the tumor marker test revealed 33.8     REVIEW OF SYSTEMS:   Constitutional: Denies fevers, chills or abnormal weight loss Eyes: Denies blurriness of vision Ears, nose, mouth, throat, and face: Denies mucositis or sore throat Respiratory: Denies cough, dyspnea or wheezes Cardiovascular: Denies palpitation, chest discomfort or lower extremity swelling Gastrointestinal:  Denies nausea, heartburn or change in bowel habits Skin: Denies abnormal skin rashes Lymphatics: Denies new lymphadenopathy or easy bruising Neurological:Denies numbness, tingling or new weaknesses Behavioral/Psych: Mood is stable, no new changes  All other systems were reviewed with the patient and are negative.  I have reviewed the past medical history, past surgical history, social history and family history with the patient and they are unchanged from previous note.  ALLERGIES:  has No Known Allergies.  MEDICATIONS:  Current Outpatient Medications  Medication Sig Dispense Refill  . acetaminophen (TYLENOL) 500 MG tablet Take 500 mg by mouth every 8 (eight) hours as needed for mild pain or moderate pain.    Marland Kitchen  atenolol (TENORMIN) 25 MG tablet Take 25 mg by mouth at bedtime.     Marland Kitchen CALCIUM-MAGNESIUM-VITAMIN D PO Take 1 tablet by mouth daily.    . famotidine (PEPCID) 20 MG tablet Take 20 mg by mouth  as needed for heartburn or indigestion.    . lidocaine-prilocaine (EMLA) cream Apply 1 application topically as needed. 30 g 6  . loratadine (CLARITIN) 10 MG tablet Take 10 mg by mouth daily as needed for allergies.    . metFORMIN (GLUCOPHAGE) 1000 MG tablet Take 500 mg by mouth 2 (two) times daily.   0  . Multiple Vitamin (MULTIVITAMIN WITH MINERALS) TABS tablet Take 1 tablet by mouth daily.    Marland Kitchen olaparib ([name]) 100 MG tablet Take 2 tablets (200 mg total) by mouth 2 (two) times daily. Swallow whole. 120 tablet 11  . ondansetron (ZOFRAN) 8 MG tablet Take 1 tablet (8 mg total) by mouth every 8 (eight) hours as needed for nausea. 30 tablet 3  . prochlorperazine (COMPAZINE) 10 MG tablet Take 1 tablet (10 mg total) by mouth every 6 (six) hours as needed for nausea or vomiting. 30 tablet 0   No current facility-administered medications for this visit.     PHYSICAL EXAMINATION: ECOG PERFORMANCE STATUS: 0 - Asymptomatic  Vitals:   03/27/18 0944  BP: 128/60  Pulse: 71  Resp: 18  Temp: 98.4 F (36.9 C)  SpO2: 100%   Filed Weights   03/27/18 0944  Weight: 142 lb 9.6 oz (64.7 kg)    GENERAL:alert, no distress and comfortable SKIN: skin color, texture, turgor are normal, no rashes or significant lesions EYES: normal, Conjunctiva are pink and non-injected, sclera clear OROPHARYNX:no exudate, no erythema and lips, buccal mucosa, and tongue normal  NECK: supple, thyroid normal size, non-tender, without nodularity LYMPH:  no palpable lymphadenopathy in the cervical, axillary or inguinal LUNGS: clear to auscultation and percussion with normal breathing effort HEART: regular rate & rhythm and no murmurs and no lower extremity edema ABDOMEN:abdomen soft, non-tender and normal bowel sounds Musculoskeletal:no cyanosis of digits and no clubbing  NEURO: alert & oriented x 3 with fluent speech, no focal motor/sensory deficits  LABORATORY DATA:  I have reviewed the data as listed    Component  Value Date/Time   NA 142 03/27/2018 0857   K 4.0 03/27/2018 0857   CL 106 03/27/2018 0857   CO2 27 03/27/2018 0857   GLUCOSE 90 03/27/2018 0857   BUN 14 03/27/2018 0857   CREATININE 0.92 03/27/2018 0857   CREATININE 0.86 06/05/2014 0957   CALCIUM 9.7 03/27/2018 0857   PROT 7.2 03/27/2018 0857   ALBUMIN 3.7 03/27/2018 0857   AST 18 03/27/2018 0857   ALT 10 03/27/2018 0857   ALKPHOS 58 03/27/2018 0857   BILITOT 0.4 03/27/2018 0857   GFRNONAA >60 03/27/2018 0857   GFRAA >60 03/27/2018 0857    No results found for: SPEP, UPEP  Lab Results  Component Value Date   WBC 6.8 03/27/2018   NEUTROABS 3.8 03/27/2018   HGB 11.8 (L) 03/27/2018   HCT 36.2 03/27/2018   MCV 95.5 03/27/2018   PLT 148 (L) 03/27/2018      Chemistry      Component Value Date/Time   NA 142 03/27/2018 0857   K 4.0 03/27/2018 0857   CL 106 03/27/2018 0857   CO2 27 03/27/2018 0857   BUN 14 03/27/2018 0857   CREATININE 0.92 03/27/2018 0857   CREATININE 0.86 06/05/2014 0957      Component Value Date/Time  CALCIUM 9.7 03/27/2018 0857   ALKPHOS 58 03/27/2018 0857   AST 18 03/27/2018 0857   ALT 10 03/27/2018 0857   BILITOT 0.4 03/27/2018 0857      All questions were answered. The patient knows to call the clinic with any problems, questions or concerns. No barriers to learning was detected.  I spent 10 minutes counseling the patient face to face. The total time spent in the appointment was 15 minutes and more than 50% was on counseling and review of test results  [name], MD 03/27/2018 11:00 AM

## 2018-03-27 NOTE — Telephone Encounter (Signed)
Gave patient avs report and appointments for January  °

## 2018-03-27 NOTE — Patient Instructions (Signed)

## 2018-03-27 NOTE — Assessment & Plan Note (Signed)
She tolerated olaparib well without side effects Recent tumor marker showed improved disease control I recommend continue treatment without dose adjustment If her blood count next week is stable, I will switch her to once a month blood count monitoring I will see her back next month for further follow-up with plan to repeat imaging study in February

## 2018-04-03 ENCOUNTER — Inpatient Hospital Stay: Payer: Medicare HMO

## 2018-04-03 ENCOUNTER — Inpatient Hospital Stay: Payer: Medicare HMO | Admitting: Obstetrics

## 2018-04-03 ENCOUNTER — Encounter: Payer: Self-pay | Admitting: Obstetrics

## 2018-04-03 VITALS — BP 130/61 | HR 69 | Temp 97.9°F | Resp 20 | Ht 65.0 in | Wt 142.4 lb

## 2018-04-03 DIAGNOSIS — C561 Malignant neoplasm of right ovary: Secondary | ICD-10-CM | POA: Diagnosis not present

## 2018-04-03 DIAGNOSIS — D61818 Other pancytopenia: Secondary | ICD-10-CM | POA: Diagnosis not present

## 2018-04-03 DIAGNOSIS — Z452 Encounter for adjustment and management of vascular access device: Secondary | ICD-10-CM | POA: Diagnosis not present

## 2018-04-03 DIAGNOSIS — Z9221 Personal history of antineoplastic chemotherapy: Secondary | ICD-10-CM

## 2018-04-03 DIAGNOSIS — R590 Localized enlarged lymph nodes: Secondary | ICD-10-CM

## 2018-04-03 DIAGNOSIS — Z90710 Acquired absence of both cervix and uterus: Secondary | ICD-10-CM

## 2018-04-03 DIAGNOSIS — Z90722 Acquired absence of ovaries, bilateral: Secondary | ICD-10-CM

## 2018-04-03 DIAGNOSIS — C786 Secondary malignant neoplasm of retroperitoneum and peritoneum: Secondary | ICD-10-CM | POA: Diagnosis not present

## 2018-04-03 LAB — CBC WITH DIFFERENTIAL (CANCER CENTER ONLY)
Abs Immature Granulocytes: 0.02 10*3/uL (ref 0.00–0.07)
Basophils Absolute: 0.1 10*3/uL (ref 0.0–0.1)
Basophils Relative: 1 %
Eosinophils Absolute: 0.2 10*3/uL (ref 0.0–0.5)
Eosinophils Relative: 3 %
HCT: 38.1 % (ref 36.0–46.0)
Hemoglobin: 12.2 g/dL (ref 12.0–15.0)
Immature Granulocytes: 0 %
Lymphocytes Relative: 36 %
Lymphs Abs: 2.2 10*3/uL (ref 0.7–4.0)
MCH: 32 pg (ref 26.0–34.0)
MCHC: 32 g/dL (ref 30.0–36.0)
MCV: 100 fL (ref 80.0–100.0)
Monocytes Absolute: 0.5 10*3/uL (ref 0.1–1.0)
Monocytes Relative: 9 %
NEUTROS ABS: 3.1 10*3/uL (ref 1.7–7.7)
Neutrophils Relative %: 51 %
Platelet Count: 164 10*3/uL (ref 150–400)
RBC: 3.81 MIL/uL — ABNORMAL LOW (ref 3.87–5.11)
RDW: 16.8 % — ABNORMAL HIGH (ref 11.5–15.5)
WBC Count: 6 10*3/uL (ref 4.0–10.5)
nRBC: 0 % (ref 0.0–0.2)

## 2018-04-03 LAB — CMP (CANCER CENTER ONLY)
ALK PHOS: 64 U/L (ref 38–126)
ALT: 10 U/L (ref 0–44)
AST: 20 U/L (ref 15–41)
Albumin: 3.9 g/dL (ref 3.5–5.0)
Anion gap: 9 (ref 5–15)
BILIRUBIN TOTAL: 0.4 mg/dL (ref 0.3–1.2)
BUN: 14 mg/dL (ref 8–23)
CO2: 29 mmol/L (ref 22–32)
Calcium: 9.7 mg/dL (ref 8.9–10.3)
Chloride: 105 mmol/L (ref 98–111)
Creatinine: 1.03 mg/dL — ABNORMAL HIGH (ref 0.44–1.00)
GFR, Est AFR Am: >60 mL/min (ref >60)
GFR, Estimated: 56 mL/min — ABNORMAL LOW (ref >60)
Glucose, Bld: 105 mg/dL — ABNORMAL HIGH (ref 70–99)
Potassium: 4 mmol/L (ref 3.5–5.1)
SODIUM: 143 mmol/L (ref 135–145)
TOTAL PROTEIN: 7.6 g/dL (ref 6.5–8.1)

## 2018-04-03 NOTE — Progress Notes (Addendum)
Progress Note : GYN-ONC Established Patient FOLLOW-UP   Originally consult was requested by Dr. Evie Lacks for an adnexal mass and elevated CA125; ultimately diagnosed with ovarian cancer.  CC:  Chief Complaint  Patient presents with  . Right ovarian epithelial cancer [facility])   Oncologic Summary 1. 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  is a very nice 67 y.o.  P2   Interval History:  She returns for continued mutual management and followup. She is doing well on the PARP inhibitor. No nausea. States no dose reduction yet. Has followup with Dr. Alvy Bimler Jan 2020 and scan planned for Feb 2020.   Presenting History:  She presented to her gynecologist for her annual exam and noted symptoms of bloating along with decreased appetite however increased weight.  On physical exam her gynecologist noted a palpable mass and imaging was ordered including ultrasound.  Ultrasound revealed a complex cystic mass in the right adnexa measuring 20 x 11 x 12 cm with multiple internal septations some of which are thick.  The left adnexa measured 12.7 x 11.6 x 8.1 with low level echoes and soft tissue nodules.  This was 07/30/2017.  The same day she had a Ca125 equal to 521.3.  At some point a provider ordered an MRI of the abdomen and pelvis with and without contrast the lower chest revealed several prominent lower anterior mediastinal lymph nodes including a pericardial measuring 12 mm in short axis.  Hepatic steatosis.  Surface of the liver is noted to have several soft tissue areas of thickening and enhancement suspicious for serosal implants.  Peritoneal disease is expected in the upper abdomen adjacent to the liver and transverse colon.  Extensive omental and  peritoneal implants are appreciated in the left lower quadrant.  She does have an abdominal MRI in 2018 for pancreatic cyst no abdominal pelvic mass or peritoneal disease was noted at that time.  Looking back she thinks that over the 6 months prior to presentation she noted she had just not been feeling her usual self with decreased energy and occasional discomfort in the abdomen.  Again she notes a weight gain despite a decreased appetite.  She describes her pain as being vaginal and abdominal 5 out of 10 sharp to dull it is brief but aggravated by sitting she alleviates it with anti-inflammatories.  She does also note bloating increased symptoms of reflux and urinary urgency.  Her bowel movements do not seem to be affected she is having daily bowel movements and denies persistent nausea and vomiting.  She has had some nausea which occurs about twice per week.  On a side note she was a widow and recently married a new gentleman.  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 There is mucinous differentiation. In addition a PET was performed to look at the mediastinal/cardiophrenic lymph node.  Unfortunately that did show FDG uptake and appears to be consistent with metastatic disease which would make her unresectable in the upfront setting.  She started chemo 09/05/17 with Carbo/Taxol Ca125 decreased  . 07/30/2017-Ca125-  521.3 performed at [place] . 08/31/17 = 819.9 (C1) . 09/26/17 = not done? (C2) . 10/17/17 = 374.4 (C3) Imaging postcycle 3 showed response in the paracardiac adenopathy from 27m --> 942m However all  other regions showed overall stable disease. There was no progression noted. -- There is a tubular filling defect extending superiorly into the right internal jugular vein (axial images 2 through 7/2), not typical for inflow artifact. This could reflect nonocclusive thrombus. The left internal jugular vein and SVC appear normal. Aberrant right  subclavian artery noted. -- The previously demonstrated bilateral paracardiac nodes have improved. Node at the right cardiophrenic angle measures 8 mm short axis on image 43/2 (previously 19 mm). Left pericardiac node measures 9 mm on image 48/2 (previously 13 mm). Additional small mediastinal and axillary lymph nodes bilaterally are stable. There is mild distal esophageal wall thickening. The trachea appears unremarkable. Lungs/Pleura: There is no pleural effusion. Mild dependent atelectasis at both lung bases. No suspicious pulmonary nodules. Musculoskeletal/Chest wall: No chest wall mass or suspicious osseous findings. CT ABDOMEN AND PELVIS FINDINGS Hepatobiliary: Hepatic steatosis as before. No focal hepatic lesion or abnormal enhancement. No significant biliary dilatation status post cholecystectomy. Pancreas: Unremarkable. No pancreatic ductal dilatation or surrounding inflammatory changes. Spleen: Normal in size without focal abnormality. Adrenals/Urinary Tract: Both adrenal glands appear normal. There is a stable cyst in the interpolar region of the right kidney. No evidence of renal mass, urinary tract calculus or hydronephrosis. No significant bladder findings. Stomach/Bowel: No evidence of bowel wall thickening, distention or surrounding inflammatory change. There is peripheral displacement of the bowel by the large bilateral adnexal lesions. Vascular/Lymphatic: There are no enlarged abdominal or pelvic lymph nodes. There are stable small inguinal lymph nodes bilaterally mild aortic and branch vessel atherosclerosis. There is flattening of the IVC without evidence of thrombosis. Reproductive: Previous hysterectomy. Large complex bilateral adnexal masses are again noted. The right adnexal lesion measures up to 21.5 x 13.6 cm transverse (image 77/2) and extends 24.7 cm cephalocaudad. The irregular solid components along the inferolateral aspect of this lesion have not significantly changed. Left adnexal  lesion measures 13.0 x 7.3 x 13.6 cm and demonstrates increased density within a dependent component which measures 2.2 x 2.5 cm transverse on image 102/2. Other: Omental nodularity is similar to the previous studies, hypermetabolic on PET-CT. Largest component measures up to 5.3 x 1.8 cm on image 99/2. No significant associated ascites. Musculoskeletal: No acute or significant osseous findings. IMPRESSION: 1. No significant change in size of the large complex bilateral adnexal masses consistent with the known history of ovarian cancer. There is increased dependent density within the left-sided lesion. 2. Stable peritoneal carcinomatosis.  No significant ascites. 3. The bilateral pericardiac adenopathy has improved. No progressive thoracic metastatic disease. No pulmonary or osseous metastatic disease. 4. Suspicion of nonocclusive thrombus in the right internal jugular vein related to the right IJ port. Recommend further evaluation with Doppler ultrasound.   Therefore 11/13/17 she was taken for interval debulking -- Exploratory laparotomy,bilateral salpingo-oophorectomy with radical tumor debulking for ovarian cancer including retroperitoneal dissection, lysis of adhesions (enterolysis of small bowel in deep pelvis, left adnexal adhesions to sigmoid and omentum to LLQ) ~1 hour, ureterolysis. Omentectomy; Takedown of splenic flexure with removal of omental tumor in left upper quadrant. Findings included; Upon entry a large ~20cm right tube/ovary, mostly cystic. Adherent rind along ileocecal region and appendix. Large cystic left tube/ovary ~10cm with sigmoid colon stretched across the mass and extension of the cystic lesion into the deep pelvis with residual palpable disease deep pelvis near levators. Estimate ~1cm or less on palpation, not visible disease. Omental caking in the LLQ adherent to pelvic sidewall. Omental disease noted RUQ separate. In addition ~1.5cm lesion  in splenic flexure omentum. Evidence of  prior diaphragmatic disease, no visible disease. This represented an optimal cytoreduction R1 with gross residual disease remaining in the deep pelvis near the levator floor and suspect in the region of the right IP ligament/periappendiceal region.   Final pathology of all specimen revealed ; INVASIVE MUCINOUS ADENOCARCINOMA. Overall G2 (moderately differentiated) (focal areas of poor differentiation are present).No definite or minimal response identified (chemotherapy response score 1 [CRS 1]  Dr. Alvy Bimler and I agreed on a change of chemo to a GI regimen, given the lack of meaningful response in the volume and pathologic assessment of the tumor at time of interval debulking along with disease progression even since surgery.   She was found to have tumor profiling with HRD (BRCA) mutational status.     Measurement of disease:  Recent Labs    08/31/17 1118 10/17/17 0751 12/10/17 1128 01/09/18 0840 02/06/18 0824 03/13/18 1030  CAN125 819.9* 374.4* 94.9* 37.0 36.7 33.8    . 07/30/2017-Ca125-  521.3 performed at [facility] . 08/31/17 = 819.9 (C1) . 09/26/17 = not done (C2) . 10/17/17 = 374.4 . 12/10/17 = 94.9 . 01/09/18 = 37 . 02/06/18 = 36.7 . 03/13/18 = 33.8 (PARP initiation)      CEA and CA19-9 ordered 08/22/17 given the "mucinous" differentiation in the biopsy report -both normal   Radiology:   08/02/2017-MRI of the abdomen and pelvis with and without contrast -performed in [place], [place] -  the lower chest revealed several prominent lower anterior mediastinal lymph nodes including a pericardial measuring 12 mm in short axis.  Hepatic steatosis.  Surface of the liver is noted to have several soft tissue areas of thickening and enhancement suspicious for serosal implants.  Peritoneal disease is expected in the upper abdomen adjacent to the liver and transverse colon.  Extensive omental and peritoneal implants are appreciated in the left lower quadrant.  08/20/2017 chest CT-[facility]-as noted in the oncologic history  08/20/2017-PET scan-[facility]-as noted in the oncologic history 10/31/2017 - CT CAP - [facility] CLINICAL DATA:  Ovarian cancer diagnosed in April 2019. Chemotherapy in progress. No current complaints. EXAM: CT CHEST, ABDOMEN, AND PELVIS WITH CONTRAST TECHNIQUE: Multidetector CT imaging of the chest, abdomen and pelvis was performed following the standard protocol during bolus administration of intravenous contrast. CONTRAST:  133m ISOVUE-300 IOPAMIDOL (ISOVUE-300) INJECTION 61% COMPARISON:  Abdominopelvic MRI 08/02/2017, PET-CT 08/21/1998 chest CT 08/20/2017. FINDINGS: CT CHEST FINDINGS Cardiovascular: Interval placement of a right IJ Port-A-Cath extends to the lower SVC. There is a tubular filling defect extending superiorly into the right internal jugular vein (axial images 2 through 7/2), not typical for inflow artifact. This could reflect nonocclusive thrombus. The left internal jugular vein and SVC appear normal. Aberrant right subclavian artery noted. The heart size is normal. There is no pericardial effusion. Mediastinum/Nodes: The previously demonstrated bilateral paracardiac nodes have improved. Node at the right cardiophrenic angle measures 8 mm short axis on image 43/2 (previously 19 mm). Left pericardiac node measures 9 mm on image 48/2 (previously 13 mm). Additional small mediastinal and axillary lymph nodes bilaterally are stable. There is mild distal esophageal wall thickening. The trachea appears unremarkable. Lungs/Pleura: There is no pleural effusion. Mild dependent atelectasis at both lung bases. No suspicious pulmonary nodules. Musculoskeletal/Chest wall: No chest wall mass or suspicious osseous findings. CT ABDOMEN AND PELVIS FINDINGS Hepatobiliary: Hepatic steatosis as before. No focal hepatic lesion or abnormal enhancement. No significant biliary dilatation status post cholecystectomy. Pancreas: Unremarkable. No pancreatic ductal dilatation  or  surrounding inflammatory changes. Spleen: Normal in size without focal abnormality. Adrenals/Urinary Tract: Both adrenal glands appear normal. There is a stable cyst in the interpolar region of the right kidney. No evidence of renal mass, urinary tract calculus or hydronephrosis. No significant bladder findings. Stomach/Bowel: No evidence of bowel wall thickening, distention or surrounding inflammatory change. There is peripheral displacement of the bowel by the large bilateral adnexal lesions. Vascular/Lymphatic: There are no enlarged abdominal or pelvic lymph nodes. There are stable small inguinal lymph nodes bilaterally mild aortic and branch vessel atherosclerosis. There is flattening of the IVC without evidence of thrombosis. Reproductive: Previous hysterectomy. Large complex bilateral adnexal masses are again noted. The right adnexal lesion measures up to 21.5 x 13.6 cm transverse (image 77/2) and extends 24.7 cm cephalocaudad. The irregular solid components along the inferolateral aspect of this lesion have not significantly changed. Left adnexal lesion measures 13.0 x 7.3 x 13.6 cm and demonstrates increased density within a dependent component which measures 2.2 x 2.5 cm transverse on image 102/2. Other: Omental nodularity is similar to the previous studies, hypermetabolic on PET-CT. Largest component measures up to 5.3 x 1.8 cm on image 99/2. No significant associated ascites. Musculoskeletal: No acute or significant osseous findings. IMPRESSION: 1. No significant change in size of the large complex bilateral adnexal masses consistent with the known history of ovarian cancer. There is increased dependent density within the left-sided lesion. 2. Stable peritoneal carcinomatosis.  No significant ascites. 3. The bilateral pericardiac adenopathy has improved. No progressive thoracic metastatic disease. No pulmonary or osseous metastatic disease. 4. Suspicion of nonocclusive thrombus in the right internal  jugular vein related to the right IJ port. Recommend further evaluation with Doppler ultrasound. 5. These results will be called to the ordering clinician or representative by the Radiologist Assistant, and communication documented in the PACS or zVision Dashboard. Electronically Signed   By: Richardean Sale M.D.  Postop imaging (CT -- IMPRESSION: 1. Interval resection of large bilateral adnexal cystic masses. Residual soft tissue/tumor within bilateral adnexal regions as above. 2. Interval resolution of previously noted omental cake which may reflect interval omentectomy. There is a new peritoneal deposit identified within the left upper quadrant of the abdomen involving the anterior surface of the spleen) Ct Abdomen Pelvis W Contrast  Result Date: 01/31/2018 CLINICAL DATA:  Follow-up right ovarian epithelial carcinoma. Status post surgery and chemotherapy. EXAM: CT ABDOMEN AND PELVIS WITH CONTRAST TECHNIQUE: Multidetector CT imaging of the abdomen and pelvis was performed using the standard protocol following bolus administration of intravenous contrast. CONTRAST:  133m OMNIPAQUE IOHEXOL 300 MG/ML  SOLN COMPARISON:  12/10/2017 FINDINGS: Lower Chest: No acute findings. Hepatobiliary: No hepatic masses identified. Mild hepatic steatosis. Prior cholecystectomy. No evidence of biliary obstruction. Pancreas:  No mass or inflammatory changes. Spleen: Within normal limits in size and appearance. Previously seen peritoneal soft tissue density along the anterior margin the spleen has resolved since previous study. Adrenals/Urinary Tract: No masses identified. Small right renal cysts again noted. No evidence of hydronephrosis. Normal appearance of renal collecting systems, ureters, and bladder. Stomach/Bowel: No evidence of obstruction, inflammatory process or abnormal fluid collections. Sigmoid diverticulosis. No radiographic evidence of diverticulitis. Previously seen small pericolonic abscess along the left lateral  pelvic wall has resolved since prior study. Vascular/Lymphatic: No evidence of abdominal aortic aneurysm. Aortic atherosclerosis. No pathologically enlarged lymph nodes identified. Sub-cm lymph nodes again seen in both cardiophrenic angles which remain stable. Reproductive: Prior hysterectomy noted. Adnexal regions are unremarkable in appearance. Other:  None. Musculoskeletal:  No suspicious bone lesions identified. IMPRESSION: Resolution of peritoneal soft tissue density along the anterior margin of the spleen since prior study. No evidence of residual or progressive metastatic disease. Mild sigmoid diverticulosis, without radiographic evidence of diverticulitis. Resolution of small pericolonic abscess along the left lateral pelvic sidewall. Mild hepatic steatosis. Electronically Signed   By: Earle Gell M.D.   On: 01/31/2018 14:50      Oncologic History:    Oncology History   ER 15-20%, PR 5-10%, Her2/neu negative MMR normal Negative genetic testing HRD pos BRCA1 positive MSI Stable     Right ovarian epithelial cancer ([facility])   07/30/2017 Imaging    US pelvis Ultrasound revealed a complex cystic mass in the right adnexa measuring 20 x 11 x 12 cm with multiple internal septations some of which are thick. The left adnexa measured 12.7 x 11.6 x 8.1 with low level echoes and soft tissue nodules     07/30/2017 Tumor Marker    Patient's tumor was tested for the following markers: CA-125 Results of the tumor marker test revealed 521.3    08/17/2017 Pathology Results    The malignant cells are positive for PAX-8, cytokeratin 7, estrogen receptor, and faintly positive for GATA-3. They are negative for p53, GCDFP, and cytokeratin 20. The finding are consistent with a gynecologic primary carcinoma. Additional studies can be performed upon clinician request.    08/17/2017 Procedure    Technically successful CT-guided left lower quadrant omental mass core biopsy.    08/20/2017 PET scan    1. Cystic  masses arising from the pelvis. The nodular component of the RIGHT cystic mass is intensely hypermetabolic consistent with malignant ovarian neoplasm. 2. Extensive hypermetabolic peritoneal thickening in the lower abdomen and upper pelvis, upper abdomen, and upper abdominal precordial fat and paradiaphragmatic fat. 3. Retroperitoneal nodal metastasis adjacent to the IVC at the level the kidneys. 4. No evidence of metastatic disease in the thorax other small effusion on the LEFT and nodal metastasis in the fat superior to the diaphragm. 5. Mild metabolic activity associated the distal esophagus is favored benign esophagitis.    08/20/2017 Imaging    CT chest 1. Bilateral cardiophrenic angle nodal metastasis. No additional findings to suggest metastatic disease to the chest. 2. Small left pleural effusion. 3. Peritoneal carcinomatosis noted within the abdomen. 4. Hepatic steatosis.     08/24/2017 Cancer Staging    Staging form: Ovary, Fallopian Tube, and Primary Peritoneal Carcinoma, AJCC 8th Edition - Clinical: Stage IV (cT3, cN1, cM1) - Signed by [name], MD on 08/24/2017    08/31/2017 Tumor Marker    Patient's tumor was tested for the following markers: CA-125 Results of the tumor marker test revealed 819.9    09/26/2017 Adverse Reaction    She developed reaction to Taxol, managed successfully with additional premedications    10/17/2017 Tumor Marker    Patient's tumor was tested for the following markers: CA-125 Results of the tumor marker test revealed 374.4    10/31/2017 Imaging    1. No significant change in size of the large complex bilateral adnexal masses consistent with the known history of ovarian cancer. There is increased dependent density within the left-sided lesion. 2. Stable peritoneal carcinomatosis.  No significant ascites.  3. The bilateral pericardiac adenopathy has improved. No progressive thoracic metastatic disease. No pulmonary or osseous metastatic disease. 4.  Suspicion of nonocclusive thrombus in the right internal jugular vein related to the right IJ port. Recommend further evaluation with Doppler ultrasound.  11/13/2017 Surgery    Preoperative Diagnosis: Ovarian cancer s/p neoadjuvant chemotherapy   Procedure(s) Performed:   4. Exploratory laparotomy  5. Bilateral salpingo-oophorectomy with radical tumor debulking for ovarian cancer   including retroperitoneal dissection, lysis of adhesions (enterolysis of small bowel in deep pelvis, left adnexal adhesions to sigmoid and omentum to LLQ) ~1 hour, ureterolysis. 3. Omentectomy  4. Takedown of splenic flexure with removal of omental tumor in left upper quadrant  Specimens: Bilateral tubes and ovaries, omentum, splenic flexure nodule, right paracolic gutter nodule, cecal nodule, right ureteral peritoneal nodule, small bowel mesentary.  Indication for Procedure:  Patient is s/p 3 cycles of chemotherapy with response based on CA125 and decreased mediastinal disease (to <1cm).  Operative Findings:  This represented an optimal cytoreduction with gross residual disease remaining in the deep pelvis near the levator floor and possibly in the region of the right IP ligament/periappendicial region.  Upon entry a large ~20cm right tube/ovary, mostly cystic. Adherent rind along ileocecal region and appendix. Large cystic left tube/ovary ~10cm with sigmoid colon stretched across the mass and extension of the cystic lesion into the deep pelvis with residual palpable disease deep pelvis near levators. Estimate ~1cm or less on palpation, not visible disease. Omental caking in the LLQ adherent to pelvic sidewall. Omental disease noted RUQ separate. In addition ~1.5cm lesion in splenic flexure omentum. Evidence of prior diaphragmatic disease, no visible disease.       11/13/2017 Pathology Results    1. 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 - TUMOR  INVOLVES THE OVARIAN SURFACE. - RIGHT FALLOPIAN TUBE IS INVOLVED. - SEE ONCOLOGY TABLE. - SEE NOTE. 2. Soft tissue mass, biopsy, cecal gutter nodule - METASTATIC MUCINOUS ADENOCARCINOMA. 3. Ovary and fallopian tube, left, left ovary and fallopian tube - METASTATIC MUCINOUS ADENOCARCINOMA TO LEFT OVARY AND FALLOPIAN TUBE. 4. Omentum, resection for tumor - METASTATIC MUCINOUS ADENOCARCINOMA TO OMENTUM. 5. Soft tissue mass, biopsy, splenic flexure nodule - METASTATIC MUCINOUS ADENOCARCINOMA. 6. Soft tissue mass, biopsy, cecal implant - METASTATIC MUCINOUS ADENOCARCINOMA. 7. Soft tissue mass, biopsy, right ureteral peritoneal implant - METASTATIC MUCINOUS ADENOCARCINOMA. 8. Mesentery, small bowel nodule - METASTATIC MUCINOUS ADENOCARCINOMA. Microscopic Comment 1. OVARY or FALLOPIAN TUBE or PRIMARY PERITONEUM: Procedure: Bilateral salpingo-oophorectomy. Specimen Integrity: Intact. Tumor Site: Right ovary. Ovarian Surface Involvement: Present. Fallopian Tube Surface Involvement: Present. Tumor Size: 20 cm. Histologic Type: Mucinous adenocarcinoma. Histologic Grade: Overall G2 (moderately differentiated) (focal areas of poor differentiation are present). Implants: Present. Other Tissue/ Organ Involvement: Left ovary, left fallopian tube and omentum. Largest Extrapelvic Peritoneal Focus: less than 2 cm. See note Peritoneal/Ascitic Fluid: N/A Treatment Effect: No definite or minimal response identified (chemotherapy response score 1 [CRS 1]) Regional Lymph Nodes No lymph nodes submitted or found Number of Lymph Nodes Examined: 0 Pathologic Stage Classification (pTNM, AJCC 8th Edition): ypT3b, ypNX. Representative Tumor Block: 1D and 1E. (NDK:gt, 11/15/17)    11/19/2017 Genetic Testing    Negative genetic testing on the [employer] Myrisk panel.  The [facility] gene panel offered by [employer] includes sequencing and deletion/duplication testing of the following 35 genes: APC,  ATM, AXIN2, BARD1, BMPR1A, BRCA1, BRCA2, BRIP1, CHD1, CDK4, CDKN2A, CHEK2, EPCAM (large rearrangement only), HOXB13, (sequencing only), GALNT12, MLH1, MSH2, MSH3 (excluding repetitive portions of exon 1), MSH6, MUTYH, NBN, NTHL1, PALB2, PMS2, PTEN, RAD51C, RAD51D, RNF43, RPS20, SMAD4, STK11, and TP53. Sequencing was performed for select regions of POLE and POLD1, and large rearrangement analysis was performed for select  regions of GREM1. The report date is November 19, 2017.  HRD tumor results indicate a BRCA1 mutation identified in the ovarian tumor causing genomic instability. The report date is 12/04/2017      Genetic Testing    Patient has genetic testing done for ER/PR and Her2/neu. Results revealed patient has the following: ER 15-20% PR 5-10% Her2/neu - negative     Genetic Testing    Patient has genetic testing done for MSI/MMR. Results revealed patient has the following mutation(s): MMR normal, MSI stable    12/10/2017 Imaging    1. Interval resection of large bilateral adnexal cystic masses. Residual soft tissue/tumor within bilateral adnexal regions as above. 2. Interval resolution of previously noted omental cake which may reflect interval omentectomy. There is a new peritoneal deposit identified within the left upper quadrant of the abdomen involving the anterior surface of the spleen.    12/10/2017 Tumor Marker    Patient's tumor was tested for the following markers: CA-125 Results of the tumor marker test revealed 94.9    12/11/2017 - 02/20/2018 Chemotherapy    The patient had FOLFOX regimen for mucinous    01/09/2018 Tumor Marker    Patient's tumor was tested for the following markers: CA-125 Results of the tumor marker test revealed 37    01/31/2018 Imaging    Resolution of peritoneal soft tissue density along the anterior margin of the spleen since prior study. No evidence of residual or progressive metastatic disease.  Mild sigmoid diverticulosis, without radiographic  evidence of diverticulitis. Resolution of small pericolonic abscess along the left lateral pelvic sidewall.  Mild hepatic steatosis.    02/06/2018 Tumor Marker    Patient's tumor was tested for the following markers: CA-125 Results of the tumor marker test revealed 36.7    03/06/2018 -  Chemotherapy    The patient is started on [name] as PARP maintenance    03/13/2018 Tumor Marker    Patient's tumor was tested for the following markers: CA-125 Results of the tumor marker test revealed 33.8     Current Meds:  Outpatient Encounter Medications as of 04/03/2018  Medication Sig  . atenolol (TENORMIN) 25 MG tablet Take 25 mg by mouth at bedtime.   Marland Kitchen CALCIUM-MAGNESIUM-VITAMIN D PO Take 1 tablet by mouth daily.  . famotidine (PEPCID) 20 MG tablet Take 20 mg by mouth as needed for heartburn or indigestion.  . lidocaine-prilocaine (EMLA) cream Apply 1 application topically as needed.  . loratadine (CLARITIN) 10 MG tablet Take 10 mg by mouth daily as needed for allergies.  . metFORMIN (GLUCOPHAGE) 1000 MG tablet Take 500 mg by mouth 2 (two) times daily.   . Multiple Vitamin (MULTIVITAMIN WITH MINERALS) TABS tablet Take 1 tablet by mouth daily.  Marland Kitchen olaparib ([name]) 100 MG tablet Take 2 tablets (200 mg total) by mouth 2 (two) times daily. Swallow whole.  Marland Kitchen acetaminophen (TYLENOL) 500 MG tablet Take 500 mg by mouth every 8 (eight) hours as needed for mild pain or moderate pain.  Marland Kitchen ondansetron (ZOFRAN) 8 MG tablet Take 1 tablet (8 mg total) by mouth every 8 (eight) hours as needed for nausea. (Patient not taking: Reported on 04/03/2018)  . prochlorperazine (COMPAZINE) 10 MG tablet Take 1 tablet (10 mg total) by mouth every 6 (six) hours as needed for nausea or vomiting. (Patient not taking: Reported on 04/03/2018)   No facility-administered encounter medications on file as of 04/03/2018.     Allergy: No Known Allergies  Social Hx:   Tobacco : never  Alcohol: 5 EtOH per week Illicit Drug  use: none  Past Surgical Hx:  Past Surgical History:  Procedure Laterality Date  . CHOLECYSTECTOMY N/A 07/06/2014   Procedure: LAPAROSCOPIC CHOLECYSTECTOMY;  Surgeon: Aviva Signs Md, MD;  Location: [facility];  Service: General;  Laterality: N/A;  . COLONOSCOPY N/A 06/26/2013   Procedure: COLONOSCOPY;  Surgeon: Rogene Houston, MD;  Location: [facility];  Service: Endoscopy;  Laterality: N/A;  1030  . DEBULKING N/A 11/13/2017   Procedure: DEBULKING;  Surgeon: Isabel Caprice, MD;  Location: [facility];  Service: Gynecology;  Laterality: N/A;  . IR FLUORO GUIDE PORT INSERTION RIGHT  09/03/2017  . IR US GUIDE VASC ACCESS RIGHT  09/03/2017  . LAPAROTOMY N/A 11/13/2017   Procedure: EXPLORATORY LAPAROTOMY;  Surgeon: Isabel Caprice, MD;  Location: [facility];  Service: Gynecology;  Laterality: N/A;  . SALPINGOOPHORECTOMY Bilateral 11/13/2017   Procedure: BILATERAL SALPINGO OOPHORECTOMY;  Surgeon: Isabel Caprice, MD;  Location: [facility];  Service: Gynecology;  Laterality: Bilateral;  . VAGINAL HYSTERECTOMY  2011    Past Medical Hx:  Past Medical History:  Diagnosis Date  . Diabetes mellitus without complication ([place])    TYPE 2  . Diverticulosis   . Family history of cancer   . Hypertension   . Osteopenia   . Ovarian cancer ([place])   . Pancreatic cyst     Past Gynecological History:   GYNECOLOGIC HISTORY:  No LMP recorded. Patient has had a hysterectomy. Menarche: 67 years old P 50 LMP 67 years old Contraceptive; yes, <10 years HRT <1 year  Last Pap s/p vaginal hysterectomy 2011 for "prolapse"  Family Hx:  Family History  Problem Relation Age of Onset  . Colon cancer Mother 59       colon ca  . Skin cancer Father 92       unsure type  . Heart attack Father 61       d. 32  . Stroke Maternal Grandmother   . Brain cancer Maternal Grandfather 82  . Dementia Maternal Aunt   . Diabetes Maternal Aunt   . Skin cancer Maternal Uncle   . Heart attack Paternal Aunt   . Fibromyalgia Sister   .  Diabetes Sister   . Cancer Other 101       possible ovarian cancer, d. 102  . Prostate cancer Cousin        pat first cousin    Review of Systems:  Review of Systems  Constitutional: Negative.   HENT:  Negative.   Eyes: Negative.   Respiratory: Negative.   Cardiovascular: Negative.   Gastrointestinal: Negative.   Endocrine: Negative.   Genitourinary: Negative.    Musculoskeletal: Negative.   Skin: Negative.   Neurological: Negative.   Hematological: Negative.   Psychiatric/Behavioral: Negative.      Vitals:  Blood pressure 130/61, pulse 69, temperature 97.9 F (36.6 C), temperature source Oral, resp. rate 20, height 5' 5"  (1.651 m), weight 64.6 kg, SpO2 100 %. Body mass index is 23.7 kg/m.   Physical Exam: ECOG PERFORMANCE STATUS: 1 - Symptomatic but completely ambulatory  General :  Well developed, 67 y.o., female in no apparent distress HEENT:  Normocephalic/atraumatic, symmetric, EOMI, eyelids normal Neck:   No visible masses. No palpable adenopathy Respiratory:  Respirations unlabored, no use of accessory muscles CV:   Deferred Breast:  Deferred Musculoskeletal: Normal muscle strength.   Abdomen:  Soft. Nontender, nondistended. No masses Extremities:  No erythema , no pain , no edema Skin:  Normal inspection.  No rash Neuro/Psych:  No focal motor deficit, no abnormal mental status. Normal gait. Normal affect. Alert and oriented to person, place, and time  Genito Urinary  03/2018 - same exam as 12/28/17 still question of stool versus other at distal portion of my exam. 12/28/17 Vulva: Normal external female genitalia.  Bladder/urethra: Urethral meatus normal in size and location. No lesions or   masses, well supported bladder Bimanual exam: Cervix/Uterus/Adnexa: Surgically absent  No masses Rectovaginal:  Good tone, no cul de sac nodularity, no parametrial involvement or nodularity. At the tip of my rectal digit I feel a nodule but this is higher than I expected  for the disease that was felt residual in the culdesac. This could be stool in a separate bowel loop. 08/03/17: Bimanual exam: palpable fullness but no specific nodularity Rectovaginal:  Good tone, fullness in the cul-de-sac but no cul de sac nodularity, no parametrial involvement or nodularity.  The rectal wall feels smooth but there is some mild compression anteriorly from the mass in the pelvis which is mildly mobile.  This compression starts at approximately 7-8 cm in from the anal verge.  Oncologic Summary: 1. Ovarian adenocarcinoma  2. Cardiophrenic lymphadenopathy    Assessment/Plan: 1. Management o Continue PARP o Imaging per Dr. Alvy Bimler o Recommend continue to follow CA125  2. Followup o Continue RTC / pelvic exam every 3 months   Face to face time with patient was 15 minutes. Over 50% of this time was spent on counseling and coordination of care.   Isabel Caprice, MD  04/03/2018, 1:51 PM  Cc: Sinda Du, Linna Hoff, [place] (PCP) Gari Crown, MD (Referring OB/Gyn) [name], MD (Medical Oncology)

## 2018-04-03 NOTE — Patient Instructions (Signed)
Return in 3 months for a pelvic

## 2018-04-08 ENCOUNTER — Telehealth: Payer: Self-pay

## 2018-04-08 LAB — CA 125: Cancer Antigen (CA) 125: 27.4 U/mL (ref 0.0–38.1)

## 2018-04-08 NOTE — Telephone Encounter (Signed)
Called and left below message. Ask her to call the office for questions. ?

## 2018-04-08 NOTE — Telephone Encounter (Signed)
-----   Message from [name], MD sent at 04/08/2018 12:44 PM EST ----- Regarding: labs are good Pls let her know all labs are good and CA-125 continues to trend down Continue same treatment ----- Message ----- From: Buel Ream, Lab In [place] Sent: 04/03/2018  11:09 AM EST To: [name], MD

## 2018-04-22 MED FILL — LYNPARZA 100 MG TAB: 100 | 30 days supply | Qty: 120 | Fill #2

## 2018-05-03 ENCOUNTER — Telehealth: Payer: Self-pay | Admitting: Hematology and Oncology

## 2018-05-03 ENCOUNTER — Inpatient Hospital Stay: Payer: Medicare HMO | Attending: Obstetrics | Admitting: Hematology and Oncology

## 2018-05-03 ENCOUNTER — Inpatient Hospital Stay: Payer: Medicare HMO

## 2018-05-03 ENCOUNTER — Encounter: Payer: Self-pay | Admitting: Hematology and Oncology

## 2018-05-03 DIAGNOSIS — C561 Malignant neoplasm of right ovary: Secondary | ICD-10-CM | POA: Diagnosis not present

## 2018-05-03 DIAGNOSIS — E119 Type 2 diabetes mellitus without complications: Secondary | ICD-10-CM | POA: Insufficient documentation

## 2018-05-03 DIAGNOSIS — Z452 Encounter for adjustment and management of vascular access device: Secondary | ICD-10-CM | POA: Diagnosis not present

## 2018-05-03 DIAGNOSIS — D6481 Anemia due to antineoplastic chemotherapy: Secondary | ICD-10-CM | POA: Insufficient documentation

## 2018-05-03 DIAGNOSIS — R590 Localized enlarged lymph nodes: Secondary | ICD-10-CM

## 2018-05-03 DIAGNOSIS — T451X5A Adverse effect of antineoplastic and immunosuppressive drugs, initial encounter: Secondary | ICD-10-CM | POA: Insufficient documentation

## 2018-05-03 LAB — CMP (CANCER CENTER ONLY)
ALT: 10 U/L (ref 0–44)
AST: 16 U/L (ref 15–41)
Albumin: 3.7 g/dL (ref 3.5–5.0)
Alkaline Phosphatase: 66 U/L (ref 38–126)
Anion gap: 10 (ref 5–15)
BILIRUBIN TOTAL: 0.4 mg/dL (ref 0.3–1.2)
BUN: 15 mg/dL (ref 8–23)
CO2: 27 mmol/L (ref 22–32)
Calcium: 9.6 mg/dL (ref 8.9–10.3)
Chloride: 104 mmol/L (ref 98–111)
Creatinine: 0.96 mg/dL (ref 0.44–1.00)
GFR, Est AFR Am: >60 mL/min (ref >60)
GFR, Estimated: >60 mL/min (ref >60)
Glucose, Bld: 108 mg/dL — ABNORMAL HIGH (ref 70–99)
Potassium: 3.9 mmol/L (ref 3.5–5.1)
Sodium: 141 mmol/L (ref 135–145)
Total Protein: 7.3 g/dL (ref 6.5–8.1)

## 2018-05-03 LAB — CBC WITH DIFFERENTIAL (CANCER CENTER ONLY)
Abs Immature Granulocytes: 0.01 10*3/uL (ref 0.00–0.07)
Basophils Absolute: 0.1 10*3/uL (ref 0.0–0.1)
Basophils Relative: 1 %
Eosinophils Absolute: 0.2 10*3/uL (ref 0.0–0.5)
Eosinophils Relative: 3 %
HCT: 36.4 % (ref 36.0–46.0)
Hemoglobin: 11.9 g/dL — ABNORMAL LOW (ref 12.0–15.0)
Immature Granulocytes: 0 %
Lymphocytes Relative: 24 %
Lymphs Abs: 1.5 10*3/uL (ref 0.7–4.0)
MCH: 33.4 pg (ref 26.0–34.0)
MCHC: 32.7 g/dL (ref 30.0–36.0)
MCV: 102.2 fL — ABNORMAL HIGH (ref 80.0–100.0)
Monocytes Absolute: 0.5 10*3/uL (ref 0.1–1.0)
Monocytes Relative: 8 %
NRBC: 0 % (ref 0.0–0.2)
Neutro Abs: 4 10*3/uL (ref 1.7–7.7)
Neutrophils Relative %: 64 %
Platelet Count: 181 10*3/uL (ref 150–400)
RBC: 3.56 MIL/uL — ABNORMAL LOW (ref 3.87–5.11)
RDW: 15.9 % — ABNORMAL HIGH (ref 11.5–15.5)
WBC Count: 6.2 10*3/uL (ref 4.0–10.5)

## 2018-05-03 MED ORDER — SODIUM CHLORIDE 0.9% FLUSH
10.0000 mL | Freq: Once | INTRAVENOUS | Status: AC
  Administered 2018-05-03: 10 mL
  Filled 2018-05-03: qty 10

## 2018-05-03 MED ORDER — HEPARIN SOD (PORK) LOCK FLUSH 100 UNIT/ML IV SOLN
500.0000 [IU] | Freq: Once | INTRAVENOUS | Status: AC
  Administered 2018-05-03: 500 [IU]
  Filled 2018-05-03: qty 5

## 2018-05-03 NOTE — Progress Notes (Signed)
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  She is comfortable to wait until October for repeat imaging study  Anemia due to antineoplastic chemotherapy This is likely due to recent treatment. The patient denies recent history of bleeding such as epistaxis, hematuria or hematochezia. She is asymptomatic from the anemia. I will observe for now. If the anemia gets progressive worse in the future, I might have to delay her treatment or adjust the chemotherapy dose.   Type 2 diabetes mellitus treated without insulin (HCC) Her blood sugar level is stable.  Monitor closely.   No orders of the defined types were placed in this encounter.   INTERVAL HISTORY: Please see below for problem oriented charting. She returns with her husband for further follow-up She tolerated olaparib well Denies side effects from treatment so far No worsening neuropathy from prior treatment Denies nausea, abdominal bloating or changes in bowel habits. 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 The left adnexa measured 12.7 x  11.6 x 8.1 with low level echoes and soft tissue nodules     07/30/2017 Tumor Marker    Patient's tumor was tested for the following markers: CA-125 Results of the tumor marker test revealed 521.3    08/17/2017 Pathology Results    The malignant cells are positive for PAX-8, cytokeratin 7, estrogen receptor, and faintly positive for GATA-3. They are negative for p53, GCDFP, and cytokeratin 20. The finding are consistent with a gynecologic primary carcinoma. Additional studies can be performed upon clinician request.    08/17/2017 Procedure    Technically successful CT-guided left lower quadrant omental mass core biopsy.    08/20/2017 PET scan    1. Cystic masses arising from the pelvis. The nodular component of the RIGHT cystic mass is intensely hypermetabolic consistent with malignant ovarian neoplasm. 2. Extensive hypermetabolic peritoneal thickening in the lower abdomen and upper pelvis, upper abdomen, and upper abdominal precordial fat and paradiaphragmatic fat. 3. Retroperitoneal nodal metastasis adjacent to the IVC at the level the kidneys. 4. No evidence of metastatic disease in the thorax other small effusion on the LEFT and nodal metastasis in the fat superior to the diaphragm. 5. Mild metabolic activity associated the distal esophagus is favored benign esophagitis.    08/20/2017 Imaging    CT chest 1. Bilateral cardiophrenic angle nodal metastasis. No additional findings to suggest metastatic disease to the chest. 2. Small left pleural effusion. 3. Peritoneal carcinomatosis noted within the abdomen. 4. Hepatic steatosis.     08/24/2017 Cancer Staging    Staging form: Ovary, Fallopian Tube, and Primary Peritoneal Carcinoma, AJCC 8th Edition -  Clinical: Stage IV (cT3, cN1, cM1) - Signed by Gorsuch, Ni, MD on 08/24/2017    08/31/2017 Tumor Marker    Patient's tumor was tested for the following markers: CA-125 Results of the tumor marker test revealed 819.9    09/26/2017 Adverse  Reaction    She developed reaction to Taxol, managed successfully with additional premedications    10/17/2017 Tumor Marker    Patient's tumor was tested for the following markers: CA-125 Results of the tumor marker test revealed 374.4    10/31/2017 Imaging    1. No significant change in size of the large complex bilateral adnexal masses consistent with the known history of ovarian cancer. There is increased dependent density within the left-sided lesion. 2. Stable peritoneal carcinomatosis.  No significant ascites.  3. The bilateral pericardiac adenopathy has improved. No progressive thoracic metastatic disease. No pulmonary or osseous metastatic disease. 4. Suspicion of nonocclusive thrombus in the right internal jugular vein related to the right IJ port. Recommend further evaluation with Doppler ultrasound.    11/13/2017 Surgery    Preoperative Diagnosis: Ovarian cancer s/p neoadjuvant chemotherapy   Procedure(s) Performed:   1. Exploratory laparotomy  2. Bilateral salpingo-oophorectomy with radical tumor debulking for ovarian cancer   including retroperitoneal dissection, lysis of adhesions (enterolysis of small bowel in deep pelvis, left adnexal adhesions to sigmoid and omentum to LLQ) ~1 hour, ureterolysis. 3. Omentectomy  4. Takedown of splenic flexure with removal of omental tumor in left upper quadrant  Specimens: Bilateral tubes and ovaries, omentum, splenic flexure nodule, right paracolic gutter nodule, cecal nodule, right ureteral peritoneal nodule, small bowel mesentary.  Indication for Procedure:  Patient is s/p 3 cycles of chemotherapy with response based on CA125 and decreased mediastinal disease (to <1cm).  Operative Findings:  This represented an optimal cytoreduction with gross residual disease remaining in the deep pelvis near the levator floor and possibly in the region of the right IP ligament/periappendicial region.  Upon entry a large ~20cm right tube/ovary,  mostly cystic. Adherent rind along ileocecal region and appendix. Large cystic left tube/ovary ~10cm with sigmoid colon stretched across the mass and extension of the cystic lesion into the deep pelvis with residual palpable disease deep pelvis near levators. Estimate ~1cm or less on palpation, not visible disease. Omental caking in the LLQ adherent to pelvic sidewall. Omental disease noted RUQ separate. In addition ~1.5cm lesion in splenic flexure omentum. Evidence of prior diaphragmatic disease, no visible disease.       11/13/2017 Pathology Results    1. 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 - TUMOR INVOLVES THE OVARIAN SURFACE. - RIGHT FALLOPIAN TUBE IS INVOLVED. - SEE ONCOLOGY TABLE. - SEE NOTE. 2. Soft tissue mass, biopsy, cecal gutter nodule - METASTATIC MUCINOUS ADENOCARCINOMA. 3. Ovary and fallopian tube, left, left ovary and fallopian tube - METASTATIC MUCINOUS ADENOCARCINOMA TO LEFT OVARY AND FALLOPIAN TUBE. 4. Omentum, resection for tumor - METASTATIC MUCINOUS ADENOCARCINOMA TO OMENTUM. 5. Soft tissue mass, biopsy, splenic flexure nodule - METASTATIC MUCINOUS ADENOCARCINOMA. 6. Soft tissue mass, biopsy, cecal implant - METASTATIC MUCINOUS ADENOCARCINOMA. 7. Soft tissue mass, biopsy, right ureteral peritoneal implant - METASTATIC MUCINOUS ADENOCARCINOMA. 8. Mesentery, small bowel nodule - METASTATIC MUCINOUS ADENOCARCINOMA. Microscopic Comment 1. OVARY or FALLOPIAN TUBE or PRIMARY PERITONEUM: Procedure: Bilateral salpingo-oophorectomy. Specimen Integrity: Intact. Tumor Site: Right ovary. Ovarian Surface Involvement: Present. Fallopian Tube Surface Involvement: Present. Tumor Size: 20 cm. Histologic Type: Mucinous adenocarcinoma. Histologic Grade: Overall G2 (moderately differentiated) (focal areas   of poor differentiation are present). Implants: Present. Other Tissue/ Organ Involvement: Left ovary, left fallopian  tube and omentum. Largest Extrapelvic Peritoneal Focus: less than 2 cm. See note Peritoneal/Ascitic Fluid: N/A Treatment Effect: No definite or minimal response identified (chemotherapy response score 1 [CRS 1]) Regional Lymph Nodes No lymph nodes submitted or found Number of Lymph Nodes Examined: 0 Pathologic Stage Classification (pTNM, AJCC 8th Edition): ypT3b, ypNX. Representative Tumor Block: 1D and 1E. (NDK:gt, 11/15/17)    11/19/2017 Genetic Testing    Negative genetic testing on the [facility] Myrisk panel.  The MyRisk gene panel offered by [facility] includes sequencing and deletion/duplication testing of the following 35 genes: APC, ATM, AXIN2, BARD1, BMPR1A, BRCA1, BRCA2, BRIP1, CHD1, CDK4, CDKN2A, CHEK2, EPCAM (large rearrangement only), HOXB13, (sequencing only), GALNT12, MLH1, MSH2, MSH3 (excluding repetitive portions of exon 1), MSH6, MUTYH, NBN, NTHL1, PALB2, PMS2, PTEN, RAD51C, RAD51D, RNF43, RPS20, SMAD4, STK11, and TP53. Sequencing was performed for select regions of POLE and POLD1, and large rearrangement analysis was performed for select regions of GREM1. The report date is November 19, 2017.  HRD tumor results indicate a BRCA1 mutation identified in the ovarian tumor causing genomic instability. The report date is 12/04/2017      Genetic Testing    Patient has genetic testing done for ER/PR and Her2/neu. Results revealed patient has the following: ER 15-20% PR 5-10% Her2/neu - negative     Genetic Testing    Patient has genetic testing done for MSI/MMR. Results revealed patient has the following mutation(s): MMR normal, MSI stable    12/10/2017 Imaging    1. Interval resection of large bilateral adnexal cystic masses. Residual soft tissue/tumor within bilateral adnexal regions as above. 2. Interval resolution of previously noted omental cake which may reflect interval omentectomy. There is a new peritoneal deposit identified within the left upper quadrant of  the abdomen involving the anterior surface of the spleen.    12/10/2017 Tumor Marker    Patient's tumor was tested for the following markers: CA-125 Results of the tumor marker test revealed 94.9    12/11/2017 - 02/20/2018 Chemotherapy    The patient had FOLFOX regimen for mucinous    01/09/2018 Tumor Marker    Patient's tumor was tested for the following markers: CA-125 Results of the tumor marker test revealed 37    01/31/2018 Imaging    Resolution of peritoneal soft tissue density along the anterior margin of the spleen since prior study. No evidence of residual or progressive metastatic disease.  Mild sigmoid diverticulosis, without radiographic evidence of diverticulitis. Resolution of small pericolonic abscess along the left lateral pelvic sidewall.  Mild hepatic steatosis.    02/06/2018 Tumor Marker    Patient's tumor was tested for the following markers: CA-125 Results of the tumor marker test revealed 36.7    03/06/2018 -  Chemotherapy    The patient is started on [name] as PARP maintenance    03/13/2018 Tumor Marker    Patient's tumor was tested for the following markers: CA-125 Results of the tumor marker test revealed 33.8    04/05/2018 Tumor Marker    Patient's tumor was tested for the following markers: CA-125 Results of the tumor marker test revealed 27.3     REVIEW OF SYSTEMS:   Constitutional: Denies fevers, chills or abnormal weight loss Eyes: Denies blurriness of vision Ears, nose, mouth, throat, and face: Denies mucositis or sore throat Respiratory: Denies cough, dyspnea or wheezes Cardiovascular: Denies palpitation, chest discomfort or lower extremity swelling Gastrointestinal:    Denies nausea, heartburn or change in bowel habits Skin: Denies abnormal skin rashes Lymphatics: Denies new lymphadenopathy or easy bruising Neurological:Denies numbness, tingling or new weaknesses Behavioral/Psych: Mood is stable, no new changes  All other systems were  reviewed with the patient and are negative.  I have reviewed the past medical history, past surgical history, social history and family history with the patient and they are unchanged from previous note.  ALLERGIES:  has No Known Allergies.  MEDICATIONS:  Current Outpatient Medications  Medication Sig Dispense Refill  . acetaminophen (TYLENOL) 500 MG tablet Take 500 mg by mouth every 8 (eight) hours as needed for mild pain or moderate pain.    . atenolol (TENORMIN) 25 MG tablet Take 25 mg by mouth at bedtime.     . CALCIUM-MAGNESIUM-VITAMIN D PO Take 1 tablet by mouth daily.    . famotidine (PEPCID) 20 MG tablet Take 20 mg by mouth as needed for heartburn or indigestion.    . lidocaine-prilocaine (EMLA) cream Apply 1 application topically as needed. 30 g 6  . loratadine (CLARITIN) 10 MG tablet Take 10 mg by mouth daily as needed for allergies.    . metFORMIN (GLUCOPHAGE) 1000 MG tablet Take 500 mg by mouth 2 (two) times daily.   0  . Multiple Vitamin (MULTIVITAMIN WITH MINERALS) TABS tablet Take 1 tablet by mouth daily.    . olaparib ([name]) 100 MG tablet Take 2 tablets (200 mg total) by mouth 2 (two) times daily. Swallow whole. 120 tablet 11  . ondansetron (ZOFRAN) 8 MG tablet Take 1 tablet (8 mg total) by mouth every 8 (eight) hours as needed for nausea. (Patient not taking: Reported on 04/03/2018) 30 tablet 3  . prochlorperazine (COMPAZINE) 10 MG tablet Take 1 tablet (10 mg total) by mouth every 6 (six) hours as needed for nausea or vomiting. (Patient not taking: Reported on 04/03/2018) 30 tablet 0   No current facility-administered medications for this visit.     PHYSICAL EXAMINATION: ECOG PERFORMANCE STATUS: 0 - Asymptomatic  Vitals:   05/03/18 0946  BP: 129/62  Pulse: 72  Resp: 18  Temp: 98.2 F (36.8 C)  SpO2: 100%   Filed Weights   05/03/18 0946  Weight: 142 lb (64.4 kg)    GENERAL:alert, no distress and comfortable SKIN: skin color, texture, turgor are normal, no  rashes or significant lesions EYES: normal, Conjunctiva are pink and non-injected, sclera clear OROPHARYNX:no exudate, no erythema and lips, buccal mucosa, and tongue normal  NECK: supple, thyroid normal size, non-tender, without nodularity LYMPH:  no palpable lymphadenopathy in the cervical, axillary or inguinal LUNGS: clear to auscultation and percussion with normal breathing effort HEART: regular rate & rhythm and no murmurs and no lower extremity edema ABDOMEN:abdomen soft, non-tender and normal bowel sounds Musculoskeletal:no cyanosis of digits and no clubbing  NEURO: alert & oriented x 3 with fluent speech, no focal motor/sensory deficits  LABORATORY DATA:  I have reviewed the data as listed    Component Value Date/Time   NA 141 05/03/2018 0937   K 3.9 05/03/2018 0937   CL 104 05/03/2018 0937   CO2 27 05/03/2018 0937   GLUCOSE 108 (H) 05/03/2018 0937   BUN 15 05/03/2018 0937   CREATININE 0.96 05/03/2018 0937   CREATININE 0.86 06/05/2014 0957   CALCIUM 9.6 05/03/2018 0937   PROT 7.3 05/03/2018 0937   ALBUMIN 3.7 05/03/2018 0937   AST 16 05/03/2018 0937   ALT 10 05/03/2018 0937   ALKPHOS 66 05/03/2018 0937     BILITOT 0.4 05/03/2018 0937   GFRNONAA >60 05/03/2018 0937   GFRAA >60 05/03/2018 0937    No results found for: SPEP, UPEP  Lab Results  Component Value Date   WBC 6.2 05/03/2018   NEUTROABS 4.0 05/03/2018   HGB 11.9 (L) 05/03/2018   HCT 36.4 05/03/2018   MCV 102.2 (H) 05/03/2018   PLT 181 05/03/2018      Chemistry      Component Value Date/Time   NA 141 05/03/2018 0937   K 3.9 05/03/2018 0937   CL 104 05/03/2018 0937   CO2 27 05/03/2018 0937   BUN 15 05/03/2018 0937   CREATININE 0.96 05/03/2018 0937   CREATININE 0.86 06/05/2014 0957      Component Value Date/Time   CALCIUM 9.6 05/03/2018 0937   ALKPHOS 66 05/03/2018 0937   AST 16 05/03/2018 0937   ALT 10 05/03/2018 0937   BILITOT 0.4 05/03/2018 0937      All questions were answered. The  patient knows to call the clinic with any problems, questions or concerns. No barriers to learning was detected.  I spent 15 minutes counseling the patient face to face. The total time spent in the appointment was 20 minutes and more than 50% was on counseling and review of test results  Ni Gorsuch, MD 05/03/2018 10:32 AM  

## 2018-05-03 NOTE — Assessment & Plan Note (Signed)
Her blood sugar level is stable.  Monitor closely.

## 2018-05-03 NOTE — Telephone Encounter (Signed)
Gave avs and calendar ° °

## 2018-05-03 NOTE — Assessment & Plan Note (Signed)
She tolerated olaparib well without side effects Her blood counts are within normal limits Her tumor markers are improving She will continue follow-up with GYN oncologist every 3 months and port flushes with lab draw every 6 weeks I plan to see her once a year with repeat imaging study, due around October of this year We discussed the signs and symptoms to watch out for.  She is comfortable to wait until October for repeat imaging study

## 2018-05-03 NOTE — Assessment & Plan Note (Signed)
This is likely due to recent treatment. The patient denies recent history of bleeding such as epistaxis, hematuria or hematochezia. She is asymptomatic from the anemia. I will observe for now. If the anemia gets progressive worse in the future, I might have to delay her treatment or adjust the chemotherapy dose.

## 2018-05-04 LAB — CA 125: Cancer Antigen (CA) 125: 23.5 U/mL (ref 0.0–38.1)

## 2018-05-06 ENCOUNTER — Telehealth: Payer: Self-pay

## 2018-05-06 NOTE — Telephone Encounter (Signed)
-----   Message from [name], MD sent at 05/06/2018  9:02 AM EST ----- Regarding: ca-125 Let her know the number is still improving ----- Message ----- From: Interface, Lab In [place] Sent: 05/03/2018   9:50 AM EST To: [name], MD

## 2018-05-06 NOTE — Telephone Encounter (Signed)
Called and given below message. She verbalized understanding. 

## 2018-05-07 DIAGNOSIS — Z79899 Other long term (current) drug therapy: Secondary | ICD-10-CM | POA: Diagnosis not present

## 2018-05-07 DIAGNOSIS — E119 Type 2 diabetes mellitus without complications: Secondary | ICD-10-CM | POA: Diagnosis not present

## 2018-05-07 DIAGNOSIS — I10 Essential (primary) hypertension: Secondary | ICD-10-CM | POA: Diagnosis not present

## 2018-05-07 DIAGNOSIS — C569 Malignant neoplasm of unspecified ovary: Secondary | ICD-10-CM | POA: Diagnosis not present

## 2018-05-07 DIAGNOSIS — C762 Malignant neoplasm of abdomen: Secondary | ICD-10-CM | POA: Diagnosis not present

## 2018-05-07 DIAGNOSIS — K219 Gastro-esophageal reflux disease without esophagitis: Secondary | ICD-10-CM | POA: Diagnosis not present

## 2018-05-07 DIAGNOSIS — T451X5D Adverse effect of antineoplastic and immunosuppressive drugs, subsequent encounter: Secondary | ICD-10-CM | POA: Diagnosis not present

## 2018-05-07 DIAGNOSIS — R110 Nausea: Secondary | ICD-10-CM | POA: Diagnosis not present

## 2018-05-07 DIAGNOSIS — Z7984 Long term (current) use of oral hypoglycemic drugs: Secondary | ICD-10-CM | POA: Diagnosis not present

## 2018-05-07 DIAGNOSIS — J309 Allergic rhinitis, unspecified: Secondary | ICD-10-CM | POA: Diagnosis not present

## 2018-05-16 MED FILL — LYNPARZA 100 MG TAB: 100 | 30 days supply | Qty: 120 | Fill #3

## 2018-06-06 ENCOUNTER — Inpatient Hospital Stay: Payer: Medicare HMO | Attending: Obstetrics

## 2018-06-06 ENCOUNTER — Inpatient Hospital Stay: Payer: Medicare HMO

## 2018-06-06 DIAGNOSIS — C561 Malignant neoplasm of right ovary: Secondary | ICD-10-CM | POA: Insufficient documentation

## 2018-06-06 DIAGNOSIS — R590 Localized enlarged lymph nodes: Secondary | ICD-10-CM

## 2018-06-06 DIAGNOSIS — Z452 Encounter for adjustment and management of vascular access device: Secondary | ICD-10-CM | POA: Insufficient documentation

## 2018-06-06 LAB — CMP (CANCER CENTER ONLY)
ALBUMIN: 3.6 g/dL (ref 3.5–5.0)
ALT: 10 U/L (ref 0–44)
AST: 19 U/L (ref 15–41)
Alkaline Phosphatase: 63 U/L (ref 38–126)
Anion gap: 10 (ref 5–15)
BUN: 17 mg/dL (ref 8–23)
CO2: 27 mmol/L (ref 22–32)
CREATININE: 0.98 mg/dL (ref 0.44–1.00)
Calcium: 9.5 mg/dL (ref 8.9–10.3)
Chloride: 104 mmol/L (ref 98–111)
GFR, Est AFR Am: >60 mL/min (ref >60)
GFR, Estimated: 60 mL/min — ABNORMAL LOW (ref >60)
Glucose, Bld: 127 mg/dL — ABNORMAL HIGH (ref 70–99)
Potassium: 4.1 mmol/L (ref 3.5–5.1)
Sodium: 141 mmol/L (ref 135–145)
Total Bilirubin: 0.4 mg/dL (ref 0.3–1.2)
Total Protein: 7.2 g/dL (ref 6.5–8.1)

## 2018-06-06 LAB — CBC WITH DIFFERENTIAL (CANCER CENTER ONLY)
Abs Immature Granulocytes: 0.01 10*3/uL (ref 0.00–0.07)
Basophils Absolute: 0 10*3/uL (ref 0.0–0.1)
Basophils Relative: 1 %
Eosinophils Absolute: 0.2 10*3/uL (ref 0.0–0.5)
Eosinophils Relative: 3 %
HEMATOCRIT: 36.3 % (ref 36.0–46.0)
Hemoglobin: 12 g/dL (ref 12.0–15.0)
Immature Granulocytes: 0 %
LYMPHS ABS: 1.6 10*3/uL (ref 0.7–4.0)
Lymphocytes Relative: 26 %
MCH: 34.2 pg — ABNORMAL HIGH (ref 26.0–34.0)
MCHC: 33.1 g/dL (ref 30.0–36.0)
MCV: 103.4 fL — AB (ref 80.0–100.0)
Monocytes Absolute: 0.5 10*3/uL (ref 0.1–1.0)
Monocytes Relative: 8 %
Neutro Abs: 3.7 10*3/uL (ref 1.7–7.7)
Neutrophils Relative %: 62 %
Platelet Count: 174 10*3/uL (ref 150–400)
RBC: 3.51 MIL/uL — ABNORMAL LOW (ref 3.87–5.11)
RDW: 15 % (ref 11.5–15.5)
WBC Count: 5.9 10*3/uL (ref 4.0–10.5)
nRBC: 0 % (ref 0.0–0.2)

## 2018-06-06 MED ORDER — HEPARIN SOD (PORK) LOCK FLUSH 100 UNIT/ML IV SOLN
500.0000 [IU] | Freq: Once | INTRAVENOUS | Status: AC
  Administered 2018-06-06: 500 [IU]
  Filled 2018-06-06: qty 5

## 2018-06-06 MED ORDER — SODIUM CHLORIDE 0.9% FLUSH
10.0000 mL | Freq: Once | INTRAVENOUS | Status: AC
  Administered 2018-06-06: 10 mL
  Filled 2018-06-06: qty 10

## 2018-06-07 ENCOUNTER — Telehealth: Payer: Self-pay | Admitting: *Deleted

## 2018-06-07 LAB — CA 125: Cancer Antigen (CA) 125: 22.1 U/mL (ref 0.0–38.1)

## 2018-06-07 NOTE — Telephone Encounter (Signed)
Telephone call to patient discussed lab results as directed below. Patient understands an appt to see Dr. Alvy Bimler will be added to her April appts.

## 2018-06-07 NOTE — Telephone Encounter (Signed)
-----   Message from [name], MD sent at 06/07/2018  8:25 AM EST ----- Regarding: CA-125 and appt Pls let her know labs are ok I want to see her in April after her labs. Scheduler will call her

## 2018-06-21 MED FILL — LYNPARZA 100 MG TAB: 100 | 30 days supply | Qty: 120 | Fill #4

## 2018-07-08 ENCOUNTER — Inpatient Hospital Stay: Payer: Medicare HMO

## 2018-07-08 ENCOUNTER — Other Ambulatory Visit: Payer: Self-pay

## 2018-07-08 ENCOUNTER — Ambulatory Visit: Payer: Medicare HMO | Admitting: Obstetrics

## 2018-07-08 ENCOUNTER — Other Ambulatory Visit: Payer: Medicare HMO

## 2018-07-08 ENCOUNTER — Inpatient Hospital Stay: Payer: Medicare HMO | Attending: Obstetrics

## 2018-07-08 DIAGNOSIS — C561 Malignant neoplasm of right ovary: Secondary | ICD-10-CM | POA: Diagnosis not present

## 2018-07-08 DIAGNOSIS — R590 Localized enlarged lymph nodes: Secondary | ICD-10-CM

## 2018-07-08 DIAGNOSIS — Z452 Encounter for adjustment and management of vascular access device: Secondary | ICD-10-CM | POA: Diagnosis not present

## 2018-07-08 LAB — CBC WITH DIFFERENTIAL (CANCER CENTER ONLY)
Abs Immature Granulocytes: 0.02 10*3/uL (ref 0.00–0.07)
Basophils Absolute: 0 10*3/uL (ref 0.0–0.1)
Basophils Relative: 0 %
Eosinophils Absolute: 0.1 10*3/uL (ref 0.0–0.5)
Eosinophils Relative: 1 %
HCT: 38.3 % (ref 36.0–46.0)
HEMOGLOBIN: 12.7 g/dL (ref 12.0–15.0)
Immature Granulocytes: 0 %
LYMPHS PCT: 33 %
Lymphs Abs: 2.3 10*3/uL (ref 0.7–4.0)
MCH: 34.9 pg — ABNORMAL HIGH (ref 26.0–34.0)
MCHC: 33.2 g/dL (ref 30.0–36.0)
MCV: 105.2 fL — ABNORMAL HIGH (ref 80.0–100.0)
Monocytes Absolute: 0.5 10*3/uL (ref 0.1–1.0)
Monocytes Relative: 8 %
NEUTROS ABS: 4 10*3/uL (ref 1.7–7.7)
Neutrophils Relative %: 58 %
Platelet Count: 163 10*3/uL (ref 150–400)
RBC: 3.64 MIL/uL — AB (ref 3.87–5.11)
RDW: 14.2 % (ref 11.5–15.5)
WBC Count: 7 10*3/uL (ref 4.0–10.5)
nRBC: 0 % (ref 0.0–0.2)

## 2018-07-08 LAB — CMP (CANCER CENTER ONLY)
ALT: 12 U/L (ref 0–44)
AST: 18 U/L (ref 15–41)
Albumin: 4.1 g/dL (ref 3.5–5.0)
Alkaline Phosphatase: 65 U/L (ref 38–126)
Anion gap: 11 (ref 5–15)
BUN: 14 mg/dL (ref 8–23)
CO2: 26 mmol/L (ref 22–32)
Calcium: 10.1 mg/dL (ref 8.9–10.3)
Chloride: 104 mmol/L (ref 98–111)
Creatinine: 0.95 mg/dL (ref 0.44–1.00)
GFR, Est AFR Am: >60 mL/min (ref >60)
GFR, Estimated: >60 mL/min (ref >60)
Glucose, Bld: 84 mg/dL (ref 70–99)
POTASSIUM: 4 mmol/L (ref 3.5–5.1)
Sodium: 141 mmol/L (ref 135–145)
Total Bilirubin: 0.4 mg/dL (ref 0.3–1.2)
Total Protein: 7.5 g/dL (ref 6.5–8.1)

## 2018-07-08 MED ORDER — HEPARIN SOD (PORK) LOCK FLUSH 100 UNIT/ML IV SOLN
500.0000 [IU] | Freq: Once | INTRAVENOUS | Status: AC
  Administered 2018-07-08: 500 [IU]
  Filled 2018-07-08: qty 5

## 2018-07-08 MED ORDER — SODIUM CHLORIDE 0.9% FLUSH
10.0000 mL | Freq: Once | INTRAVENOUS | Status: AC
  Administered 2018-07-08: 10 mL
  Filled 2018-07-08: qty 10

## 2018-07-09 ENCOUNTER — Telehealth: Payer: Self-pay

## 2018-07-09 LAB — CA 125: Cancer Antigen (CA) 125: 19.9 U/mL (ref 0.0–38.1)

## 2018-07-09 NOTE — Telephone Encounter (Signed)
Called and given below msg

## 2018-07-09 NOTE — Telephone Encounter (Signed)
-----   Message from [name], MD sent at 07/09/2018  7:55 AM EDT ----- Regarding: labs yesterday Pls let her know labs and CA-125 is better

## 2018-07-19 ENCOUNTER — Encounter (INDEPENDENT_AMBULATORY_CARE_PROVIDER_SITE_OTHER): Payer: Self-pay | Admitting: *Deleted

## 2018-07-25 DIAGNOSIS — Z0000 Encounter for general adult medical examination without abnormal findings: Secondary | ICD-10-CM | POA: Diagnosis not present

## 2018-07-25 MED FILL — LYNPARZA 100 MG TAB: 100 | 30 days supply | Qty: 120 | Fill #5

## 2018-07-29 ENCOUNTER — Other Ambulatory Visit (INDEPENDENT_AMBULATORY_CARE_PROVIDER_SITE_OTHER): Payer: Self-pay | Admitting: Internal Medicine

## 2018-07-29 DIAGNOSIS — K862 Cyst of pancreas: Secondary | ICD-10-CM

## 2018-07-30 ENCOUNTER — Telehealth: Payer: Self-pay | Admitting: Oncology

## 2018-07-30 NOTE — Telephone Encounter (Signed)
Called Sabrina Vang and discussed that she will see either Dr. Skeet Latch or Dr. Fermin Schwab with Gyn Onc and that I will check with Dr. Alvy Bimler tomorrow to see when she needs to be seen and call her back.  She verbalized agreement.

## 2018-08-01 NOTE — Telephone Encounter (Signed)
Discussed with Sabrina Vang that Dr. Alvy Bimler will be monitoring her labs and scans so she does not need to schedule with Gyn Onc now.  She verbalized agreement.

## 2018-08-19 ENCOUNTER — Inpatient Hospital Stay (HOSPITAL_BASED_OUTPATIENT_CLINIC_OR_DEPARTMENT_OTHER): Payer: Medicare HMO | Admitting: Hematology and Oncology

## 2018-08-19 ENCOUNTER — Encounter: Payer: Self-pay | Admitting: Hematology and Oncology

## 2018-08-19 ENCOUNTER — Inpatient Hospital Stay: Payer: Medicare HMO | Attending: Obstetrics

## 2018-08-19 ENCOUNTER — Inpatient Hospital Stay: Payer: Medicare HMO

## 2018-08-19 ENCOUNTER — Telehealth: Payer: Self-pay

## 2018-08-19 ENCOUNTER — Other Ambulatory Visit: Payer: Self-pay

## 2018-08-19 ENCOUNTER — Telehealth: Payer: Self-pay | Admitting: Hematology and Oncology

## 2018-08-19 VITALS — BP 125/68 | HR 67 | Temp 98.4°F | Resp 18 | Ht 65.0 in | Wt 147.6 lb

## 2018-08-19 DIAGNOSIS — C786 Secondary malignant neoplasm of retroperitoneum and peritoneum: Secondary | ICD-10-CM | POA: Diagnosis not present

## 2018-08-19 DIAGNOSIS — C561 Malignant neoplasm of right ovary: Secondary | ICD-10-CM | POA: Diagnosis not present

## 2018-08-19 DIAGNOSIS — R590 Localized enlarged lymph nodes: Secondary | ICD-10-CM

## 2018-08-19 LAB — CBC WITH DIFFERENTIAL (CANCER CENTER ONLY)
Abs Immature Granulocytes: 0.04 10*3/uL (ref 0.00–0.07)
Basophils Absolute: 0 10*3/uL (ref 0.0–0.1)
Basophils Relative: 1 %
Eosinophils Absolute: 0.1 10*3/uL (ref 0.0–0.5)
Eosinophils Relative: 1 %
HCT: 40.2 % (ref 36.0–46.0)
Hemoglobin: 13.4 g/dL (ref 12.0–15.0)
Immature Granulocytes: 1 %
Lymphocytes Relative: 29 %
Lymphs Abs: 2.1 10*3/uL (ref 0.7–4.0)
MCH: 35.5 pg — ABNORMAL HIGH (ref 26.0–34.0)
MCHC: 33.3 g/dL (ref 30.0–36.0)
MCV: 106.6 fL — ABNORMAL HIGH (ref 80.0–100.0)
Monocytes Absolute: 0.4 10*3/uL (ref 0.1–1.0)
Monocytes Relative: 6 %
Neutro Abs: 4.6 10*3/uL (ref 1.7–7.7)
Neutrophils Relative %: 62 %
Platelet Count: 171 10*3/uL (ref 150–400)
RBC: 3.77 MIL/uL — ABNORMAL LOW (ref 3.87–5.11)
RDW: 14.5 % (ref 11.5–15.5)
WBC Count: 7.3 10*3/uL (ref 4.0–10.5)
nRBC: 0.3 % — ABNORMAL HIGH (ref 0.0–0.2)

## 2018-08-19 LAB — CMP (CANCER CENTER ONLY)
ALT: 12 U/L (ref 0–44)
AST: 18 U/L (ref 15–41)
Albumin: 4 g/dL (ref 3.5–5.0)
Alkaline Phosphatase: 73 U/L (ref 38–126)
Anion gap: 12 (ref 5–15)
BUN: 14 mg/dL (ref 8–23)
CO2: 26 mmol/L (ref 22–32)
Calcium: 9.9 mg/dL (ref 8.9–10.3)
Chloride: 105 mmol/L (ref 98–111)
Creatinine: 1.01 mg/dL — ABNORMAL HIGH (ref 0.44–1.00)
GFR, Est AFR Am: >60 mL/min (ref >60)
GFR, Estimated: 57 mL/min — ABNORMAL LOW (ref >60)
Glucose, Bld: 123 mg/dL — ABNORMAL HIGH (ref 70–99)
Potassium: 3.9 mmol/L (ref 3.5–5.1)
Sodium: 143 mmol/L (ref 135–145)
Total Bilirubin: 0.3 mg/dL (ref 0.3–1.2)
Total Protein: 7.9 g/dL (ref 6.5–8.1)

## 2018-08-19 MED FILL — LYNPARZA 100 MG TAB: 100 | 30 days supply | Qty: 120 | Fill #6

## 2018-08-19 NOTE — Telephone Encounter (Signed)
Scheduled 5/11 and 7/6 appt per sch msg. Mailed printout

## 2018-08-19 NOTE — Telephone Encounter (Signed)
Oral Oncology Patient Advocate Encounter  [employer] notified me that the patients copay this month is $771.27. This is not affordable for the patient. The copay has been $50 in the past and the patient will follow up with the insurance company with her concerns about the increase in her copay.  Was successful in securing patient an $ 4000 grant from [employer] (PA[employer]) to provide copayment coverage for her [name].  This will keep the out of pocket expense at $0.    I have spoken with the patient.    The billing information is as follows and has been shared with [place].   Member ID: [id number] Group ID: [id number] RxBin: [id number] Dates of Eligibility: 08/19/18 through 08/19/19  [place] [facility] [place] Phone [phone] Fax [phone] 08/19/2018    9:33 AM

## 2018-08-19 NOTE — Assessment & Plan Note (Signed)
She tolerated olaparib well without side effects Her blood counts are within normal limits Her tumor markers are improving So far, she had no signs or symptoms to suggest disease progression She is aware to watch out for the symptoms She is comfortable to wait until October for repeat imaging study

## 2018-08-19 NOTE — Progress Notes (Signed)
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 CA 125    Standing Status:   Standing    Number of Occurrences:   9    Standing Expiration Date:   08/19/2019    INTERVAL HISTORY: Please see below for problem oriented charting. 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 The left adnexa measured 12.7 x 11.6 x 8.1 with low level echoes and soft tissue nodules     07/30/2017 Tumor Marker    Patient's tumor was tested for the following markers: CA-125 Results of the tumor marker test revealed 521.3    08/17/2017 Pathology Results    The malignant cells are positive for PAX-8, cytokeratin 7, estrogen receptor, and faintly positive for GATA-3. They are negative for p53, GCDFP, and cytokeratin 20. The finding are consistent with a gynecologic primary carcinoma. Additional studies can be performed upon clinician request.    08/17/2017 Procedure    Technically successful CT-guided  left lower quadrant omental mass core biopsy.    08/20/2017 PET scan    1. Cystic masses arising from the pelvis. The nodular component of the RIGHT cystic mass is intensely hypermetabolic consistent with malignant ovarian neoplasm. 2. Extensive hypermetabolic peritoneal thickening in the lower abdomen and upper pelvis, upper abdomen, and upper abdominal precordial fat and paradiaphragmatic fat. 3. Retroperitoneal nodal metastasis adjacent to the IVC at the level the kidneys. 4. No evidence of metastatic disease in the thorax other small effusion on the LEFT and nodal metastasis in the fat superior to the diaphragm. 5. Mild metabolic activity associated the distal esophagus is favored benign esophagitis.    08/20/2017 Imaging    CT chest 1. Bilateral cardiophrenic angle nodal metastasis. No additional findings to suggest metastatic disease to the chest. 2. Small left pleural effusion. 3. Peritoneal carcinomatosis noted within the abdomen. 4. Hepatic steatosis.     08/24/2017 Cancer Staging    Staging form: Ovary, Fallopian Tube, and Primary Peritoneal Carcinoma, AJCC 8th Edition - Clinical: Stage IV (cT3, cN1, cM1) - Signed by [name], MD on 08/24/2017    08/31/2017 Tumor Marker    Patient's tumor was tested for the following markers: CA-125 Results of the tumor marker test revealed 819.9    09/26/2017 Adverse Reaction    She developed reaction to Taxol, managed successfully with additional premedications    10/17/2017 Tumor Marker    Patient's tumor was tested for the following markers: CA-125 Results of the tumor marker test revealed 374.4  10/31/2017 Imaging    1. No significant change in size of the large complex bilateral adnexal masses consistent with the known history of ovarian cancer. There is increased dependent density within the left-sided lesion. 2. Stable peritoneal carcinomatosis.  No significant ascites.  3. The bilateral pericardiac adenopathy has improved. No  progressive thoracic metastatic disease. No pulmonary or osseous metastatic disease. 4. Suspicion of nonocclusive thrombus in the right internal jugular vein related to the right IJ port. Recommend further evaluation with Doppler ultrasound.    11/13/2017 Surgery    Preoperative Diagnosis: Ovarian cancer s/p neoadjuvant chemotherapy   Procedure(s) Performed:   1. Exploratory laparotomy  2. Bilateral salpingo-oophorectomy with radical tumor debulking for ovarian cancer   including retroperitoneal dissection, lysis of adhesions (enterolysis of small bowel in deep pelvis, left adnexal adhesions to sigmoid and omentum to LLQ) ~1 hour, ureterolysis. 3. Omentectomy  4. Takedown of splenic flexure with removal of omental tumor in left upper quadrant  Specimens: Bilateral tubes and ovaries, omentum, splenic flexure nodule, right paracolic gutter nodule, cecal nodule, right ureteral peritoneal nodule, small bowel mesentary.  Indication for Procedure:  Patient is s/p 3 cycles of chemotherapy with response based on CA125 and decreased mediastinal disease (to <1cm).  Operative Findings:  This represented an optimal cytoreduction with gross residual disease remaining in the deep pelvis near the levator floor and possibly in the region of the right IP ligament/periappendicial region.  Upon entry a large ~20cm right tube/ovary, mostly cystic. Adherent rind along ileocecal region and appendix. Large cystic left tube/ovary ~10cm with sigmoid colon stretched across the mass and extension of the cystic lesion into the deep pelvis with residual palpable disease deep pelvis near levators. Estimate ~1cm or less on palpation, not visible disease. Omental caking in the LLQ adherent to pelvic sidewall. Omental disease noted RUQ separate. In addition ~1.5cm lesion in splenic flexure omentum. Evidence of prior diaphragmatic disease, no visible disease.       11/13/2017 Pathology Results    1. Ovary and  fallopian tube, right - INVASIVE MUCINOUS ADENOCARCINOMA OF THE RIGHT OVARY, 20 CM. - TUMOR INVOLVES THE OVARIAN SURFACE. - RIGHT FALLOPIAN TUBE IS INVOLVED. - SEE ONCOLOGY TABLE. - SEE NOTE. 2. Soft tissue mass, biopsy, cecal gutter nodule - METASTATIC MUCINOUS ADENOCARCINOMA. 3. Ovary and fallopian tube, left, left ovary and fallopian tube - METASTATIC MUCINOUS ADENOCARCINOMA TO LEFT OVARY AND FALLOPIAN TUBE. 4. Omentum, resection for tumor - METASTATIC MUCINOUS ADENOCARCINOMA TO OMENTUM. 5. Soft tissue mass, biopsy, splenic flexure nodule - METASTATIC MUCINOUS ADENOCARCINOMA. 6. Soft tissue mass, biopsy, cecal implant - METASTATIC MUCINOUS ADENOCARCINOMA. 7. Soft tissue mass, biopsy, right ureteral peritoneal implant - METASTATIC MUCINOUS ADENOCARCINOMA. 8. Mesentery, small bowel nodule - METASTATIC MUCINOUS ADENOCARCINOMA. Microscopic Comment 1. OVARY or FALLOPIAN TUBE or PRIMARY PERITONEUM: Procedure: Bilateral salpingo-oophorectomy. Specimen Integrity: Intact. Tumor Site: Right ovary. Ovarian Surface Involvement: Present. Fallopian Tube Surface Involvement: Present. Tumor Size: 20 cm. Histologic Type: Mucinous adenocarcinoma. Histologic Grade: Overall G2 (moderately differentiated) (focal areas of poor differentiation are present). Implants: Present. Other Tissue/ Organ Involvement: Left ovary, left fallopian tube and omentum. Largest Extrapelvic Peritoneal Focus: less than 2 cm. See note Peritoneal/Ascitic Fluid: N/A Treatment Effect: No definite or minimal response identified (chemotherapy response score 1 [CRS 1]) Regional Lymph Nodes No lymph nodes submitted or found Number of Lymph Nodes Examined: 0 Pathologic Stage Classification (pTNM, AJCC 8th Edition): ypT3b, ypNX. Representative Tumor Block: 1D and 1E. (NDK:gt, 11/15/17)    11/19/2017 Genetic Testing    Negative genetic  testing on the [facility] panel.  The [facility] gene panel offered by [employer] includes sequencing and deletion/duplication testing of the following 35 genes: APC, ATM, AXIN2, BARD1, BMPR1A, BRCA1, BRCA2, BRIP1, CHD1, CDK4, CDKN2A, CHEK2, EPCAM (large rearrangement only), HOXB13, (sequencing only), GALNT12, MLH1, MSH2, MSH3 (excluding repetitive portions of exon 1), MSH6, MUTYH, NBN, NTHL1, PALB2, PMS2, PTEN, RAD51C, RAD51D, RNF43, RPS20, SMAD4, STK11, and TP53. Sequencing was performed for select regions of POLE and POLD1, and large rearrangement analysis was performed for select regions of GREM1. The report date is November 19, 2017.  HRD tumor results indicate a BRCA1 mutation identified in the ovarian tumor causing genomic instability. The report date is 12/04/2017      Genetic Testing    Patient has genetic testing done for ER/PR and Her2/neu. Results revealed patient has the following: ER 15-20% PR 5-10% Her2/neu - negative     Genetic Testing    Patient has genetic testing done for MSI/MMR. Results revealed patient has the following mutation(s): MMR normal, MSI stable    12/10/2017 Imaging    1. Interval resection of large bilateral adnexal cystic masses. Residual soft tissue/tumor within bilateral adnexal regions as above. 2. Interval resolution of previously noted omental cake which may reflect interval omentectomy. There is a new peritoneal deposit identified within the left upper quadrant of the abdomen involving the anterior surface of the spleen.    12/10/2017 Tumor Marker    Patient's tumor was tested for the following markers: CA-125 Results of the tumor marker test revealed 94.9    12/11/2017 - 02/20/2018 Chemotherapy    The patient had FOLFOX regimen for mucinous    01/09/2018 Tumor Marker    Patient's tumor was tested for the following markers: CA-125 Results of the tumor marker test revealed 37    01/31/2018 Imaging    Resolution of peritoneal soft tissue density along the anterior margin of the spleen since prior study. No evidence of  residual or progressive metastatic disease.  Mild sigmoid diverticulosis, without radiographic evidence of diverticulitis. Resolution of small pericolonic abscess along the left lateral pelvic sidewall.  Mild hepatic steatosis.    02/06/2018 Tumor Marker    Patient's tumor was tested for the following markers: CA-125 Results of the tumor marker test revealed 36.7    03/06/2018 -  Chemotherapy    The patient is started on [name] as PARP maintenance    03/13/2018 Tumor Marker    Patient's tumor was tested for the following markers: CA-125 Results of the tumor marker test revealed 33.8    04/05/2018 Tumor Marker    Patient's tumor was tested for the following markers: CA-125 Results of the tumor marker test revealed 27.3    05/03/2018 Tumor Marker    Patient's tumor was tested for the following markers: CA-125 Results of the tumor marker test revealed 23.5    07/08/2018 Tumor Marker    Patient's tumor was tested for the following markers: CA-125 Results of the tumor marker test revealed 19.9     REVIEW OF SYSTEMS:   Constitutional: Denies fevers, chills or abnormal weight loss Eyes: Denies blurriness of vision Ears, nose, mouth, throat, and face: Denies mucositis or sore throat Respiratory: Denies cough, dyspnea or wheezes Cardiovascular: Denies palpitation, chest discomfort or lower extremity swelling Gastrointestinal:  Denies nausea, heartburn or change in bowel habits Skin: Denies abnormal skin rashes Lymphatics: Denies new lymphadenopathy or easy bruising Neurological:Denies numbness, tingling or new weaknesses Behavioral/Psych: Mood is stable, no new changes  All  other systems were reviewed with the patient and are negative.  I have reviewed the past medical history, past surgical history, social history and family history with the patient and they are unchanged from previous note.  ALLERGIES:  has No Known Allergies.  MEDICATIONS:  Current Outpatient Medications   Medication Sig Dispense Refill  . acetaminophen (TYLENOL) 500 MG tablet Take 500 mg by mouth every 8 (eight) hours as needed for mild pain or moderate pain.    Marland Kitchen atenolol (TENORMIN) 25 MG tablet Take 25 mg by mouth at bedtime.     Marland Kitchen CALCIUM-MAGNESIUM-VITAMIN D PO Take 1 tablet by mouth daily.    . famotidine (PEPCID) 20 MG tablet Take 20 mg by mouth as needed for heartburn or indigestion.    . lidocaine-prilocaine (EMLA) cream Apply 1 application topically as needed. 30 g 6  . loratadine (CLARITIN) 10 MG tablet Take 10 mg by mouth daily as needed for allergies.    . metFORMIN (GLUCOPHAGE) 1000 MG tablet Take 500 mg by mouth 2 (two) times daily.   0  . Multiple Vitamin (MULTIVITAMIN WITH MINERALS) TABS tablet Take 1 tablet by mouth daily.    Marland Kitchen olaparib ([name]) 100 MG tablet Take 2 tablets (200 mg total) by mouth 2 (two) times daily. Swallow whole. 120 tablet 11  . ondansetron (ZOFRAN) 8 MG tablet Take 1 tablet (8 mg total) by mouth every 8 (eight) hours as needed for nausea. (Patient not taking: Reported on 04/03/2018) 30 tablet 3  . prochlorperazine (COMPAZINE) 10 MG tablet Take 1 tablet (10 mg total) by mouth every 6 (six) hours as needed for nausea or vomiting. (Patient not taking: Reported on 04/03/2018) 30 tablet 0   No current facility-administered medications for this visit.     PHYSICAL EXAMINATION: ECOG PERFORMANCE STATUS: 0 - Asymptomatic  Vitals:   08/19/18 0950  BP: 125/68  Pulse: 67  Resp: 18  Temp: 98.4 F (36.9 C)  SpO2: 99%   Filed Weights   08/19/18 0950  Weight: 147 lb 9.6 oz (67 kg)    GENERAL:alert, no distress and comfortable SKIN: skin color, texture, turgor are normal, no rashes or significant lesions EYES: normal, Conjunctiva are pink and non-injected, sclera clear OROPHARYNX:no exudate, no erythema and lips, buccal mucosa, and tongue normal  NECK: supple, thyroid normal size, non-tender, without nodularity LYMPH:  no palpable lymphadenopathy in the  cervical, axillary or inguinal LUNGS: clear to auscultation and percussion with normal breathing effort HEART: regular rate & rhythm and no murmurs and no lower extremity edema ABDOMEN:abdomen soft, non-tender and normal bowel sounds Musculoskeletal:no cyanosis of digits and no clubbing  NEURO: alert & oriented x 3 with fluent speech, no focal motor/sensory deficits  LABORATORY DATA:  I have reviewed the data as listed    Component Value Date/Time   NA 143 08/19/2018 0925   K 3.9 08/19/2018 0925   CL 105 08/19/2018 0925   CO2 26 08/19/2018 0925   GLUCOSE 123 (H) 08/19/2018 0925   BUN 14 08/19/2018 0925   CREATININE 1.01 (H) 08/19/2018 0925   CREATININE 0.86 06/05/2014 0957   CALCIUM 9.9 08/19/2018 0925   PROT 7.9 08/19/2018 0925   ALBUMIN 4.0 08/19/2018 0925   AST 18 08/19/2018 0925   ALT 12 08/19/2018 0925   ALKPHOS 73 08/19/2018 0925   BILITOT 0.3 08/19/2018 0925   GFRNONAA 57 (L) 08/19/2018 0925   GFRAA >60 08/19/2018 0925    No results found for: SPEP, UPEP  Lab Results  Component Value  Date   WBC 7.3 08/19/2018   NEUTROABS 4.6 08/19/2018   HGB 13.4 08/19/2018   HCT 40.2 08/19/2018   MCV 106.6 (H) 08/19/2018   PLT 171 08/19/2018      Chemistry      Component Value Date/Time   NA 143 08/19/2018 0925   K 3.9 08/19/2018 0925   CL 105 08/19/2018 0925   CO2 26 08/19/2018 0925   BUN 14 08/19/2018 0925   CREATININE 1.01 (H) 08/19/2018 0925   CREATININE 0.86 06/05/2014 0957      Component Value Date/Time   CALCIUM 9.9 08/19/2018 0925   ALKPHOS 73 08/19/2018 0925   AST 18 08/19/2018 0925   ALT 12 08/19/2018 0925   BILITOT 0.3 08/19/2018 0925      All questions were answered. The patient knows to call the clinic with any problems, questions or concerns. No barriers to learning was detected.  I spent 10 minutes counseling the patient face to face. The total time spent in the appointment was 15 minutes and more than 50% was on counseling and review of test  results  [name], MD 08/19/2018 10:16 AM

## 2018-08-23 ENCOUNTER — Telehealth: Payer: Self-pay

## 2018-08-23 NOTE — Telephone Encounter (Signed)
She called and left a message for recent lab results.  Called back and given lab results on 4/27 per Dr. Alvy Bimler. CA-125 not drawn. She verbalized understanding. Added lab appt to 5/11 appt.

## 2018-08-26 ENCOUNTER — Telehealth: Payer: Self-pay

## 2018-08-26 NOTE — Telephone Encounter (Signed)
Oral Oncology Patient Advocate Encounter  Was successful in securing patient a $3500 grant from [employer] to provide copayment coverage for [employer]. This will keep the out of pocket expense at $0.   I called the patient and left a voicemail.   The billing information is as follows and has been shared with [place].   Member ID: [id number] Group ID: [id number] RxBin: [id number] Dates of Eligibility: 07/27/18 through 07/26/19  [place] Patient [place] Phone [phone] Fax [phone] 08/26/2018    2:55 PM

## 2018-09-02 ENCOUNTER — Inpatient Hospital Stay: Payer: Medicare HMO | Attending: Obstetrics

## 2018-09-02 ENCOUNTER — Other Ambulatory Visit: Payer: Self-pay

## 2018-09-02 ENCOUNTER — Inpatient Hospital Stay: Payer: Medicare HMO

## 2018-09-02 DIAGNOSIS — C561 Malignant neoplasm of right ovary: Secondary | ICD-10-CM | POA: Diagnosis present

## 2018-09-02 DIAGNOSIS — C786 Secondary malignant neoplasm of retroperitoneum and peritoneum: Secondary | ICD-10-CM | POA: Insufficient documentation

## 2018-09-02 DIAGNOSIS — R590 Localized enlarged lymph nodes: Secondary | ICD-10-CM

## 2018-09-02 LAB — CBC WITH DIFFERENTIAL (CANCER CENTER ONLY)
Abs Immature Granulocytes: 0.02 10*3/uL (ref 0.00–0.07)
Basophils Absolute: 0 10*3/uL (ref 0.0–0.1)
Basophils Relative: 1 %
Eosinophils Absolute: 0.1 10*3/uL (ref 0.0–0.5)
Eosinophils Relative: 2 %
HCT: 37.5 % (ref 36.0–46.0)
Hemoglobin: 12.4 g/dL (ref 12.0–15.0)
Immature Granulocytes: 0 %
Lymphocytes Relative: 29 %
Lymphs Abs: 2 10*3/uL (ref 0.7–4.0)
MCH: 35.4 pg — ABNORMAL HIGH (ref 26.0–34.0)
MCHC: 33.1 g/dL (ref 30.0–36.0)
MCV: 107.1 fL — ABNORMAL HIGH (ref 80.0–100.0)
Monocytes Absolute: 0.4 10*3/uL (ref 0.1–1.0)
Monocytes Relative: 5 %
Neutro Abs: 4.4 10*3/uL (ref 1.7–7.7)
Neutrophils Relative %: 63 %
Platelet Count: 166 10*3/uL (ref 150–400)
RBC: 3.5 MIL/uL — ABNORMAL LOW (ref 3.87–5.11)
RDW: 14.6 % (ref 11.5–15.5)
WBC Count: 7 10*3/uL (ref 4.0–10.5)
nRBC: 0 % (ref 0.0–0.2)

## 2018-09-02 LAB — CMP (CANCER CENTER ONLY)
ALT: 13 U/L (ref 0–44)
AST: 18 U/L (ref 15–41)
Albumin: 3.9 g/dL (ref 3.5–5.0)
Alkaline Phosphatase: 67 U/L (ref 38–126)
Anion gap: 11 (ref 5–15)
BUN: 17 mg/dL (ref 8–23)
CO2: 25 mmol/L (ref 22–32)
Calcium: 9.6 mg/dL (ref 8.9–10.3)
Chloride: 105 mmol/L (ref 98–111)
Creatinine: 0.95 mg/dL (ref 0.44–1.00)
GFR, Est AFR Am: >60 mL/min (ref >60)
GFR, Estimated: >60 mL/min (ref >60)
Glucose, Bld: 104 mg/dL — ABNORMAL HIGH (ref 70–99)
Potassium: 4 mmol/L (ref 3.5–5.1)
Sodium: 141 mmol/L (ref 135–145)
Total Bilirubin: 0.3 mg/dL (ref 0.3–1.2)
Total Protein: 7.5 g/dL (ref 6.5–8.1)

## 2018-09-02 MED ORDER — HEPARIN SOD (PORK) LOCK FLUSH 100 UNIT/ML IV SOLN
500.0000 [IU] | Freq: Once | INTRAVENOUS | Status: AC
  Administered 2018-09-02: 500 [IU]
  Filled 2018-09-02: qty 5

## 2018-09-02 MED ORDER — SODIUM CHLORIDE 0.9% FLUSH
10.0000 mL | Freq: Once | INTRAVENOUS | Status: AC
  Administered 2018-09-02: 10 mL
  Filled 2018-09-02: qty 10

## 2018-09-03 ENCOUNTER — Telehealth: Payer: Self-pay | Admitting: Oncology

## 2018-09-03 LAB — CA 125: Cancer Antigen (CA) 125: 17 U/mL (ref 0.0–38.1)

## 2018-09-03 NOTE — Telephone Encounter (Signed)
Called Sabrina Vang and advised her that her lab results from yesterday were ok per Dr. Alvy Bimler.  She verbalized understanding and agreement.

## 2018-09-18 MED FILL — LYNPARZA 100 MG TAB: 100 | 30 days supply | Qty: 120 | Fill #7

## 2018-09-19 ENCOUNTER — Encounter (INDEPENDENT_AMBULATORY_CARE_PROVIDER_SITE_OTHER): Payer: Self-pay | Admitting: *Deleted

## 2018-10-14 ENCOUNTER — Telehealth: Payer: Self-pay | Admitting: *Deleted

## 2018-10-14 NOTE — Telephone Encounter (Signed)
error 

## 2018-10-15 MED FILL — LYNPARZA 100 MG TAB: 100 | 30 days supply | Qty: 120 | Fill #8

## 2018-10-23 ENCOUNTER — Other Ambulatory Visit (INDEPENDENT_AMBULATORY_CARE_PROVIDER_SITE_OTHER): Payer: Self-pay | Admitting: Internal Medicine

## 2018-10-23 ENCOUNTER — Ambulatory Visit (HOSPITAL_COMMUNITY)
Admission: RE | Admit: 2018-10-23 | Discharge: 2018-10-23 | Disposition: A | Discharge status: Home / Self Care | Payer: Medicare HMO | Source: Ambulatory Visit | Attending: Internal Medicine | Admitting: Internal Medicine

## 2018-10-23 ENCOUNTER — Other Ambulatory Visit: Payer: Self-pay

## 2018-10-23 DIAGNOSIS — K8689 Other specified diseases of pancreas: Secondary | ICD-10-CM | POA: Diagnosis not present

## 2018-10-23 DIAGNOSIS — K862 Cyst of pancreas: Secondary | ICD-10-CM

## 2018-10-23 DIAGNOSIS — K760 Fatty (change of) liver, not elsewhere classified: Secondary | ICD-10-CM | POA: Diagnosis not present

## 2018-10-23 LAB — POCT I-STAT CREATININE: Creatinine, Ser: 1 mg/dL (ref 0.44–1.00)

## 2018-10-23 MED ORDER — GADOBUTROL 1 MMOL/ML IV SOLN
7.0000 mL | Freq: Once | INTRAVENOUS | Status: AC | PRN
  Administered 2018-10-23: 7 mL via INTRAVENOUS

## 2018-10-28 ENCOUNTER — Inpatient Hospital Stay: Payer: Medicare HMO | Attending: Obstetrics | Admitting: Hematology and Oncology

## 2018-10-28 ENCOUNTER — Inpatient Hospital Stay: Payer: Medicare HMO

## 2018-10-28 ENCOUNTER — Other Ambulatory Visit: Payer: Self-pay

## 2018-10-28 DIAGNOSIS — R590 Localized enlarged lymph nodes: Secondary | ICD-10-CM

## 2018-10-28 DIAGNOSIS — C786 Secondary malignant neoplasm of retroperitoneum and peritoneum: Secondary | ICD-10-CM | POA: Diagnosis not present

## 2018-10-28 DIAGNOSIS — C561 Malignant neoplasm of right ovary: Secondary | ICD-10-CM | POA: Diagnosis not present

## 2018-10-28 LAB — CMP (CANCER CENTER ONLY)
ALT: 15 U/L (ref 0–44)
AST: 22 U/L (ref 15–41)
Albumin: 3.9 g/dL (ref 3.5–5.0)
Alkaline Phosphatase: 69 U/L (ref 38–126)
Anion gap: 11 (ref 5–15)
BUN: 17 mg/dL (ref 8–23)
CO2: 27 mmol/L (ref 22–32)
Calcium: 9.6 mg/dL (ref 8.9–10.3)
Chloride: 103 mmol/L (ref 98–111)
Creatinine: 0.97 mg/dL (ref 0.44–1.00)
GFR, Est AFR Am: >60 mL/min (ref >60)
GFR, Estimated: >60 mL/min (ref >60)
Glucose, Bld: 87 mg/dL (ref 70–99)
Potassium: 4.1 mmol/L (ref 3.5–5.1)
Sodium: 141 mmol/L (ref 135–145)
Total Bilirubin: 0.4 mg/dL (ref 0.3–1.2)
Total Protein: 7.5 g/dL (ref 6.5–8.1)

## 2018-10-28 LAB — CBC WITH DIFFERENTIAL (CANCER CENTER ONLY)
Abs Immature Granulocytes: 0.02 10*3/uL (ref 0.00–0.07)
Basophils Absolute: 0 10*3/uL (ref 0.0–0.1)
Basophils Relative: 0 %
Eosinophils Absolute: 0.1 10*3/uL (ref 0.0–0.5)
Eosinophils Relative: 2 %
HCT: 35.9 % — ABNORMAL LOW (ref 36.0–46.0)
Hemoglobin: 12.1 g/dL (ref 12.0–15.0)
Immature Granulocytes: 0 %
Lymphocytes Relative: 33 %
Lymphs Abs: 2.4 10*3/uL (ref 0.7–4.0)
MCH: 35.9 pg — ABNORMAL HIGH (ref 26.0–34.0)
MCHC: 33.7 g/dL (ref 30.0–36.0)
MCV: 106.5 fL — ABNORMAL HIGH (ref 80.0–100.0)
Monocytes Absolute: 0.6 10*3/uL (ref 0.1–1.0)
Monocytes Relative: 8 %
Neutro Abs: 4.2 10*3/uL (ref 1.7–7.7)
Neutrophils Relative %: 57 %
Platelet Count: 188 10*3/uL (ref 150–400)
RBC: 3.37 MIL/uL — ABNORMAL LOW (ref 3.87–5.11)
RDW: 14.3 % (ref 11.5–15.5)
WBC Count: 7.4 10*3/uL (ref 4.0–10.5)
nRBC: 0.3 % — ABNORMAL HIGH (ref 0.0–0.2)

## 2018-10-28 MED ORDER — SODIUM CHLORIDE 0.9% FLUSH
10.0000 mL | Freq: Once | INTRAVENOUS | Status: AC
  Administered 2018-10-28: 10 mL
  Filled 2018-10-28: qty 10

## 2018-10-28 MED ORDER — HEPARIN SOD (PORK) LOCK FLUSH 100 UNIT/ML IV SOLN
500.0000 [IU] | Freq: Once | INTRAVENOUS | Status: AC
  Administered 2018-10-28: 500 [IU]
  Filled 2018-10-28: qty 5

## 2018-10-28 MED ORDER — LIDOCAINE-PRILOCAINE 2.5-2.5 % EX CREA: 1.0000 "application " | TOPICAL_CREAM | CUTANEOUS | 6 refills | Status: DC | PRN

## 2018-10-29 ENCOUNTER — Telehealth: Payer: Self-pay

## 2018-10-29 ENCOUNTER — Encounter: Payer: Self-pay | Admitting: Hematology and Oncology

## 2018-10-29 ENCOUNTER — Telehealth: Payer: Self-pay | Admitting: Hematology and Oncology

## 2018-10-29 LAB — CA 125: Cancer Antigen (CA) 125: 16.9 U/mL (ref 0.0–38.1)

## 2018-10-29 NOTE — Assessment & Plan Note (Signed)
She tolerated olaparib well without side effects Her blood counts are within normal limits Her tumor markers are stable/within normal range She will continue follow-up every 3 months and port flushes  We discussed the signs and symptoms to watch out for.   She had recent MRI, results pending We will call her with test results If MRI is unrevealing, I will consider not performing CT imaging at the end of the year

## 2018-10-29 NOTE — Telephone Encounter (Signed)
Spoke with pt by phone and given below msg.

## 2018-10-29 NOTE — Telephone Encounter (Signed)
-----   Message from [name], MD sent at 10/29/2018  9:44 AM EDT ----- Regarding: RE: MRI last week Results are excellent They also reported no abnormal findings elsewhere in her abdomen CA-125 is also good All good reports. Please call her and let her know Scheduler will call her for appt ----- Message ----- From: Paulla Dolly, RN Sent: 10/29/2018   9:35 AM EDT To: [name], MD Subject: RE: MRI last week                              Mri report is available ----- Message ----- From: [name], MD Sent: 10/29/2018   7:19 AM EDT To: Jacqulyn Liner, RN, Paulla Dolly, RN Subject: MRI last week                                  She had MRI last week, no results available Can you call radiology to release the results?

## 2018-10-29 NOTE — Telephone Encounter (Signed)
I talk with patient regarding schedule  

## 2018-10-29 NOTE — Progress Notes (Signed)
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   Sabrina Vang had recent MRI, results pending We will call her with test results If MRI is unrevealing, I will consider not performing CT imaging at the end of the year   No orders of the defined types were placed in this encounter.   INTERVAL HISTORY: Please see below for problem oriented charting. 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 The left adnexa measured 12.7 x 11.6 x 8.1 with low level echoes and soft tissue nodules    07/30/2017 Tumor Marker   Patient's tumor was tested for the following markers: CA-125 Results of the tumor marker test revealed 521.3   08/17/2017 Pathology Results   The malignant cells are positive for PAX-8, cytokeratin 7, estrogen receptor, and faintly positive for GATA-3. They are negative for p53, GCDFP, and cytokeratin 20. The finding are consistent with a gynecologic primary carcinoma. Additional studies can be performed upon clinician request.   08/17/2017 Procedure   Technically successful  CT-guided left lower quadrant omental mass core biopsy.   08/20/2017 PET scan   1. Cystic masses arising from the pelvis. The nodular component of the RIGHT cystic mass is intensely hypermetabolic consistent with malignant ovarian neoplasm. 2. Extensive hypermetabolic peritoneal thickening in the lower abdomen and upper pelvis, upper abdomen, and upper abdominal precordial fat and paradiaphragmatic fat. 3. Retroperitoneal nodal metastasis adjacent to the IVC at the level the kidneys. 4. No evidence of metastatic disease in the thorax other small effusion on the LEFT and nodal metastasis in the fat superior to the diaphragm. 5. Mild metabolic activity associated the distal esophagus is favored benign esophagitis.   08/20/2017 Imaging   CT chest 1. Bilateral cardiophrenic angle nodal metastasis. No additional findings to suggest metastatic disease to the chest. 2. Small left pleural effusion. 3. Peritoneal carcinomatosis noted within the abdomen. 4. Hepatic steatosis.    08/24/2017 Cancer Staging   Staging form: Ovary, Fallopian Tube, and Primary Peritoneal Carcinoma, AJCC 8th Edition - Clinical: Stage IV (cT3, cN1, cM1) - Signed by [name], MD on 08/24/2017   08/31/2017 Tumor Marker   Patient's tumor was tested for the following markers: CA-125 Results of the tumor marker test revealed 819.9   09/26/2017 Adverse Reaction   Sabrina Vang developed reaction to Taxol, managed successfully with additional premedications   10/17/2017 Tumor Marker   Patient's tumor was tested for the following markers: CA-125 Results of the tumor marker test revealed 374.4   10/31/2017 Imaging   1. No significant change in size of the large complex bilateral adnexal masses consistent with the known history of  ovarian cancer. There is increased dependent density within the left-sided lesion. 2. Stable peritoneal carcinomatosis.  No significant ascites.  3. The bilateral pericardiac adenopathy has improved. No progressive  thoracic metastatic disease. No pulmonary or osseous metastatic disease. 4. Suspicion of nonocclusive thrombus in the right internal jugular vein related to the right IJ port. Recommend further evaluation with Doppler ultrasound.   11/13/2017 Surgery   Preoperative Diagnosis: Ovarian cancer s/p neoadjuvant chemotherapy   Procedure(s) Performed:   1. Exploratory laparotomy  2. Bilateral salpingo-oophorectomy with radical tumor debulking for ovarian cancer   including retroperitoneal dissection, lysis of adhesions (enterolysis of small bowel in deep pelvis, left adnexal adhesions to sigmoid and omentum to LLQ) ~1 hour, ureterolysis. 3. Omentectomy  4. Takedown of splenic flexure with removal of omental tumor in left upper quadrant  Specimens: Bilateral tubes and ovaries, omentum, splenic flexure nodule, right paracolic gutter nodule, cecal nodule, right ureteral peritoneal nodule, small bowel mesentary.  Indication for Procedure:  Patient is s/p 3 cycles of chemotherapy with response based on CA125 and decreased mediastinal disease (to <1cm).  Operative Findings:  This represented an optimal cytoreduction with gross residual disease remaining in the deep pelvis near the levator floor and possibly in the region of the right IP ligament/periappendicial region.  Upon entry a large ~20cm right tube/ovary, mostly cystic. Adherent rind along ileocecal region and appendix. Large cystic left tube/ovary ~10cm with sigmoid colon stretched across the mass and extension of the cystic lesion into the deep pelvis with residual palpable disease deep pelvis near levators. Estimate ~1cm or less on palpation, not visible disease. Omental caking in the LLQ adherent to pelvic sidewall. Omental disease noted RUQ separate. In addition ~1.5cm lesion in splenic flexure omentum. Evidence of prior diaphragmatic disease, no visible disease.      11/13/2017 Pathology Results   1. Ovary and fallopian tube,  right - INVASIVE MUCINOUS ADENOCARCINOMA OF THE RIGHT OVARY, 20 CM. - TUMOR INVOLVES THE OVARIAN SURFACE. - RIGHT FALLOPIAN TUBE IS INVOLVED. - SEE ONCOLOGY TABLE. - SEE NOTE. 2. Soft tissue mass, biopsy, cecal gutter nodule - METASTATIC MUCINOUS ADENOCARCINOMA. 3. Ovary and fallopian tube, left, left ovary and fallopian tube - METASTATIC MUCINOUS ADENOCARCINOMA TO LEFT OVARY AND FALLOPIAN TUBE. 4. Omentum, resection for tumor - METASTATIC MUCINOUS ADENOCARCINOMA TO OMENTUM. 5. Soft tissue mass, biopsy, splenic flexure nodule - METASTATIC MUCINOUS ADENOCARCINOMA. 6. Soft tissue mass, biopsy, cecal implant - METASTATIC MUCINOUS ADENOCARCINOMA. 7. Soft tissue mass, biopsy, right ureteral peritoneal implant - METASTATIC MUCINOUS ADENOCARCINOMA. 8. Mesentery, small bowel nodule - METASTATIC MUCINOUS ADENOCARCINOMA. Microscopic Comment 1. OVARY or FALLOPIAN TUBE or PRIMARY PERITONEUM: Procedure: Bilateral salpingo-oophorectomy. Specimen Integrity: Intact. Tumor Site: Right ovary. Ovarian Surface Involvement: Present. Fallopian Tube Surface Involvement: Present. Tumor Size: 20 cm. Histologic Type: Mucinous adenocarcinoma. Histologic Grade: Overall G2 (moderately differentiated) (focal areas of poor differentiation are present). Implants: Present. Other Tissue/ Organ Involvement: Left ovary, left fallopian tube and omentum. Largest Extrapelvic Peritoneal Focus: less than 2 cm. See note Peritoneal/Ascitic Fluid: N/A Treatment Effect: No definite or minimal response identified (chemotherapy response score 1 [CRS 1]) Regional Lymph Nodes No lymph nodes submitted or found Number of Lymph Nodes Examined: 0 Pathologic Stage Classification (pTNM, AJCC 8th Edition): ypT3b, ypNX. Representative Tumor Block: 1D and 1E. (NDK:gt, 11/15/17)   11/19/2017 Genetic Testing   Negative genetic testing on the [employer] Myrisk panel.  The [facility] gene panel offered by [employer] includes  sequencing and deletion/duplication testing of the following 35 genes: APC, ATM, AXIN2,  BARD1, BMPR1A, BRCA1, BRCA2, BRIP1, CHD1, CDK4, CDKN2A, CHEK2, EPCAM (large rearrangement only), HOXB13, (sequencing only), GALNT12, MLH1, MSH2, MSH3 (excluding repetitive portions of exon 1), MSH6, MUTYH, NBN, NTHL1, PALB2, PMS2, PTEN, RAD51C, RAD51D, RNF43, RPS20, SMAD4, STK11, and TP53. Sequencing was performed for select regions of POLE and POLD1, and large rearrangement analysis was performed for select regions of GREM1. The report date is November 19, 2017.  HRD tumor results indicate a BRCA1 mutation identified in the ovarian tumor causing genomic instability. The report date is 12/04/2017     Genetic Testing   Patient has genetic testing done for ER/PR and Her2/neu. Results revealed patient has the following: ER 15-20% PR 5-10% Her2/neu - negative    Genetic Testing   Patient has genetic testing done for MSI/MMR. Results revealed patient has the following mutation(s): MMR normal, MSI stable   12/10/2017 Imaging   1. Interval resection of large bilateral adnexal cystic masses. Residual soft tissue/tumor within bilateral adnexal regions as above. 2. Interval resolution of previously noted omental cake which may reflect interval omentectomy. There is a new peritoneal deposit identified within the left upper quadrant of the abdomen involving the anterior surface of the spleen.   12/10/2017 Tumor Marker   Patient's tumor was tested for the following markers: CA-125 Results of the tumor marker test revealed 94.9   12/11/2017 - 02/20/2018 Chemotherapy   The patient had FOLFOX regimen for mucinous   01/09/2018 Tumor Marker   Patient's tumor was tested for the following markers: CA-125 Results of the tumor marker test revealed 37   01/31/2018 Imaging   Resolution of peritoneal soft tissue density along the anterior margin of the spleen since prior study. No evidence of residual or progressive metastatic  disease.  Mild sigmoid diverticulosis, without radiographic evidence of diverticulitis. Resolution of small pericolonic abscess along the left lateral pelvic sidewall.  Mild hepatic steatosis.   02/06/2018 Tumor Marker   Patient's tumor was tested for the following markers: CA-125 Results of the tumor marker test revealed 36.7   03/06/2018 -  Chemotherapy   The patient is started on [name] as PARP maintenance   03/13/2018 Tumor Marker   Patient's tumor was tested for the following markers: CA-125 Results of the tumor marker test revealed 33.8   04/05/2018 Tumor Marker   Patient's tumor was tested for the following markers: CA-125 Results of the tumor marker test revealed 27.3   05/03/2018 Tumor Marker   Patient's tumor was tested for the following markers: CA-125 Results of the tumor marker test revealed 23.5   07/08/2018 Tumor Marker   Patient's tumor was tested for the following markers: CA-125 Results of the tumor marker test revealed 19.9   09/02/2018 Tumor Marker   Patient's tumor was tested for the following markers: CA-125 Results of the tumor marker test revealed 17     REVIEW OF SYSTEMS:   Constitutional: Denies fevers, chills or abnormal weight loss Eyes: Denies blurriness of vision Ears, nose, mouth, throat, and face: Denies mucositis or sore throat Respiratory: Denies cough, dyspnea or wheezes Cardiovascular: Denies palpitation, chest discomfort or lower extremity swelling Gastrointestinal:  Denies nausea, heartburn or change in bowel habits Skin: Denies abnormal skin rashes Lymphatics: Denies new lymphadenopathy or easy bruising Neurological:Denies numbness, tingling or new weaknesses Behavioral/Psych: Mood is stable, no new changes  All other systems were reviewed with the patient and are negative.  I have reviewed the past medical history, past surgical history, social history and family history with the patient and they are  unchanged from previous  note.  ALLERGIES:  has No Known Allergies.  MEDICATIONS:  Current Outpatient Medications  Medication Sig Dispense Refill  . acetaminophen (TYLENOL) 500 MG tablet Take 500 mg by mouth every 8 (eight) hours as needed for mild pain or moderate pain.    Marland Kitchen atenolol (TENORMIN) 25 MG tablet Take 25 mg by mouth at bedtime.     Marland Kitchen CALCIUM-MAGNESIUM-VITAMIN D PO Take 1 tablet by mouth daily.    . famotidine (PEPCID) 20 MG tablet Take 20 mg by mouth as needed for heartburn or indigestion.    . lidocaine-prilocaine (EMLA) cream Apply 1 application topically as needed. 30 g 6  . loratadine (CLARITIN) 10 MG tablet Take 10 mg by mouth daily as needed for allergies.    . metFORMIN (GLUCOPHAGE) 1000 MG tablet Take 500 mg by mouth 2 (two) times daily.   0  . Multiple Vitamin (MULTIVITAMIN WITH MINERALS) TABS tablet Take 1 tablet by mouth daily.    Marland Kitchen olaparib ([name]) 100 MG tablet Take 2 tablets (200 mg total) by mouth 2 (two) times daily. Swallow whole. 120 tablet 11  . ondansetron (ZOFRAN) 8 MG tablet Take 1 tablet (8 mg total) by mouth every 8 (eight) hours as needed for nausea. (Patient not taking: Reported on 04/03/2018) 30 tablet 3  . prochlorperazine (COMPAZINE) 10 MG tablet Take 1 tablet (10 mg total) by mouth every 6 (six) hours as needed for nausea or vomiting. (Patient not taking: Reported on 04/03/2018) 30 tablet 0   No current facility-administered medications for this visit.     PHYSICAL EXAMINATION: ECOG PERFORMANCE STATUS: 0 - Asymptomatic  Vitals:   10/28/18 1337  BP: (!) 126/59  Pulse: 80  Resp: 18  Temp: 98.2 F (36.8 C)  SpO2: 98%   Filed Weights   10/28/18 1337  Weight: 151 lb 12.8 oz (68.9 kg)    GENERAL:alert, no distress and comfortable SKIN: skin color, texture, turgor are normal, no rashes or significant lesions EYES: normal, Conjunctiva are pink and non-injected, sclera clear OROPHARYNX:no exudate, no erythema and lips, buccal mucosa, and tongue normal  NECK:  supple, thyroid normal size, non-tender, without nodularity LYMPH:  no palpable lymphadenopathy in the cervical, axillary or inguinal LUNGS: clear to auscultation and percussion with normal breathing effort HEART: regular rate & rhythm and no murmurs and no lower extremity edema ABDOMEN:abdomen soft, non-tender and normal bowel sounds Musculoskeletal:no cyanosis of digits and no clubbing  NEURO: alert & oriented x 3 with fluent speech, no focal motor/sensory deficits  LABORATORY DATA:  I have reviewed the data as listed    Component Value Date/Time   NA 141 10/28/2018 1235   K 4.1 10/28/2018 1235   CL 103 10/28/2018 1235   CO2 27 10/28/2018 1235   GLUCOSE 87 10/28/2018 1235   BUN 17 10/28/2018 1235   CREATININE 0.97 10/28/2018 1235   CREATININE 0.86 06/05/2014 0957   CALCIUM 9.6 10/28/2018 1235   PROT 7.5 10/28/2018 1235   ALBUMIN 3.9 10/28/2018 1235   AST 22 10/28/2018 1235   ALT 15 10/28/2018 1235   ALKPHOS 69 10/28/2018 1235   BILITOT 0.4 10/28/2018 1235   GFRNONAA >60 10/28/2018 1235   GFRAA >60 10/28/2018 1235    No results found for: SPEP, UPEP  Lab Results  Component Value Date   WBC 7.4 10/28/2018   NEUTROABS 4.2 10/28/2018   HGB 12.1 10/28/2018   HCT 35.9 (L) 10/28/2018   MCV 106.5 (H) 10/28/2018   PLT 188 10/28/2018  Chemistry      Component Value Date/Time   NA 141 10/28/2018 1235   K 4.1 10/28/2018 1235   CL 103 10/28/2018 1235   CO2 27 10/28/2018 1235   BUN 17 10/28/2018 1235   CREATININE 0.97 10/28/2018 1235   CREATININE 0.86 06/05/2014 0957      Component Value Date/Time   CALCIUM 9.6 10/28/2018 1235   ALKPHOS 69 10/28/2018 1235   AST 22 10/28/2018 1235   ALT 15 10/28/2018 1235   BILITOT 0.4 10/28/2018 1235       All questions were answered. The patient knows to call the clinic with any problems, questions or concerns. No barriers to learning was detected.  I spent 15 minutes counseling the patient face to face. The total time spent  in the appointment was 20 minutes and more than 50% was on counseling and review of test results  [name], MD 10/29/2018 7:23 AM

## 2018-11-20 MED FILL — LYNPARZA 100 MG TAB: 100 | 30 days supply | Qty: 120 | Fill #9

## 2018-12-09 ENCOUNTER — Other Ambulatory Visit: Payer: Self-pay

## 2018-12-09 ENCOUNTER — Inpatient Hospital Stay: Payer: Medicare HMO | Attending: Obstetrics

## 2018-12-09 DIAGNOSIS — C561 Malignant neoplasm of right ovary: Secondary | ICD-10-CM

## 2018-12-09 DIAGNOSIS — Z452 Encounter for adjustment and management of vascular access device: Secondary | ICD-10-CM | POA: Diagnosis not present

## 2018-12-09 DIAGNOSIS — Z8543 Personal history of malignant neoplasm of ovary: Secondary | ICD-10-CM | POA: Insufficient documentation

## 2018-12-09 MED ORDER — HEPARIN SOD (PORK) LOCK FLUSH 100 UNIT/ML IV SOLN
250.0000 [IU] | Freq: Once | INTRAVENOUS | Status: AC
  Administered 2018-12-09: 250 [IU]
  Filled 2018-12-09: qty 5

## 2018-12-09 MED ORDER — SODIUM CHLORIDE 0.9% FLUSH
10.0000 mL | Freq: Once | INTRAVENOUS | Status: AC
  Administered 2018-12-09: 10 mL
  Filled 2018-12-09: qty 10

## 2018-12-23 ENCOUNTER — Telehealth: Payer: Self-pay | Admitting: Oncology

## 2018-12-23 NOTE — Telephone Encounter (Signed)
Adilynn called and said she has changed her PCP since her last doctor has retired.  She will be seeing Dr. Carloyn Manner O'Fagan in [place].    She also wants to check to see if she needs a CT scan in October.  She remembers Dr. Alvy Bimler saying that at her last office visit and it has not been scheduled yet.

## 2018-12-23 NOTE — Telephone Encounter (Signed)
Her recent MRI is negative I am not too keen to order Ct in October Let's keep all her appt as is for now and I will discuss with her in Nov. Maybe we can delay her scan until end of the year

## 2018-12-23 NOTE — Telephone Encounter (Signed)
Called Sabrina Vang and advised her of message below from Dr. Alvy Bimler.  She verbalized understanding and agreement.

## 2018-12-25 MED FILL — LYNPARZA 100 MG TAB: 100 | 30 days supply | Qty: 120 | Fill #10

## 2018-12-27 ENCOUNTER — Telehealth: Payer: Self-pay

## 2018-12-27 NOTE — Telephone Encounter (Signed)
Called and given below message. She verbalized understanding. 

## 2018-12-27 NOTE — Telephone Encounter (Signed)
Her symptoms are unrelated to [place] I think she should see her PCP just to make sure this is not shingles

## 2018-12-27 NOTE — Telephone Encounter (Signed)
She called back and left a message that she does have shingles. Her PCP is starting her on a medication today.

## 2018-12-27 NOTE — Telephone Encounter (Signed)
She called and left a message to call her.  Called back. Wednesday she developed blister like red areas to right ear inside and outside. They areas have now developed on right side of mouth and inside of her mouth. The areas look like they are scabbed with some drainage. She is complaining of some right ear pain down to her jaw line at times, with some itching. She took Claritin Wednesday night and applied antibiotic ointment.  She is not sure of if it is a reaction to medication or bug bite. Does she need appointment?

## 2019-01-16 ENCOUNTER — Telehealth: Payer: Self-pay | Admitting: Oncology

## 2019-01-16 NOTE — Telephone Encounter (Signed)
Sabrina Vang called and asked if she can get a flu shot at her port flush appointment on 01/20/19.  Advised her that she can a note will be added to her appointment.

## 2019-01-20 ENCOUNTER — Inpatient Hospital Stay: Payer: Medicare HMO | Attending: Obstetrics

## 2019-01-20 ENCOUNTER — Other Ambulatory Visit: Payer: Self-pay

## 2019-01-20 VITALS — BP 133/72 | HR 68 | Temp 98.9°F | Resp 16

## 2019-01-20 DIAGNOSIS — C561 Malignant neoplasm of right ovary: Secondary | ICD-10-CM

## 2019-01-20 DIAGNOSIS — Z452 Encounter for adjustment and management of vascular access device: Secondary | ICD-10-CM | POA: Insufficient documentation

## 2019-01-20 DIAGNOSIS — Z23 Encounter for immunization: Secondary | ICD-10-CM | POA: Insufficient documentation

## 2019-01-20 DIAGNOSIS — Z8543 Personal history of malignant neoplasm of ovary: Secondary | ICD-10-CM | POA: Diagnosis not present

## 2019-01-20 MED ORDER — HEPARIN SOD (PORK) LOCK FLUSH 100 UNIT/ML IV SOLN
250.0000 [IU] | Freq: Once | INTRAVENOUS | Status: AC
  Administered 2019-01-20: 500 [IU]
  Filled 2019-01-20: qty 5

## 2019-01-20 MED ORDER — SODIUM CHLORIDE 0.9% FLUSH
10.0000 mL | Freq: Once | INTRAVENOUS | Status: AC
  Administered 2019-01-20: 10 mL
  Filled 2019-01-20: qty 10

## 2019-01-20 MED ORDER — INFLUENZA VAC A&B SA ADJ QUAD 0.5 ML IM PRSY
0.5000 mL | PREFILLED_SYRINGE | Freq: Once | INTRAMUSCULAR | Status: AC
  Administered 2019-01-20: 0.5 mL via INTRAMUSCULAR

## 2019-01-20 MED ORDER — INFLUENZA VAC A&B SA ADJ QUAD 0.5 ML IM PRSY
PREFILLED_SYRINGE | INTRAMUSCULAR | Status: AC
  Filled 2019-01-20: qty 0.5

## 2019-01-20 NOTE — Patient Instructions (Signed)
Influenza Virus Vaccine (Flucelvax) What is this medicine? INFLUENZA VIRUS VACCINE (in floo EN zuh VAHY ruhs vak SEEN) helps to reduce the risk of getting influenza also known as the flu. The vaccine only helps protect you against some strains of the flu. This medicine may be used for other purposes; ask your health care provider or pharmacist if you have questions. COMMON BRAND NAME(S): FLUCELVAX What should I tell my health care provider before I take this medicine? They need to know if you have any of these conditions:  bleeding disorder like hemophilia  fever or infection  Guillain-Barre syndrome or other neurological problems  immune system problems  infection with the human immunodeficiency virus (HIV) or AIDS  low blood platelet counts  multiple sclerosis  an unusual or allergic reaction to influenza virus vaccine, other medicines, foods, dyes or preservatives  pregnant or trying to get pregnant  breast-feeding How should I use this medicine? This vaccine is for injection into a muscle. It is given by a health care professional. A copy of Vaccine Information Statements will be given before each vaccination. Read this sheet carefully each time. The sheet may change frequently. Talk to your pediatrician regarding the use of this medicine in children. Special care may be needed. Overdosage: If you think you've taken too much of this medicine contact a poison control center or emergency room at once. Overdosage: If you think you have taken too much of this medicine contact a poison control center or emergency room at once. NOTE: This medicine is only for you. Do not share this medicine with others. What if I miss a dose? This does not apply. What may interact with this medicine?  chemotherapy or radiation therapy  medicines that lower your immune system like etanercept, anakinra, infliximab, and adalimumab  medicines that treat or prevent blood clots like  warfarin  phenytoin  steroid medicines like prednisone or cortisone  theophylline  vaccines This list may not describe all possible interactions. Give your health care provider a list of all the medicines, herbs, non-prescription drugs, or dietary supplements you use. Also tell them if you smoke, drink alcohol, or use illegal drugs. Some items may interact with your medicine. What should I watch for while using this medicine? Report any side effects that do not go away within 3 days to your doctor or health care professional. Call your health care provider if any unusual symptoms occur within 6 weeks of receiving this vaccine. You may still catch the flu, but the illness is not usually as bad. You cannot get the flu from the vaccine. The vaccine will not protect against colds or other illnesses that may cause fever. The vaccine is needed every year. What side effects may I notice from receiving this medicine? Side effects that you should report to your doctor or health care professional as soon as possible:  allergic reactions like skin rash, itching or hives, swelling of the face, lips, or tongue Side effects that usually do not require medical attention (Report these to your doctor or health care professional if they continue or are bothersome.):  fever  headache  muscle aches and pains  pain, tenderness, redness, or swelling at the injection site  tiredness This list may not describe all possible side effects. Call your doctor for medical advice about side effects. You may report side effects to FDA at 1-800-FDA-1088. Where should I keep my medicine? The vaccine will be given by a health care professional in a clinic, pharmacy, doctor's   office, or other health care setting. You will not be given vaccine doses to store at home. NOTE: This sheet is a summary. It may not cover all possible information. If you have questions about this medicine, talk to your doctor, pharmacist, or  health care provider.  2020 Elsevier/Gold Standard (2011-03-22 14:06:47)  

## 2019-01-22 DIAGNOSIS — Z79899 Other long term (current) drug therapy: Secondary | ICD-10-CM | POA: Diagnosis not present

## 2019-01-22 DIAGNOSIS — I10 Essential (primary) hypertension: Secondary | ICD-10-CM | POA: Diagnosis not present

## 2019-01-22 DIAGNOSIS — E1129 Type 2 diabetes mellitus with other diabetic kidney complication: Secondary | ICD-10-CM | POA: Diagnosis not present

## 2019-01-22 DIAGNOSIS — Z0001 Encounter for general adult medical examination with abnormal findings: Secondary | ICD-10-CM | POA: Diagnosis not present

## 2019-01-22 DIAGNOSIS — C561 Malignant neoplasm of right ovary: Secondary | ICD-10-CM | POA: Diagnosis not present

## 2019-01-22 DIAGNOSIS — E785 Hyperlipidemia, unspecified: Secondary | ICD-10-CM | POA: Diagnosis not present

## 2019-01-24 MED FILL — LYNPARZA 100 MG TAB: 100 | 30 days supply | Qty: 120 | Fill #11

## 2019-01-29 DIAGNOSIS — E785 Hyperlipidemia, unspecified: Secondary | ICD-10-CM | POA: Diagnosis not present

## 2019-01-29 DIAGNOSIS — E1122 Type 2 diabetes mellitus with diabetic chronic kidney disease: Secondary | ICD-10-CM | POA: Diagnosis not present

## 2019-01-29 DIAGNOSIS — B009 Herpesviral infection, unspecified: Secondary | ICD-10-CM | POA: Diagnosis not present

## 2019-01-29 DIAGNOSIS — I10 Essential (primary) hypertension: Secondary | ICD-10-CM | POA: Diagnosis not present

## 2019-01-29 DIAGNOSIS — C569 Malignant neoplasm of unspecified ovary: Secondary | ICD-10-CM | POA: Diagnosis not present

## 2019-01-29 DIAGNOSIS — R7309 Other abnormal glucose: Secondary | ICD-10-CM | POA: Diagnosis not present

## 2019-01-30 ENCOUNTER — Telehealth: Payer: Self-pay | Admitting: Oncology

## 2019-01-30 NOTE — Telephone Encounter (Signed)
Sabrina Vang called and said she saw Dr. Willey Blade (new PCP) and he has started her on atorvastatin 20 mg daily.  He is also wondering if she can have a mammogram.  Her last was done in 2018.

## 2019-01-31 NOTE — Telephone Encounter (Signed)
Called Sabrina Vang back and advised her that she can get a mammogram.

## 2019-01-31 NOTE — Telephone Encounter (Signed)
pls proceed and update her med list

## 2019-02-03 ENCOUNTER — Other Ambulatory Visit (HOSPITAL_COMMUNITY): Payer: Self-pay | Admitting: Internal Medicine

## 2019-02-03 DIAGNOSIS — Z1231 Encounter for screening mammogram for malignant neoplasm of breast: Secondary | ICD-10-CM

## 2019-02-07 ENCOUNTER — Telehealth: Payer: Self-pay

## 2019-02-07 NOTE — Telephone Encounter (Signed)
Oral Oncology Patient Advocate Encounter   Was successful in securing patient a $5000 grant from [employer] to provide copayment coverage for [name].  This will keep the out of pocket expense at $0.     I have spoken with the patient.    The billing information is as follows and has been shared with [place].   Member ID: [id number] Group ID: [id number] RxBin: [id number] PCN: [id number] Dates of Eligibility: 02/06/19 through 02/06/20  Fund name:  [employer]  Sabrina Vang Patient [place] Phone [phone] Fax [phone] 02/07/2019    1:20 PM

## 2019-02-12 ENCOUNTER — Ambulatory Visit (HOSPITAL_COMMUNITY)
Admission: RE | Admit: 2019-02-12 | Discharge: 2019-02-12 | Disposition: A | Discharge status: Home / Self Care | Payer: Medicare HMO | Source: Ambulatory Visit | Attending: Internal Medicine | Admitting: Internal Medicine

## 2019-02-12 ENCOUNTER — Other Ambulatory Visit: Payer: Self-pay

## 2019-02-12 DIAGNOSIS — Z1231 Encounter for screening mammogram for malignant neoplasm of breast: Secondary | ICD-10-CM | POA: Insufficient documentation

## 2019-02-17 ENCOUNTER — Other Ambulatory Visit: Payer: Self-pay | Admitting: Hematology and Oncology

## 2019-02-17 DIAGNOSIS — C561 Malignant neoplasm of right ovary: Secondary | ICD-10-CM

## 2019-02-19 MED FILL — LYNPARZA 100 MG TAB: 100 | 30 days supply | Qty: 120 | Fill #0

## 2019-03-03 ENCOUNTER — Inpatient Hospital Stay: Payer: Medicare HMO | Attending: Obstetrics

## 2019-03-03 ENCOUNTER — Inpatient Hospital Stay: Payer: Medicare HMO | Admitting: Hematology and Oncology

## 2019-03-03 ENCOUNTER — Encounter: Payer: Self-pay | Admitting: Hematology and Oncology

## 2019-03-03 ENCOUNTER — Other Ambulatory Visit: Payer: Self-pay

## 2019-03-03 ENCOUNTER — Telehealth: Payer: Self-pay | Admitting: Hematology and Oncology

## 2019-03-03 ENCOUNTER — Inpatient Hospital Stay: Payer: Medicare HMO

## 2019-03-03 VITALS — BP 134/80 | HR 70 | Temp 97.9°F | Resp 17 | Ht 65.0 in | Wt 157.5 lb

## 2019-03-03 DIAGNOSIS — C561 Malignant neoplasm of right ovary: Secondary | ICD-10-CM

## 2019-03-03 DIAGNOSIS — R590 Localized enlarged lymph nodes: Secondary | ICD-10-CM

## 2019-03-03 DIAGNOSIS — D61818 Other pancytopenia: Secondary | ICD-10-CM | POA: Insufficient documentation

## 2019-03-03 DIAGNOSIS — K862 Cyst of pancreas: Secondary | ICD-10-CM | POA: Diagnosis not present

## 2019-03-03 DIAGNOSIS — K760 Fatty (change of) liver, not elsewhere classified: Secondary | ICD-10-CM | POA: Diagnosis not present

## 2019-03-03 DIAGNOSIS — Z1501 Genetic susceptibility to malignant neoplasm of breast: Secondary | ICD-10-CM | POA: Diagnosis not present

## 2019-03-03 DIAGNOSIS — Z1509 Genetic susceptibility to other malignant neoplasm: Secondary | ICD-10-CM | POA: Diagnosis not present

## 2019-03-03 DIAGNOSIS — Z8543 Personal history of malignant neoplasm of ovary: Secondary | ICD-10-CM | POA: Diagnosis not present

## 2019-03-03 LAB — CBC WITH DIFFERENTIAL (CANCER CENTER ONLY)
Abs Immature Granulocytes: 0.02 10*3/uL (ref 0.00–0.07)
Basophils Absolute: 0 10*3/uL (ref 0.0–0.1)
Basophils Relative: 1 %
Eosinophils Absolute: 0.1 10*3/uL (ref 0.0–0.5)
Eosinophils Relative: 1 %
HCT: 35.6 % — ABNORMAL LOW (ref 36.0–46.0)
Hemoglobin: 11.8 g/dL — ABNORMAL LOW (ref 12.0–15.0)
Immature Granulocytes: 0 %
Lymphocytes Relative: 27 %
Lymphs Abs: 1.9 10*3/uL (ref 0.7–4.0)
MCH: 36 pg — ABNORMAL HIGH (ref 26.0–34.0)
MCHC: 33.1 g/dL (ref 30.0–36.0)
MCV: 108.5 fL — ABNORMAL HIGH (ref 80.0–100.0)
Monocytes Absolute: 0.4 10*3/uL (ref 0.1–1.0)
Monocytes Relative: 6 %
Neutro Abs: 4.6 10*3/uL (ref 1.7–7.7)
Neutrophils Relative %: 65 %
Platelet Count: 157 10*3/uL (ref 150–400)
RBC: 3.28 MIL/uL — ABNORMAL LOW (ref 3.87–5.11)
RDW: 14.6 % (ref 11.5–15.5)
WBC Count: 7 10*3/uL (ref 4.0–10.5)
nRBC: 0.3 % — ABNORMAL HIGH (ref 0.0–0.2)

## 2019-03-03 LAB — CMP (CANCER CENTER ONLY)
ALT: 15 U/L (ref 0–44)
AST: 17 U/L (ref 15–41)
Albumin: 3.8 g/dL (ref 3.5–5.0)
Alkaline Phosphatase: 61 U/L (ref 38–126)
Anion gap: 10 (ref 5–15)
BUN: 13 mg/dL (ref 8–23)
CO2: 26 mmol/L (ref 22–32)
Calcium: 9.2 mg/dL (ref 8.9–10.3)
Chloride: 105 mmol/L (ref 98–111)
Creatinine: 1.01 mg/dL — ABNORMAL HIGH (ref 0.44–1.00)
GFR, Est AFR Am: >60 mL/min (ref >60)
GFR, Estimated: 57 mL/min — ABNORMAL LOW (ref >60)
Glucose, Bld: 190 mg/dL — ABNORMAL HIGH (ref 70–99)
Potassium: 3.9 mmol/L (ref 3.5–5.1)
Sodium: 141 mmol/L (ref 135–145)
Total Bilirubin: 0.4 mg/dL (ref 0.3–1.2)
Total Protein: 6.9 g/dL (ref 6.5–8.1)

## 2019-03-03 MED ORDER — SODIUM CHLORIDE 0.9% FLUSH
10.0000 mL | Freq: Once | INTRAVENOUS | Status: AC
  Administered 2019-03-03: 09:00:00 10 mL
  Filled 2019-03-03: qty 10

## 2019-03-03 MED ORDER — HEPARIN SOD (PORK) LOCK FLUSH 100 UNIT/ML IV SOLN
250.0000 [IU] | Freq: Once | INTRAVENOUS | Status: AC
  Administered 2019-03-03: 250 [IU]
  Filled 2019-03-03: qty 5

## 2019-03-03 NOTE — Assessment & Plan Note (Signed)
She is up-to-date with mammogram screening and the result is negative

## 2019-03-03 NOTE — Assessment & Plan Note (Signed)
She has mild intermittent pancytopenia due to [place] but not symptomatic Observe only

## 2019-03-03 NOTE — Telephone Encounter (Signed)
Scheduled appt per 1/19 sch message pt aware of appt date and time   

## 2019-03-03 NOTE — Assessment & Plan Note (Signed)
She tolerated olaparib well without side effects Her blood counts are within normal limits Her tumor markers are stable/within normal range She will continue follow-up every 8 weeks with port flushes and lab monitoring and I will see her every other time We discussed the signs and symptoms to watch out for.   Her recent MRI showed no evidence of disease I do not plan to repeat imaging study unless she have symptoms or elevated tumor marker

## 2019-03-03 NOTE — Assessment & Plan Note (Signed)
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

## 2019-03-03 NOTE — Assessment & Plan Note (Signed)
She have signs of hepatic steatosis on MRI We discussed the importance of lifestyle modification and weight loss The patient is noted to have gained some weight since last time I saw her

## 2019-03-03 NOTE — Progress Notes (Signed)
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   Her recent MRI showed no evidence of disease I do not plan to repeat imaging study unless she have symptoms or elevated tumor marker  Pancytopenia, acquired ([place]) She has mild intermittent pancytopenia due to [place] but not symptomatic Observe only  Hepatic steatosis She have signs of hepatic steatosis on MRI We discussed the importance of lifestyle modification and weight loss The patient is noted to have gained some weight since last time I saw her  Pancreatic cyst She is noted to have pancreatic cyst on recent MRI Per radiology guidelines, I plan to repeat another MRI in 2 years, due in July 2022  BRCA 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 Comprehensive metabolic panel    Standing Status:   Standing    Number of Occurrences:   22    Standing Expiration Date:   03/02/2020  . CBC with Differential    Standing Status:   Standing    Number of Occurrences:   22    Standing Expiration Date:   03/02/2020    INTERVAL HISTORY: Please see below for problem oriented charting. She returns for further follow-up She tolerated [name] well She denies recent abdominal bloating, pain or changes in bowel habits She is up-to-date with her screening program including mammogram and recent influenza vaccination  SUMMARY OF ONCOLOGIC HISTORY: Oncology History Overview Note  ER 15-20%, PR 5-10%,  Her2/neu negative MMR normal Negative genetic testing HRD pos BRCA1 positive MSI Stable   Right ovarian epithelial cancer ([place])  07/30/2017 Imaging   US pelvis Ultrasound revealed a complex cystic mass in the right adnexa measuring 20 x 11 x 12 cm with multiple internal septations some of which are thick. The left adnexa measured 12.7 x 11.6 x 8.1 with low level echoes and soft tissue nodules    07/30/2017 Tumor Marker   Patient's tumor was tested for the following markers: CA-125 Results of the tumor marker test revealed 521.3   08/17/2017 Pathology Results   The malignant cells are positive for PAX-8, cytokeratin 7, estrogen receptor, and faintly positive for GATA-3. They are negative for p53, GCDFP, and cytokeratin 20. The finding are consistent with a gynecologic primary carcinoma. Additional studies can be performed upon clinician request.   08/17/2017 Procedure   Technically successful CT-guided left lower quadrant omental mass core biopsy.   08/20/2017 PET scan   1. Cystic masses arising from the pelvis. The nodular component of the RIGHT cystic mass is intensely hypermetabolic consistent with malignant ovarian neoplasm. 2. Extensive hypermetabolic peritoneal thickening in the lower abdomen and upper pelvis, upper abdomen, and upper abdominal precordial fat and paradiaphragmatic fat. 3. Retroperitoneal nodal metastasis adjacent to the IVC at the level the kidneys. 4. No evidence of metastatic disease in the thorax other small effusion on the LEFT and nodal metastasis in the fat superior to the diaphragm. 5. Mild metabolic activity associated the distal esophagus is favored benign esophagitis.  08/20/2017 Imaging   CT chest 1. Bilateral cardiophrenic angle nodal metastasis. No additional findings to suggest metastatic disease to the chest. 2. Small left pleural effusion. 3. Peritoneal carcinomatosis noted within the abdomen. 4. Hepatic steatosis.    08/24/2017 Cancer Staging    Staging form: Ovary, Fallopian Tube, and Primary Peritoneal Carcinoma, AJCC 8th Edition - Clinical: Stage IV (cT3, cN1, cM1) - Signed by [name], MD on 08/24/2017   08/31/2017 Tumor Marker   Patient's tumor was tested for the following markers: CA-125 Results of the tumor marker test revealed 819.9   09/26/2017 Adverse Reaction   She developed reaction to Taxol, managed successfully with additional premedications   10/17/2017 Tumor Marker   Patient's tumor was tested for the following markers: CA-125 Results of the tumor marker test revealed 374.4   10/31/2017 Imaging   1. No significant change in size of the large complex bilateral adnexal masses consistent with the known history of ovarian cancer. There is increased dependent density within the left-sided lesion. 2. Stable peritoneal carcinomatosis.  No significant ascites.  3. The bilateral pericardiac adenopathy has improved. No progressive thoracic metastatic disease. No pulmonary or osseous metastatic disease. 4. Suspicion of nonocclusive thrombus in the right internal jugular vein related to the right IJ port. Recommend further evaluation with Doppler ultrasound.   11/13/2017 Surgery   Preoperative Diagnosis: Ovarian cancer s/p neoadjuvant chemotherapy   Procedure(s) Performed:   1. Exploratory laparotomy  2. Bilateral salpingo-oophorectomy with radical tumor debulking for ovarian cancer   including retroperitoneal dissection, lysis of adhesions (enterolysis of small bowel in deep pelvis, left adnexal adhesions to sigmoid and omentum to LLQ) ~1 hour, ureterolysis. 3. Omentectomy  4. Takedown of splenic flexure with removal of omental tumor in left upper quadrant  Specimens: Bilateral tubes and ovaries, omentum, splenic flexure nodule, right paracolic gutter nodule, cecal nodule, right ureteral peritoneal nodule, small bowel mesentary.  Indication for Procedure:  Patient is s/p 3 cycles of chemotherapy with response based on  CA125 and decreased mediastinal disease (to <1cm).  Operative Findings:  This represented an optimal cytoreduction with gross residual disease remaining in the deep pelvis near the levator floor and possibly in the region of the right IP ligament/periappendicial region.  Upon entry a large ~20cm right tube/ovary, mostly cystic. Adherent rind along ileocecal region and appendix. Large cystic left tube/ovary ~10cm with sigmoid colon stretched across the mass and extension of the cystic lesion into the deep pelvis with residual palpable disease deep pelvis near levators. Estimate ~1cm or less on palpation, not visible disease. Omental caking in the LLQ adherent to pelvic sidewall. Omental disease noted RUQ separate. In addition ~1.5cm lesion in splenic flexure omentum. Evidence of prior diaphragmatic disease, no visible disease.      11/13/2017 Pathology Results   1. 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 - TUMOR INVOLVES THE OVARIAN SURFACE. - RIGHT FALLOPIAN TUBE IS INVOLVED. - SEE ONCOLOGY TABLE. - SEE NOTE. 2. Soft tissue mass, biopsy, cecal gutter nodule - METASTATIC MUCINOUS ADENOCARCINOMA. 3. Ovary and fallopian tube, left, left ovary and fallopian tube - METASTATIC MUCINOUS ADENOCARCINOMA TO LEFT OVARY AND FALLOPIAN TUBE. 4. Omentum, resection for tumor - METASTATIC MUCINOUS ADENOCARCINOMA TO OMENTUM. 5. Soft tissue mass, biopsy, splenic flexure nodule - METASTATIC MUCINOUS ADENOCARCINOMA. 6. Soft tissue mass, biopsy, cecal implant - METASTATIC MUCINOUS ADENOCARCINOMA. 7. Soft tissue mass, biopsy, right ureteral peritoneal implant - METASTATIC MUCINOUS ADENOCARCINOMA. 8. Mesentery, small bowel nodule - METASTATIC MUCINOUS  ADENOCARCINOMA. Microscopic Comment 1. OVARY or FALLOPIAN TUBE or PRIMARY PERITONEUM: Procedure: Bilateral salpingo-oophorectomy. Specimen Integrity: Intact. Tumor Site: Right ovary. Ovarian Surface  Involvement: Present. Fallopian Tube Surface Involvement: Present. Tumor Size: 20 cm. Histologic Type: Mucinous adenocarcinoma. Histologic Grade: Overall G2 (moderately differentiated) (focal areas of poor differentiation are present). Implants: Present. Other Tissue/ Organ Involvement: Left ovary, left fallopian tube and omentum. Largest Extrapelvic Peritoneal Focus: less than 2 cm. See note Peritoneal/Ascitic Fluid: N/A Treatment Effect: No definite or minimal response identified (chemotherapy response score 1 [CRS 1]) Regional Lymph Nodes No lymph nodes submitted or found Number of Lymph Nodes Examined: 0 Pathologic Stage Classification (pTNM, AJCC 8th Edition): ypT3b, ypNX. Representative Tumor Block: 1D and 1E. (NDK:gt, 11/15/17)   11/19/2017 Genetic Testing   Negative genetic testing on the [facility] Myrisk panel.  The [facility] gene panel offered by [employer] includes sequencing and deletion/duplication testing of the following 35 genes: APC, ATM, AXIN2, BARD1, BMPR1A, BRCA1, BRCA2, BRIP1, CHD1, CDK4, CDKN2A, CHEK2, EPCAM (large rearrangement only), HOXB13, (sequencing only), GALNT12, MLH1, MSH2, MSH3 (excluding repetitive portions of exon 1), MSH6, MUTYH, NBN, NTHL1, PALB2, PMS2, PTEN, RAD51C, RAD51D, RNF43, RPS20, SMAD4, STK11, and TP53. Sequencing was performed for select regions of POLE and POLD1, and large rearrangement analysis was performed for select regions of GREM1. The report date is November 19, 2017.  HRD tumor results indicate a BRCA1 mutation identified in the ovarian tumor causing genomic instability. The report date is 12/04/2017     Genetic Testing   Patient has genetic testing done for ER/PR and Her2/neu. Results revealed patient has the following: ER 15-20% PR 5-10% Her2/neu - negative    Genetic Testing   Patient has genetic testing done for MSI/MMR. Results revealed patient has the following mutation(s): MMR normal, MSI stable   12/10/2017 Imaging    1. Interval resection of large bilateral adnexal cystic masses. Residual soft tissue/tumor within bilateral adnexal regions as above. 2. Interval resolution of previously noted omental cake which may reflect interval omentectomy. There is a new peritoneal deposit identified within the left upper quadrant of the abdomen involving the anterior surface of the spleen.   12/10/2017 Tumor Marker   Patient's tumor was tested for the following markers: CA-125 Results of the tumor marker test revealed 94.9   12/11/2017 - 02/20/2018 Chemotherapy   The patient had FOLFOX regimen for mucinous   01/09/2018 Tumor Marker   Patient's tumor was tested for the following markers: CA-125 Results of the tumor marker test revealed 37   01/31/2018 Imaging   Resolution of peritoneal soft tissue density along the anterior margin of the spleen since prior study. No evidence of residual or progressive metastatic disease.  Mild sigmoid diverticulosis, without radiographic evidence of diverticulitis. Resolution of small pericolonic abscess along the left lateral pelvic sidewall.  Mild hepatic steatosis.   02/06/2018 Tumor Marker   Patient's tumor was tested for the following markers: CA-125 Results of the tumor marker test revealed 36.7   03/06/2018 -  Chemotherapy   The patient is started on [name] as PARP maintenance   03/13/2018 Tumor Marker   Patient's tumor was tested for the following markers: CA-125 Results of the tumor marker test revealed 33.8   04/05/2018 Tumor Marker   Patient's tumor was tested for the following markers: CA-125 Results of the tumor marker test revealed 27.3   05/03/2018 Tumor Marker   Patient's tumor was tested for the following markers: CA-125 Results of the tumor marker test revealed 23.5   07/08/2018  Tumor Marker   Patient's tumor was tested for the following markers: CA-125 Results of the tumor marker test revealed 19.9   09/02/2018 Tumor Marker   Patient's tumor was  tested for the following markers: CA-125 Results of the tumor marker test revealed 17   10/28/2018 Imaging   1. 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 Repeat abdominal MRI with and without IV gadolinium with MRCP is recommended in 2 years to ensure continued stability. This recommendation follows ACR consensus guidelines: Management of Incidental Pancreatic Cysts: A White Paper of the ACR Incidental Findings Committee. [place] [id number]. 2. Hepatic steatosis. 3. Additional incidental findings, as above.     10/28/2018 Tumor Marker   Patient's tumor was tested for the following markers: CA-125 Results of the tumor marker test revealed 16.9     REVIEW OF SYSTEMS:   Constitutional: Denies fevers, chills or abnormal weight loss Eyes: Denies blurriness of vision Ears, nose, mouth, throat, and face: Denies mucositis or sore throat Respiratory: Denies cough, dyspnea or wheezes Cardiovascular: Denies palpitation, chest discomfort or lower extremity swelling Gastrointestinal:  Denies nausea, heartburn or change in bowel habits Skin: Denies abnormal skin rashes Lymphatics: Denies new lymphadenopathy or easy bruising Neurological:Denies numbness, tingling or new weaknesses Behavioral/Psych: Mood is stable, no new changes  All other systems were reviewed with the patient and are negative.  I have reviewed the past medical history, past surgical history, social history and family history with the patient and they are unchanged from previous note.  ALLERGIES:  has No Known Allergies.  MEDICATIONS:  Current Outpatient Medications  Medication Sig Dispense Refill  . acetaminophen (TYLENOL) 500 MG tablet Take 500 mg by mouth every 8 (eight) hours as needed for mild pain or moderate pain.    Marland Kitchen atenolol (TENORMIN) 25 MG tablet Take 25 mg by mouth at  bedtime.     Marland Kitchen atorvastatin (LIPITOR) 20 MG tablet Take 20 mg by mouth daily.    Marland Kitchen CALCIUM-MAGNESIUM-VITAMIN D PO Take 1 tablet by mouth daily.    . famotidine (PEPCID) 20 MG tablet Take 20 mg by mouth as needed for heartburn or indigestion.    . lidocaine-prilocaine (EMLA) cream Apply 1 application topically as needed. 30 g 6  . loratadine (CLARITIN) 10 MG tablet Take 10 mg by mouth daily as needed for allergies.    Marland Kitchen [name] 100 MG tablet TAKE 2 TABLETS (200 MG TOTAL) BY MOUTH 2 (TWO) TIMES DAILY. SWALLOW WHOLE. 120 tablet 11  . metFORMIN (GLUCOPHAGE) 1000 MG tablet Take 500 mg by mouth 2 (two) times daily.   0  . Multiple Vitamin (MULTIVITAMIN WITH MINERALS) TABS tablet Take 1 tablet by mouth daily.    . ondansetron (ZOFRAN) 8 MG tablet Take 1 tablet (8 mg total) by mouth every 8 (eight) hours as needed for nausea. (Patient not taking: Reported on 04/03/2018) 30 tablet 3  . prochlorperazine (COMPAZINE) 10 MG tablet Take 1 tablet (10 mg total) by mouth every 6 (six) hours as needed for nausea or vomiting. (Patient not taking: Reported on 04/03/2018) 30 tablet 0   No current facility-administered medications for this visit.     PHYSICAL EXAMINATION: ECOG PERFORMANCE STATUS: 0 - Asymptomatic  Vitals:   03/03/19 0925  BP: 134/80  Pulse: 70  Resp: 17  Temp: 97.9 F (36.6 C)  SpO2: 99%   Filed Weights   03/03/19 0925  Weight: 157 lb 8 oz (71.4 kg)    GENERAL:alert, no distress and comfortable SKIN: skin color, texture, turgor are normal, no rashes or significant lesions EYES: normal, Conjunctiva are pink and non-injected, sclera clear OROPHARYNX:no exudate, no erythema and lips, buccal mucosa, and tongue normal  NECK: supple, thyroid normal size, non-tender, without nodularity LYMPH:  no palpable lymphadenopathy in the cervical, axillary or inguinal LUNGS: clear to auscultation and percussion with normal breathing effort HEART: regular rate & rhythm and no murmurs and no lower  extremity edema ABDOMEN:abdomen soft, non-tender and normal bowel sounds Musculoskeletal:no cyanosis of digits and no clubbing  NEURO: alert & oriented x 3 with fluent speech, no focal motor/sensory deficits  LABORATORY DATA:  I have reviewed the data as listed    Component Value Date/Time   NA 141 03/03/2019 0905   K 3.9 03/03/2019 0905   CL 105 03/03/2019 0905   CO2 26 03/03/2019 0905   GLUCOSE 190 (H) 03/03/2019 0905   BUN 13 03/03/2019 0905   CREATININE 1.01 (H) 03/03/2019 0905   CREATININE 0.86 06/05/2014 0957   CALCIUM 9.2 03/03/2019 0905   PROT 6.9 03/03/2019 0905   ALBUMIN 3.8 03/03/2019 0905   AST 17 03/03/2019 0905   ALT 15 03/03/2019 0905   ALKPHOS 61 03/03/2019 0905   BILITOT 0.4 03/03/2019 0905   GFRNONAA 57 (L) 03/03/2019 0905   GFRAA >60 03/03/2019 0905    No results found for: SPEP, UPEP  Lab Results  Component Value Date   WBC 7.0 03/03/2019   NEUTROABS 4.6 03/03/2019   HGB 11.8 (L) 03/03/2019   HCT 35.6 (L) 03/03/2019   MCV 108.5 (H) 03/03/2019   PLT 157 03/03/2019      Chemistry      Component Value Date/Time   NA 141 03/03/2019 0905   K 3.9 03/03/2019 0905   CL 105 03/03/2019 0905   CO2 26 03/03/2019 0905   BUN 13 03/03/2019 0905   CREATININE 1.01 (H) 03/03/2019 0905   CREATININE 0.86 06/05/2014 0957      Component Value Date/Time   CALCIUM 9.2 03/03/2019 0905   ALKPHOS 61 03/03/2019 0905   AST 17 03/03/2019 0905   ALT 15 03/03/2019 0905   BILITOT 0.4 03/03/2019 0905       RADIOGRAPHIC STUDIES: I have personally reviewed the radiological images as listed and agreed with the findings in the report. Mm 3d Screen Breast Bilateral  Result Date: 02/12/2019 CLINICAL DATA:  Screening. EXAM: DIGITAL SCREENING BILATERAL MAMMOGRAM WITH TOMO AND CAD COMPARISON:  Previous exam(s). ACR Breast Density Category b: There are scattered areas of fibroglandular density. FINDINGS: There are no findings suspicious for malignancy. Images were processed  with CAD. IMPRESSION: No mammographic evidence of malignancy. A result letter of this screening mammogram will be mailed directly to the patient. RECOMMENDATION: Screening mammogram in one year. (Code:[id number]-B-01Y) BI-RADS CATEGORY  1: Negative. Electronically Signed   By: Kristopher Oppenheim M.D.   On: 02/12/2019 13:23    All questions were answered. The patient knows to call the clinic with any problems, questions or concerns. No barriers to learning was detected.  I spent 15 minutes counseling the patient face to face. The total time spent in the appointment was 20 minutes and more than 50% was on counseling and review of test results  [name], MD 03/03/2019 10:42 AM

## 2019-03-04 ENCOUNTER — Telehealth: Payer: Self-pay | Admitting: *Deleted

## 2019-03-04 LAB — CA 125: Cancer Antigen (CA) 125: 12.7 U/mL (ref 0.0–38.1)

## 2019-03-04 NOTE — Telephone Encounter (Signed)
-----   Message from [name], MD sent at 03/04/2019  8:00 AM EST ----- Regarding: pls call and let her know CA-125 is better

## 2019-03-04 NOTE — Telephone Encounter (Signed)
Telephone call to patient to advise lab results as directed below. Patient very appreciative of the call.

## 2019-04-07 DIAGNOSIS — H5210 Myopia, unspecified eye: Secondary | ICD-10-CM | POA: Diagnosis not present

## 2019-04-21 MED FILL — LYNPARZA 100 MG TAB: 100 | 30 days supply | Qty: 120 | Fill #2

## 2019-04-29 ENCOUNTER — Inpatient Hospital Stay: Payer: Medicare HMO

## 2019-04-29 ENCOUNTER — Inpatient Hospital Stay: Payer: Medicare HMO | Attending: Obstetrics

## 2019-04-29 ENCOUNTER — Other Ambulatory Visit: Payer: Self-pay

## 2019-04-29 ENCOUNTER — Telehealth: Payer: Self-pay | Admitting: Oncology

## 2019-04-29 DIAGNOSIS — Z8543 Personal history of malignant neoplasm of ovary: Secondary | ICD-10-CM | POA: Diagnosis present

## 2019-04-29 DIAGNOSIS — C561 Malignant neoplasm of right ovary: Secondary | ICD-10-CM

## 2019-04-29 DIAGNOSIS — Z452 Encounter for adjustment and management of vascular access device: Secondary | ICD-10-CM | POA: Diagnosis not present

## 2019-04-29 LAB — CBC WITH DIFFERENTIAL/PLATELET
Abs Immature Granulocytes: 0.02 10*3/uL (ref 0.00–0.07)
Basophils Absolute: 0 10*3/uL (ref 0.0–0.1)
Basophils Relative: 1 %
Eosinophils Absolute: 0.1 10*3/uL (ref 0.0–0.5)
Eosinophils Relative: 2 %
HCT: 36.3 % (ref 36.0–46.0)
Hemoglobin: 12.4 g/dL (ref 12.0–15.0)
Immature Granulocytes: 0 %
Lymphocytes Relative: 28 %
Lymphs Abs: 2 10*3/uL (ref 0.7–4.0)
MCH: 37.2 pg — ABNORMAL HIGH (ref 26.0–34.0)
MCHC: 34.2 g/dL (ref 30.0–36.0)
MCV: 109 fL — ABNORMAL HIGH (ref 80.0–100.0)
Monocytes Absolute: 0.5 10*3/uL (ref 0.1–1.0)
Monocytes Relative: 7 %
Neutro Abs: 4.6 10*3/uL (ref 1.7–7.7)
Neutrophils Relative %: 62 %
Platelets: 164 10*3/uL (ref 150–400)
RBC: 3.33 MIL/uL — ABNORMAL LOW (ref 3.87–5.11)
RDW: 14.6 % (ref 11.5–15.5)
WBC: 7.2 10*3/uL (ref 4.0–10.5)
nRBC: 0 % (ref 0.0–0.2)

## 2019-04-29 LAB — CMP (CANCER CENTER ONLY)
ALT: 18 U/L (ref 0–44)
AST: 22 U/L (ref 15–41)
Albumin: 4.3 g/dL (ref 3.5–5.0)
Alkaline Phosphatase: 57 U/L (ref 38–126)
Anion gap: 9 (ref 5–15)
BUN: 18 mg/dL (ref 8–23)
CO2: 26 mmol/L (ref 22–32)
Calcium: 9.6 mg/dL (ref 8.9–10.3)
Chloride: 105 mmol/L (ref 98–111)
Creatinine: 0.84 mg/dL (ref 0.44–1.00)
GFR, Est AFR Am: >60 mL/min (ref >60)
GFR, Estimated: >60 mL/min (ref >60)
Glucose, Bld: 172 mg/dL — ABNORMAL HIGH (ref 70–99)
Potassium: 4.1 mmol/L (ref 3.5–5.1)
Sodium: 140 mmol/L (ref 135–145)
Total Bilirubin: 0.6 mg/dL (ref 0.3–1.2)
Total Protein: 7.2 g/dL (ref 6.5–8.1)

## 2019-04-29 MED ORDER — HEPARIN SOD (PORK) LOCK FLUSH 100 UNIT/ML IV SOLN
500.0000 [IU] | Freq: Once | INTRAVENOUS | Status: AC
  Administered 2019-04-29: 500 [IU]
  Filled 2019-04-29: qty 5

## 2019-04-29 MED ORDER — SODIUM CHLORIDE 0.9% FLUSH
10.0000 mL | Freq: Once | INTRAVENOUS | Status: AC
  Administered 2019-04-29: 09:00:00 10 mL
  Filled 2019-04-29: qty 10

## 2019-04-29 NOTE — Addendum Note (Signed)
Addended by: Priscille Loveless on: 04/29/2019 02:54 PM   Modules accepted: Orders

## 2019-04-29 NOTE — Patient Instructions (Signed)
Implanted Port Home Guide An implanted port is a type of central line that is placed under the skin. Central lines are used to provide IV access when treatment or nutrition needs to be given through a person's veins. Implanted ports are used for long-term IV access. An implanted port may be placed because:  You need IV medicine that would be irritating to the small veins in your hands or arms.  You need long-term IV medicines, such as antibiotics.  You need IV nutrition for a long period.  You need frequent blood draws for lab tests.  You need dialysis.  Implanted ports are usually placed in the chest area, but they can also be placed in the upper arm, the abdomen, or the leg. An implanted port has two main parts:  Reservoir. The reservoir is round and will appear as a small, raised area under your skin. The reservoir is the part where a needle is inserted to give medicines or draw blood.  Catheter. The catheter is a thin, flexible tube that extends from the reservoir. The catheter is placed into a large vein. Medicine that is inserted into the reservoir goes into the catheter and then into the vein.  How will I care for my incision site? Do not get the incision site wet. Bathe or shower as directed by your health care provider. How is my port accessed? Special steps must be taken to access the port:  Before the port is accessed, a numbing cream can be placed on the skin. This helps numb the skin over the port site.  Your health care provider uses a sterile technique to access the port. ? Your health care provider must put on a mask and sterile gloves. ? The skin over your port is cleaned carefully with an antiseptic and allowed to dry. ? The port is gently pinched between sterile gloves, and a needle is inserted into the port.  Only "non-coring" port needles should be used to access the port. Once the port is accessed, a blood return should be checked. This helps ensure that the port  is in the vein and is not clogged.  If your port needs to remain accessed for a constant infusion, a clear (transparent) bandage will be placed over the needle site. The bandage and needle will need to be changed every week, or as directed by your health care provider.  Keep the bandage covering the needle clean and dry. Do not get it wet. Follow your health care provider's instructions on how to take a shower or bath while the port is accessed.  If your port does not need to stay accessed, no bandage is needed over the port.  What is flushing? Flushing helps keep the port from getting clogged. Follow your health care provider's instructions on how and when to flush the port. Ports are usually flushed with saline solution or a medicine called heparin. The need for flushing will depend on how the port is used.  If the port is used for intermittent medicines or blood draws, the port will need to be flushed: ? After medicines have been given. ? After blood has been drawn. ? As part of routine maintenance.  If a constant infusion is running, the port may not need to be flushed.  How long will my port stay implanted? The port can stay in for as long as your health care provider thinks it is needed. When it is time for the port to come out, surgery will be   done to remove it. The procedure is similar to the one performed when the port was put in. When should I seek immediate medical care? When you have an implanted port, you should seek immediate medical care if:  You notice a bad smell coming from the incision site.  You have swelling, redness, or drainage at the incision site.  You have more swelling or pain at the port site or the surrounding area.  You have a fever that is not controlled with medicine.  This information is not intended to replace advice given to you by your health care provider. Make sure you discuss any questions you have with your health care provider. Document  Released: 04/10/2005 Document Revised: 09/16/2015 Document Reviewed: 12/16/2012 Elsevier Interactive Patient Education  2017 [employer]  

## 2019-04-29 NOTE — Telephone Encounter (Signed)
Labs from today is ok. CA-125 is pending; assuming the result is normal, I suggest she stop [name] for 5 days before her eye surgery and resume taking the day after surgery

## 2019-04-29 NOTE — Telephone Encounter (Signed)
Called Sabrina Vang and advised her of message from Dr. Alvy Bimler and that we will let her know the results of her CA 125 when it is back.  She verbalized understanding and agreement.

## 2019-04-29 NOTE — Telephone Encounter (Signed)
Sabrina Vang called and said she has been told that she needs cataract surgery.  She has a consult with the surgeon on 05/06/19.  She wants to make sure it is ok to have the surgery because of her cancer and while taking lynparza.

## 2019-04-30 LAB — CA 125: Cancer Antigen (CA) 125: 11.8 U/mL (ref 0.0–38.1)

## 2019-05-02 ENCOUNTER — Telehealth: Payer: Self-pay | Admitting: Oncology

## 2019-05-02 NOTE — Telephone Encounter (Signed)
Sabrina Vang called and was advised of her CA 125 results.  She verbalized understanding and agreement.

## 2019-05-06 DIAGNOSIS — H18413 Arcus senilis, bilateral: Secondary | ICD-10-CM | POA: Diagnosis not present

## 2019-05-06 DIAGNOSIS — H2511 Age-related nuclear cataract, right eye: Secondary | ICD-10-CM | POA: Diagnosis not present

## 2019-05-06 DIAGNOSIS — H2513 Age-related nuclear cataract, bilateral: Secondary | ICD-10-CM | POA: Diagnosis not present

## 2019-05-06 DIAGNOSIS — H25013 Cortical age-related cataract, bilateral: Secondary | ICD-10-CM | POA: Diagnosis not present

## 2019-05-06 DIAGNOSIS — H25043 Posterior subcapsular polar age-related cataract, bilateral: Secondary | ICD-10-CM | POA: Diagnosis not present

## 2019-05-21 MED FILL — LYNPARZA 100 MG TAB: 100 | 30 days supply | Qty: 120 | Fill #3

## 2019-06-02 ENCOUNTER — Telehealth: Payer: Self-pay | Admitting: Hematology and Oncology

## 2019-06-02 NOTE — Telephone Encounter (Signed)
Rescheduled per 2/8 sch msg, pt req. Called and spoke with pt, confirmed 3/16 appt

## 2019-06-05 ENCOUNTER — Telehealth: Payer: Self-pay | Admitting: Oncology

## 2019-06-05 NOTE — Telephone Encounter (Signed)
Sabrina Vang called and said Dr. Willey Blade has started her on losartan 50 mg once daily at bedtime (added to med list in Epic).    She also said her cataract surgery dates have been changed because she has been referred to a different surgeon (Dr. Lucita Ferrara) because of her astigmatism.  She will now have her right eye done on 06/17/19 and her left eye on 06/24/19.    She wants to make sure since the two surgeries are close together that she is to stop taking [name] 5 days before her surgery for her right eye, resume taking 06/18/19 and then stop taking again on 06/19/19 before her left eye is done and resume the day after.

## 2019-06-05 NOTE — Telephone Encounter (Signed)
Called Liley and notified her of message below from Dr. Alvy Bimler.  She verbalized understanding and agreement.

## 2019-06-05 NOTE — Telephone Encounter (Signed)
I suggest not restart Sabrina Vang since the surgeries are so close to each other until the day after her second surgery

## 2019-06-24 ENCOUNTER — Other Ambulatory Visit: Payer: Medicare HMO

## 2019-06-24 ENCOUNTER — Ambulatory Visit: Payer: Medicare HMO | Admitting: Hematology and Oncology

## 2019-06-26 MED FILL — LYNPARZA 100 MG TAB: 100 | 30 days supply | Qty: 120 | Fill #4

## 2019-06-30 ENCOUNTER — Encounter: Payer: Self-pay | Admitting: *Deleted

## 2019-07-08 ENCOUNTER — Inpatient Hospital Stay: Payer: Medicare HMO

## 2019-07-08 ENCOUNTER — Inpatient Hospital Stay: Payer: Medicare HMO | Attending: Obstetrics

## 2019-07-08 ENCOUNTER — Telehealth: Payer: Self-pay | Admitting: Hematology and Oncology

## 2019-07-08 ENCOUNTER — Inpatient Hospital Stay: Payer: Medicare HMO | Admitting: Hematology and Oncology

## 2019-07-08 ENCOUNTER — Encounter: Payer: Self-pay | Admitting: Hematology and Oncology

## 2019-07-08 ENCOUNTER — Other Ambulatory Visit: Payer: Self-pay

## 2019-07-08 DIAGNOSIS — E119 Type 2 diabetes mellitus without complications: Secondary | ICD-10-CM

## 2019-07-08 DIAGNOSIS — T451X5A Adverse effect of antineoplastic and immunosuppressive drugs, initial encounter: Secondary | ICD-10-CM | POA: Diagnosis not present

## 2019-07-08 DIAGNOSIS — C561 Malignant neoplasm of right ovary: Secondary | ICD-10-CM

## 2019-07-08 DIAGNOSIS — D6481 Anemia due to antineoplastic chemotherapy: Secondary | ICD-10-CM

## 2019-07-08 DIAGNOSIS — Z8543 Personal history of malignant neoplasm of ovary: Secondary | ICD-10-CM | POA: Diagnosis present

## 2019-07-08 LAB — CBC WITH DIFFERENTIAL/PLATELET
Abs Immature Granulocytes: 0.02 10*3/uL (ref 0.00–0.07)
Basophils Absolute: 0 10*3/uL (ref 0.0–0.1)
Basophils Relative: 1 %
Eosinophils Absolute: 0.1 10*3/uL (ref 0.0–0.5)
Eosinophils Relative: 2 %
HCT: 35.7 % — ABNORMAL LOW (ref 36.0–46.0)
Hemoglobin: 11.7 g/dL — ABNORMAL LOW (ref 12.0–15.0)
Immature Granulocytes: 0 %
Lymphocytes Relative: 29 %
Lymphs Abs: 1.8 10*3/uL (ref 0.7–4.0)
MCH: 35.8 pg — ABNORMAL HIGH (ref 26.0–34.0)
MCHC: 32.8 g/dL (ref 30.0–36.0)
MCV: 109.2 fL — ABNORMAL HIGH (ref 80.0–100.0)
Monocytes Absolute: 0.3 10*3/uL (ref 0.1–1.0)
Monocytes Relative: 5 %
Neutro Abs: 3.9 10*3/uL (ref 1.7–7.7)
Neutrophils Relative %: 63 %
Platelets: 158 10*3/uL (ref 150–400)
RBC: 3.27 MIL/uL — ABNORMAL LOW (ref 3.87–5.11)
RDW: 13.7 % (ref 11.5–15.5)
WBC: 6.2 10*3/uL (ref 4.0–10.5)
nRBC: 0 % (ref 0.0–0.2)

## 2019-07-08 LAB — COMPREHENSIVE METABOLIC PANEL
ALT: 15 U/L (ref 0–44)
AST: 23 U/L (ref 15–41)
Albumin: 3.7 g/dL (ref 3.5–5.0)
Alkaline Phosphatase: 64 U/L (ref 38–126)
Anion gap: 10 (ref 5–15)
BUN: 14 mg/dL (ref 8–23)
CO2: 25 mmol/L (ref 22–32)
Calcium: 9.2 mg/dL (ref 8.9–10.3)
Chloride: 105 mmol/L (ref 98–111)
Creatinine, Ser: 1.01 mg/dL — ABNORMAL HIGH (ref 0.44–1.00)
GFR calc Af Amer: >60 mL/min (ref >60)
GFR calc non Af Amer: 57 mL/min — ABNORMAL LOW (ref >60)
Glucose, Bld: 213 mg/dL — ABNORMAL HIGH (ref 70–99)
Potassium: 4 mmol/L (ref 3.5–5.1)
Sodium: 140 mmol/L (ref 135–145)
Total Bilirubin: 0.4 mg/dL (ref 0.3–1.2)
Total Protein: 6.8 g/dL (ref 6.5–8.1)

## 2019-07-08 NOTE — Assessment & Plan Note (Signed)
This is likely anemia of chronic disease. The patient denies recent history of bleeding such as epistaxis, hematuria or hematochezia. She is asymptomatic from the anemia. We will observe for now.  She does not require transfusion now. I do not recommend any further work-up at this time.   

## 2019-07-08 NOTE — Telephone Encounter (Signed)
Scheduled per 3/16 sch msg. Called and spoke with pt, confirmed 5/11, 7/6, and 8/31 appts

## 2019-07-08 NOTE — Progress Notes (Signed)
[facility] OFFICE PROGRESS NOTE  Patient Care Team: Sabrina Noble, MD as PCP - General (Internal Medicine)  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Sabrina Vang) Overall, she tolerated treatment very well without side effects Her tumor marker is pending We discussed the role of treatment will be for at least 2 years I will see her again in 6 months for further follow-up and reviewed the plan of care Once she has completed 2 years of treatment by October of this year, we can consider discontinuation of treatment and port removal  Type 2 diabetes mellitus treated without insulin (Sabrina Vang) She has significant hyperglycemia recently I suspect this is due to poor dietary choices We discussed the importance of lifestyle modification and dietary management to treat her diabetes  Anemia due to antineoplastic chemotherapy This is likely anemia of chronic disease. The patient denies recent history of bleeding such as epistaxis, hematuria or hematochezia. She is asymptomatic from the anemia. We will observe for now.  She does not require transfusion now. I do not recommend any further work-up at this time.     No orders of the defined types were placed in this encounter.   All questions were answered. The patient knows to call the clinic with any problems, questions or concerns. The total time spent in the appointment was 20 minutes encounter with patients including review of chart and various tests results, discussions about plan of care and coordination of care plan   Sabrina Lark, MD 07/08/2019 4:18 PM  INTERVAL HISTORY: Please see below for problem oriented charting. She returns for chemotherapy follow-up She tolerated [name] well No difficulties getting medications refill She is undergoing a lot of stress recently, taking care of her disabled son who have Huntington's disease No recent abdominal pain, changes in bowel habits or abdominal bloating  SUMMARY OF ONCOLOGIC  HISTORY: Oncology History Overview Note  ER 15-20%, PR 5-10%, Her2/neu negative MMR normal Negative genetic testing HRD pos BRCA1 positive MSI Stable   Right ovarian epithelial cancer ([place])  07/30/2017 Imaging   US pelvis Ultrasound revealed a complex cystic mass in the right adnexa measuring 20 x 11 x 12 cm with multiple internal septations some of which are thick. The left adnexa measured 12.7 x 11.6 x 8.1 with low level echoes and soft tissue nodules    07/30/2017 Tumor Marker   Patient's tumor was tested for the following markers: CA-125 Results of the tumor marker test revealed 521.3   08/17/2017 Pathology Results   The malignant cells are positive for PAX-8, cytokeratin 7, estrogen receptor, and faintly positive for GATA-3. They are negative for p53, GCDFP, and cytokeratin 20. The finding are consistent with a gynecologic primary carcinoma. Additional studies can be performed upon clinician request.   08/17/2017 Procedure   Technically successful CT-guided left lower quadrant omental mass core biopsy.   08/20/2017 PET scan   1. Cystic masses arising from the pelvis. The nodular component of the RIGHT cystic mass is intensely hypermetabolic consistent with malignant ovarian neoplasm. 2. Extensive hypermetabolic peritoneal thickening in the lower abdomen and upper pelvis, upper abdomen, and upper abdominal precordial fat and paradiaphragmatic fat. 3. Retroperitoneal nodal metastasis adjacent to the IVC at the level the kidneys. 4. No evidence of metastatic disease in the thorax other small effusion on the LEFT and nodal metastasis in the fat superior to the diaphragm. 5. Mild metabolic activity associated the distal esophagus is favored benign esophagitis.   08/20/2017 Imaging   CT chest 1. Bilateral  cardiophrenic angle nodal metastasis. No additional findings to suggest metastatic disease to the chest. 2. Small left pleural effusion. 3. Peritoneal carcinomatosis noted within the  abdomen. 4. Hepatic steatosis.    08/24/2017 Cancer Staging   Staging form: Ovary, Fallopian Tube, and Primary Peritoneal Carcinoma, AJCC 8th Edition - Clinical: Stage IV (cT3, cN1, cM1) - Signed by Sabrina Lark, MD on 08/24/2017   08/31/2017 Tumor Marker   Patient's tumor was tested for the following markers: CA-125 Results of the tumor marker test revealed 819.9   09/26/2017 Adverse Reaction   She developed reaction to Taxol, managed successfully with additional premedications   10/17/2017 Tumor Marker   Patient's tumor was tested for the following markers: CA-125 Results of the tumor marker test revealed 374.4   10/31/2017 Imaging   1. No significant change in size of the large complex bilateral adnexal masses consistent with the known history of ovarian cancer. There is increased dependent density within the left-sided lesion. 2. Stable peritoneal carcinomatosis.  No significant ascites.  3. The bilateral pericardiac adenopathy has improved. No progressive thoracic metastatic disease. No pulmonary or osseous metastatic disease. 4. Suspicion of nonocclusive thrombus in the right internal jugular vein related to the right IJ port. Recommend further evaluation with Doppler ultrasound.   11/13/2017 Surgery   Preoperative Diagnosis: Ovarian cancer s/p neoadjuvant chemotherapy   Procedure(s) Performed:   1. Exploratory laparotomy  2. Bilateral salpingo-oophorectomy with radical tumor debulking for ovarian cancer   including retroperitoneal dissection, lysis of adhesions (enterolysis of small bowel in deep pelvis, left adnexal adhesions to sigmoid and omentum to LLQ) ~1 hour, ureterolysis. 3. Omentectomy  4. Takedown of splenic flexure with removal of omental tumor in left upper quadrant  Specimens: Bilateral tubes and ovaries, omentum, splenic flexure nodule, right paracolic gutter nodule, cecal nodule, right ureteral peritoneal nodule, small bowel mesentary.  Indication for Procedure:   Patient is s/p 3 cycles of chemotherapy with response based on CA125 and decreased mediastinal disease (to <1cm).  Operative Findings:  This represented an optimal cytoreduction with gross residual disease remaining in the deep pelvis near the levator floor and possibly in the region of the right IP ligament/periappendicial region.  Upon entry a large ~20cm right tube/ovary, mostly cystic. Adherent rind along ileocecal region and appendix. Large cystic left tube/ovary ~10cm with sigmoid colon stretched across the mass and extension of the cystic lesion into the deep pelvis with residual palpable disease deep pelvis near levators. Estimate ~1cm or less on palpation, not visible disease. Omental caking in the LLQ adherent to pelvic sidewall. Omental disease noted RUQ separate. In addition ~1.5cm lesion in splenic flexure omentum. Evidence of prior diaphragmatic disease, no visible disease.      11/13/2017 Pathology Results   1. 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 - TUMOR INVOLVES THE OVARIAN SURFACE. - RIGHT FALLOPIAN TUBE IS INVOLVED. - SEE ONCOLOGY TABLE. - SEE NOTE. 2. Soft tissue mass, biopsy, cecal gutter nodule - METASTATIC MUCINOUS ADENOCARCINOMA. 3. Ovary and fallopian tube, left, left ovary and fallopian tube - METASTATIC MUCINOUS ADENOCARCINOMA TO LEFT OVARY AND FALLOPIAN TUBE. 4. Omentum, resection for tumor - METASTATIC MUCINOUS ADENOCARCINOMA TO OMENTUM. 5. Soft tissue mass, biopsy, splenic flexure nodule - METASTATIC MUCINOUS ADENOCARCINOMA. 6. Soft tissue mass, biopsy, cecal implant - METASTATIC MUCINOUS ADENOCARCINOMA. 7. Soft tissue mass, biopsy, right ureteral peritoneal implant - METASTATIC MUCINOUS ADENOCARCINOMA. 8. Mesentery, small bowel nodule - METASTATIC MUCINOUS ADENOCARCINOMA. Microscopic Comment 1. OVARY or FALLOPIAN TUBE  or PRIMARY PERITONEUM: Procedure: Bilateral salpingo-oophorectomy. Specimen  Integrity: Intact. Tumor Site: Right ovary. Ovarian Surface Involvement: Present. Fallopian Tube Surface Involvement: Present. Tumor Size: 20 cm. Histologic Type: Mucinous adenocarcinoma. Histologic Grade: Overall G2 (moderately differentiated) (focal areas of poor differentiation are present). Implants: Present. Other Tissue/ Organ Involvement: Left ovary, left fallopian tube and omentum. Largest Extrapelvic Peritoneal Focus: less than 2 cm. See note Peritoneal/Ascitic Fluid: N/A Treatment Effect: No definite or minimal response identified (chemotherapy response score 1 [CRS 1]) Regional Lymph Nodes No lymph nodes submitted or found Number of Lymph Nodes Examined: 0 Pathologic Stage Classification (pTNM, AJCC 8th Edition): ypT3b, ypNX. Representative Tumor Block: 1D and 1E. (NDK:gt, 11/15/17)   11/19/2017 Genetic Testing   Negative genetic testing on the [facility] Myrisk panel.  The [facility] gene panel offered by [employer] includes sequencing and deletion/duplication testing of the following 35 genes: APC, ATM, AXIN2, BARD1, BMPR1A, BRCA1, BRCA2, BRIP1, CHD1, CDK4, CDKN2A, CHEK2, EPCAM (large rearrangement only), HOXB13, (sequencing only), GALNT12, MLH1, MSH2, MSH3 (excluding repetitive portions of exon 1), MSH6, MUTYH, NBN, NTHL1, PALB2, PMS2, PTEN, RAD51C, RAD51D, RNF43, RPS20, SMAD4, STK11, and TP53. Sequencing was performed for select regions of POLE and POLD1, and large rearrangement analysis was performed for select regions of GREM1. The report date is November 19, 2017.  HRD tumor results indicate a BRCA1 mutation identified in the ovarian tumor causing genomic instability. The report date is 12/04/2017     Genetic Testing   Patient has genetic testing done for ER/PR and Her2/neu. Results revealed patient has the following: ER 15-20% PR 5-10% Her2/neu - negative    Genetic Testing   Patient has genetic testing done for MSI/MMR. Results revealed patient has the  following mutation(s): MMR normal, MSI stable   12/10/2017 Imaging   1. Interval resection of large bilateral adnexal cystic masses. Residual soft tissue/tumor within bilateral adnexal regions as above. 2. Interval resolution of previously noted omental cake which may reflect interval omentectomy. There is a new peritoneal deposit identified within the left upper quadrant of the abdomen involving the anterior surface of the spleen.   12/10/2017 Tumor Marker   Patient's tumor was tested for the following markers: CA-125 Results of the tumor marker test revealed 94.9   12/11/2017 - 02/20/2018 Chemotherapy   The patient had FOLFOX regimen for mucinous   01/09/2018 Tumor Marker   Patient's tumor was tested for the following markers: CA-125 Results of the tumor marker test revealed 37   01/31/2018 Imaging   Resolution of peritoneal soft tissue density along the anterior margin of the spleen since prior study. No evidence of residual or progressive metastatic disease.  Mild sigmoid diverticulosis, without radiographic evidence of diverticulitis. Resolution of small pericolonic abscess along the left lateral pelvic sidewall.  Mild hepatic steatosis.   02/06/2018 Tumor Marker   Patient's tumor was tested for the following markers: CA-125 Results of the tumor marker test revealed 36.7   03/06/2018 -  Chemotherapy   The patient is started on [name] as PARP maintenance   03/13/2018 Tumor Marker   Patient's tumor was tested for the following markers: CA-125 Results of the tumor marker test revealed 33.8   04/05/2018 Tumor Marker   Patient's tumor was tested for the following markers: CA-125 Results of the tumor marker test revealed 27.3   05/03/2018 Tumor Marker   Patient's tumor was tested for the following markers: CA-125 Results of the tumor marker test revealed 23.5   07/08/2018 Tumor Marker   Patient's tumor was tested  for the following markers: CA-125 Results of the tumor marker  test revealed 19.9   09/02/2018 Tumor Marker   Patient's tumor was tested for the following markers: CA-125 Results of the tumor marker test revealed 17   10/28/2018 Imaging   1. 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 Repeat abdominal MRI with and without IV gadolinium with MRCP is recommended in 2 years to ensure continued stability. This recommendation follows ACR consensus guidelines: Management of Incidental Pancreatic Cysts: A White Paper of the ACR Incidental Findings Committee. [place] 3419;[id number]. 2. Hepatic steatosis. 3. Additional incidental findings, as above.     10/28/2018 Tumor Marker   Patient's tumor was tested for the following markers: CA-125 Results of the tumor marker test revealed 16.9   03/03/2019 Tumor Marker   Patient's tumor was tested for the following markers: CA-125 Results of the tumor marker test revealed 12.7.     REVIEW OF SYSTEMS:   Constitutional: Denies fevers, chills or abnormal weight loss Eyes: Denies blurriness of vision Ears, nose, mouth, throat, and face: Denies mucositis or sore throat Respiratory: Denies cough, dyspnea or wheezes Cardiovascular: Denies palpitation, chest discomfort or lower extremity swelling Gastrointestinal:  Denies nausea, heartburn or change in bowel habits Skin: Denies abnormal skin rashes Lymphatics: Denies new lymphadenopathy or easy bruising Neurological:Denies numbness, tingling or new weaknesses Behavioral/Psych: Mood is stable, no new changes  All other systems were reviewed with the patient and are negative.  I have reviewed the past medical history, past surgical history, social history and family history with the patient and they are unchanged from previous note.  ALLERGIES:  has No Known Allergies.  MEDICATIONS:  Current Outpatient Medications  Medication  Sig Dispense Refill  . acetaminophen (TYLENOL) 500 MG tablet Take 500 mg by mouth every 8 (eight) hours as needed for mild pain or moderate pain.    Marland Kitchen atenolol (TENORMIN) 25 MG tablet Take 25 mg by mouth at bedtime.     Marland Kitchen atorvastatin (LIPITOR) 20 MG tablet Take 20 mg by mouth daily.    Marland Kitchen CALCIUM-MAGNESIUM-VITAMIN D PO Take 1 tablet by mouth daily.    . famotidine (PEPCID) 20 MG tablet Take 20 mg by mouth as needed for heartburn or indigestion.    . lidocaine-prilocaine (EMLA) cream Apply 1 application topically as needed. 30 g 6  . loratadine (CLARITIN) 10 MG tablet Take 10 mg by mouth daily as needed for allergies.    Marland Kitchen losartan (COZAAR) 50 MG tablet Take 50 mg by mouth at bedtime.    Marland Kitchen [name] 100 MG tablet TAKE 2 TABLETS (200 MG TOTAL) BY MOUTH 2 (TWO) TIMES DAILY. SWALLOW WHOLE. 120 tablet 11  . metFORMIN (GLUCOPHAGE) 1000 MG tablet Take 500 mg by mouth 2 (two) times daily.   0  . Multiple Vitamin (MULTIVITAMIN WITH MINERALS) TABS tablet Take 1 tablet by mouth daily.     No current facility-administered medications for this visit.    PHYSICAL EXAMINATION: ECOG PERFORMANCE STATUS: 0 - Asymptomatic  Vitals:   07/08/19 0937  BP: (!) 118/58  Pulse: 65  Resp: 18  Temp: 98.3 F (36.8 C)  SpO2: 97%   Filed Weights   07/08/19 0937  Weight: 156 lb 9.6 oz (71 kg)    GENERAL:alert, no distress and comfortable SKIN: skin color, texture, turgor are normal, no rashes or significant lesions EYES: normal, Conjunctiva are pink and  non-injected, sclera clear OROPHARYNX:no exudate, no erythema and lips, buccal mucosa, and tongue normal  NECK: supple, thyroid normal size, non-tender, without nodularity LYMPH:  no palpable lymphadenopathy in the cervical, axillary or inguinal LUNGS: clear to auscultation and percussion with normal breathing effort HEART: regular rate & rhythm and no murmurs and no lower extremity edema ABDOMEN:abdomen soft, non-tender and normal bowel  sounds Musculoskeletal:no cyanosis of digits and no clubbing  NEURO: alert & oriented x 3 with fluent speech, no focal motor/sensory deficits  LABORATORY DATA:  I have reviewed the data as listed    Component Value Date/Time   NA 140 07/08/2019 0915   K 4.0 07/08/2019 0915   CL 105 07/08/2019 0915   CO2 25 07/08/2019 0915   GLUCOSE 213 (H) 07/08/2019 0915   BUN 14 07/08/2019 0915   CREATININE 1.01 (H) 07/08/2019 0915   CREATININE 0.84 04/29/2019 0915   CREATININE 0.86 06/05/2014 0957   CALCIUM 9.2 07/08/2019 0915   PROT 6.8 07/08/2019 0915   ALBUMIN 3.7 07/08/2019 0915   AST 23 07/08/2019 0915   AST 22 04/29/2019 0915   ALT 15 07/08/2019 0915   ALT 18 04/29/2019 0915   ALKPHOS 64 07/08/2019 0915   BILITOT 0.4 07/08/2019 0915   BILITOT 0.6 04/29/2019 0915   GFRNONAA 57 (L) 07/08/2019 0915   GFRNONAA >60 04/29/2019 0915   GFRAA >60 07/08/2019 0915   GFRAA >60 04/29/2019 0915    No results found for: SPEP, UPEP  Lab Results  Component Value Date   WBC 6.2 07/08/2019   NEUTROABS 3.9 07/08/2019   HGB 11.7 (L) 07/08/2019   HCT 35.7 (L) 07/08/2019   MCV 109.2 (H) 07/08/2019   PLT 158 07/08/2019      Chemistry      Component Value Date/Time   NA 140 07/08/2019 0915   K 4.0 07/08/2019 0915   CL 105 07/08/2019 0915   CO2 25 07/08/2019 0915   BUN 14 07/08/2019 0915   CREATININE 1.01 (H) 07/08/2019 0915   CREATININE 0.84 04/29/2019 0915   CREATININE 0.86 06/05/2014 0957      Component Value Date/Time   CALCIUM 9.2 07/08/2019 0915   ALKPHOS 64 07/08/2019 0915   AST 23 07/08/2019 0915   AST 22 04/29/2019 0915   ALT 15 07/08/2019 0915   ALT 18 04/29/2019 0915   BILITOT 0.4 07/08/2019 0915   BILITOT 0.6 04/29/2019 0915

## 2019-07-08 NOTE — Assessment & Plan Note (Signed)
She has significant hyperglycemia recently I suspect this is due to poor dietary choices We discussed the importance of lifestyle modification and dietary management to treat her diabetes

## 2019-07-08 NOTE — Assessment & Plan Note (Signed)
Overall, she tolerated treatment very well without side effects Her tumor marker is pending We discussed the role of treatment will be for at least 2 years I will see her again in 6 months for further follow-up and reviewed the plan of care Once she has completed 2 years of treatment by October of this year, we can consider discontinuation of treatment and port removal

## 2019-07-09 LAB — CA 125: Cancer Antigen (CA) 125: 9.5 U/mL (ref 0.0–38.1)

## 2019-07-10 ENCOUNTER — Telehealth: Payer: Self-pay

## 2019-07-10 NOTE — Telephone Encounter (Signed)
-----   Message from [name], MD sent at 07/10/2019  8:10 AM EDT ----- Regarding: pls call and let her CA-125 is good.

## 2019-07-10 NOTE — Telephone Encounter (Signed)
Called and given below message. She verbalized understanding. 

## 2019-07-28 MED FILL — LYNPARZA 100 MG TAB: 100 | 30 days supply | Qty: 120 | Fill #5

## 2019-08-29 MED FILL — LYNPARZA 100 MG TAB: 100 | 30 days supply | Qty: 120 | Fill #6

## 2019-09-02 ENCOUNTER — Other Ambulatory Visit: Payer: Self-pay

## 2019-09-02 ENCOUNTER — Inpatient Hospital Stay: Payer: Medicare HMO

## 2019-09-02 ENCOUNTER — Inpatient Hospital Stay: Payer: Medicare HMO | Attending: Obstetrics

## 2019-09-02 DIAGNOSIS — Z8543 Personal history of malignant neoplasm of ovary: Secondary | ICD-10-CM | POA: Insufficient documentation

## 2019-09-02 DIAGNOSIS — C561 Malignant neoplasm of right ovary: Secondary | ICD-10-CM

## 2019-09-02 DIAGNOSIS — Z452 Encounter for adjustment and management of vascular access device: Secondary | ICD-10-CM | POA: Diagnosis not present

## 2019-09-02 LAB — CBC WITH DIFFERENTIAL/PLATELET
Abs Immature Granulocytes: 0.02 10*3/uL (ref 0.00–0.07)
Basophils Absolute: 0 10*3/uL (ref 0.0–0.1)
Basophils Relative: 1 %
Eosinophils Absolute: 0.1 10*3/uL (ref 0.0–0.5)
Eosinophils Relative: 2 %
HCT: 36.2 % (ref 36.0–46.0)
Hemoglobin: 11.8 g/dL — ABNORMAL LOW (ref 12.0–15.0)
Immature Granulocytes: 0 %
Lymphocytes Relative: 27 %
Lymphs Abs: 1.8 10*3/uL (ref 0.7–4.0)
MCH: 36 pg — ABNORMAL HIGH (ref 26.0–34.0)
MCHC: 32.6 g/dL (ref 30.0–36.0)
MCV: 110.4 fL — ABNORMAL HIGH (ref 80.0–100.0)
Monocytes Absolute: 0.4 10*3/uL (ref 0.1–1.0)
Monocytes Relative: 7 %
Neutro Abs: 4.2 10*3/uL (ref 1.7–7.7)
Neutrophils Relative %: 63 %
Platelets: 180 10*3/uL (ref 150–400)
RBC: 3.28 MIL/uL — ABNORMAL LOW (ref 3.87–5.11)
RDW: 15.6 % — ABNORMAL HIGH (ref 11.5–15.5)
WBC: 6.6 10*3/uL (ref 4.0–10.5)
nRBC: 0.3 % — ABNORMAL HIGH (ref 0.0–0.2)

## 2019-09-02 LAB — COMPREHENSIVE METABOLIC PANEL
ALT: 16 U/L (ref 0–44)
AST: 20 U/L (ref 15–41)
Albumin: 3.8 g/dL (ref 3.5–5.0)
Alkaline Phosphatase: 62 U/L (ref 38–126)
Anion gap: 9 (ref 5–15)
BUN: 12 mg/dL (ref 8–23)
CO2: 24 mmol/L (ref 22–32)
Calcium: 9.1 mg/dL (ref 8.9–10.3)
Chloride: 107 mmol/L (ref 98–111)
Creatinine, Ser: 1.03 mg/dL — ABNORMAL HIGH (ref 0.44–1.00)
GFR calc Af Amer: >60 mL/min (ref >60)
GFR calc non Af Amer: 55 mL/min — ABNORMAL LOW (ref >60)
Glucose, Bld: 159 mg/dL — ABNORMAL HIGH (ref 70–99)
Potassium: 4.4 mmol/L (ref 3.5–5.1)
Sodium: 140 mmol/L (ref 135–145)
Total Bilirubin: 0.4 mg/dL (ref 0.3–1.2)
Total Protein: 7.1 g/dL (ref 6.5–8.1)

## 2019-09-02 MED ORDER — SODIUM CHLORIDE 0.9% FLUSH
10.0000 mL | Freq: Once | INTRAVENOUS | Status: AC
  Administered 2019-09-02: 10 mL
  Filled 2019-09-02: qty 10

## 2019-09-02 MED ORDER — HEPARIN SOD (PORK) LOCK FLUSH 100 UNIT/ML IV SOLN
500.0000 [IU] | Freq: Once | INTRAVENOUS | Status: AC
  Administered 2019-09-02: 500 [IU]
  Filled 2019-09-02: qty 5

## 2019-09-03 ENCOUNTER — Telehealth: Payer: Self-pay

## 2019-09-03 LAB — CA 125: Cancer Antigen (CA) 125: 11 U/mL (ref 0.0–38.1)

## 2019-09-03 NOTE — Telephone Encounter (Signed)
-----   Message from [name], MD sent at 09/03/2019  7:36 AM EDT ----- Regarding: pls call and let he rknow CA-125 is stable, not to worry over the 2 point change

## 2019-09-03 NOTE — Telephone Encounter (Signed)
TC to Pt per Dr. Alvy Bimler relayed Dr. Alvy Bimler message. Pt asked for CA 125 value (11.0 ) was given Pt satisfied with information. No further problems or concerns noted.

## 2019-10-06 ENCOUNTER — Telehealth: Payer: Self-pay

## 2019-10-06 NOTE — Telephone Encounter (Signed)
Oral Oncology Patient Advocate Encounter  Was successful in securing patient a $3500 grant from [employer] to provide copayment coverage for [employer].  This will keep the out of pocket expense at $0.     [employer] ID: [id number]  I have spoken with the patient.   The billing information is as follows and has been shared with [name].    [employer]Bin: [id number] PCN: [id number] Member ID: [id number] Group ID: [id number] Dates of Eligibility: 09/06/19 through 09/04/20  Fund:  [name] Patient Sabrina Vang Phone [phone] Fax [phone] 10/06/2019 11:43 AM

## 2019-10-28 ENCOUNTER — Other Ambulatory Visit: Payer: Self-pay

## 2019-10-28 ENCOUNTER — Inpatient Hospital Stay: Payer: Medicare HMO | Attending: Obstetrics

## 2019-10-28 ENCOUNTER — Inpatient Hospital Stay: Payer: Medicare HMO

## 2019-10-28 DIAGNOSIS — Z8543 Personal history of malignant neoplasm of ovary: Secondary | ICD-10-CM | POA: Diagnosis not present

## 2019-10-28 DIAGNOSIS — C561 Malignant neoplasm of right ovary: Secondary | ICD-10-CM

## 2019-10-28 DIAGNOSIS — Z452 Encounter for adjustment and management of vascular access device: Secondary | ICD-10-CM | POA: Insufficient documentation

## 2019-10-28 LAB — CBC WITH DIFFERENTIAL/PLATELET
Abs Immature Granulocytes: 0.03 10*3/uL (ref 0.00–0.07)
Basophils Absolute: 0 10*3/uL (ref 0.0–0.1)
Basophils Relative: 1 %
Eosinophils Absolute: 0.2 10*3/uL (ref 0.0–0.5)
Eosinophils Relative: 3 %
HCT: 35.3 % — ABNORMAL LOW (ref 36.0–46.0)
Hemoglobin: 11.8 g/dL — ABNORMAL LOW (ref 12.0–15.0)
Immature Granulocytes: 1 %
Lymphocytes Relative: 28 %
Lymphs Abs: 1.8 10*3/uL (ref 0.7–4.0)
MCH: 36.6 pg — ABNORMAL HIGH (ref 26.0–34.0)
MCHC: 33.4 g/dL (ref 30.0–36.0)
MCV: 109.6 fL — ABNORMAL HIGH (ref 80.0–100.0)
Monocytes Absolute: 0.4 10*3/uL (ref 0.1–1.0)
Monocytes Relative: 7 %
Neutro Abs: 3.8 10*3/uL (ref 1.7–7.7)
Neutrophils Relative %: 60 %
Platelets: 186 10*3/uL (ref 150–400)
RBC: 3.22 MIL/uL — ABNORMAL LOW (ref 3.87–5.11)
RDW: 15.2 % (ref 11.5–15.5)
WBC: 6.3 10*3/uL (ref 4.0–10.5)
nRBC: 0 % (ref 0.0–0.2)

## 2019-10-28 LAB — COMPREHENSIVE METABOLIC PANEL
ALT: 15 U/L (ref 0–44)
AST: 22 U/L (ref 15–41)
Albumin: 3.7 g/dL (ref 3.5–5.0)
Alkaline Phosphatase: 63 U/L (ref 38–126)
Anion gap: 10 (ref 5–15)
BUN: 13 mg/dL (ref 8–23)
CO2: 23 mmol/L (ref 22–32)
Calcium: 9.3 mg/dL (ref 8.9–10.3)
Chloride: 108 mmol/L (ref 98–111)
Creatinine, Ser: 0.95 mg/dL (ref 0.44–1.00)
GFR calc Af Amer: >60 mL/min (ref >60)
GFR calc non Af Amer: >60 mL/min (ref >60)
Glucose, Bld: 130 mg/dL — ABNORMAL HIGH (ref 70–99)
Potassium: 4.3 mmol/L (ref 3.5–5.1)
Sodium: 141 mmol/L (ref 135–145)
Total Bilirubin: 0.3 mg/dL (ref 0.3–1.2)
Total Protein: 7.2 g/dL (ref 6.5–8.1)

## 2019-10-28 MED ORDER — HEPARIN SOD (PORK) LOCK FLUSH 100 UNIT/ML IV SOLN
500.0000 [IU] | Freq: Once | INTRAVENOUS | Status: AC
  Administered 2019-10-28: 500 [IU]
  Filled 2019-10-28: qty 5

## 2019-10-28 MED ORDER — SODIUM CHLORIDE 0.9% FLUSH
10.0000 mL | Freq: Once | INTRAVENOUS | Status: AC
  Administered 2019-10-28: 10 mL
  Filled 2019-10-28: qty 10

## 2019-10-29 ENCOUNTER — Telehealth: Payer: Self-pay

## 2019-10-29 LAB — CA 125: Cancer Antigen (CA) 125: 10.2 U/mL (ref 0.0–38.1)

## 2019-10-29 NOTE — Telephone Encounter (Signed)
TC to pt per Dr Alvy Bimler to let her know that her labs and CA 125 were stable. Let patient know that the CA 125 level was 10.2. patient verbalized understanding.

## 2019-11-06 MED FILL — LYNPARZA 100 MG TAB: 100 | 30 days supply | Qty: 120 | Fill #8

## 2019-12-01 MED FILL — LYNPARZA 100 MG TAB: 100 | 30 days supply | Qty: 120 | Fill #9

## 2019-12-11 ENCOUNTER — Telehealth: Payer: Self-pay | Admitting: Oncology

## 2019-12-11 NOTE — Telephone Encounter (Signed)
Sabrina Vang called and asked if she would qualify for the covid vaccine booster.  Advised her she does since she is taking [place]  Discussed that the booster can be given 28 days after her last vaccine and that she does not need to hold the [place]

## 2019-12-23 ENCOUNTER — Inpatient Hospital Stay: Payer: Medicare HMO

## 2019-12-23 ENCOUNTER — Inpatient Hospital Stay: Payer: Medicare HMO | Attending: Obstetrics | Admitting: Hematology and Oncology

## 2019-12-23 ENCOUNTER — Other Ambulatory Visit: Payer: Self-pay

## 2019-12-23 DIAGNOSIS — Z7984 Long term (current) use of oral hypoglycemic drugs: Secondary | ICD-10-CM | POA: Insufficient documentation

## 2019-12-23 DIAGNOSIS — C786 Secondary malignant neoplasm of retroperitoneum and peritoneum: Secondary | ICD-10-CM | POA: Insufficient documentation

## 2019-12-23 DIAGNOSIS — Z79899 Other long term (current) drug therapy: Secondary | ICD-10-CM | POA: Diagnosis not present

## 2019-12-23 DIAGNOSIS — Z9221 Personal history of antineoplastic chemotherapy: Secondary | ICD-10-CM | POA: Insufficient documentation

## 2019-12-23 DIAGNOSIS — Z452 Encounter for adjustment and management of vascular access device: Secondary | ICD-10-CM | POA: Insufficient documentation

## 2019-12-23 DIAGNOSIS — D6481 Anemia due to antineoplastic chemotherapy: Secondary | ICD-10-CM | POA: Diagnosis not present

## 2019-12-23 DIAGNOSIS — T451X5A Adverse effect of antineoplastic and immunosuppressive drugs, initial encounter: Secondary | ICD-10-CM | POA: Diagnosis not present

## 2019-12-23 DIAGNOSIS — C561 Malignant neoplasm of right ovary: Secondary | ICD-10-CM

## 2019-12-23 DIAGNOSIS — E119 Type 2 diabetes mellitus without complications: Secondary | ICD-10-CM | POA: Diagnosis not present

## 2019-12-23 LAB — COMPREHENSIVE METABOLIC PANEL
ALT: 18 U/L (ref 0–44)
AST: 21 U/L (ref 15–41)
Albumin: 3.8 g/dL (ref 3.5–5.0)
Alkaline Phosphatase: 61 U/L (ref 38–126)
Anion gap: 7 (ref 5–15)
BUN: 14 mg/dL (ref 8–23)
CO2: 28 mmol/L (ref 22–32)
Calcium: 10.5 mg/dL — ABNORMAL HIGH (ref 8.9–10.3)
Chloride: 103 mmol/L (ref 98–111)
Creatinine, Ser: 1.04 mg/dL — ABNORMAL HIGH (ref 0.44–1.00)
GFR calc Af Amer: >60 mL/min (ref >60)
GFR calc non Af Amer: 55 mL/min — ABNORMAL LOW (ref >60)
Glucose, Bld: 209 mg/dL — ABNORMAL HIGH (ref 70–99)
Potassium: 4.2 mmol/L (ref 3.5–5.1)
Sodium: 138 mmol/L (ref 135–145)
Total Bilirubin: 0.4 mg/dL (ref 0.3–1.2)
Total Protein: 7.2 g/dL (ref 6.5–8.1)

## 2019-12-23 LAB — CBC WITH DIFFERENTIAL/PLATELET
Abs Immature Granulocytes: 0.02 10*3/uL (ref 0.00–0.07)
Basophils Absolute: 0.1 10*3/uL (ref 0.0–0.1)
Basophils Relative: 1 %
Eosinophils Absolute: 0.2 10*3/uL (ref 0.0–0.5)
Eosinophils Relative: 3 %
HCT: 35.5 % — ABNORMAL LOW (ref 36.0–46.0)
Hemoglobin: 11.8 g/dL — ABNORMAL LOW (ref 12.0–15.0)
Immature Granulocytes: 0 %
Lymphocytes Relative: 28 %
Lymphs Abs: 2.1 10*3/uL (ref 0.7–4.0)
MCH: 36.4 pg — ABNORMAL HIGH (ref 26.0–34.0)
MCHC: 33.2 g/dL (ref 30.0–36.0)
MCV: 109.6 fL — ABNORMAL HIGH (ref 80.0–100.0)
Monocytes Absolute: 0.4 10*3/uL (ref 0.1–1.0)
Monocytes Relative: 6 %
Neutro Abs: 4.7 10*3/uL (ref 1.7–7.7)
Neutrophils Relative %: 62 %
Platelets: 177 10*3/uL (ref 150–400)
RBC: 3.24 MIL/uL — ABNORMAL LOW (ref 3.87–5.11)
RDW: 14.6 % (ref 11.5–15.5)
WBC: 7.5 10*3/uL (ref 4.0–10.5)
nRBC: 0.3 % — ABNORMAL HIGH (ref 0.0–0.2)

## 2019-12-23 MED ORDER — SODIUM CHLORIDE 0.9% FLUSH
10.0000 mL | Freq: Once | INTRAVENOUS | Status: AC
  Administered 2019-12-23: 10 mL
  Filled 2019-12-23: qty 10

## 2019-12-23 MED ORDER — HEPARIN SOD (PORK) LOCK FLUSH 100 UNIT/ML IV SOLN
250.0000 [IU] | Freq: Once | INTRAVENOUS | Status: AC
  Administered 2019-12-23: 500 [IU]
  Filled 2019-12-23: qty 5

## 2019-12-23 NOTE — Patient Instructions (Signed)

## 2019-12-24 ENCOUNTER — Encounter: Payer: Self-pay | Admitting: Hematology and Oncology

## 2019-12-24 ENCOUNTER — Telehealth: Payer: Self-pay

## 2019-12-24 LAB — CA 125: Cancer Antigen (CA) 125: 12.1 U/mL (ref 0.0–38.1)

## 2019-12-24 NOTE — Assessment & Plan Note (Signed)
This is likely anemia of chronic disease. The patient denies recent history of bleeding such as epistaxis, hematuria or hematochezia. She is asymptomatic from the anemia. We will observe for now.  She does not require transfusion now. I do not recommend any further work-up at this time.   

## 2019-12-24 NOTE — Assessment & Plan Note (Signed)
Her blood sugar is high we discussed the importance of dietary modification and weight loss

## 2019-12-24 NOTE — Assessment & Plan Note (Signed)
Overall, she tolerated treatment very well without side effects Her tumor marker is stable We discussed the role of treatment will be for at least 2 years; we discussed the risk and benefits of continuing treatment versus stopping for now, she is comfortable to continue on treatment I will see her again in 6 months for further follow-up and reviewed the plan of care she is educated to watch for signs and symptoms of cancer recurrence for now, I do not plan routine surveillance imaging study

## 2019-12-24 NOTE — Progress Notes (Signed)
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 The patient denies recent history of bleeding such as epistaxis, hematuria or hematochezia. She is asymptomatic from the anemia. We will observe for now.  She does not require transfusion now. I do not recommend any further work-up at this time.    Type 2 diabetes mellitus treated without insulin (HCC) Her blood sugar is high we discussed the importance of dietary modification and weight loss   No orders of the defined types were placed in this encounter.   All questions were answered. The patient knows to call the clinic with any problems, questions or concerns. The total time spent in the appointment was 20 minutes encounter with patients including review of chart and various tests results, discussions about plan of care and coordination of care plan   [name], MD 12/24/2019 9:05 AM  INTERVAL HISTORY: Please see below for problem oriented charting. 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 The left adnexa measured 12.7 x 11.6 x 8.1 with low level echoes and soft tissue nodules    07/30/2017 Tumor Marker   Patient's tumor was tested for the following markers: CA-125 Results of the tumor marker test revealed 521.3   08/17/2017 Pathology Results   The malignant cells are positive for PAX-8, cytokeratin 7, estrogen receptor, and faintly positive for GATA-3. They are negative for p53, GCDFP, and cytokeratin 20. The finding are consistent with a gynecologic primary carcinoma. Additional studies can be performed upon clinician request.   08/17/2017 Procedure   Technically successful CT-guided left lower quadrant omental mass core biopsy.   08/20/2017 PET scan   1. Cystic masses arising from the pelvis. The nodular component of the RIGHT cystic mass is intensely hypermetabolic consistent with malignant ovarian neoplasm. 2. Extensive hypermetabolic peritoneal thickening in the lower abdomen and upper pelvis, upper abdomen, and upper abdominal precordial fat and paradiaphragmatic fat. 3. Retroperitoneal nodal metastasis adjacent to the IVC at the level the kidneys. 4. No evidence of metastatic disease in the thorax other small effusion on the LEFT and nodal metastasis in the fat superior to the diaphragm. 5. Mild metabolic activity associated the distal esophagus is favored benign esophagitis.  08/20/2017 Imaging   CT chest 1. Bilateral cardiophrenic angle nodal metastasis. No additional findings to suggest metastatic disease to the chest. 2. Small left pleural  effusion. 3. Peritoneal carcinomatosis noted within the abdomen. 4. Hepatic steatosis.    08/24/2017 Cancer Staging   Staging form: Ovary, Fallopian Tube, and Primary Peritoneal Carcinoma, AJCC 8th Edition - Clinical: Stage IV (cT3, cN1, cM1) - Signed by [name], MD on 08/24/2017   08/31/2017 Tumor Marker   Patient's tumor was tested for the following markers: CA-125 Results of the tumor marker test revealed 819.9   09/26/2017 Adverse Reaction   She developed reaction to Taxol, managed successfully with additional premedications   10/17/2017 Tumor Marker   Patient's tumor was tested for the following markers: CA-125 Results of the tumor marker test revealed 374.4   10/31/2017 Imaging   1. No significant change in size of the large complex bilateral adnexal masses consistent with the known history of ovarian cancer. There is increased dependent density within the left-sided lesion. 2. Stable peritoneal carcinomatosis.  No significant ascites.  3. The bilateral pericardiac adenopathy has improved. No progressive thoracic metastatic disease. No pulmonary or osseous metastatic disease. 4. Suspicion of nonocclusive thrombus in the right internal jugular vein related to the right IJ port. Recommend further evaluation with Doppler ultrasound.   11/13/2017 Surgery   Preoperative Diagnosis: Ovarian cancer s/p neoadjuvant chemotherapy   Procedure(s) Performed:   1. Exploratory laparotomy  2. Bilateral salpingo-oophorectomy with radical tumor debulking for ovarian cancer   including retroperitoneal dissection, lysis of adhesions (enterolysis of small bowel in deep pelvis, left adnexal adhesions to sigmoid and omentum to LLQ) ~1 hour, ureterolysis. 3. Omentectomy  4. Takedown of splenic flexure with removal of omental tumor in left upper quadrant  Specimens: Bilateral tubes and ovaries, omentum, splenic flexure nodule, right paracolic gutter nodule, cecal nodule, right ureteral peritoneal  nodule, small bowel mesentary.  Indication for Procedure:  Patient is s/p 3 cycles of chemotherapy with response based on CA125 and decreased mediastinal disease (to <1cm).  Operative Findings:  This represented an optimal cytoreduction with gross residual disease remaining in the deep pelvis near the levator floor and possibly in the region of the right IP ligament/periappendicial region.  Upon entry a large ~20cm right tube/ovary, mostly cystic. Adherent rind along ileocecal region and appendix. Large cystic left tube/ovary ~10cm with sigmoid colon stretched across the mass and extension of the cystic lesion into the deep pelvis with residual palpable disease deep pelvis near levators. Estimate ~1cm or less on palpation, not visible disease. Omental caking in the LLQ adherent to pelvic sidewall. Omental disease noted RUQ separate. In addition ~1.5cm lesion in splenic flexure omentum. Evidence of prior diaphragmatic disease, no visible disease.      11/13/2017 Pathology Results   1. 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 - TUMOR INVOLVES THE OVARIAN SURFACE. - RIGHT FALLOPIAN TUBE IS INVOLVED. - SEE ONCOLOGY TABLE. - SEE NOTE. 2. Soft tissue mass, biopsy, cecal gutter nodule - METASTATIC MUCINOUS ADENOCARCINOMA. 3. Ovary and fallopian tube, left, left ovary and fallopian tube - METASTATIC MUCINOUS ADENOCARCINOMA TO LEFT OVARY AND FALLOPIAN TUBE. 4. Omentum, resection for tumor - METASTATIC MUCINOUS ADENOCARCINOMA TO OMENTUM. 5. Soft tissue mass, biopsy, splenic flexure nodule - METASTATIC MUCINOUS ADENOCARCINOMA. 6. Soft tissue mass, biopsy, cecal implant - METASTATIC MUCINOUS ADENOCARCINOMA. 7. Soft tissue mass, biopsy, right ureteral peritoneal implant - METASTATIC MUCINOUS ADENOCARCINOMA. 8. Mesentery, small bowel nodule - METASTATIC MUCINOUS  ADENOCARCINOMA. Microscopic Comment 1. OVARY or FALLOPIAN TUBE or PRIMARY  PERITONEUM: Procedure: Bilateral salpingo-oophorectomy. Specimen Integrity: Intact. Tumor Site: Right ovary. Ovarian Surface Involvement: Present. Fallopian Tube Surface Involvement: Present. Tumor Size: 20 cm. Histologic Type: Mucinous adenocarcinoma. Histologic Grade: Overall G2 (moderately differentiated) (focal areas of poor differentiation are present). Implants: Present. Other Tissue/ Organ Involvement: Left ovary, left fallopian tube and omentum. Largest Extrapelvic Peritoneal Focus: less than 2 cm. See note Peritoneal/Ascitic Fluid: N/A Treatment Effect: No definite or minimal response identified (chemotherapy response score 1 [CRS 1]) Regional Lymph Nodes No lymph nodes submitted or found Number of Lymph Nodes Examined: 0 Pathologic Stage Classification (pTNM, AJCC 8th Edition): ypT3b, ypNX. Representative Tumor Block: 1D and 1E. (NDK:gt, 11/15/17)   11/19/2017 Genetic Testing   Negative genetic testing on the [employer] Myrisk panel.  The [facility] gene panel offered by [employer] includes sequencing and deletion/duplication testing of the following 35 genes: APC, ATM, AXIN2, BARD1, BMPR1A, BRCA1, BRCA2, BRIP1, CHD1, CDK4, CDKN2A, CHEK2, EPCAM (large rearrangement only), HOXB13, (sequencing only), GALNT12, MLH1, MSH2, MSH3 (excluding repetitive portions of exon 1), MSH6, MUTYH, NBN, NTHL1, PALB2, PMS2, PTEN, RAD51C, RAD51D, RNF43, RPS20, SMAD4, STK11, and TP53. Sequencing was performed for select regions of POLE and POLD1, and large rearrangement analysis was performed for select regions of GREM1. The report date is November 19, 2017.  HRD tumor results indicate a BRCA1 mutation identified in the ovarian tumor causing genomic instability. The report date is 12/04/2017     Genetic Testing   Patient has genetic testing done for ER/PR and Her2/neu. Results revealed patient has the following: ER 15-20% PR 5-10% Her2/neu - negative    Genetic Testing   Patient has genetic  testing done for MSI/MMR. Results revealed patient has the following mutation(s): MMR normal, MSI stable   12/10/2017 Imaging   1. Interval resection of large bilateral adnexal cystic masses. Residual soft tissue/tumor within bilateral adnexal regions as above. 2. Interval resolution of previously noted omental cake which may reflect interval omentectomy. There is a new peritoneal deposit identified within the left upper quadrant of the abdomen involving the anterior surface of the spleen.   12/10/2017 Tumor Marker   Patient's tumor was tested for the following markers: CA-125 Results of the tumor marker test revealed 94.9   12/11/2017 - 02/20/2018 Chemotherapy   The patient had FOLFOX regimen for mucinous   01/09/2018 Tumor Marker   Patient's tumor was tested for the following markers: CA-125 Results of the tumor marker test revealed 37   01/31/2018 Imaging   Resolution of peritoneal soft tissue density along the anterior margin of the spleen since prior study. No evidence of residual or progressive metastatic disease.  Mild sigmoid diverticulosis, without radiographic evidence of diverticulitis. Resolution of small pericolonic abscess along the left lateral pelvic sidewall.  Mild hepatic steatosis.   02/06/2018 Tumor Marker   Patient's tumor was tested for the following markers: CA-125 Results of the tumor marker test revealed 36.7   03/06/2018 -  Chemotherapy   The patient is started on [name] as PARP maintenance   03/13/2018 Tumor Marker   Patient's tumor was tested for the following markers: CA-125 Results of the tumor marker test revealed 33.8   04/05/2018 Tumor Marker   Patient's tumor was tested for the following markers: CA-125 Results of the tumor marker test revealed 27.3   05/03/2018 Tumor Marker   Patient's tumor was tested for the following markers: CA-125 Results of the tumor marker test revealed 23.5   07/08/2018  Tumor Marker   Patient's tumor was tested for  the following markers: CA-125 Results of the tumor marker test revealed 19.9   09/02/2018 Tumor Marker   Patient's tumor was tested for the following markers: CA-125 Results of the tumor marker test revealed 17   10/28/2018 Imaging   1. 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 Repeat abdominal MRI with and without IV gadolinium with MRCP is recommended in 2 years to ensure continued stability. This recommendation follows ACR consensus guidelines: Management of Incidental Pancreatic Cysts: A White Paper of the ACR Incidental Findings Committee. [place] 6269;48:546-270. 2. Hepatic steatosis. 3. Additional incidental findings, as above.     10/28/2018 Tumor Marker   Patient's tumor was tested for the following markers: CA-125 Results of the tumor marker test revealed 16.9   03/03/2019 Tumor Marker   Patient's tumor was tested for the following markers: CA-125 Results of the tumor marker test revealed 12.7.   07/09/2019 Tumor Marker   Patient's tumor was tested for the following markers: CA-125 Results of the tumor marker test revealed 9.5.   09/02/2019 Tumor Marker   Patient's tumor was tested for the following markers: CA-125 Results of the tumor marker test revealed 11.   10/28/2019 Tumor Marker   Patient's tumor was tested for the following markers: CA-125 Results of the tumor marker test revealed 10.2   12/23/2019 Tumor Marker   Patient's tumor was tested for the following markers: CA-125 Results of the tumor marker test revealed 12.1     REVIEW OF SYSTEMS:   Constitutional: Denies fevers, chills or abnormal weight loss Eyes: Denies blurriness of vision Ears, nose, mouth, throat, and face: Denies mucositis or sore throat Respiratory: Denies cough, dyspnea or wheezes Cardiovascular: Denies palpitation, chest discomfort or lower extremity  swelling Gastrointestinal:  Denies nausea, heartburn or change in bowel habits Skin: Denies abnormal skin rashes Lymphatics: Denies new lymphadenopathy or easy bruising Neurological:Denies numbness, tingling or new weaknesses Behavioral/Psych: Mood is stable, no new changes  All other systems were reviewed with the patient and are negative.  I have reviewed the past medical history, past surgical history, social history and family history with the patient and they are unchanged from previous note.  ALLERGIES:  has No Known Allergies.  MEDICATIONS:  Current Outpatient Medications  Medication Sig Dispense Refill   acetaminophen (TYLENOL) 500 MG tablet Take 500 mg by mouth every 8 (eight) hours as needed for mild pain or moderate pain.     atenolol (TENORMIN) 25 MG tablet Take 25 mg by mouth at bedtime.      atorvastatin (LIPITOR) 20 MG tablet Take 20 mg by mouth daily.     CALCIUM-MAGNESIUM-VITAMIN D PO Take 1 tablet by mouth daily.     famotidine (PEPCID) 20 MG tablet Take 20 mg by mouth as needed for heartburn or indigestion.     lidocaine-prilocaine (EMLA) cream Apply 1 application topically as needed. 30 g 6   loratadine (CLARITIN) 10 MG tablet Take 10 mg by mouth daily as needed for allergies.     losartan (COZAAR) 50 MG tablet Take 50 mg by mouth at bedtime.     [name] 100 MG tablet TAKE 2 TABLETS (200 MG TOTAL) BY MOUTH 2 (TWO) TIMES DAILY. SWALLOW WHOLE. 120 tablet 11   metFORMIN (GLUCOPHAGE) 1000 MG tablet Take 500 mg by mouth 2 (two) times daily.   0  Multiple Vitamin (MULTIVITAMIN WITH MINERALS) TABS tablet Take 1 tablet by mouth daily.     No current facility-administered medications for this visit.    PHYSICAL EXAMINATION: ECOG PERFORMANCE STATUS: 0 - Asymptomatic  Vitals:   12/23/19 1001  BP: 121/64  Pulse: 66  Resp: 18  Temp: (!) 97.3 F (36.3 C)  SpO2: 100%   Filed Weights   12/23/19 1001  Weight: 158 lb 9.6 oz (71.9 kg)    GENERAL:alert, no  distress and comfortable SKIN: skin color, texture, turgor are normal, no rashes or significant lesions EYES: normal, Conjunctiva are pink and non-injected, sclera clear OROPHARYNX:no exudate, no erythema and lips, buccal mucosa, and tongue normal  NECK: supple, thyroid normal size, non-tender, without nodularity LYMPH:  no palpable lymphadenopathy in the cervical, axillary or inguinal LUNGS: clear to auscultation and percussion with normal breathing effort HEART: regular rate & rhythm and no murmurs and no lower extremity edema ABDOMEN:abdomen soft, non-tender and normal bowel sounds Musculoskeletal:no cyanosis of digits and no clubbing  NEURO: alert & oriented x 3 with fluent speech, no focal motor/sensory deficits  LABORATORY DATA:  I have reviewed the data as listed    Component Value Date/Time   NA 138 12/23/2019 0954   K 4.2 12/23/2019 0954   CL 103 12/23/2019 0954   CO2 28 12/23/2019 0954   GLUCOSE 209 (H) 12/23/2019 0954   BUN 14 12/23/2019 0954   CREATININE 1.04 (H) 12/23/2019 0954   CREATININE 0.84 04/29/2019 0915   CREATININE 0.86 06/05/2014 0957   CALCIUM 10.5 (H) 12/23/2019 0954   PROT 7.2 12/23/2019 0954   ALBUMIN 3.8 12/23/2019 0954   AST 21 12/23/2019 0954   AST 22 04/29/2019 0915   ALT 18 12/23/2019 0954   ALT 18 04/29/2019 0915   ALKPHOS 61 12/23/2019 0954   BILITOT 0.4 12/23/2019 0954   BILITOT 0.6 04/29/2019 0915   GFRNONAA 55 (L) 12/23/2019 0954   GFRNONAA >60 04/29/2019 0915   GFRAA >60 12/23/2019 0954   GFRAA >60 04/29/2019 0915    No results found for: SPEP, UPEP  Lab Results  Component Value Date   WBC 7.5 12/23/2019   NEUTROABS 4.7 12/23/2019   HGB 11.8 (L) 12/23/2019   HCT 35.5 (L) 12/23/2019   MCV 109.6 (H) 12/23/2019   PLT 177 12/23/2019      Chemistry      Component Value Date/Time   NA 138 12/23/2019 0954   K 4.2 12/23/2019 0954   CL 103 12/23/2019 0954   CO2 28 12/23/2019 0954   BUN 14 12/23/2019 0954   CREATININE 1.04 (H)  12/23/2019 0954   CREATININE 0.84 04/29/2019 0915   CREATININE 0.86 06/05/2014 0957      Component Value Date/Time   CALCIUM 10.5 (H) 12/23/2019 0954   ALKPHOS 61 12/23/2019 0954   AST 21 12/23/2019 0954   AST 22 04/29/2019 0915   ALT 18 12/23/2019 0954   ALT 18 04/29/2019 0915   BILITOT 0.4 12/23/2019 0954   BILITOT 0.6 04/29/2019 0915

## 2019-12-24 NOTE — Telephone Encounter (Signed)
Called pt per Dr Alvy Bimler for results. Pt verbalized thanks and understanding.

## 2019-12-24 NOTE — Telephone Encounter (Signed)
-----   Message from [name], MD sent at 12/24/2019  8:37 AM EDT ----- Regarding: let her know labs and CA-125 are stable

## 2019-12-25 ENCOUNTER — Telehealth: Payer: Self-pay

## 2019-12-25 NOTE — Telephone Encounter (Signed)
Oral Oncology Patient Advocate Encounter   Was successful in securing patient a $4000 grant from [employer] (PA[employer]) to provide copayment coverage for [name].  This will keep the out of pocket expense at $0.     I have spoken with the patient.    The billing information is as follows and has been shared with [place]: [id number] PCN:  [id number] Member ID: [id number] Group ID: [id number] Dates of Eligibility: 12/25/19 through 12/24/20  [place] [facility] [place] Phone [phone] Fax [phone] 12/25/2019 2:34 PM

## 2019-12-31 MED FILL — LYNPARZA 100 MG TAB: 100 | 30 days supply | Qty: 120 | Fill #10

## 2020-01-01 ENCOUNTER — Other Ambulatory Visit (HOSPITAL_COMMUNITY): Payer: Self-pay | Admitting: Internal Medicine

## 2020-01-01 DIAGNOSIS — Z1231 Encounter for screening mammogram for malignant neoplasm of breast: Secondary | ICD-10-CM

## 2020-01-22 ENCOUNTER — Telehealth: Payer: Self-pay | Admitting: Oncology

## 2020-01-22 NOTE — Telephone Encounter (Signed)
Sabrina Vang called and said the port a cath bracelet that she was given when she had her port placed is broken and she is wondering if she can get another one.  Called IR and they do not have any extras.  Called Sabrina Vang back and advised her that we do not have any replacements and to keep her bracelet in her purse or bring it with her as needed.  She verbalized understanding.

## 2020-01-28 MED FILL — LYNPARZA 100 MG TAB: 100 | 30 days supply | Qty: 120 | Fill #11

## 2020-02-17 ENCOUNTER — Inpatient Hospital Stay: Payer: Medicare HMO

## 2020-02-17 ENCOUNTER — Other Ambulatory Visit: Payer: Self-pay

## 2020-02-17 ENCOUNTER — Inpatient Hospital Stay: Payer: Medicare HMO | Attending: Obstetrics

## 2020-02-17 DIAGNOSIS — C561 Malignant neoplasm of right ovary: Secondary | ICD-10-CM

## 2020-02-17 DIAGNOSIS — Z452 Encounter for adjustment and management of vascular access device: Secondary | ICD-10-CM | POA: Diagnosis not present

## 2020-02-17 LAB — CBC WITH DIFFERENTIAL/PLATELET
Abs Immature Granulocytes: 0.02 10*3/uL (ref 0.00–0.07)
Basophils Absolute: 0 10*3/uL (ref 0.0–0.1)
Basophils Relative: 1 %
Eosinophils Absolute: 0.1 10*3/uL (ref 0.0–0.5)
Eosinophils Relative: 2 %
HCT: 35.6 % — ABNORMAL LOW (ref 36.0–46.0)
Hemoglobin: 12 g/dL (ref 12.0–15.0)
Immature Granulocytes: 0 %
Lymphocytes Relative: 29 %
Lymphs Abs: 1.8 10*3/uL (ref 0.7–4.0)
MCH: 36.1 pg — ABNORMAL HIGH (ref 26.0–34.0)
MCHC: 33.7 g/dL (ref 30.0–36.0)
MCV: 107.2 fL — ABNORMAL HIGH (ref 80.0–100.0)
Monocytes Absolute: 0.4 10*3/uL (ref 0.1–1.0)
Monocytes Relative: 7 %
Neutro Abs: 3.8 10*3/uL (ref 1.7–7.7)
Neutrophils Relative %: 61 %
Platelets: 168 10*3/uL (ref 150–400)
RBC: 3.32 MIL/uL — ABNORMAL LOW (ref 3.87–5.11)
RDW: 14.8 % (ref 11.5–15.5)
WBC: 6.2 10*3/uL (ref 4.0–10.5)
nRBC: 0 % (ref 0.0–0.2)

## 2020-02-17 LAB — COMPREHENSIVE METABOLIC PANEL
ALT: 19 U/L (ref 0–44)
AST: 21 U/L (ref 15–41)
Albumin: 3.9 g/dL (ref 3.5–5.0)
Alkaline Phosphatase: 60 U/L (ref 38–126)
Anion gap: 9 (ref 5–15)
BUN: 14 mg/dL (ref 8–23)
CO2: 24 mmol/L (ref 22–32)
Calcium: 9.7 mg/dL (ref 8.9–10.3)
Chloride: 106 mmol/L (ref 98–111)
Creatinine, Ser: 1.01 mg/dL — ABNORMAL HIGH (ref 0.44–1.00)
GFR, Estimated: >60 mL/min (ref >60)
Glucose, Bld: 154 mg/dL — ABNORMAL HIGH (ref 70–99)
Potassium: 4.3 mmol/L (ref 3.5–5.1)
Sodium: 139 mmol/L (ref 135–145)
Total Bilirubin: 0.5 mg/dL (ref 0.3–1.2)
Total Protein: 7.1 g/dL (ref 6.5–8.1)

## 2020-02-17 MED ORDER — HEPARIN SOD (PORK) LOCK FLUSH 100 UNIT/ML IV SOLN
500.0000 [IU] | Freq: Once | INTRAVENOUS | Status: AC
  Administered 2020-02-17: 500 [IU]
  Filled 2020-02-17: qty 5

## 2020-02-17 MED ORDER — SODIUM CHLORIDE 0.9% FLUSH
10.0000 mL | Freq: Once | INTRAVENOUS | Status: AC
  Administered 2020-02-17: 10 mL
  Filled 2020-02-17: qty 10

## 2020-02-18 ENCOUNTER — Ambulatory Visit (HOSPITAL_COMMUNITY)
Admission: RE | Admit: 2020-02-18 | Discharge: 2020-02-18 | Disposition: A | Discharge status: Home / Self Care | Payer: Medicare HMO | Source: Ambulatory Visit | Attending: Internal Medicine | Admitting: Internal Medicine

## 2020-02-18 ENCOUNTER — Other Ambulatory Visit: Payer: Self-pay | Admitting: Hematology and Oncology

## 2020-02-18 ENCOUNTER — Ambulatory Visit (HOSPITAL_COMMUNITY): Payer: Medicare HMO

## 2020-02-18 ENCOUNTER — Telehealth: Payer: Self-pay

## 2020-02-18 DIAGNOSIS — Z1231 Encounter for screening mammogram for malignant neoplasm of breast: Secondary | ICD-10-CM | POA: Insufficient documentation

## 2020-02-18 DIAGNOSIS — C561 Malignant neoplasm of right ovary: Secondary | ICD-10-CM

## 2020-02-18 LAB — CA 125: Cancer Antigen (CA) 125: 9.5 U/mL (ref 0.0–38.1)

## 2020-02-18 NOTE — Telephone Encounter (Signed)
-----   Message from [name], MD sent at 02/18/2020 10:14 AM EDT ----- Regarding: pls let her know labs and CA-125 is good

## 2020-02-18 NOTE — Telephone Encounter (Signed)
Called and given below message. She verbalized understanding. 

## 2020-02-25 MED FILL — LYNPARZA 100 MG TAB: 100 | 30 days supply | Qty: 120 | Fill #0

## 2020-03-25 MED FILL — LYNPARZA 100 MG TAB: 100 | 30 days supply | Qty: 120 | Fill #1

## 2020-04-13 ENCOUNTER — Telehealth: Payer: Self-pay | Admitting: Oncology

## 2020-04-13 ENCOUNTER — Inpatient Hospital Stay: Payer: Medicare HMO

## 2020-04-13 ENCOUNTER — Other Ambulatory Visit: Payer: Self-pay

## 2020-04-13 ENCOUNTER — Encounter: Payer: Self-pay | Admitting: Hematology and Oncology

## 2020-04-13 ENCOUNTER — Inpatient Hospital Stay: Payer: Medicare HMO | Attending: Obstetrics | Admitting: Hematology and Oncology

## 2020-04-13 VITALS — BP 139/70 | HR 72 | Temp 97.2°F | Resp 16 | Ht 65.0 in | Wt 161.6 lb

## 2020-04-13 DIAGNOSIS — K862 Cyst of pancreas: Secondary | ICD-10-CM | POA: Diagnosis not present

## 2020-04-13 DIAGNOSIS — C561 Malignant neoplasm of right ovary: Secondary | ICD-10-CM

## 2020-04-13 DIAGNOSIS — E119 Type 2 diabetes mellitus without complications: Secondary | ICD-10-CM

## 2020-04-13 DIAGNOSIS — Z452 Encounter for adjustment and management of vascular access device: Secondary | ICD-10-CM | POA: Insufficient documentation

## 2020-04-13 LAB — CBC WITH DIFFERENTIAL/PLATELET
Abs Immature Granulocytes: 0.02 10*3/uL (ref 0.00–0.07)
Basophils Absolute: 0 10*3/uL (ref 0.0–0.1)
Basophils Relative: 1 %
Eosinophils Absolute: 0.1 10*3/uL (ref 0.0–0.5)
Eosinophils Relative: 2 %
HCT: 36.5 % (ref 36.0–46.0)
Hemoglobin: 12.1 g/dL (ref 12.0–15.0)
Immature Granulocytes: 0 %
Lymphocytes Relative: 30 %
Lymphs Abs: 1.9 10*3/uL (ref 0.7–4.0)
MCH: 36.3 pg — ABNORMAL HIGH (ref 26.0–34.0)
MCHC: 33.2 g/dL (ref 30.0–36.0)
MCV: 109.6 fL — ABNORMAL HIGH (ref 80.0–100.0)
Monocytes Absolute: 0.4 10*3/uL (ref 0.1–1.0)
Monocytes Relative: 6 %
Neutro Abs: 3.8 10*3/uL (ref 1.7–7.7)
Neutrophils Relative %: 61 %
Platelets: 168 10*3/uL (ref 150–400)
RBC: 3.33 MIL/uL — ABNORMAL LOW (ref 3.87–5.11)
RDW: 14.6 % (ref 11.5–15.5)
WBC: 6.2 10*3/uL (ref 4.0–10.5)
nRBC: 0 % (ref 0.0–0.2)

## 2020-04-13 LAB — COMPREHENSIVE METABOLIC PANEL
ALT: 18 U/L (ref 0–44)
AST: 23 U/L (ref 15–41)
Albumin: 3.8 g/dL (ref 3.5–5.0)
Alkaline Phosphatase: 59 U/L (ref 38–126)
Anion gap: 9 (ref 5–15)
BUN: 15 mg/dL (ref 8–23)
CO2: 24 mmol/L (ref 22–32)
Calcium: 9.4 mg/dL (ref 8.9–10.3)
Chloride: 107 mmol/L (ref 98–111)
Creatinine, Ser: 1.13 mg/dL — ABNORMAL HIGH (ref 0.44–1.00)
GFR, Estimated: 53 mL/min — ABNORMAL LOW (ref >60)
Glucose, Bld: 151 mg/dL — ABNORMAL HIGH (ref 70–99)
Potassium: 4.3 mmol/L (ref 3.5–5.1)
Sodium: 140 mmol/L (ref 135–145)
Total Bilirubin: 0.5 mg/dL (ref 0.3–1.2)
Total Protein: 7.2 g/dL (ref 6.5–8.1)

## 2020-04-13 MED ORDER — SODIUM CHLORIDE 0.9% FLUSH
10.0000 mL | Freq: Once | INTRAVENOUS | Status: AC
  Administered 2020-04-13: 10 mL
  Filled 2020-04-13: qty 10

## 2020-04-13 MED ORDER — HEPARIN SOD (PORK) LOCK FLUSH 100 UNIT/ML IV SOLN
500.0000 [IU] | Freq: Once | INTRAVENOUS | Status: AC
  Administered 2020-04-13: 500 [IU]
  Filled 2020-04-13: qty 5

## 2020-04-13 NOTE — Assessment & Plan Note (Signed)
Overall, she tolerated treatment very well without side effects Her tumor marker is stable We discussed the role of treatment will be for at least 2 years; we discussed the risk and benefits of continuing treatment versus stopping for now, she is comfortable to continue on treatment I will see her again in 3 months for further follow-up and reviewed the plan of care she is educated to watch for signs and symptoms of cancer recurrence for now, I do not plan routine surveillance imaging study Recommend GYN oncologist to see her in the next month or so for pelvic

## 2020-04-13 NOTE — Assessment & Plan Note (Signed)
Her last MRI of the abdomen in July 2020 was stable She would need another repeat imaging in July 2022 

## 2020-04-13 NOTE — Assessment & Plan Note (Signed)
Her blood sugar is high we discussed the importance of dietary modification and weight loss 

## 2020-04-13 NOTE — Telephone Encounter (Signed)
Left a message with an appointment to see Dr. Berline Lopes on 04/30/20 at 1:45.

## 2020-04-13 NOTE — Progress Notes (Signed)
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[place]) Overall, she tolerated treatment very well without side effects Her tumor marker is stable We discussed the role of treatment will be for at least 2 years; we discussed the risk and benefits of continuing treatment versus stopping for now, she is comfortable to continue on treatment I will see her again in 3 months for further follow-up and reviewed the plan of care she is educated to watch for signs and symptoms of cancer recurrence for now, I do not plan routine surveillance imaging study Recommend GYN oncologist to see her in the next month or so for pelvic  Type 2 diabetes mellitus treated without insulin ([place]) Her blood sugar is high we discussed the importance of dietary modification and weight loss  Pancreatic cyst Her last MRI of the abdomen in July 2020 was stable She would need another repeat imaging in July 2022   Orders Placed This Encounter  Procedures  . CA 125    Standing Status:   Standing    Number of Occurrences:   11    Standing Expiration Date:   04/13/2021    All questions were answered. The patient knows to call the clinic with any problems, questions or concerns. The total time spent in the appointment was 20 minutes encounter with patients including review of chart and various tests results, discussions about plan of care and coordination of care plan   [name], MD 04/13/2020 9:59 AM  INTERVAL HISTORY: Please see below for problem oriented charting. 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 The left adnexa measured 12.7 x 11.6 x 8.1 with low level echoes and soft tissue nodules    07/30/2017 Tumor Marker   Patient's tumor was tested for the following markers: CA-125 Results of the tumor marker test revealed 521.3   08/17/2017 Pathology Results   The malignant cells are positive for PAX-8, cytokeratin 7, estrogen receptor, and faintly positive for GATA-3. They are negative for p53, GCDFP, and cytokeratin 20. The finding are consistent with a gynecologic primary carcinoma. Additional studies can be performed upon clinician request.   08/17/2017 Procedure   Technically successful CT-guided left lower quadrant omental mass core biopsy.   08/20/2017 PET scan   1. Cystic masses arising from the pelvis. The nodular component of the RIGHT cystic mass is intensely hypermetabolic consistent with malignant ovarian neoplasm. 2. Extensive hypermetabolic peritoneal thickening in the lower abdomen and upper pelvis, upper abdomen, and upper abdominal precordial fat and paradiaphragmatic fat. 3. Retroperitoneal nodal metastasis adjacent to the IVC at the level the kidneys. 4. No evidence of metastatic disease in the thorax other small effusion on the LEFT  and nodal metastasis in the fat superior to the diaphragm. 5. Mild metabolic activity associated the distal esophagus is favored benign esophagitis.   08/20/2017 Imaging   CT chest 1. Bilateral cardiophrenic angle nodal metastasis. No additional findings to suggest metastatic disease to the chest. 2. Small left  pleural effusion. 3. Peritoneal carcinomatosis noted within the abdomen. 4. Hepatic steatosis.    08/24/2017 Cancer Staging   Staging form: Ovary, Fallopian Tube, and Primary Peritoneal Carcinoma, AJCC 8th Edition - Clinical: Stage IV (cT3, cN1, cM1) - Signed by [name], MD on 08/24/2017   08/31/2017 Tumor Marker   Patient's tumor was tested for the following markers: CA-125 Results of the tumor marker test revealed 819.9   09/26/2017 Adverse Reaction   She developed reaction to Taxol, managed successfully with additional premedications   10/17/2017 Tumor Marker   Patient's tumor was tested for the following markers: CA-125 Results of the tumor marker test revealed 374.4   10/31/2017 Imaging   1. No significant change in size of the large complex bilateral adnexal masses consistent with the known history of ovarian cancer. There is increased dependent density within the left-sided lesion. 2. Stable peritoneal carcinomatosis.  No significant ascites.  3. The bilateral pericardiac adenopathy has improved. No progressive thoracic metastatic disease. No pulmonary or osseous metastatic disease. 4. Suspicion of nonocclusive thrombus in the right internal jugular vein related to the right IJ port. Recommend further evaluation with Doppler ultrasound.   11/13/2017 Surgery   Preoperative Diagnosis: Ovarian cancer s/p neoadjuvant chemotherapy   Procedure(s) Performed:   1. Exploratory laparotomy  2. Bilateral salpingo-oophorectomy with radical tumor debulking for ovarian cancer   including retroperitoneal dissection, lysis of adhesions (enterolysis of small bowel in deep pelvis, left adnexal adhesions to sigmoid and omentum to LLQ) ~1 hour, ureterolysis. 3. Omentectomy  4. Takedown of splenic flexure with removal of omental tumor in left upper quadrant  Specimens: Bilateral tubes and ovaries, omentum, splenic flexure nodule, right paracolic gutter nodule, cecal nodule, right ureteral  peritoneal nodule, small bowel mesentary.  Indication for Procedure:  Patient is s/p 3 cycles of chemotherapy with response based on CA125 and decreased mediastinal disease (to <1cm).  Operative Findings:  This represented an optimal cytoreduction with gross residual disease remaining in the deep pelvis near the levator floor and possibly in the region of the right IP ligament/periappendicial region.  Upon entry a large ~20cm right tube/ovary, mostly cystic. Adherent rind along ileocecal region and appendix. Large cystic left tube/ovary ~10cm with sigmoid colon stretched across the mass and extension of the cystic lesion into the deep pelvis with residual palpable disease deep pelvis near levators. Estimate ~1cm or less on palpation, not visible disease. Omental caking in the LLQ adherent to pelvic sidewall. Omental disease noted RUQ separate. In addition ~1.5cm lesion in splenic flexure omentum. Evidence of prior diaphragmatic disease, no visible disease.      11/13/2017 Pathology Results   1. 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 - TUMOR INVOLVES THE OVARIAN SURFACE. - RIGHT FALLOPIAN TUBE IS INVOLVED. - SEE ONCOLOGY TABLE. - SEE NOTE. 2. Soft tissue mass, biopsy, cecal gutter nodule - METASTATIC MUCINOUS ADENOCARCINOMA. 3. Ovary and fallopian tube, left, left ovary and fallopian tube - METASTATIC MUCINOUS ADENOCARCINOMA TO LEFT OVARY AND FALLOPIAN TUBE. 4. Omentum, resection for tumor - METASTATIC MUCINOUS ADENOCARCINOMA TO OMENTUM. 5. Soft tissue mass, biopsy, splenic flexure nodule - METASTATIC MUCINOUS ADENOCARCINOMA. 6. Soft tissue mass, biopsy, cecal implant -  METASTATIC MUCINOUS ADENOCARCINOMA. 7. Soft tissue mass, biopsy, right ureteral peritoneal implant - METASTATIC MUCINOUS ADENOCARCINOMA. 8. Mesentery, small bowel nodule - METASTATIC MUCINOUS ADENOCARCINOMA. Microscopic Comment 1. OVARY or FALLOPIAN TUBE or  PRIMARY PERITONEUM: Procedure: Bilateral salpingo-oophorectomy. Specimen Integrity: Intact. Tumor Site: Right ovary. Ovarian Surface Involvement: Present. Fallopian Tube Surface Involvement: Present. Tumor Size: 20 cm. Histologic Type: Mucinous adenocarcinoma. Histologic Grade: Overall G2 (moderately differentiated) (focal areas of poor differentiation are present). Implants: Present. Other Tissue/ Organ Involvement: Left ovary, left fallopian tube and omentum. Largest Extrapelvic Peritoneal Focus: less than 2 cm. See note Peritoneal/Ascitic Fluid: N/A Treatment Effect: No definite or minimal response identified (chemotherapy response score 1 [CRS 1]) Regional Lymph Nodes No lymph nodes submitted or found Number of Lymph Nodes Examined: 0 Pathologic Stage Classification (pTNM, AJCC 8th Edition): ypT3b, ypNX. Representative Tumor Block: 1D and 1E. (NDK:gt, 11/15/17)   11/19/2017 Genetic Testing   Negative genetic testing on the [employer] Myrisk panel.  The [facility] gene panel offered by [employer] includes sequencing and deletion/duplication testing of the following 35 genes: APC, ATM, AXIN2, BARD1, BMPR1A, BRCA1, BRCA2, BRIP1, CHD1, CDK4, CDKN2A, CHEK2, EPCAM (large rearrangement only), HOXB13, (sequencing only), GALNT12, MLH1, MSH2, MSH3 (excluding repetitive portions of exon 1), MSH6, MUTYH, NBN, NTHL1, PALB2, PMS2, PTEN, RAD51C, RAD51D, RNF43, RPS20, SMAD4, STK11, and TP53. Sequencing was performed for select regions of POLE and POLD1, and large rearrangement analysis was performed for select regions of GREM1. The report date is November 19, 2017.  HRD tumor results indicate a BRCA1 mutation identified in the ovarian tumor causing genomic instability. The report date is 12/04/2017     Genetic Testing   Patient has genetic testing done for ER/PR and Her2/neu. Results revealed patient has the following: ER 15-20% PR 5-10% Her2/neu - negative    Genetic Testing   Patient has  genetic testing done for MSI/MMR. Results revealed patient has the following mutation(s): MMR normal, MSI stable   12/10/2017 Imaging   1. Interval resection of large bilateral adnexal cystic masses. Residual soft tissue/tumor within bilateral adnexal regions as above. 2. Interval resolution of previously noted omental cake which may reflect interval omentectomy. There is a new peritoneal deposit identified within the left upper quadrant of the abdomen involving the anterior surface of the spleen.   12/10/2017 Tumor Marker   Patient's tumor was tested for the following markers: CA-125 Results of the tumor marker test revealed 94.9   12/11/2017 - 02/20/2018 Chemotherapy   The patient had FOLFOX regimen for mucinous   01/09/2018 Tumor Marker   Patient's tumor was tested for the following markers: CA-125 Results of the tumor marker test revealed 37   01/31/2018 Imaging   Resolution of peritoneal soft tissue density along the anterior margin of the spleen since prior study. No evidence of residual or progressive metastatic disease.  Mild sigmoid diverticulosis, without radiographic evidence of diverticulitis. Resolution of small pericolonic abscess along the left lateral pelvic sidewall.  Mild hepatic steatosis.   02/06/2018 Tumor Marker   Patient's tumor was tested for the following markers: CA-125 Results of the tumor marker test revealed 36.7   03/06/2018 -  Chemotherapy   The patient is started on [name] as PARP maintenance   03/13/2018 Tumor Marker   Patient's tumor was tested for the following markers: CA-125 Results of the tumor marker test revealed 33.8   04/05/2018 Tumor Marker   Patient's tumor was tested for the following markers: CA-125 Results of the tumor marker test revealed 27.3   05/03/2018  Tumor Marker   Patient's tumor was tested for the following markers: CA-125 Results of the tumor marker test revealed 23.5   07/08/2018 Tumor Marker   Patient's tumor was  tested for the following markers: CA-125 Results of the tumor marker test revealed 19.9   09/02/2018 Tumor Marker   Patient's tumor was tested for the following markers: CA-125 Results of the tumor marker test revealed 17   10/28/2018 Imaging   1. 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 Repeat abdominal MRI with and without IV gadolinium with MRCP is recommended in 2 years to ensure continued stability. This recommendation follows ACR consensus guidelines: Management of Incidental Pancreatic Cysts: A White Paper of the ACR Incidental Findings Committee. [place] 7782;42:353-614. 2. Hepatic steatosis. 3. Additional incidental findings, as above.     10/28/2018 Tumor Marker   Patient's tumor was tested for the following markers: CA-125 Results of the tumor marker test revealed 16.9   03/03/2019 Tumor Marker   Patient's tumor was tested for the following markers: CA-125 Results of the tumor marker test revealed 12.7.   07/09/2019 Tumor Marker   Patient's tumor was tested for the following markers: CA-125 Results of the tumor marker test revealed 9.5.   09/02/2019 Tumor Marker   Patient's tumor was tested for the following markers: CA-125 Results of the tumor marker test revealed 11.   10/28/2019 Tumor Marker   Patient's tumor was tested for the following markers: CA-125 Results of the tumor marker test revealed 10.2   12/23/2019 Tumor Marker   Patient's tumor was tested for the following markers: CA-125 Results of the tumor marker test revealed 12.1   02/17/2020 Tumor Marker   Patient's tumor was tested for the following markers: CA-125 Results of the tumor marker test revealed 9.5     REVIEW OF SYSTEMS:   Constitutional: Denies fevers, chills or abnormal weight loss Eyes: Denies blurriness of vision Ears, nose, mouth, throat, and face:  Denies mucositis or sore throat Respiratory: Denies cough, dyspnea or wheezes Cardiovascular: Denies palpitation, chest discomfort or lower extremity swelling Gastrointestinal:  Denies nausea, heartburn or change in bowel habits Skin: Denies abnormal skin rashes Lymphatics: Denies new lymphadenopathy or easy bruising Neurological:Denies numbness, tingling or new weaknesses Behavioral/Psych: Mood is stable, no new changes  All other systems were reviewed with the patient and are negative.  I have reviewed the past medical history, past surgical history, social history and family history with the patient and they are unchanged from previous note.  ALLERGIES:  has No Known Allergies.  MEDICATIONS:  Current Outpatient Medications  Medication Sig Dispense Refill  . acetaminophen (TYLENOL) 500 MG tablet Take 500 mg by mouth every 8 (eight) hours as needed for mild pain or moderate pain.    Marland Kitchen atenolol (TENORMIN) 25 MG tablet Take 25 mg by mouth at bedtime.     Marland Kitchen atorvastatin (LIPITOR) 20 MG tablet Take 20 mg by mouth daily.    Marland Kitchen CALCIUM-MAGNESIUM-VITAMIN D PO Take 1 tablet by mouth daily.    . famotidine (PEPCID) 20 MG tablet Take 20 mg by mouth as needed for heartburn or indigestion.    . lidocaine-prilocaine (EMLA) cream Apply 1 application topically as needed. 30 g 6  . loratadine (CLARITIN) 10 MG tablet Take 10 mg by mouth daily as needed for allergies.    Marland Kitchen losartan (COZAAR) 50 MG tablet Take 50 mg  by mouth at bedtime.    Marland Kitchen [name] 100 MG tablet TAKE 2 TABLETS (200 MG TOTAL) BY MOUTH 2 (TWO) TIMES DAILY. SWALLOW WHOLE. 120 tablet 11  . metFORMIN (GLUCOPHAGE) 1000 MG tablet Take 500 mg by mouth 2 (two) times daily.   0  . Multiple Vitamin (MULTIVITAMIN WITH MINERALS) TABS tablet Take 1 tablet by mouth daily.     No current facility-administered medications for this visit.    PHYSICAL EXAMINATION: ECOG PERFORMANCE STATUS: 1 - Symptomatic but completely ambulatory  Vitals:   04/13/20  0947 04/13/20 0953  BP: 139/70 139/70  Pulse: 72 72  Resp: 16 16  Temp: (!) 97.2 F (36.2 C) (!) 97.2 F (36.2 C)  SpO2: 98% 98%   Filed Weights   04/13/20 0947 04/13/20 0953  Weight: 161 lb 9.6 oz (73.3 kg) 161 lb 9.6 oz (73.3 kg)    GENERAL:alert, no distress and comfortable SKIN: skin color, texture, turgor are normal, no rashes or significant lesions EYES: normal, Conjunctiva are pink and non-injected, sclera clear OROPHARYNX:no exudate, no erythema and lips, buccal mucosa, and tongue normal  NECK: supple, thyroid normal size, non-tender, without nodularity LYMPH:  no palpable lymphadenopathy in the cervical, axillary or inguinal LUNGS: clear to auscultation and percussion with normal breathing effort HEART: regular rate & rhythm and no murmurs and no lower extremity edema ABDOMEN:abdomen soft, non-tender and normal bowel sounds Musculoskeletal:no cyanosis of digits and no clubbing  NEURO: alert & oriented x 3 with fluent speech, no focal motor/sensory deficits  LABORATORY DATA:  I have reviewed the data as listed    Component Value Date/Time   NA 139 02/17/2020 0937   K 4.3 02/17/2020 0937   CL 106 02/17/2020 0937   CO2 24 02/17/2020 0937   GLUCOSE 154 (H) 02/17/2020 0937   BUN 14 02/17/2020 0937   CREATININE 1.01 (H) 02/17/2020 0937   CREATININE 0.84 04/29/2019 0915   CREATININE 0.86 06/05/2014 0957   CALCIUM 9.7 02/17/2020 0937   PROT 7.1 02/17/2020 0937   ALBUMIN 3.9 02/17/2020 0937   AST 21 02/17/2020 0937   AST 22 04/29/2019 0915   ALT 19 02/17/2020 0937   ALT 18 04/29/2019 0915   ALKPHOS 60 02/17/2020 0937   BILITOT 0.5 02/17/2020 0937   BILITOT 0.6 04/29/2019 0915   GFRNONAA >60 02/17/2020 0937   GFRNONAA >60 04/29/2019 0915   GFRAA >60 12/23/2019 0954   GFRAA >60 04/29/2019 0915    No results found for: SPEP, UPEP  Lab Results  Component Value Date   WBC 6.2 04/13/2020   NEUTROABS 3.8 04/13/2020   HGB 12.1 04/13/2020   HCT 36.5 04/13/2020    MCV 109.6 (H) 04/13/2020   PLT 168 04/13/2020      Chemistry      Component Value Date/Time   NA 139 02/17/2020 0937   K 4.3 02/17/2020 0937   CL 106 02/17/2020 0937   CO2 24 02/17/2020 0937   BUN 14 02/17/2020 0937   CREATININE 1.01 (H) 02/17/2020 0937   CREATININE 0.84 04/29/2019 0915   CREATININE 0.86 06/05/2014 0957      Component Value Date/Time   CALCIUM 9.7 02/17/2020 0937   ALKPHOS 60 02/17/2020 0937   AST 21 02/17/2020 0937   AST 22 04/29/2019 0915   ALT 19 02/17/2020 0937   ALT 18 04/29/2019 0915   BILITOT 0.5 02/17/2020 0937   BILITOT 0.6 04/29/2019 0915

## 2020-04-21 MED FILL — LYNPARZA 100 MG TAB: 100 | 30 days supply | Qty: 120 | Fill #2

## 2020-04-30 ENCOUNTER — Inpatient Hospital Stay: Payer: Medicare HMO | Attending: Obstetrics | Admitting: Gynecologic Oncology

## 2020-04-30 ENCOUNTER — Other Ambulatory Visit: Payer: Self-pay

## 2020-04-30 ENCOUNTER — Encounter: Payer: Self-pay | Admitting: Gynecologic Oncology

## 2020-04-30 VITALS — BP 134/59 | HR 87 | Temp 97.5°F | Resp 18 | Ht 65.0 in | Wt 159.0 lb

## 2020-04-30 DIAGNOSIS — C561 Malignant neoplasm of right ovary: Secondary | ICD-10-CM | POA: Insufficient documentation

## 2020-04-30 DIAGNOSIS — E119 Type 2 diabetes mellitus without complications: Secondary | ICD-10-CM | POA: Insufficient documentation

## 2020-04-30 DIAGNOSIS — Z90710 Acquired absence of both cervix and uterus: Secondary | ICD-10-CM | POA: Diagnosis not present

## 2020-04-30 DIAGNOSIS — Z148 Genetic carrier of other disease: Secondary | ICD-10-CM | POA: Diagnosis not present

## 2020-04-30 DIAGNOSIS — C786 Secondary malignant neoplasm of retroperitoneum and peritoneum: Secondary | ICD-10-CM

## 2020-04-30 DIAGNOSIS — M8580 Other specified disorders of bone density and structure, unspecified site: Secondary | ICD-10-CM | POA: Insufficient documentation

## 2020-04-30 DIAGNOSIS — Z90722 Acquired absence of ovaries, bilateral: Secondary | ICD-10-CM | POA: Insufficient documentation

## 2020-04-30 DIAGNOSIS — Z79899 Other long term (current) drug therapy: Secondary | ICD-10-CM | POA: Diagnosis not present

## 2020-04-30 DIAGNOSIS — I10 Essential (primary) hypertension: Secondary | ICD-10-CM | POA: Insufficient documentation

## 2020-04-30 DIAGNOSIS — Z7984 Long term (current) use of oral hypoglycemic drugs: Secondary | ICD-10-CM | POA: Insufficient documentation

## 2020-04-30 NOTE — Progress Notes (Signed)
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 The left adnexa measured 12.7 x 11.6 x 8.1 with low level echoes and soft tissue nodules    07/30/2017 Tumor Marker   Patient's tumor was tested for the following markers: CA-125 Results of the tumor marker test revealed 521.3   08/17/2017 Pathology Results   The malignant cells are positive for PAX-8, cytokeratin 7, estrogen receptor, and faintly positive for GATA-3. They are negative for p53, GCDFP, and cytokeratin 20. The finding are consistent with a gynecologic primary carcinoma. Additional studies can be performed upon clinician request.   08/17/2017 Procedure   Technically successful CT-guided left lower quadrant omental mass core biopsy.   08/20/2017 PET scan   1. Cystic masses arising from the pelvis. The nodular component of the RIGHT cystic mass is intensely hypermetabolic consistent with malignant ovarian neoplasm. 2. Extensive hypermetabolic peritoneal thickening in the lower abdomen and upper pelvis, upper abdomen, and upper abdominal precordial fat and paradiaphragmatic fat. 3. Retroperitoneal nodal metastasis adjacent to the IVC at the level the kidneys. 4. No evidence of metastatic disease in the thorax other small effusion on the LEFT and nodal metastasis in the fat superior to the diaphragm. 5. Mild metabolic activity associated the distal esophagus is favored benign esophagitis.   08/20/2017 Imaging   CT chest 1. Bilateral cardiophrenic angle nodal metastasis. No  additional findings to suggest metastatic disease to the chest. 2. Small left pleural effusion. 3. Peritoneal carcinomatosis noted within the abdomen. 4. Hepatic steatosis.    08/24/2017 Cancer Staging   Staging form: Ovary, Fallopian Tube, and Primary Peritoneal Carcinoma, AJCC 8th Edition - Clinical: Stage IV (cT3, cN1, cM1) - Signed by [name], MD on 08/24/2017   08/31/2017 Tumor Marker   Patient's tumor was tested for the following markers: CA-125 Results of the tumor marker test revealed 819.9   09/26/2017 Adverse Reaction   She developed reaction to Taxol, managed successfully with additional premedications   10/17/2017 Tumor Marker   Patient's tumor was tested for the following markers: CA-125 Results of the tumor marker test revealed 374.4   10/31/2017 Imaging   1. No significant change in size of the large complex bilateral adnexal masses consistent with the known history of ovarian cancer. There is increased dependent density within the left-sided lesion. 2. Stable peritoneal carcinomatosis.  No significant ascites.  3. The bilateral pericardiac adenopathy has improved. No progressive thoracic metastatic disease. No pulmonary or osseous metastatic disease. 4. Suspicion of nonocclusive thrombus in the right internal jugular vein related to the right IJ port. Recommend further evaluation with Doppler ultrasound.   11/13/2017 Surgery   Preoperative Diagnosis: Ovarian cancer s/p neoadjuvant chemotherapy   Procedure(s) Performed:   1. Exploratory laparotomy  2. Bilateral salpingo-oophorectomy with radical tumor debulking for ovarian cancer   including retroperitoneal dissection, lysis of adhesions (enterolysis of small bowel in deep pelvis, left adnexal adhesions to sigmoid and omentum to LLQ) ~1 hour, ureterolysis. 3. Omentectomy  4. Takedown of splenic flexure with removal of omental tumor in left upper quadrant  Specimens: Bilateral  tubes and ovaries, omentum, splenic  flexure nodule, right paracolic gutter nodule, cecal nodule, right ureteral peritoneal nodule, small bowel mesentary.  Indication for Procedure:  Patient is s/p 3 cycles of chemotherapy with response based on CA125 and decreased mediastinal disease (to <1cm).  Operative Findings:  This represented an optimal cytoreduction with gross residual disease remaining in the deep pelvis near the levator floor and possibly in the region of the right IP ligament/periappendicial region.  Upon entry a large ~20cm right tube/ovary, mostly cystic. Adherent rind along ileocecal region and appendix. Large cystic left tube/ovary ~10cm with sigmoid colon stretched across the mass and extension of the cystic lesion into the deep pelvis with residual palpable disease deep pelvis near levators. Estimate ~1cm or less on palpation, not visible disease. Omental caking in the LLQ adherent to pelvic sidewall. Omental disease noted RUQ separate. In addition ~1.5cm lesion in splenic flexure omentum. Evidence of prior diaphragmatic disease, no visible disease.      11/13/2017 Pathology Results   1. 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 - TUMOR INVOLVES THE OVARIAN SURFACE. - RIGHT FALLOPIAN TUBE IS INVOLVED. - SEE ONCOLOGY TABLE. - SEE NOTE. 2. Soft tissue mass, biopsy, cecal gutter nodule - METASTATIC MUCINOUS ADENOCARCINOMA. 3. Ovary and fallopian tube, left, left ovary and fallopian tube - METASTATIC MUCINOUS ADENOCARCINOMA TO LEFT OVARY AND FALLOPIAN TUBE. 4. Omentum, resection for tumor - METASTATIC MUCINOUS ADENOCARCINOMA TO OMENTUM. 5. Soft tissue mass, biopsy, splenic flexure nodule - METASTATIC MUCINOUS ADENOCARCINOMA. 6. Soft tissue mass, biopsy, cecal implant - METASTATIC MUCINOUS ADENOCARCINOMA. 7. Soft tissue mass, biopsy, right ureteral peritoneal implant - METASTATIC MUCINOUS ADENOCARCINOMA. 8. Mesentery, small bowel nodule - METASTATIC  MUCINOUS ADENOCARCINOMA. Microscopic Comment 1. OVARY or FALLOPIAN TUBE or PRIMARY PERITONEUM: Procedure: Bilateral salpingo-oophorectomy. Specimen Integrity: Intact. Tumor Site: Right ovary. Ovarian Surface Involvement: Present. Fallopian Tube Surface Involvement: Present. Tumor Size: 20 cm. Histologic Type: Mucinous adenocarcinoma. Histologic Grade: Overall G2 (moderately differentiated) (focal areas of poor differentiation are present). Implants: Present. Other Tissue/ Organ Involvement: Left ovary, left fallopian tube and omentum. Largest Extrapelvic Peritoneal Focus: less than 2 cm. See note Peritoneal/Ascitic Fluid: N/A Treatment Effect: No definite or minimal response identified (chemotherapy response score 1 [CRS 1]) Regional Lymph Nodes No lymph nodes submitted or found Number of Lymph Nodes Examined: 0 Pathologic Stage Classification (pTNM, AJCC 8th Edition): ypT3b, ypNX. Representative Tumor Block: 1D and 1E. (NDK:gt, 11/15/17)   11/19/2017 Genetic Testing   Negative genetic testing on the [employer] Myrisk panel.  The [facility] gene panel offered by [employer] includes sequencing and deletion/duplication testing of the following 35 genes: APC, ATM, AXIN2, BARD1, BMPR1A, BRCA1, BRCA2, BRIP1, CHD1, CDK4, CDKN2A, CHEK2, EPCAM (large rearrangement only), HOXB13, (sequencing only), GALNT12, MLH1, MSH2, MSH3 (excluding repetitive portions of exon 1), MSH6, MUTYH, NBN, NTHL1, PALB2, PMS2, PTEN, RAD51C, RAD51D, RNF43, RPS20, SMAD4, STK11, and TP53. Sequencing was performed for select regions of POLE and POLD1, and large rearrangement analysis was performed for select regions of GREM1. The report date is November 19, 2017.  HRD tumor results indicate a BRCA1 mutation identified in the ovarian tumor causing genomic instability. The report date is 12/04/2017     Genetic Testing   Patient has genetic testing done for ER/PR and Her2/neu. Results revealed patient has the  following: ER 15-20% PR 5-10% Her2/neu - negative    Genetic Testing   Patient has genetic testing done for MSI/MMR. Results revealed patient has the following mutation(s):  MMR normal, MSI stable   12/10/2017 Imaging   1. Interval resection of large bilateral adnexal cystic masses. Residual soft tissue/tumor within bilateral adnexal regions as above. 2. Interval resolution of previously noted omental cake which may reflect interval omentectomy. There is a new peritoneal deposit identified within the left upper quadrant of the abdomen involving the anterior surface of the spleen.   12/10/2017 Tumor Marker   Patient's tumor was tested for the following markers: CA-125 Results of the tumor marker test revealed 94.9   12/11/2017 - 02/20/2018 Chemotherapy   The patient had FOLFOX regimen for mucinous   01/09/2018 Tumor Marker   Patient's tumor was tested for the following markers: CA-125 Results of the tumor marker test revealed 37   01/31/2018 Imaging   Resolution of peritoneal soft tissue density along the anterior margin of the spleen since prior study. No evidence of residual or progressive metastatic disease.  Mild sigmoid diverticulosis, without radiographic evidence of diverticulitis. Resolution of small pericolonic abscess along the left lateral pelvic sidewall.  Mild hepatic steatosis.   02/06/2018 Tumor Marker   Patient's tumor was tested for the following markers: CA-125 Results of the tumor marker test revealed 36.7   03/06/2018 -  Chemotherapy   The patient is started on [name] as PARP maintenance   03/13/2018 Tumor Marker   Patient's tumor was tested for the following markers: CA-125 Results of the tumor marker test revealed 33.8   04/05/2018 Tumor Marker   Patient's tumor was tested for the following markers: CA-125 Results of the tumor marker test revealed 27.3   05/03/2018 Tumor Marker   Patient's tumor was tested for the following markers: CA-125 Results of  the tumor marker test revealed 23.5   07/08/2018 Tumor Marker   Patient's tumor was tested for the following markers: CA-125 Results of the tumor marker test revealed 19.9   09/02/2018 Tumor Marker   Patient's tumor was tested for the following markers: CA-125 Results of the tumor marker test revealed 17   10/28/2018 Imaging   1. 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 Repeat abdominal MRI with and without IV gadolinium with MRCP is recommended in 2 years to ensure continued stability. This recommendation follows ACR consensus guidelines: Management of Incidental Pancreatic Cysts: A White Paper of the ACR Incidental Findings Committee. [place] [id number];32:[id number]. 2. Hepatic steatosis. 3. Additional incidental findings, as above.     10/28/2018 Tumor Marker   Patient's tumor was tested for the following markers: CA-125 Results of the tumor marker test revealed 16.9   03/03/2019 Tumor Marker   Patient's tumor was tested for the following markers: CA-125 Results of the tumor marker test revealed 12.7.   07/09/2019 Tumor Marker   Patient's tumor was tested for the following markers: CA-125 Results of the tumor marker test revealed 9.5.   09/02/2019 Tumor Marker   Patient's tumor was tested for the following markers: CA-125 Results of the tumor marker test revealed 11.   10/28/2019 Tumor Marker   Patient's tumor was tested for the following markers: CA-125 Results of the tumor marker test revealed 10.2   12/23/2019 Tumor Marker   Patient's tumor was tested for the following markers: CA-125 Results of the tumor marker test revealed 12.1   02/17/2020 Tumor Marker   Patient's tumor was tested for the following markers: CA-125 Results of the tumor marker test revealed 9.5  Interval History: Patient is followed at the cancer center for a history  of advanced stage ovarian cancer, details of which are above.  She presents today for follow-up.  She has not had a pelvic exam in quite some time, mostly due to Covid.  She follows regularly with her medical oncologist, Dr. Alvy Bimler.  She reports overall doing well.  She continues on [place] and has no significant side effects.  She denies any changes to bladder or bowel function.  She endorses a good appetite without nausea or vomiting.  She denies any significant changes in weight recently.  She has ongoing significant family stressors.  Her mother was recently hospitalized in December secondary to a UTI with associated nausea and emesis.  She is now back in her assisted living facility, [facility].  Her son was recently diagnosed with Huntington's disease and is living with her.  Past Medical/Surgical History: Past Medical History:  Diagnosis Date  . Diabetes mellitus without complication ([place]) 6384   TYPE 2  . Diverticulosis   . Family history of cancer   . Hypertension   . Osteopenia   . Ovarian cancer ([place])   . Pancreatic cyst     Past Surgical History:  Procedure Laterality Date  . CHOLECYSTECTOMY N/A 07/06/2014   Procedure: LAPAROSCOPIC CHOLECYSTECTOMY;  Surgeon: Aviva Signs Md, MD;  Location: [facility];  Service: General;  Laterality: N/A;  . COLONOSCOPY N/A 06/26/2013   Procedure: COLONOSCOPY;  Surgeon: Rogene Houston, MD;  Location: [facility];  Service: Endoscopy;  Laterality: N/A;  1030  . DEBULKING N/A 11/13/2017   Procedure: DEBULKING;  Surgeon: Isabel Caprice, MD;  Location: [facility];  Service: Gynecology;  Laterality: N/A;  . IR FLUORO GUIDE PORT INSERTION RIGHT  09/03/2017  . IR US GUIDE VASC ACCESS RIGHT  09/03/2017  . LAPAROTOMY N/A 11/13/2017   Procedure: EXPLORATORY LAPAROTOMY;  Surgeon: Isabel Caprice, MD;  Location: [facility];  Service: Gynecology;  Laterality: N/A;  . SALPINGOOPHORECTOMY Bilateral 11/13/2017   Procedure: BILATERAL SALPINGO OOPHORECTOMY;  Surgeon:  Isabel Caprice, MD;  Location: [facility];  Service: Gynecology;  Laterality: Bilateral;  . VAGINAL HYSTERECTOMY  2011    Family History  Problem Relation Age of Onset  . Colon cancer Mother 68       colon ca  . Skin cancer Father 43       unsure type  . Heart attack Father 64       d. 85  . Stroke Maternal Grandmother   . Brain cancer Maternal Grandfather 75  . Dementia Maternal Aunt   . Diabetes Maternal Aunt   . Skin cancer Maternal Uncle   . Heart attack Paternal Aunt   . Fibromyalgia Sister   . Diabetes Sister   . Cancer Other 101       possible ovarian cancer, d. 102  . Prostate cancer Cousin        pat first cousin    Social History   Socioeconomic History  . Marital status: Married    Spouse name: Rush Landmark  . Number of children: 2  . Years of education: Not on file  . Highest education level: Not on file  Occupational History  . Occupation: retired Teacher, adult education  Tobacco Use  . Smoking status: Never Smoker  . Smokeless tobacco: Never Used  Vaping Use  . Vaping Use: Never used  Substance and Sexual Activity  . Alcohol use: Yes    Alcohol/week: 5.0 standard drinks    Types:  5 Standard drinks or equivalent per week    Comment: WINE occ, NONE SICNE DX IN 07-2017  . Drug use: No  . Sexual activity: Yes    Birth control/protection: None  Other Topics Concern  . Not on file  Social History Narrative  . Not on file   Social Determinants of Health   Financial Resource Strain: Not on file  Food Insecurity: Not on file  Transportation Needs: Not on file  Physical Activity: Not on file  Stress: Not on file  Social Connections: Not on file    Current Medications:  Current Outpatient Medications:  .  acetaminophen (TYLENOL) 500 MG tablet, Take 500 mg by mouth every 8 (eight) hours as needed for mild pain or moderate pain., Disp: , Rfl:  .  atenolol (TENORMIN) 25 MG tablet, Take 25 mg by mouth at bedtime. , Disp: , Rfl:  .  atorvastatin (LIPITOR) 20 MG  tablet, Take 20 mg by mouth daily., Disp: , Rfl:  .  CALCIUM-MAGNESIUM-VITAMIN D PO, Take 1 tablet by mouth daily., Disp: , Rfl:  .  lidocaine-prilocaine (EMLA) cream, Apply 1 application topically as needed., Disp: 30 g, Rfl: 6 .  losartan (COZAAR) 50 MG tablet, Take 50 mg by mouth at bedtime., Disp: , Rfl:  .  [name] 100 MG tablet, TAKE 2 TABLETS (200 MG TOTAL) BY MOUTH 2 (TWO) TIMES DAILY. SWALLOW WHOLE., Disp: 120 tablet, Rfl: 11 .  metFORMIN (GLUCOPHAGE) 1000 MG tablet, Take 500 mg by mouth 2 (two) times daily. , Disp: , Rfl: 0 .  Multiple Vitamin (MULTIVITAMIN WITH MINERALS) TABS tablet, Take 1 tablet by mouth daily., Disp: , Rfl:  .  famotidine (PEPCID) 20 MG tablet, Take 20 mg by mouth as needed for heartburn or indigestion. (Patient not taking: Reported on 04/29/2020), Disp: , Rfl:  .  loratadine (CLARITIN) 10 MG tablet, Take 10 mg by mouth daily as needed for allergies. (Patient not taking: Reported on 04/29/2020), Disp: , Rfl:   Review of Systems: Denies appetite changes, fevers, chills, fatigue, unexplained weight changes. Denies hearing loss, neck lumps or masses, mouth sores, ringing in ears or voice changes. Denies cough or wheezing.  Denies shortness of breath. Denies chest pain or palpitations. Denies leg swelling. Denies abdominal distention, pain, blood in stools, constipation, diarrhea, nausea, vomiting, or early satiety. Denies pain with intercourse, dysuria, frequency, hematuria or incontinence. Denies hot flashes, pelvic pain, vaginal bleeding or vaginal discharge.   Denies joint pain, back pain or muscle pain/cramps. Denies itching, rash, or wounds. Denies dizziness, headaches, numbness or seizures. Denies swollen lymph nodes or glands, denies easy bruising or bleeding. Denies anxiety, depression, confusion, or decreased concentration.  Physical Exam: BP (!) 134/59 (BP Location: Right Arm, Patient Position: Sitting)   Pulse 87   Temp (!) 97.5 F (36.4 C) (Tympanic)    Resp 18   Ht 5' 5"  (1.651 m)   Wt 159 lb (72.1 kg)   SpO2 99%   BMI 26.46 kg/m  General: Alert, oriented, no acute distress. HEENT: Normocephalic, atraumatic, sclera anicteric. Chest: Unlabored breathing on room air. Lungs clear to auscultation bilaterally, no wheezes or rhonchi. Cardiovascular: Regular rate and rhythm, no murmurs. Abdomen: soft, nontender.  Normoactive bowel sounds.  No masses or hepatosplenomegaly appreciated.  Well-healed incision. Extremities: Grossly normal range of motion.  Warm, well perfused.  No edema bilaterally. Skin: No rashes or lesions noted. Lymphatics: No cervical, supraclavicular, or inguinal adenopathy. GU: Normal appearing external genitalia without erythema, excoriation, or lesions.  Speculum exam  reveals moderately atrophic vaginal mucosa.  Cuff intact, no lesions or masses noted.  Bimanual exam reveals cuff intact, no nodularity.  Rectovaginal exam confirms findings.  Laboratory & Radiologic Studies: None new  Assessment & Plan: Sabrina Vang is a 70 y.o. woman with a history of advanced stage ovarian cancer, completed upfront treatment in 2019, now on [name] due to BRCA1 mutation.  The patient is NED on exam today and is tolerating her parp inhibitor without any side effects. She is scheduled for repeat labs in February and will see Dr. Alvy Bimler again in March. I recommend visits with me for a pelvic exam every 6-12 months, and can alternate visits with Dr. Alvy Bimler.  36 minutes of total time was spent for this patient encounter, including preparation, face-to-face counseling with the patient and coordination of care, and documentation of the encounter.  Jeral Pinch, MD  Division of Gynecologic Oncology  Department of Obstetrics and Gynecology  [facility]

## 2020-04-30 NOTE — Patient Instructions (Signed)
It was a pleasure meeting you today.  I recommend that we continue with visits every 6 to 12 months.  You are scheduled to see Dr. Alvy Bimler in March, and we will likely see her again in June.  At that visit, please talk to her about having her nurse get you scheduled with me for sometime in the fall.

## 2020-05-19 MED FILL — LYNPARZA 100 MG TAB: 100 | 30 days supply | Qty: 120 | Fill #3

## 2020-05-25 ENCOUNTER — Inpatient Hospital Stay: Payer: Medicare HMO

## 2020-05-25 ENCOUNTER — Other Ambulatory Visit: Payer: Self-pay

## 2020-05-25 ENCOUNTER — Inpatient Hospital Stay: Payer: Medicare HMO | Attending: Obstetrics

## 2020-05-25 DIAGNOSIS — Z452 Encounter for adjustment and management of vascular access device: Secondary | ICD-10-CM | POA: Diagnosis not present

## 2020-05-25 DIAGNOSIS — E119 Type 2 diabetes mellitus without complications: Secondary | ICD-10-CM | POA: Diagnosis not present

## 2020-05-25 DIAGNOSIS — C561 Malignant neoplasm of right ovary: Secondary | ICD-10-CM

## 2020-05-25 LAB — CBC WITH DIFFERENTIAL/PLATELET
Abs Immature Granulocytes: 0.01 10*3/uL (ref 0.00–0.07)
Basophils Absolute: 0 10*3/uL (ref 0.0–0.1)
Basophils Relative: 0 %
Eosinophils Absolute: 0.1 10*3/uL (ref 0.0–0.5)
Eosinophils Relative: 2 %
HCT: 36.8 % (ref 36.0–46.0)
Hemoglobin: 12.1 g/dL (ref 12.0–15.0)
Immature Granulocytes: 0 %
Lymphocytes Relative: 29 %
Lymphs Abs: 1.8 10*3/uL (ref 0.7–4.0)
MCH: 36.2 pg — ABNORMAL HIGH (ref 26.0–34.0)
MCHC: 32.9 g/dL (ref 30.0–36.0)
MCV: 110.2 fL — ABNORMAL HIGH (ref 80.0–100.0)
Monocytes Absolute: 0.3 10*3/uL (ref 0.1–1.0)
Monocytes Relative: 5 %
Neutro Abs: 4 10*3/uL (ref 1.7–7.7)
Neutrophils Relative %: 64 %
Platelets: 162 10*3/uL (ref 150–400)
RBC: 3.34 MIL/uL — ABNORMAL LOW (ref 3.87–5.11)
RDW: 14.5 % (ref 11.5–15.5)
WBC: 6.3 10*3/uL (ref 4.0–10.5)
nRBC: 0 % (ref 0.0–0.2)

## 2020-05-25 LAB — COMPREHENSIVE METABOLIC PANEL
ALT: 18 U/L (ref 0–44)
AST: 22 U/L (ref 15–41)
Albumin: 3.9 g/dL (ref 3.5–5.0)
Alkaline Phosphatase: 56 U/L (ref 38–126)
Anion gap: 7 (ref 5–15)
BUN: 10 mg/dL (ref 8–23)
CO2: 25 mmol/L (ref 22–32)
Calcium: 9.2 mg/dL (ref 8.9–10.3)
Chloride: 108 mmol/L (ref 98–111)
Creatinine, Ser: 1.01 mg/dL — ABNORMAL HIGH (ref 0.44–1.00)
GFR, Estimated: >60 mL/min (ref >60)
Glucose, Bld: 152 mg/dL — ABNORMAL HIGH (ref 70–99)
Potassium: 4.2 mmol/L (ref 3.5–5.1)
Sodium: 140 mmol/L (ref 135–145)
Total Bilirubin: 0.5 mg/dL (ref 0.3–1.2)
Total Protein: 7.1 g/dL (ref 6.5–8.1)

## 2020-05-25 MED ORDER — HEPARIN SOD (PORK) LOCK FLUSH 100 UNIT/ML IV SOLN
500.0000 [IU] | Freq: Once | INTRAVENOUS | Status: AC
  Administered 2020-05-25: 500 [IU]
  Filled 2020-05-25: qty 5

## 2020-05-25 MED ORDER — SODIUM CHLORIDE 0.9% FLUSH
10.0000 mL | Freq: Once | INTRAVENOUS | Status: AC
  Administered 2020-05-25: 10 mL
  Filled 2020-05-25: qty 10

## 2020-05-26 ENCOUNTER — Telehealth: Payer: Self-pay

## 2020-05-26 LAB — CA 125: Cancer Antigen (CA) 125: 9.6 U/mL (ref 0.0–38.1)

## 2020-05-26 NOTE — Telephone Encounter (Signed)
-----   Message from [name], MD sent at 05/26/2020  7:49 AM EST ----- Regarding: pls let her know labs and CA-125 is ok

## 2020-05-26 NOTE — Telephone Encounter (Signed)
Called and given below message. She verbalized understanding. 

## 2020-06-06 IMAGING — CT CT CHEST W/ CM
2 of 5 series · 12 of 36 positions shown, 15 images · IV contrast (ISOVUE 300)
Comparison: Abdominopelvic MRI 08/02/2017, PET-CT 08/21/1998 chest
CT 08/20/2017.

CLINICAL DATA: Ovarian cancer diagnosed in July 2017. Chemotherapy
in progress. No current complaints.

EXAM:
CT CHEST, ABDOMEN, AND PELVIS WITH CONTRAST
TECHNIQUE: Multidetector CT imaging of the chest, abdomen and pelvis was
performed following the standard protocol during bolus
administration of intravenous contrast.
CONTRAST:  100mL UCR3KN-VVV IOPAMIDOL (UCR3KN-VVV) INJECTION 61%

[Series 2: cap with · axial · 0.70mm/px · z∈[-562,-66]mm · 9 of 125 slices shown, 12 images]
[im 13/125  mediastinal]
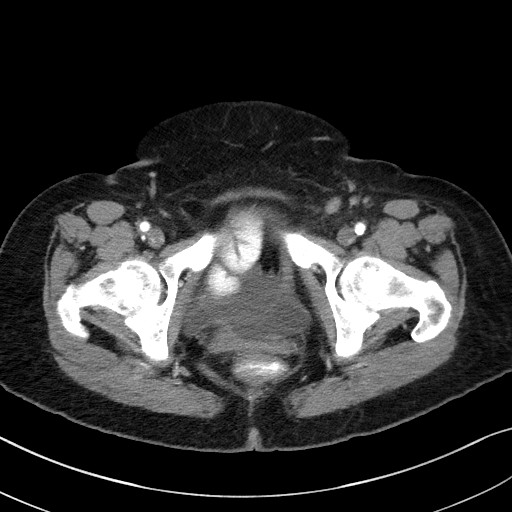
[im 13/125  lung]
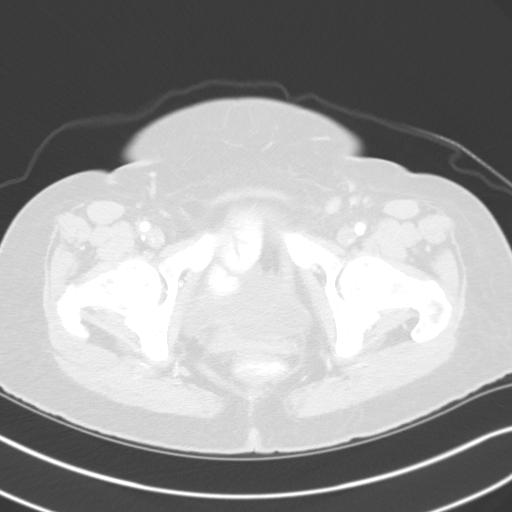
[im 25/125  lung]
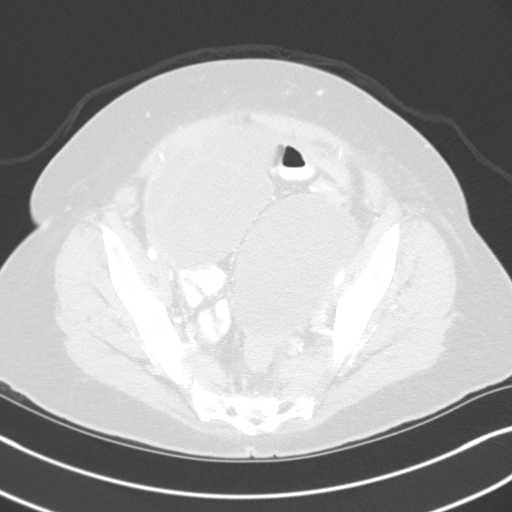
[im 38/125  lung]
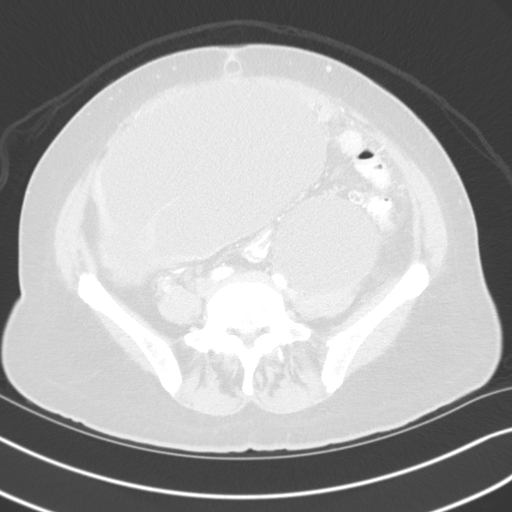
[im 50/125  lung]
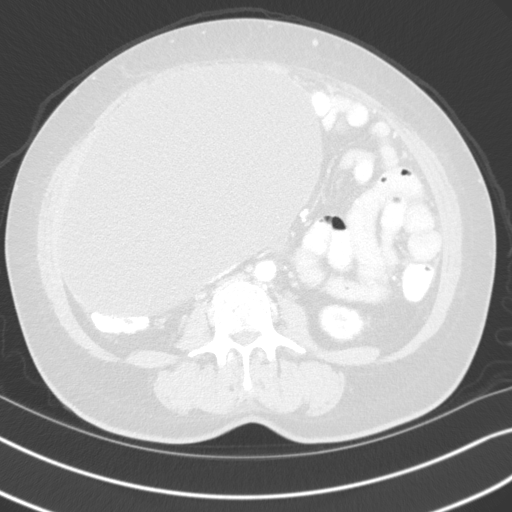
[im 63/125  mediastinal]
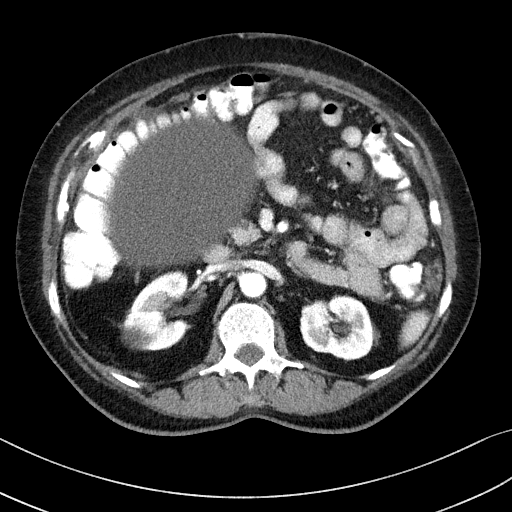
[im 63/125  lung]
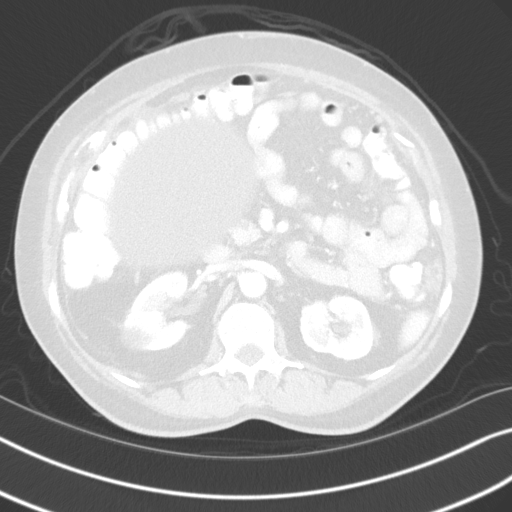
[im 75/125  lung]
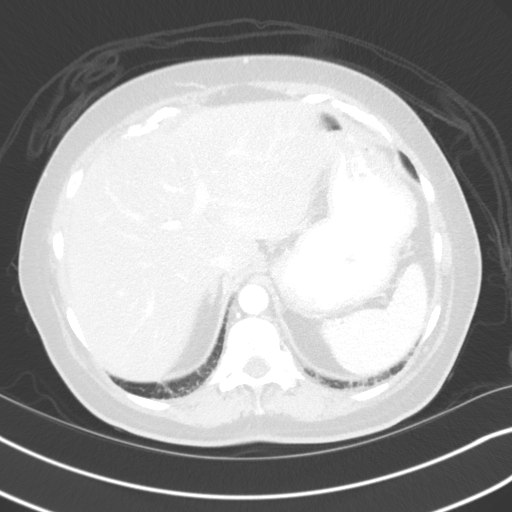
[im 87/125  lung]
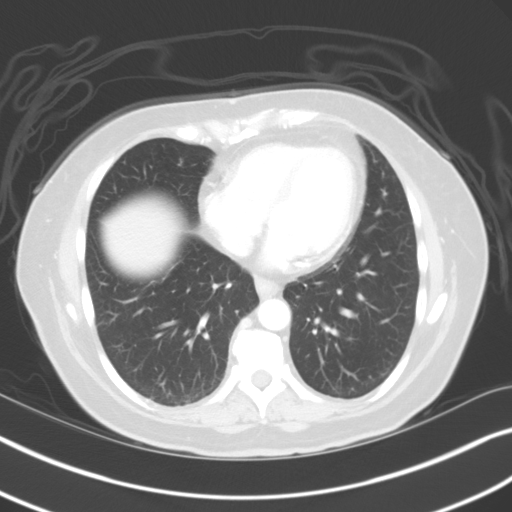
[im 100/125  lung]
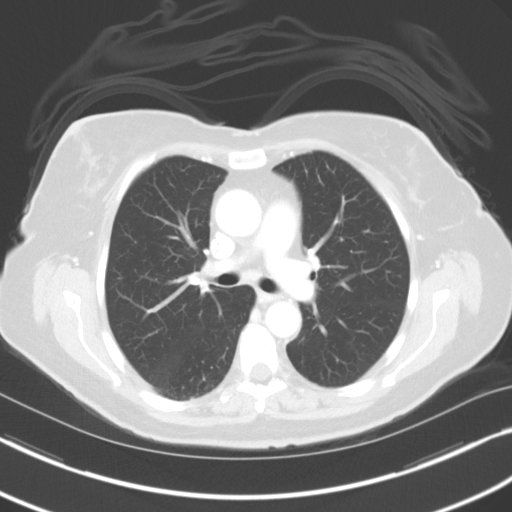
[im 112/125  mediastinal]
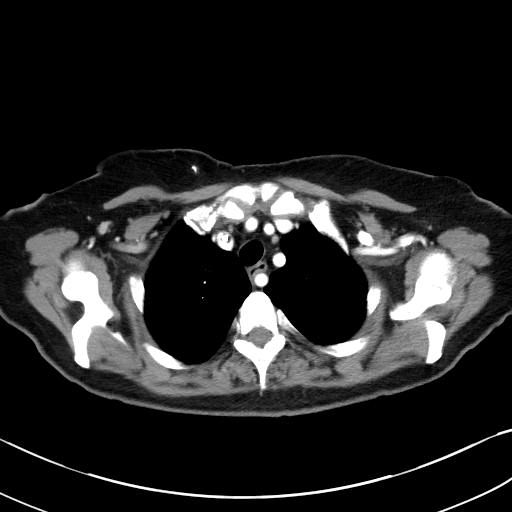
[im 112/125  lung]
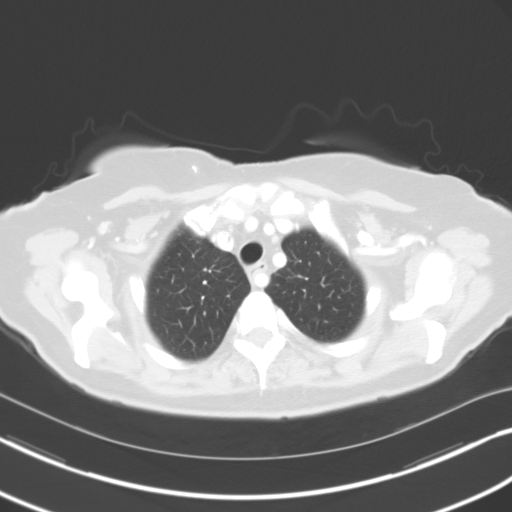

[Series 5: coronals · coronal · 0.82mm/px · 3 of 152 slices shown]
[im 31/152  lung]
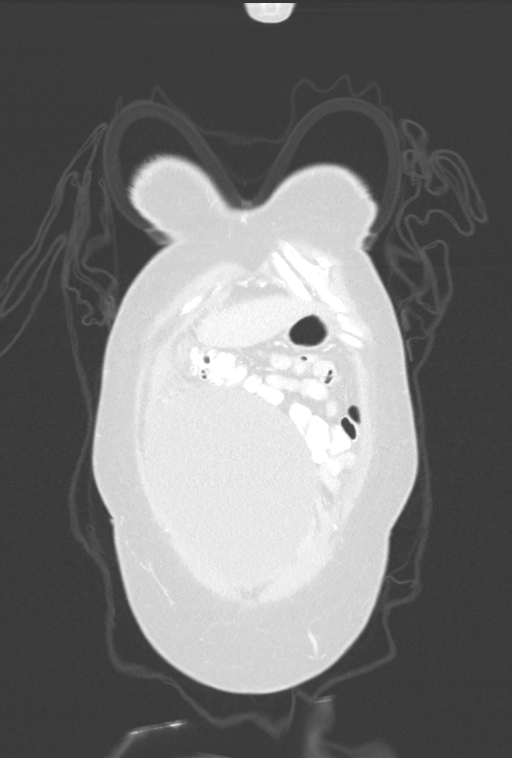
[im 61/152  lung]
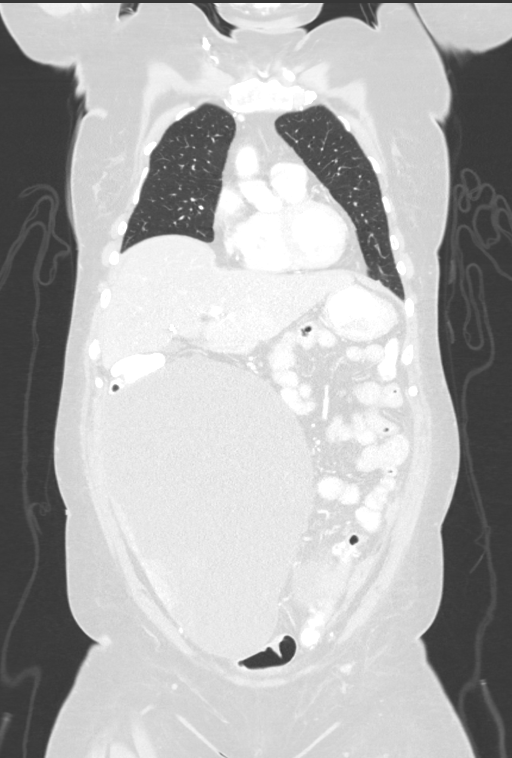
[im 91/152  lung]
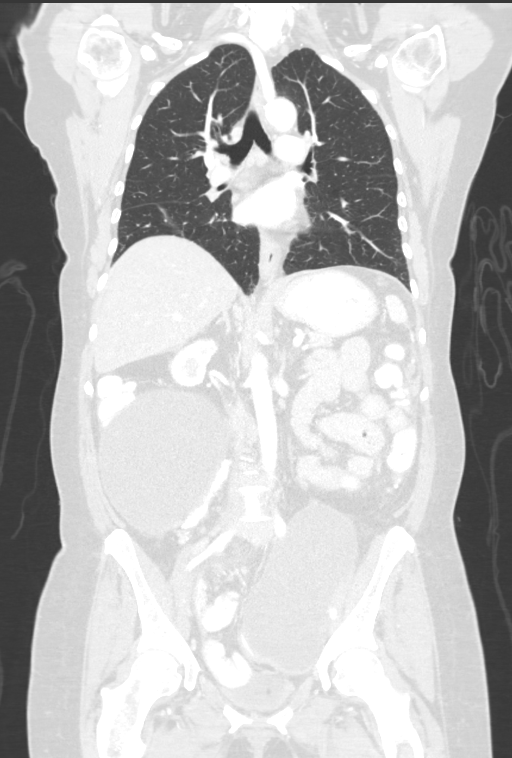

[12 of 36 positions shown; findings below may reference images not displayed]

FINDINGS: CT CHEST FINDINGS

Cardiovascular: Interval placement of a right IJ Port-A-Cath extends
to the lower SVC. There is a tubular filling defect extending
superiorly into the right internal jugular vein (axial images 2
through [DATE]), not typical for inflow artifact. This could reflect
nonocclusive thrombus. The left internal jugular vein and SVC appear
normal. Aberrant right subclavian artery noted. The heart size is
normal. There is no pericardial effusion.

Mediastinum/Nodes: The previously demonstrated bilateral paracardiac
nodes have improved. Node at the right cardiophrenic angle measures
8 mm short axis on image 43/2 (previously 19 mm). Left pericardiac
node measures 9 mm on image 48/2 (previously 13 mm). Additional
small mediastinal and axillary lymph nodes bilaterally are stable.
There is mild distal esophageal wall thickening. The trachea appears
unremarkable.

Lungs/Pleura: There is no pleural effusion. Mild dependent
atelectasis at both lung bases. No suspicious pulmonary nodules.

Musculoskeletal/Chest wall: No chest wall mass or suspicious osseous
findings.

CT ABDOMEN AND PELVIS FINDINGS

Hepatobiliary: Hepatic steatosis as before. No focal hepatic lesion
or abnormal enhancement. No significant biliary dilatation status
post cholecystectomy.

Pancreas: Unremarkable. No pancreatic ductal dilatation or
surrounding inflammatory changes.

Spleen: Normal in size without focal abnormality.

Adrenals/Urinary Tract: Both adrenal glands appear normal. There is
a stable cyst in the interpolar region of the right kidney. No
evidence of renal mass, urinary tract calculus or hydronephrosis. No
significant bladder findings.

Stomach/Bowel: No evidence of bowel wall thickening, distention or
surrounding inflammatory change. There is peripheral displacement of
the bowel by the large bilateral adnexal lesions.

Vascular/Lymphatic: There are no enlarged abdominal or pelvic lymph
nodes. There are stable small inguinal lymph nodes bilaterally mild
aortic and branch vessel atherosclerosis. There is flattening of the
IVC without evidence of thrombosis.

Reproductive: Previous hysterectomy. Large complex bilateral adnexal
masses are again noted. The right adnexal lesion measures up to
x 13.6 cm transverse (image 77/2) and extends 24.7 cm cephalocaudad.
The irregular solid components along the inferolateral aspect of
this lesion have not significantly changed. Left adnexal lesion
measures 13.0 x 7.3 x 13.6 cm and demonstrates increased density
within a dependent component which measures 2.2 x 2.5 cm transverse
on image 102/2.

Other: Omental nodularity is similar to the previous studies,
hypermetabolic on PET-CT. Largest component measures up to 5.3 x
cm on image 99/2. No significant associated ascites.

Musculoskeletal: No acute or significant osseous findings.
IMPRESSION: 1. No significant change in size of the large complex bilateral
adnexal masses consistent with the known history of ovarian cancer.
There is increased dependent density within the left-sided lesion.
2. Stable peritoneal carcinomatosis.  No significant ascites.
3. The bilateral pericardiac adenopathy has improved. No progressive
thoracic metastatic disease. No pulmonary or osseous metastatic
disease.
4. Suspicion of nonocclusive thrombus in the right internal jugular
vein related to the right IJ port. Recommend further evaluation with
Doppler ultrasound.
5. These results will be called to the ordering clinician or
representative by the Radiologist Assistant, and communication
documented in the PACS or zVision Dashboard.

## 2020-06-16 MED FILL — LYNPARZA 100 MG TAB: 100 | 30 days supply | Qty: 120 | Fill #4

## 2020-07-13 ENCOUNTER — Inpatient Hospital Stay: Payer: Medicare HMO | Admitting: Hematology and Oncology

## 2020-07-13 ENCOUNTER — Other Ambulatory Visit: Payer: Self-pay

## 2020-07-13 ENCOUNTER — Encounter: Payer: Self-pay | Admitting: Hematology and Oncology

## 2020-07-13 ENCOUNTER — Inpatient Hospital Stay: Payer: Medicare HMO

## 2020-07-13 ENCOUNTER — Inpatient Hospital Stay: Payer: Medicare HMO | Attending: Obstetrics

## 2020-07-13 VITALS — BP 129/66 | HR 66 | Temp 97.9°F | Resp 18 | Ht 65.0 in | Wt 154.8 lb

## 2020-07-13 DIAGNOSIS — D61818 Other pancytopenia: Secondary | ICD-10-CM

## 2020-07-13 DIAGNOSIS — Z452 Encounter for adjustment and management of vascular access device: Secondary | ICD-10-CM | POA: Insufficient documentation

## 2020-07-13 DIAGNOSIS — C561 Malignant neoplasm of right ovary: Secondary | ICD-10-CM

## 2020-07-13 DIAGNOSIS — Z8543 Personal history of malignant neoplasm of ovary: Secondary | ICD-10-CM | POA: Insufficient documentation

## 2020-07-13 DIAGNOSIS — K862 Cyst of pancreas: Secondary | ICD-10-CM

## 2020-07-13 LAB — COMPREHENSIVE METABOLIC PANEL
ALT: 17 U/L (ref 0–44)
AST: 21 U/L (ref 15–41)
Albumin: 3.9 g/dL (ref 3.5–5.0)
Alkaline Phosphatase: 58 U/L (ref 38–126)
Anion gap: 10 (ref 5–15)
BUN: 14 mg/dL (ref 8–23)
CO2: 24 mmol/L (ref 22–32)
Calcium: 9 mg/dL (ref 8.9–10.3)
Chloride: 106 mmol/L (ref 98–111)
Creatinine, Ser: 1.14 mg/dL — ABNORMAL HIGH (ref 0.44–1.00)
GFR, Estimated: 52 mL/min — ABNORMAL LOW (ref >60)
Glucose, Bld: 165 mg/dL — ABNORMAL HIGH (ref 70–99)
Potassium: 4.1 mmol/L (ref 3.5–5.1)
Sodium: 140 mmol/L (ref 135–145)
Total Bilirubin: 0.5 mg/dL (ref 0.3–1.2)
Total Protein: 7.2 g/dL (ref 6.5–8.1)

## 2020-07-13 LAB — CBC WITH DIFFERENTIAL/PLATELET
Abs Immature Granulocytes: 0.02 10*3/uL (ref 0.00–0.07)
Basophils Absolute: 0.1 10*3/uL (ref 0.0–0.1)
Basophils Relative: 1 %
Eosinophils Absolute: 0.1 10*3/uL (ref 0.0–0.5)
Eosinophils Relative: 2 %
HCT: 35.2 % — ABNORMAL LOW (ref 36.0–46.0)
Hemoglobin: 11.8 g/dL — ABNORMAL LOW (ref 12.0–15.0)
Immature Granulocytes: 0 %
Lymphocytes Relative: 27 %
Lymphs Abs: 1.8 10*3/uL (ref 0.7–4.0)
MCH: 36.4 pg — ABNORMAL HIGH (ref 26.0–34.0)
MCHC: 33.5 g/dL (ref 30.0–36.0)
MCV: 108.6 fL — ABNORMAL HIGH (ref 80.0–100.0)
Monocytes Absolute: 0.4 10*3/uL (ref 0.1–1.0)
Monocytes Relative: 7 %
Neutro Abs: 4.2 10*3/uL (ref 1.7–7.7)
Neutrophils Relative %: 63 %
Platelets: 181 10*3/uL (ref 150–400)
RBC: 3.24 MIL/uL — ABNORMAL LOW (ref 3.87–5.11)
RDW: 14.6 % (ref 11.5–15.5)
WBC: 6.7 10*3/uL (ref 4.0–10.5)
nRBC: 0 % (ref 0.0–0.2)

## 2020-07-13 NOTE — Assessment & Plan Note (Signed)
She has mild pancytopenia from treatment but not symptomatic Observe closely for

## 2020-07-13 NOTE — Assessment & Plan Note (Signed)
Overall, she tolerated treatment very well without side effects Her tumor marker is stable We discussed the role of treatment will be for at least 2 years; we discussed the risk and benefits of continuing treatment versus stopping for now, she is comfortable to continue on treatment I will see her again in 4 months for further follow-up and reviewed the plan of care she is educated to watch for signs and symptoms of cancer recurrence for now, I do not plan routine surveillance imaging study Recommend GYN oncologist to see her once a year

## 2020-07-13 NOTE — Progress Notes (Signed)
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[place]) Overall, she tolerated treatment very well without side effects Her tumor marker is stable We discussed the role of treatment will be for at least 2 years; we discussed the risk and benefits of continuing treatment versus stopping for now, she is comfortable to continue on treatment I will see her again in 4 months for further follow-up and reviewed the plan of care she is educated to watch for signs and symptoms of cancer recurrence for now, I do not plan routine surveillance imaging study Recommend GYN oncologist to see her once a year  Pancytopenia, acquired [facility]) She has mild pancytopenia from treatment but not symptomatic Observe closely for  Pancreatic cyst Her last MRI of the abdomen in July 2020 was stable She would need another repeat imaging in July 2022   Orders Placed This Encounter  Procedures  . MR Abdomen W Wo Contrast    Standing Status:   Future    Standing Expiration Date:   07/13/2021    Order Specific Question:   If indicated for the ordered procedure, I authorize the administration of contrast media per Radiology protocol    Answer:   Yes    Order Specific Question:   What is the patient's sedation requirement?    Answer:   No Sedation    Order Specific Question:   Does the patient have a pacemaker or implanted devices?    Answer:   No    Order Specific Question:   Preferred imaging location?    Answer:   [facility] (table limit - 550 lbs)    All questions were answered. The patient knows to call the clinic with any problems, questions or concerns. The total time spent in the appointment was 20 minutes encounter with patients including review of chart and various tests results, discussions about plan of care and coordination of care plan   [name], MD 07/13/2020  11:51 AM  INTERVAL HISTORY: Please see below for problem oriented charting. 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 The left adnexa measured 12.7 x 11.6 x 8.1 with low level echoes and soft tissue nodules    07/30/2017 Tumor Marker   Patient's tumor was tested for the following markers: CA-125 Results of the tumor marker test revealed 521.3   08/17/2017 Pathology Results   The malignant cells are positive for PAX-8, cytokeratin 7, estrogen receptor, and faintly positive for GATA-3. They are negative for p53, GCDFP, and cytokeratin 20. The finding are consistent with a gynecologic primary carcinoma. Additional studies can be performed upon clinician request.   08/17/2017 Procedure   Technically successful CT-guided left lower quadrant omental mass core biopsy.   08/20/2017 PET scan   1. Cystic masses arising from the pelvis. The nodular component of the RIGHT  cystic mass is intensely hypermetabolic consistent with malignant ovarian neoplasm. 2. Extensive hypermetabolic peritoneal thickening in the lower abdomen and upper pelvis, upper abdomen, and upper abdominal precordial fat and paradiaphragmatic fat. 3. Retroperitoneal nodal metastasis adjacent to the IVC at the level the kidneys. 4. No evidence of metastatic disease in the thorax other small effusion on the LEFT and nodal metastasis in the fat superior to the  diaphragm. 5. Mild metabolic activity associated the distal esophagus is favored benign esophagitis.   08/20/2017 Imaging   CT chest 1. Bilateral cardiophrenic angle nodal metastasis. No additional findings to suggest metastatic disease to the chest. 2. Small left pleural effusion. 3. Peritoneal carcinomatosis noted within the abdomen. 4. Hepatic steatosis.    08/24/2017 Cancer Staging   Staging form: Ovary, Fallopian Tube, and Primary Peritoneal Carcinoma, AJCC 8th Edition - Clinical: Stage IV (cT3, cN1, cM1) - Signed by [name], MD on 08/24/2017   08/31/2017 Tumor Marker   Patient's tumor was tested for the following markers: CA-125 Results of the tumor marker test revealed 819.9   09/26/2017 Adverse Reaction   She developed reaction to Taxol, managed successfully with additional premedications   10/17/2017 Tumor Marker   Patient's tumor was tested for the following markers: CA-125 Results of the tumor marker test revealed 374.4   10/31/2017 Imaging   1. No significant change in size of the large complex bilateral adnexal masses consistent with the known history of ovarian cancer. There is increased dependent density within the left-sided lesion. 2. Stable peritoneal carcinomatosis.  No significant ascites.  3. The bilateral pericardiac adenopathy has improved. No progressive thoracic metastatic disease. No pulmonary or osseous metastatic disease. 4. Suspicion of nonocclusive thrombus in the right internal jugular vein related to the right IJ port. Recommend further evaluation with Doppler ultrasound.   11/13/2017 Surgery   Preoperative Diagnosis: Ovarian cancer s/p neoadjuvant chemotherapy   Procedure(s) Performed:   1. Exploratory laparotomy  2. Bilateral salpingo-oophorectomy with radical tumor debulking for ovarian cancer   including retroperitoneal dissection, lysis of adhesions (enterolysis of small bowel in deep pelvis, left adnexal adhesions to sigmoid and omentum to LLQ)  ~1 hour, ureterolysis. 3. Omentectomy  4. Takedown of splenic flexure with removal of omental tumor in left upper quadrant  Specimens: Bilateral tubes and ovaries, omentum, splenic flexure nodule, right paracolic gutter nodule, cecal nodule, right ureteral peritoneal nodule, small bowel mesentary.  Indication for Procedure:  Patient is s/p 3 cycles of chemotherapy with response based on CA125 and decreased mediastinal disease (to <1cm).  Operative Findings:  This represented an optimal cytoreduction with gross residual disease remaining in the deep pelvis near the levator floor and possibly in the region of the right IP ligament/periappendicial region.  Upon entry a large ~20cm right tube/ovary, mostly cystic. Adherent rind along ileocecal region and appendix. Large cystic left tube/ovary ~10cm with sigmoid colon stretched across the mass and extension of the cystic lesion into the deep pelvis with residual palpable disease deep pelvis near levators. Estimate ~1cm or less on palpation, not visible disease. Omental caking in the LLQ adherent to pelvic sidewall. Omental disease noted RUQ separate. In addition ~1.5cm lesion in splenic flexure omentum. Evidence of prior diaphragmatic disease, no visible disease.      11/13/2017 Pathology Results   1. 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 - TUMOR INVOLVES THE OVARIAN SURFACE. - RIGHT FALLOPIAN TUBE IS INVOLVED. - SEE ONCOLOGY TABLE. - SEE NOTE. 2. Soft tissue mass,  biopsy, cecal gutter nodule - METASTATIC MUCINOUS ADENOCARCINOMA. 3. Ovary and fallopian tube, left, left ovary and fallopian tube - METASTATIC MUCINOUS ADENOCARCINOMA TO LEFT OVARY AND FALLOPIAN TUBE. 4. Omentum, resection for tumor - METASTATIC MUCINOUS ADENOCARCINOMA TO OMENTUM. 5. Soft tissue mass, biopsy, splenic flexure nodule - METASTATIC MUCINOUS ADENOCARCINOMA. 6. Soft tissue mass, biopsy, cecal implant -  METASTATIC MUCINOUS ADENOCARCINOMA. 7. Soft tissue mass, biopsy, right ureteral peritoneal implant - METASTATIC MUCINOUS ADENOCARCINOMA. 8. Mesentery, small bowel nodule - METASTATIC MUCINOUS ADENOCARCINOMA. Microscopic Comment 1. OVARY or FALLOPIAN TUBE or PRIMARY PERITONEUM: Procedure: Bilateral salpingo-oophorectomy. Specimen Integrity: Intact. Tumor Site: Right ovary. Ovarian Surface Involvement: Present. Fallopian Tube Surface Involvement: Present. Tumor Size: 20 cm. Histologic Type: Mucinous adenocarcinoma. Histologic Grade: Overall G2 (moderately differentiated) (focal areas of poor differentiation are present). Implants: Present. Other Tissue/ Organ Involvement: Left ovary, left fallopian tube and omentum. Largest Extrapelvic Peritoneal Focus: less than 2 cm. See note Peritoneal/Ascitic Fluid: N/A Treatment Effect: No definite or minimal response identified (chemotherapy response score 1 [CRS 1]) Regional Lymph Nodes No lymph nodes submitted or found Number of Lymph Nodes Examined: 0 Pathologic Stage Classification (pTNM, AJCC 8th Edition): ypT3b, ypNX. Representative Tumor Block: 1D and 1E. (NDK:gt, 11/15/17)   11/19/2017 Genetic Testing   Negative genetic testing on the [employer] Myrisk panel.  The [facility] gene panel offered by [employer] includes sequencing and deletion/duplication testing of the following 35 genes: APC, ATM, AXIN2, BARD1, BMPR1A, BRCA1, BRCA2, BRIP1, CHD1, CDK4, CDKN2A, CHEK2, EPCAM (large rearrangement only), HOXB13, (sequencing only), GALNT12, MLH1, MSH2, MSH3 (excluding repetitive portions of exon 1), MSH6, MUTYH, NBN, NTHL1, PALB2, PMS2, PTEN, RAD51C, RAD51D, RNF43, RPS20, SMAD4, STK11, and TP53. Sequencing was performed for select regions of POLE and POLD1, and large rearrangement analysis was performed for select regions of GREM1. The report date is November 19, 2017.  HRD tumor results indicate a BRCA1 mutation identified in the ovarian tumor  causing genomic instability. The report date is 12/04/2017     Genetic Testing   Patient has genetic testing done for ER/PR and Her2/neu. Results revealed patient has the following: ER 15-20% PR 5-10% Her2/neu - negative    Genetic Testing   Patient has genetic testing done for MSI/MMR. Results revealed patient has the following mutation(s): MMR normal, MSI stable   12/10/2017 Imaging   1. Interval resection of large bilateral adnexal cystic masses. Residual soft tissue/tumor within bilateral adnexal regions as above. 2. Interval resolution of previously noted omental cake which may reflect interval omentectomy. There is a new peritoneal deposit identified within the left upper quadrant of the abdomen involving the anterior surface of the spleen.   12/10/2017 Tumor Marker   Patient's tumor was tested for the following markers: CA-125 Results of the tumor marker test revealed 94.9   12/11/2017 - 02/20/2018 Chemotherapy   The patient had FOLFOX regimen for mucinous   01/09/2018 Tumor Marker   Patient's tumor was tested for the following markers: CA-125 Results of the tumor marker test revealed 37   01/31/2018 Imaging   Resolution of peritoneal soft tissue density along the anterior margin of the spleen since prior study. No evidence of residual or progressive metastatic disease.  Mild sigmoid diverticulosis, without radiographic evidence of diverticulitis. Resolution of small pericolonic abscess along the left lateral pelvic sidewall.  Mild hepatic steatosis.   02/06/2018 Tumor Marker   Patient's tumor was tested for the following markers: CA-125 Results of the tumor marker test revealed 36.7   03/06/2018 -  Chemotherapy  The patient is started on [name] as PARP maintenance   03/13/2018 Tumor Marker   Patient's tumor was tested for the following markers: CA-125 Results of the tumor marker test revealed 33.8   04/05/2018 Tumor Marker   Patient's tumor was tested for the  following markers: CA-125 Results of the tumor marker test revealed 27.3   05/03/2018 Tumor Marker   Patient's tumor was tested for the following markers: CA-125 Results of the tumor marker test revealed 23.5   07/08/2018 Tumor Marker   Patient's tumor was tested for the following markers: CA-125 Results of the tumor marker test revealed 19.9   09/02/2018 Tumor Marker   Patient's tumor was tested for the following markers: CA-125 Results of the tumor marker test revealed 17   10/28/2018 Imaging   1. 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 Repeat abdominal MRI with and without IV gadolinium with MRCP is recommended in 2 years to ensure continued stability. This recommendation follows ACR consensus guidelines: Management of Incidental Pancreatic Cysts: A White Paper of the ACR Incidental Findings Committee. [place] 6433;[id number]. 2. Hepatic steatosis. 3. Additional incidental findings, as above.     10/28/2018 Tumor Marker   Patient's tumor was tested for the following markers: CA-125 Results of the tumor marker test revealed 16.9   03/03/2019 Tumor Marker   Patient's tumor was tested for the following markers: CA-125 Results of the tumor marker test revealed 12.7.   07/09/2019 Tumor Marker   Patient's tumor was tested for the following markers: CA-125 Results of the tumor marker test revealed 9.5.   09/02/2019 Tumor Marker   Patient's tumor was tested for the following markers: CA-125 Results of the tumor marker test revealed 11.   10/28/2019 Tumor Marker   Patient's tumor was tested for the following markers: CA-125 Results of the tumor marker test revealed 10.2   12/23/2019 Tumor Marker   Patient's tumor was tested for the following markers: CA-125 Results of the tumor marker test revealed 12.1   02/17/2020 Tumor Marker   Patient's  tumor was tested for the following markers: CA-125 Results of the tumor marker test revealed 9.5   05/25/2020 Tumor Marker   Patient's tumor was tested for the following markers: CA-125 Results of the tumor marker test revealed 9.6     REVIEW OF SYSTEMS:   Constitutional: Denies fevers, chills or abnormal weight loss Eyes: Denies blurriness of vision Ears, nose, mouth, throat, and face: Denies mucositis or sore throat Respiratory: Denies cough, dyspnea or wheezes Cardiovascular: Denies palpitation, chest discomfort or lower extremity swelling Gastrointestinal:  Denies nausea, heartburn or change in bowel habits Skin: Denies abnormal skin rashes Lymphatics: Denies new lymphadenopathy or easy bruising Neurological:Denies numbness, tingling or new weaknesses Behavioral/Psych: Mood is stable, no new changes  All other systems were reviewed with the patient and are negative.  I have reviewed the past medical history, past surgical history, social history and family history with the patient and they are unchanged from previous note.  ALLERGIES:  has No Known Allergies.  MEDICATIONS:  Current Outpatient Medications  Medication Sig Dispense Refill  . acetaminophen (TYLENOL) 500 MG tablet Take 500 mg by mouth every 8 (eight) hours as needed for mild pain or moderate pain.    Marland Kitchen atenolol (TENORMIN) 25 MG tablet Take 25 mg by mouth at bedtime.     Marland Kitchen atorvastatin (LIPITOR) 20 MG tablet  Take 20 mg by mouth daily.    Marland Kitchen CALCIUM-MAGNESIUM-VITAMIN D PO Take 1 tablet by mouth daily.    . famotidine (PEPCID) 20 MG tablet Take 20 mg by mouth as needed for heartburn or indigestion. (Patient not taking: Reported on 04/29/2020)    . lidocaine-prilocaine (EMLA) cream Apply 1 application topically as needed. 30 g 6  . loratadine (CLARITIN) 10 MG tablet Take 10 mg by mouth daily as needed for allergies. (Patient not taking: Reported on 04/29/2020)    . losartan (COZAAR) 50 MG tablet Take 50 mg by mouth at  bedtime.    Marland Kitchen [name] 100 MG tablet TAKE 2 TABLETS (200 MG TOTAL) BY MOUTH 2 (TWO) TIMES DAILY. SWALLOW WHOLE. 120 tablet 11  . metFORMIN (GLUCOPHAGE) 1000 MG tablet Take 500 mg by mouth 2 (two) times daily.   0  . Multiple Vitamin (MULTIVITAMIN WITH MINERALS) TABS tablet Take 1 tablet by mouth daily.     No current facility-administered medications for this visit.    PHYSICAL EXAMINATION: ECOG PERFORMANCE STATUS: 0 - Asymptomatic  Vitals:   07/13/20 1001  BP: 129/66  Pulse: 66  Resp: 18  Temp: 97.9 F (36.6 C)  SpO2: 100%   Filed Weights   07/13/20 1001  Weight: 154 lb 12.8 oz (70.2 kg)    GENERAL:alert, no distress and comfortable SKIN: skin color, texture, turgor are normal, no rashes or significant lesions EYES: normal, Conjunctiva are pink and non-injected, sclera clear OROPHARYNX:no exudate, no erythema and lips, buccal mucosa, and tongue normal  NECK: supple, thyroid normal size, non-tender, without nodularity LYMPH:  no palpable lymphadenopathy in the cervical, axillary or inguinal LUNGS: clear to auscultation and percussion with normal breathing effort HEART: regular rate & rhythm and no murmurs and no lower extremity edema ABDOMEN:abdomen soft, non-tender and normal bowel sounds Musculoskeletal:no cyanosis of digits and no clubbing  NEURO: alert & oriented x 3 with fluent speech, no focal motor/sensory deficits  LABORATORY DATA:  I have reviewed the data as listed    Component Value Date/Time   NA 140 07/13/2020 0942   K 4.1 07/13/2020 0942   CL 106 07/13/2020 0942   CO2 24 07/13/2020 0942   GLUCOSE 165 (H) 07/13/2020 0942   BUN 14 07/13/2020 0942   CREATININE 1.14 (H) 07/13/2020 0942   CREATININE 0.84 04/29/2019 0915   CREATININE 0.86 06/05/2014 0957   CALCIUM 9.0 07/13/2020 0942   PROT 7.2 07/13/2020 0942   ALBUMIN 3.9 07/13/2020 0942   AST 21 07/13/2020 0942   AST 22 04/29/2019 0915   ALT 17 07/13/2020 0942   ALT 18 04/29/2019 0915   ALKPHOS 58  07/13/2020 0942   BILITOT 0.5 07/13/2020 0942   BILITOT 0.6 04/29/2019 0915   GFRNONAA 52 (L) 07/13/2020 0942   GFRNONAA >60 04/29/2019 0915   GFRAA >60 12/23/2019 0954   GFRAA >60 04/29/2019 0915    No results found for: SPEP, UPEP  Lab Results  Component Value Date   WBC 6.7 07/13/2020   NEUTROABS 4.2 07/13/2020   HGB 11.8 (L) 07/13/2020   HCT 35.2 (L) 07/13/2020   MCV 108.6 (H) 07/13/2020   PLT 181 07/13/2020      Chemistry      Component Value Date/Time   NA 140 07/13/2020 0942   K 4.1 07/13/2020 0942   CL 106 07/13/2020 0942   CO2 24 07/13/2020 0942   BUN 14 07/13/2020 0942   CREATININE 1.14 (H) 07/13/2020 0942   CREATININE 0.84 04/29/2019 0915  CREATININE 0.86 06/05/2014 0957      Component Value Date/Time   CALCIUM 9.0 07/13/2020 0942   ALKPHOS 58 07/13/2020 0942   AST 21 07/13/2020 0942   AST 22 04/29/2019 0915   ALT 17 07/13/2020 0942   ALT 18 04/29/2019 0915   BILITOT 0.5 07/13/2020 0942   BILITOT 0.6 04/29/2019 0915

## 2020-07-13 NOTE — Assessment & Plan Note (Signed)
Her last MRI of the abdomen in July 2020 was stable She would need another repeat imaging in July 2022

## 2020-07-14 ENCOUNTER — Telehealth: Payer: Self-pay

## 2020-07-14 LAB — CA 125: Cancer Antigen (CA) 125: 10 U/mL (ref 0.0–38.1)

## 2020-07-14 NOTE — Telephone Encounter (Signed)
Called and given below message. She verbalized understanding. 

## 2020-07-14 NOTE — Telephone Encounter (Signed)
-----   Message from [name], MD sent at 07/14/2020  9:19 AM EDT ----- Pls let her know CA-125 is ok

## 2020-07-16 ENCOUNTER — Other Ambulatory Visit (HOSPITAL_COMMUNITY): Payer: Self-pay

## 2020-07-19 MED FILL — LYNPARZA 100 MG TAB: 100 | 30 days supply | Qty: 120 | Fill #5

## 2020-08-10 ENCOUNTER — Other Ambulatory Visit (HOSPITAL_COMMUNITY): Payer: Self-pay

## 2020-08-10 MED FILL — Olaparib Tab 100 MG: ORAL | 30 days supply | Qty: 120 | Fill #0 | Status: AC

## 2020-08-12 ENCOUNTER — Other Ambulatory Visit (HOSPITAL_COMMUNITY): Payer: Self-pay

## 2020-08-19 ENCOUNTER — Telehealth: Payer: Self-pay | Admitting: Oncology

## 2020-08-19 NOTE — Telephone Encounter (Signed)
Sabrina Vang called and said her lab appointments have been canceled and is wondering what has changed.  Advised her that there is a new process where she does not need to go to lab to pick up her tubes.  Everything will be done at her flush apt.  She verbalized understanding.

## 2020-09-07 ENCOUNTER — Other Ambulatory Visit: Payer: Medicare HMO

## 2020-09-07 ENCOUNTER — Other Ambulatory Visit: Payer: Self-pay

## 2020-09-07 ENCOUNTER — Inpatient Hospital Stay: Payer: Medicare HMO | Attending: Obstetrics

## 2020-09-07 DIAGNOSIS — Z452 Encounter for adjustment and management of vascular access device: Secondary | ICD-10-CM | POA: Insufficient documentation

## 2020-09-07 DIAGNOSIS — C561 Malignant neoplasm of right ovary: Secondary | ICD-10-CM

## 2020-09-07 DIAGNOSIS — Z8543 Personal history of malignant neoplasm of ovary: Secondary | ICD-10-CM | POA: Diagnosis not present

## 2020-09-07 LAB — CBC WITH DIFFERENTIAL/PLATELET
Abs Immature Granulocytes: 0.03 10*3/uL (ref 0.00–0.07)
Basophils Absolute: 0.1 10*3/uL (ref 0.0–0.1)
Basophils Relative: 1 %
Eosinophils Absolute: 0.2 10*3/uL (ref 0.0–0.5)
Eosinophils Relative: 3 %
HCT: 36 % (ref 36.0–46.0)
Hemoglobin: 12 g/dL (ref 12.0–15.0)
Immature Granulocytes: 1 %
Lymphocytes Relative: 28 %
Lymphs Abs: 1.7 10*3/uL (ref 0.7–4.0)
MCH: 36.6 pg — ABNORMAL HIGH (ref 26.0–34.0)
MCHC: 33.3 g/dL (ref 30.0–36.0)
MCV: 109.8 fL — ABNORMAL HIGH (ref 80.0–100.0)
Monocytes Absolute: 0.5 10*3/uL (ref 0.1–1.0)
Monocytes Relative: 8 %
Neutro Abs: 3.7 10*3/uL (ref 1.7–7.7)
Neutrophils Relative %: 59 %
Platelets: 188 10*3/uL (ref 150–400)
RBC: 3.28 MIL/uL — ABNORMAL LOW (ref 3.87–5.11)
RDW: 14.4 % (ref 11.5–15.5)
WBC: 6.2 10*3/uL (ref 4.0–10.5)
nRBC: 0 % (ref 0.0–0.2)

## 2020-09-07 LAB — COMPREHENSIVE METABOLIC PANEL
ALT: 15 U/L (ref 0–44)
AST: 23 U/L (ref 15–41)
Albumin: 3.9 g/dL (ref 3.5–5.0)
Alkaline Phosphatase: 64 U/L (ref 38–126)
Anion gap: 9 (ref 5–15)
BUN: 17 mg/dL (ref 8–23)
CO2: 26 mmol/L (ref 22–32)
Calcium: 9.3 mg/dL (ref 8.9–10.3)
Chloride: 105 mmol/L (ref 98–111)
Creatinine, Ser: 1.02 mg/dL — ABNORMAL HIGH (ref 0.44–1.00)
GFR, Estimated: 59 mL/min — ABNORMAL LOW (ref >60)
Glucose, Bld: 150 mg/dL — ABNORMAL HIGH (ref 70–99)
Potassium: 4.3 mmol/L (ref 3.5–5.1)
Sodium: 140 mmol/L (ref 135–145)
Total Bilirubin: 0.5 mg/dL (ref 0.3–1.2)
Total Protein: 7.1 g/dL (ref 6.5–8.1)

## 2020-09-07 MED ORDER — HEPARIN SOD (PORK) LOCK FLUSH 100 UNIT/ML IV SOLN
500.0000 [IU] | Freq: Once | INTRAVENOUS | Status: AC
  Administered 2020-09-07: 500 [IU]
  Filled 2020-09-07: qty 5

## 2020-09-07 MED ORDER — SODIUM CHLORIDE 0.9% FLUSH
10.0000 mL | Freq: Once | INTRAVENOUS | Status: AC
  Administered 2020-09-07: 10 mL
  Filled 2020-09-07: qty 10

## 2020-09-08 ENCOUNTER — Telehealth: Payer: Self-pay

## 2020-09-08 LAB — CA 125: Cancer Antigen (CA) 125: 10.5 U/mL (ref 0.0–38.1)

## 2020-09-08 NOTE — Telephone Encounter (Signed)
This nurse attempted to call pt to give lab results per Dr Alvy Bimler. Pt did not answer. LVM to return call.

## 2020-09-08 NOTE — Telephone Encounter (Signed)
-----   Message from [name], MD sent at 09/08/2020  8:15 AM EDT ----- Pls call and let her know labs and CA-125 are ok

## 2020-09-09 ENCOUNTER — Encounter (INDEPENDENT_AMBULATORY_CARE_PROVIDER_SITE_OTHER): Payer: Self-pay | Admitting: *Deleted

## 2020-09-15 ENCOUNTER — Other Ambulatory Visit (HOSPITAL_COMMUNITY): Payer: Self-pay

## 2020-09-15 MED FILL — Olaparib Tab 100 MG: ORAL | 30 days supply | Qty: 120 | Fill #1 | Status: AC

## 2020-09-22 ENCOUNTER — Other Ambulatory Visit (HOSPITAL_COMMUNITY): Payer: Self-pay

## 2020-10-13 ENCOUNTER — Other Ambulatory Visit (HOSPITAL_COMMUNITY): Payer: Self-pay

## 2020-10-13 MED FILL — Olaparib Tab 100 MG: ORAL | 30 days supply | Qty: 120 | Fill #2 | Status: AC

## 2020-10-21 ENCOUNTER — Other Ambulatory Visit (HOSPITAL_COMMUNITY): Payer: Self-pay

## 2020-10-22 ENCOUNTER — Encounter: Payer: Self-pay | Admitting: Hematology and Oncology

## 2020-11-01 ENCOUNTER — Inpatient Hospital Stay: Payer: Medicare HMO | Attending: Obstetrics

## 2020-11-01 ENCOUNTER — Ambulatory Visit (HOSPITAL_COMMUNITY)
Admission: RE | Admit: 2020-11-01 | Discharge: 2020-11-01 | Disposition: A | Discharge status: Home / Self Care | Payer: Medicare HMO | Source: Ambulatory Visit | Attending: Hematology and Oncology | Admitting: Hematology and Oncology

## 2020-11-01 ENCOUNTER — Other Ambulatory Visit: Payer: Medicare HMO

## 2020-11-01 ENCOUNTER — Other Ambulatory Visit: Payer: Self-pay

## 2020-11-01 ENCOUNTER — Other Ambulatory Visit: Payer: Self-pay | Admitting: Hematology and Oncology

## 2020-11-01 DIAGNOSIS — C561 Malignant neoplasm of right ovary: Secondary | ICD-10-CM

## 2020-11-01 DIAGNOSIS — C563 Malignant neoplasm of bilateral ovaries: Secondary | ICD-10-CM | POA: Diagnosis present

## 2020-11-01 DIAGNOSIS — K862 Cyst of pancreas: Secondary | ICD-10-CM | POA: Insufficient documentation

## 2020-11-01 DIAGNOSIS — D6481 Anemia due to antineoplastic chemotherapy: Secondary | ICD-10-CM | POA: Diagnosis not present

## 2020-11-01 DIAGNOSIS — E119 Type 2 diabetes mellitus without complications: Secondary | ICD-10-CM | POA: Insufficient documentation

## 2020-11-01 DIAGNOSIS — C786 Secondary malignant neoplasm of retroperitoneum and peritoneum: Secondary | ICD-10-CM | POA: Diagnosis not present

## 2020-11-01 LAB — COMPREHENSIVE METABOLIC PANEL
ALT: 14 U/L (ref 0–44)
AST: 20 U/L (ref 15–41)
Albumin: 3.6 g/dL (ref 3.5–5.0)
Alkaline Phosphatase: 69 U/L (ref 38–126)
Anion gap: 8 (ref 5–15)
BUN: 13 mg/dL (ref 8–23)
CO2: 26 mmol/L (ref 22–32)
Calcium: 9.5 mg/dL (ref 8.9–10.3)
Chloride: 105 mmol/L (ref 98–111)
Creatinine, Ser: 1.04 mg/dL — ABNORMAL HIGH (ref 0.44–1.00)
GFR, Estimated: 58 mL/min — ABNORMAL LOW (ref >60)
Glucose, Bld: 201 mg/dL — ABNORMAL HIGH (ref 70–99)
Potassium: 4.1 mmol/L (ref 3.5–5.1)
Sodium: 139 mmol/L (ref 135–145)
Total Bilirubin: 0.3 mg/dL (ref 0.3–1.2)
Total Protein: 7.3 g/dL (ref 6.5–8.1)

## 2020-11-01 LAB — CBC WITH DIFFERENTIAL/PLATELET
Abs Immature Granulocytes: 0.01 10*3/uL (ref 0.00–0.07)
Basophils Absolute: 0.1 10*3/uL (ref 0.0–0.1)
Basophils Relative: 1 %
Eosinophils Absolute: 0.1 10*3/uL (ref 0.0–0.5)
Eosinophils Relative: 2 %
HCT: 34.8 % — ABNORMAL LOW (ref 36.0–46.0)
Hemoglobin: 11.7 g/dL — ABNORMAL LOW (ref 12.0–15.0)
Immature Granulocytes: 0 %
Lymphocytes Relative: 28 %
Lymphs Abs: 1.8 10*3/uL (ref 0.7–4.0)
MCH: 36.4 pg — ABNORMAL HIGH (ref 26.0–34.0)
MCHC: 33.6 g/dL (ref 30.0–36.0)
MCV: 108.4 fL — ABNORMAL HIGH (ref 80.0–100.0)
Monocytes Absolute: 0.4 10*3/uL (ref 0.1–1.0)
Monocytes Relative: 6 %
Neutro Abs: 4.1 10*3/uL (ref 1.7–7.7)
Neutrophils Relative %: 63 %
Platelets: 185 10*3/uL (ref 150–400)
RBC: 3.21 MIL/uL — ABNORMAL LOW (ref 3.87–5.11)
RDW: 14.5 % (ref 11.5–15.5)
WBC: 6.5 10*3/uL (ref 4.0–10.5)
nRBC: 0 % (ref 0.0–0.2)

## 2020-11-01 MED ORDER — HEPARIN SOD (PORK) LOCK FLUSH 100 UNIT/ML IV SOLN: 500.0000 [IU] | Freq: Once | INTRAVENOUS | Status: DC

## 2020-11-01 MED ORDER — SODIUM CHLORIDE 0.9% FLUSH
10.0000 mL | Freq: Once | INTRAVENOUS | Status: AC
  Administered 2020-11-01: 10 mL
  Filled 2020-11-01: qty 10

## 2020-11-01 MED ORDER — GADOBUTROL 1 MMOL/ML IV SOLN
7.0000 mL | Freq: Once | INTRAVENOUS | Status: AC | PRN
  Administered 2020-11-01: 7 mL via INTRAVENOUS

## 2020-11-01 NOTE — Patient Instructions (Signed)

## 2020-11-02 ENCOUNTER — Inpatient Hospital Stay: Payer: Medicare HMO | Admitting: Hematology and Oncology

## 2020-11-02 ENCOUNTER — Encounter: Payer: Self-pay | Admitting: Hematology and Oncology

## 2020-11-02 DIAGNOSIS — C561 Malignant neoplasm of right ovary: Secondary | ICD-10-CM | POA: Diagnosis not present

## 2020-11-02 DIAGNOSIS — K862 Cyst of pancreas: Secondary | ICD-10-CM | POA: Diagnosis not present

## 2020-11-02 DIAGNOSIS — E119 Type 2 diabetes mellitus without complications: Secondary | ICD-10-CM | POA: Diagnosis not present

## 2020-11-02 DIAGNOSIS — D6481 Anemia due to antineoplastic chemotherapy: Secondary | ICD-10-CM

## 2020-11-02 DIAGNOSIS — C563 Malignant neoplasm of bilateral ovaries: Secondary | ICD-10-CM | POA: Diagnosis not present

## 2020-11-02 DIAGNOSIS — T451X5A Adverse effect of antineoplastic and immunosuppressive drugs, initial encounter: Secondary | ICD-10-CM

## 2020-11-02 DIAGNOSIS — D539 Nutritional anemia, unspecified: Secondary | ICD-10-CM

## 2020-11-02 LAB — CA 125: Cancer Antigen (CA) 125: 18.1 U/mL (ref 0.0–38.1)

## 2020-11-02 NOTE — Progress Notes (Signed)
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[place]) Overall, she tolerated treatment very well without side effects Her tumor marker is stable We discussed the role of treatment will be for at least 2 years; we discussed the risk and benefits of continuing treatment versus stopping for now, she is comfortable to continue on treatment I will see her again in a few months for further follow-up and reviewed the plan of care she is educated to watch for signs and symptoms of cancer recurrence for now, I do not plan routine surveillance imaging study Recommend GYN oncologist to see her once a year  Anemia due to antineoplastic chemotherapy She has mild pancytopenia from treatment but not symptomatic Observe closely for  Pancreatic cyst I have reviewed the MRI findings with the patient's Per recommendation by radiologist, we will repeat imaging study in 6 months, due around January 2023  Type 2 diabetes mellitus treated without insulin ([place]) She has recent hyperglycemia and fatty liver change She will continue aggressive dietary modification  Orders Placed This Encounter  Procedures   Iron and TIBC    Standing Status:   Future    Standing Expiration Date:   11/02/2021   Vitamin B12    Standing Status:   Future    Standing Expiration Date:   11/02/2021   Ferritin    Standing Status:   Future    Standing Expiration Date:   11/02/2021   Sedimentation rate    Standing Status:   Future    Standing Expiration Date:   11/02/2021    All questions were answered. The patient knows to call the clinic with any problems, questions or concerns. The total time spent in the appointment was 30 minutes encounter with patients including review of chart and various tests results, discussions about plan of care and coordination of care plan   [name], MD 11/02/2020 4:30  PM  INTERVAL HISTORY: Please see below for problem oriented charting. She returns for further follow-up She is compliant taking her [name] as directed The patient is grieving the death of her mother recently She is doing better She tolerated [place] well without major side effects No recent infection, fever or chills She denies abdominal pain or changes in bowel habits The patient denies any recent signs or symptoms of bleeding such as spontaneous epistaxis, hematuria or hematochezia.   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 The left adnexa measured 12.7 x 11.6 x 8.1 with low level echoes and soft tissue nodules    07/30/2017 Tumor Marker   Patient's tumor was tested for the following markers: CA-125 Results of the tumor marker test revealed 521.3    08/17/2017 Pathology Results   The malignant cells are positive for PAX-8, cytokeratin 7, estrogen receptor, and faintly positive for GATA-3. They are negative for p53, GCDFP, and cytokeratin 20. The finding are consistent with a gynecologic primary carcinoma. Additional studies can be performed upon clinician request.    08/17/2017 Procedure   Technically successful CT-guided left lower quadrant omental mass core  biopsy.    08/20/2017 PET scan   1. Cystic masses arising from the pelvis. The nodular component of the RIGHT cystic mass is intensely hypermetabolic consistent with malignant ovarian neoplasm. 2. Extensive hypermetabolic peritoneal thickening in the lower abdomen and upper pelvis, upper abdomen, and upper abdominal precordial fat and paradiaphragmatic fat. 3.  Retroperitoneal nodal metastasis adjacent to the IVC at the level the kidneys. 4. No evidence of metastatic disease in the thorax other small effusion on the LEFT and nodal metastasis in the fat superior to the diaphragm. 5. Mild metabolic activity associated the distal esophagus is favored benign esophagitis.    08/20/2017 Imaging   CT chest 1. Bilateral cardiophrenic angle nodal metastasis. No additional findings to suggest metastatic disease to the chest. 2. Small left pleural effusion. 3. Peritoneal carcinomatosis noted within the abdomen. 4. Hepatic steatosis.      08/24/2017 Cancer Staging   Staging form: Ovary, Fallopian Tube, and Primary Peritoneal Carcinoma, AJCC 8th Edition - Clinical: Stage IV (cT3, cN1, cM1) - Signed by [name], MD on 08/24/2017    08/31/2017 Tumor Marker   Patient's tumor was tested for the following markers: CA-125 Results of the tumor marker test revealed 819.9    09/26/2017 Adverse Reaction   She developed reaction to Taxol, managed successfully with additional premedications    10/17/2017 Tumor Marker   Patient's tumor was tested for the following markers: CA-125 Results of the tumor marker test revealed 374.4    10/31/2017 Imaging   1. No significant change in size of the large complex bilateral adnexal masses consistent with the known history of ovarian cancer. There is increased dependent density within the left-sided lesion. 2. Stable peritoneal carcinomatosis.  No significant ascites.  3. The bilateral pericardiac adenopathy has improved. No progressive thoracic metastatic disease. No pulmonary or osseous metastatic disease. 4. Suspicion of nonocclusive thrombus in the right internal jugular vein related to the right IJ port. Recommend further evaluation with Doppler ultrasound.    11/13/2017 Surgery   Preoperative Diagnosis: Ovarian cancer s/p neoadjuvant chemotherapy    Procedure(s) Performed:   Exploratory laparotomy  Bilateral  salpingo-oophorectomy with radical tumor debulking for ovarian cancer  including retroperitoneal dissection, lysis of adhesions (enterolysis of small bowel in deep pelvis, left adnexal adhesions to sigmoid and omentum to LLQ) ~1 hour, ureterolysis. Omentectomy  Takedown of splenic flexure with removal of omental tumor in left upper quadrant    Specimens: Bilateral tubes and ovaries, omentum, splenic flexure nodule, right paracolic gutter nodule, cecal nodule, right ureteral peritoneal nodule, small bowel mesentary.    Indication for Procedure:  Patient is s/p 3 cycles of chemotherapy with response based on CA125 and decreased mediastinal disease (to <1cm).   Operative Findings:  This represented an optimal cytoreduction with gross residual disease remaining in the deep pelvis near the levator floor and possibly in the region of the right IP ligament/periappendicial region.   Upon entry a large ~20cm right tube/ovary, mostly cystic. Adherent rind along ileocecal region and appendix. Large cystic left tube/ovary ~10cm with sigmoid colon stretched across the mass and extension of the cystic lesion into the deep pelvis with residual palpable disease deep pelvis near levators. Estimate ~1cm or less on palpation, not visible disease. Omental caking in the LLQ adherent to pelvic sidewall. Omental disease noted RUQ separate. In addition ~1.5cm lesion in splenic flexure omentum. Evidence of prior diaphragmatic disease, no visible disease.       11/13/2017 Pathology Results   1. Ovary and fallopian tube, right -  INVASIVE MUCINOUS ADENOCARCINOMA OF THE RIGHT OVARY, 20 CM. - TUMOR INVOLVES THE OVARIAN SURFACE. - RIGHT FALLOPIAN TUBE IS INVOLVED. - SEE ONCOLOGY TABLE. - SEE NOTE. 2. Soft tissue mass, biopsy, cecal gutter nodule - METASTATIC MUCINOUS ADENOCARCINOMA. 3. Ovary and fallopian tube, left, left ovary and fallopian tube - METASTATIC MUCINOUS ADENOCARCINOMA TO LEFT OVARY AND FALLOPIAN TUBE. 4.  Omentum, resection for tumor - METASTATIC MUCINOUS ADENOCARCINOMA TO OMENTUM. 5. Soft tissue mass, biopsy, splenic flexure nodule - METASTATIC MUCINOUS ADENOCARCINOMA. 6. Soft tissue mass, biopsy, cecal implant - METASTATIC MUCINOUS ADENOCARCINOMA. 7. Soft tissue mass, biopsy, right ureteral peritoneal implant - METASTATIC MUCINOUS ADENOCARCINOMA. 8. Mesentery, small bowel nodule - METASTATIC MUCINOUS ADENOCARCINOMA. Microscopic Comment 1. OVARY or FALLOPIAN TUBE or PRIMARY PERITONEUM: Procedure: Bilateral salpingo-oophorectomy. Specimen Integrity: Intact. Tumor Site: Right ovary. Ovarian Surface Involvement: Present. Fallopian Tube Surface Involvement: Present. Tumor Size: 20 cm. Histologic Type: Mucinous adenocarcinoma. Histologic Grade: Overall G2 (moderately differentiated) (focal areas of poor differentiation are present). Implants: Present. Other Tissue/ Organ Involvement: Left ovary, left fallopian tube and omentum. Largest Extrapelvic Peritoneal Focus: less than 2 cm. See note Peritoneal/Ascitic Fluid: N/A Treatment Effect: No definite or minimal response identified (chemotherapy response score 1 [CRS 1]) Regional Lymph Nodes No lymph nodes submitted or found Number of Lymph Nodes Examined: 0 Pathologic Stage Classification (pTNM, AJCC 8th Edition): ypT3b, ypNX. Representative Tumor Block: 1D and 1E. (NDK:gt, 11/15/17)    11/19/2017 Genetic Testing   Negative genetic testing on the [facility] Myrisk panel.  The [facility] gene panel offered by [employer] includes sequencing and deletion/duplication testing of the following 35 genes: APC, ATM, AXIN2, BARD1, BMPR1A, BRCA1, BRCA2, BRIP1, CHD1, CDK4, CDKN2A, CHEK2, EPCAM (large rearrangement only), HOXB13, (sequencing only), GALNT12, MLH1, MSH2, MSH3 (excluding repetitive portions of exon 1), MSH6, MUTYH, NBN, NTHL1, PALB2, PMS2, PTEN, RAD51C, RAD51D, RNF43, RPS20, SMAD4, STK11, and TP53. Sequencing was performed for  select regions of POLE and POLD1, and large rearrangement analysis was performed for select regions of GREM1. The report date is November 19, 2017.  HRD tumor results indicate a BRCA1 mutation identified in the ovarian tumor causing genomic instability. The report date is 12/04/2017      Genetic Testing   Patient has genetic testing done for ER/PR and Her2/neu. Results revealed patient has the following: ER 15-20% PR 5-10% Her2/neu - negative     Genetic Testing   Patient has genetic testing done for MSI/MMR. Results revealed patient has the following mutation(s): MMR normal, MSI stable    12/10/2017 Imaging   1. Interval resection of large bilateral adnexal cystic masses. Residual soft tissue/tumor within bilateral adnexal regions as above. 2. Interval resolution of previously noted omental cake which may reflect interval omentectomy. There is a new peritoneal deposit identified within the left upper quadrant of the abdomen involving the anterior surface of the spleen.    12/10/2017 Tumor Marker   Patient's tumor was tested for the following markers: CA-125 Results of the tumor marker test revealed 94.9    12/11/2017 - 02/20/2018 Chemotherapy   The patient had FOLFOX regimen for mucinous    01/09/2018 Tumor Marker   Patient's tumor was tested for the following markers: CA-125 Results of the tumor marker test revealed 37    01/31/2018 Imaging   Resolution of peritoneal soft tissue density along the anterior margin of the spleen since prior study. No evidence of residual or progressive metastatic disease.  Mild sigmoid diverticulosis, without radiographic evidence of diverticulitis. Resolution of small pericolonic abscess along the  left lateral pelvic sidewall.  Mild hepatic steatosis.    02/06/2018 Tumor Marker   Patient's tumor was tested for the following markers: CA-125 Results of the tumor marker test revealed 36.7    03/06/2018 -  Chemotherapy   The patient is started  on [name] as PARP maintenance    03/13/2018 Tumor Marker   Patient's tumor was tested for the following markers: CA-125 Results of the tumor marker test revealed 33.8    04/05/2018 Tumor Marker   Patient's tumor was tested for the following markers: CA-125 Results of the tumor marker test revealed 27.3    05/03/2018 Tumor Marker   Patient's tumor was tested for the following markers: CA-125 Results of the tumor marker test revealed 23.5    07/08/2018 Tumor Marker   Patient's tumor was tested for the following markers: CA-125 Results of the tumor marker test revealed 19.9    09/02/2018 Tumor Marker   Patient's tumor was tested for the following markers: CA-125 Results of the tumor marker test revealed 17    10/28/2018 Imaging   1. 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 Repeat abdominal MRI with and without IV gadolinium with MRCP is recommended in 2 years to ensure continued stability. This recommendation follows ACR consensus guidelines: Management of Incidental Pancreatic Cysts: A White Paper of the ACR Incidental Findings Committee. [place] [id number]. 2. Hepatic steatosis. 3. Additional incidental findings, as above.     10/28/2018 Tumor Marker   Patient's tumor was tested for the following markers: CA-125 Results of the tumor marker test revealed 16.9   03/03/2019 Tumor Marker   Patient's tumor was tested for the following markers: CA-125 Results of the tumor marker test revealed 12.7.   07/09/2019 Tumor Marker   Patient's tumor was tested for the following markers: CA-125 Results of the tumor marker test revealed 9.5.   09/02/2019 Tumor Marker   Patient's tumor was tested for the following markers: CA-125 Results of the tumor marker test revealed 11.   10/28/2019 Tumor Marker   Patient's tumor was tested for the  following markers: CA-125 Results of the tumor marker test revealed 10.2   12/23/2019 Tumor Marker   Patient's tumor was tested for the following markers: CA-125 Results of the tumor marker test revealed 12.1   02/17/2020 Tumor Marker   Patient's tumor was tested for the following markers: CA-125 Results of the tumor marker test revealed 9.5   05/25/2020 Tumor Marker   Patient's tumor was tested for the following markers: CA-125 Results of the tumor marker test revealed 9.6   07/13/2020 Tumor Marker   Patient's tumor was tested for the following markers: CA-125 Results of the tumor marker test revealed 10.   11/01/2020 Tumor Marker   Patient's tumor was tested for the following markers: CA-125. Results of the tumor marker test revealed 18.1.   11/01/2020 Imaging   1. Cystic pancreatic lesions, potentially small intraductal papillary mucinous neoplasm. Potential slight increase in size of 1 of these areas in particular near the tail of the pancreas with perhaps mild increase in ventral duct dilation but no main pancreatic duct dilation. Given patient history consider six-month follow-up to assess for stability. 7 mm as the largest area with potential increase in size from 3 mm. 2. Hepatic steatosis. 3. Postoperative changes in the midline of the abdomen.     REVIEW OF SYSTEMS:  Constitutional: Denies fevers, chills or abnormal weight loss Eyes: Denies blurriness of vision Ears, nose, mouth, throat, and face: Denies mucositis or sore throat Respiratory: Denies cough, dyspnea or wheezes Cardiovascular: Denies palpitation, chest discomfort or lower extremity swelling Gastrointestinal:  Denies nausea, heartburn or change in bowel habits Skin: Denies abnormal skin rashes Lymphatics: Denies new lymphadenopathy or easy bruising Neurological:Denies numbness, tingling or new weaknesses Behavioral/Psych: Mood is stable, no new changes  All other systems were reviewed with the patient and are  negative.  I have reviewed the past medical history, past surgical history, social history and family history with the patient and they are unchanged from previous note.  ALLERGIES:  has No Known Allergies.  MEDICATIONS:  Current Outpatient Medications  Medication Sig Dispense Refill   acetaminophen (TYLENOL) 500 MG tablet Take 500 mg by mouth every 8 (eight) hours as needed for mild pain or moderate pain.     atenolol (TENORMIN) 25 MG tablet Take 25 mg by mouth at bedtime.      atorvastatin (LIPITOR) 20 MG tablet Take 20 mg by mouth daily.     CALCIUM-MAGNESIUM-VITAMIN D PO Take 1 tablet by mouth daily.     famotidine (PEPCID) 20 MG tablet Take 20 mg by mouth as needed for heartburn or indigestion. (Patient not taking: Reported on 04/29/2020)     lidocaine-prilocaine (EMLA) cream Apply 1 application topically as needed. 30 g 6   loratadine (CLARITIN) 10 MG tablet Take 10 mg by mouth daily as needed for allergies. (Patient not taking: Reported on 04/29/2020)     losartan (COZAAR) 50 MG tablet Take 50 mg by mouth at bedtime.     metFORMIN (GLUCOPHAGE) 1000 MG tablet Take 500 mg by mouth 2 (two) times daily.   0   Multiple Vitamin (MULTIVITAMIN WITH MINERALS) TABS tablet Take 1 tablet by mouth daily.     olaparib ([name]) 100 MG tablet TAKE 2 TABLETS (200 MG TOTAL) BY MOUTH 2 (TWO) TIMES DAILY. SWALLOW WHOLE. 120 tablet 11   No current facility-administered medications for this visit.    PHYSICAL EXAMINATION: ECOG PERFORMANCE STATUS: 0 - Asymptomatic  Vitals:   11/02/20 1009  BP: 135/62  Pulse: 70  Resp: 18  Temp: 98.3 F (36.8 C)  SpO2: 99%   Filed Weights   11/02/20 1009  Weight: 153 lb 12.8 oz (69.8 kg)    GENERAL:alert, no distress and comfortable SKIN: skin color, texture, turgor are normal, no rashes or significant lesions EYES: normal, Conjunctiva are pink and non-injected, sclera clear OROPHARYNX:no exudate, no erythema and lips, buccal mucosa, and tongue normal   NECK: supple, thyroid normal size, non-tender, without nodularity LYMPH:  no palpable lymphadenopathy in the cervical, axillary or inguinal LUNGS: clear to auscultation and percussion with normal breathing effort HEART: regular rate & rhythm and no murmurs and no lower extremity edema ABDOMEN:abdomen soft, non-tender and normal bowel sounds Musculoskeletal:no cyanosis of digits and no clubbing  NEURO: alert & oriented x 3 with fluent speech, no focal motor/sensory deficits  LABORATORY DATA:  I have reviewed the data as listed    Component Value Date/Time   NA 139 11/01/2020 0904   K 4.1 11/01/2020 0904   CL 105 11/01/2020 0904   CO2 26 11/01/2020 0904   GLUCOSE 201 (H) 11/01/2020 0904   BUN 13 11/01/2020 0904   CREATININE 1.04 (H) 11/01/2020 0904   CREATININE 0.84 04/29/2019 0915   CREATININE 0.86 06/05/2014 0957   CALCIUM 9.5 11/01/2020 0904   PROT 7.3 11/01/2020 0904  ALBUMIN 3.6 11/01/2020 0904   AST 20 11/01/2020 0904   AST 22 04/29/2019 0915   ALT 14 11/01/2020 0904   ALT 18 04/29/2019 0915   ALKPHOS 69 11/01/2020 0904   BILITOT 0.3 11/01/2020 0904   BILITOT 0.6 04/29/2019 0915   GFRNONAA 58 (L) 11/01/2020 0904   GFRNONAA >60 04/29/2019 0915   GFRAA >60 12/23/2019 0954   GFRAA >60 04/29/2019 0915    No results found for: SPEP, UPEP  Lab Results  Component Value Date   WBC 6.5 11/01/2020   NEUTROABS 4.1 11/01/2020   HGB 11.7 (L) 11/01/2020   HCT 34.8 (L) 11/01/2020   MCV 108.4 (H) 11/01/2020   PLT 185 11/01/2020      Chemistry      Component Value Date/Time   NA 139 11/01/2020 0904   K 4.1 11/01/2020 0904   CL 105 11/01/2020 0904   CO2 26 11/01/2020 0904   BUN 13 11/01/2020 0904   CREATININE 1.04 (H) 11/01/2020 0904   CREATININE 0.84 04/29/2019 0915   CREATININE 0.86 06/05/2014 0957      Component Value Date/Time   CALCIUM 9.5 11/01/2020 0904   ALKPHOS 69 11/01/2020 0904   AST 20 11/01/2020 0904   AST 22 04/29/2019 0915   ALT 14 11/01/2020 0904    ALT 18 04/29/2019 0915   BILITOT 0.3 11/01/2020 0904   BILITOT 0.6 04/29/2019 0915       RADIOGRAPHIC STUDIES: I have reviewed multiple imaging studies with the patient I have personally reviewed the radiological images as listed and agreed with the findings in the report. MR Abdomen W Wo Contrast  Result Date: 11/02/2020 CLINICAL DATA:  History of RIGHT ovarian epithelial neoplasm post surgery and chemotherapy. Follow-up evaluation in a high-risk patient. EXAM: MRI ABDOMEN WITHOUT AND WITH CONTRAST TECHNIQUE: Multiplanar multisequence MR imaging of the abdomen was performed both before and after the administration of intravenous contrast. CONTRAST:  39mL GADAVIST GADOBUTROL 1 MMOL/ML IV SOLN COMPARISON:  October 23, 2018. FINDINGS: Lower chest: Lung bases are clear. No effusion. No consolidative changes. Limited assessment on MRI. Hepatobiliary: Mild hepatic steatosis. Post cholecystectomy. No signs of biliary duct dilation. No focal, suspicious hepatic lesion. Pancreas: Intrinsic T1 signal is preserved in the pancreas. No signs of pancreatic inflammation. No main duct dilation. Tiny cystic pancreatic lesions in the pancreas both in the head in the tail without change, for instance a subcentimeter lesion on image 27 of series 4 5 mm shows no change. Another small focus of cystic change in the head of the pancreas and or ventral duct dilation with stable appearance. Tiny focus of cystic change in the neck of the pancreas 3 mm (image 23/5) also stable. Slight increase in prominence of the ventral pancreatic duct passing posterior to the pancreatic head measuring approximately 3-4 mm. (Image 5) 5 mm T2 bright area in the tail of pancreas without change 7 mm cystic focus in the tail of the pancreas (image 18/5). This was perhaps 4 mm on the previous study. Slice thickness varies from study to study in there is some motion in this area on the prior exam. Adjacent small cystic foci in the tail on image 19 of  series 5 are also stable. No areas of abnormal enhancement. Spleen:  Spleen normal size and contour.  No focal lesion. Adrenals/Urinary Tract: Adrenal glands are normal. The no suspicious focal renal lesion. No hydronephrosis. No perinephric stranding. RIGHT-sided renal cysts. Stomach/Bowel: No acute gastrointestinal process to the extent evaluated on abdominal MRI.  Postoperative changes in the midline of the abdomen. Small ventral hernia contains a portion of small bowel without signs of bowel dilation or inflammation. Vascular/Lymphatic: No pathologically enlarged lymph nodes identified. No abdominal aortic aneurysm demonstrated. Other:  None Musculoskeletal: No suspicious bone lesions identified. IMPRESSION: 1. Cystic pancreatic lesions, potentially small intraductal papillary mucinous neoplasm. Potential slight increase in size of 1 of these areas in particular near the tail of the pancreas with perhaps mild increase in ventral duct dilation but no main pancreatic duct dilation. Given patient history consider six-month follow-up to assess for stability. 7 mm as the largest area with potential increase in size from 3 mm. 2. Hepatic steatosis. 3. Postoperative changes in the midline of the abdomen. Electronically Signed   By: Zetta Bills M.D.   On: 11/02/2020 07:45   MR 3D Recon At Scanner  Result Date: 11/02/2020 CLINICAL DATA:  History of RIGHT ovarian epithelial neoplasm post surgery and chemotherapy. Follow-up evaluation in a high-risk patient. EXAM: MRI ABDOMEN WITHOUT AND WITH CONTRAST TECHNIQUE: Multiplanar multisequence MR imaging of the abdomen was performed both before and after the administration of intravenous contrast. CONTRAST:  34m GADAVIST GADOBUTROL 1 MMOL/ML IV SOLN COMPARISON:  October 23, 2018. FINDINGS: Lower chest: Lung bases are clear. No effusion. No consolidative changes. Limited assessment on MRI. Hepatobiliary: Mild hepatic steatosis. Post cholecystectomy. No signs of biliary duct  dilation. No focal, suspicious hepatic lesion. Pancreas: Intrinsic T1 signal is preserved in the pancreas. No signs of pancreatic inflammation. No main duct dilation. Tiny cystic pancreatic lesions in the pancreas both in the head in the tail without change, for instance a subcentimeter lesion on image 27 of series 4 5 mm shows no change. Another small focus of cystic change in the head of the pancreas and or ventral duct dilation with stable appearance. Tiny focus of cystic change in the neck of the pancreas 3 mm (image 23/5) also stable. Slight increase in prominence of the ventral pancreatic duct passing posterior to the pancreatic head measuring approximately 3-4 mm. (Image 5) 5 mm T2 bright area in the tail of pancreas without change 7 mm cystic focus in the tail of the pancreas (image 18/5). This was perhaps 4 mm on the previous study. Slice thickness varies from study to study in there is some motion in this area on the prior exam. Adjacent small cystic foci in the tail on image 19 of series 5 are also stable. No areas of abnormal enhancement. Spleen:  Spleen normal size and contour.  No focal lesion. Adrenals/Urinary Tract: Adrenal glands are normal. The no suspicious focal renal lesion. No hydronephrosis. No perinephric stranding. RIGHT-sided renal cysts. Stomach/Bowel: No acute gastrointestinal process to the extent evaluated on abdominal MRI. Postoperative changes in the midline of the abdomen. Small ventral hernia contains a portion of small bowel without signs of bowel dilation or inflammation. Vascular/Lymphatic: No pathologically enlarged lymph nodes identified. No abdominal aortic aneurysm demonstrated. Other:  None Musculoskeletal: No suspicious bone lesions identified. IMPRESSION: 1. Cystic pancreatic lesions, potentially small intraductal papillary mucinous neoplasm. Potential slight increase in size of 1 of these areas in particular near the tail of the pancreas with perhaps mild increase in  ventral duct dilation but no main pancreatic duct dilation. Given patient history consider six-month follow-up to assess for stability. 7 mm as the largest area with potential increase in size from 3 mm. 2. Hepatic steatosis. 3. Postoperative changes in the midline of the abdomen. Electronically Signed   By: GJewel BaizeD.  On: 11/02/2020 07:45

## 2020-11-02 NOTE — Assessment & Plan Note (Signed)
She has mild pancytopenia from treatment but not symptomatic Observe closely for

## 2020-11-02 NOTE — Assessment & Plan Note (Signed)
I have reviewed the MRI findings with the patient's Per recommendation by radiologist, we will repeat imaging study in 6 months, due around January 2023

## 2020-11-02 NOTE — Assessment & Plan Note (Signed)
Overall, she tolerated treatment very well without side effects Her tumor marker is stable We discussed the role of treatment will be for at least 2 years; we discussed the risk and benefits of continuing treatment versus stopping for now, she is comfortable to continue on treatment I will see her again in a few months for further follow-up and reviewed the plan of care she is educated to watch for signs and symptoms of cancer recurrence for now, I do not plan routine surveillance imaging study Recommend GYN oncologist to see her once a year

## 2020-11-02 NOTE — Assessment & Plan Note (Signed)
She has recent hyperglycemia and fatty liver change She will continue aggressive dietary modification

## 2020-11-12 ENCOUNTER — Other Ambulatory Visit (HOSPITAL_COMMUNITY): Payer: Self-pay

## 2020-11-12 MED FILL — Olaparib Tab 100 MG: ORAL | 30 days supply | Qty: 120 | Fill #3 | Status: CN

## 2020-11-16 ENCOUNTER — Telehealth: Payer: Self-pay

## 2020-11-16 ENCOUNTER — Encounter: Payer: Self-pay | Admitting: Hematology and Oncology

## 2020-11-16 ENCOUNTER — Other Ambulatory Visit (HOSPITAL_COMMUNITY): Payer: Self-pay

## 2020-11-16 NOTE — Telephone Encounter (Signed)
Oral Oncology Patient Advocate Encounter  Met patient in [place] to complete application for [place] Patient Assistance Program in an effort to reduce the patient's out of pocket expense for [name] to $0.    Application completed and faxed to [phone].   [place] patient assistance phone number for follow up is [phone].   This encounter will be updated until final determination.  [place] Patient [place] Phone [phone] Fax [phone] 11/16/2020 3:37 PM

## 2020-11-24 NOTE — Telephone Encounter (Signed)
Patient is approved for [name] at no cost from [facility] 11/18/20-04/23/21.   [facility] uses [facility].  [place] Patient [place] Phone [phone] Fax [phone] 11/24/2020 4:00 PM

## 2020-12-10 ENCOUNTER — Other Ambulatory Visit (HOSPITAL_COMMUNITY): Payer: Self-pay

## 2020-12-14 ENCOUNTER — Other Ambulatory Visit: Payer: Self-pay

## 2020-12-14 ENCOUNTER — Inpatient Hospital Stay: Payer: Medicare HMO | Attending: Obstetrics

## 2020-12-14 DIAGNOSIS — Z452 Encounter for adjustment and management of vascular access device: Secondary | ICD-10-CM | POA: Diagnosis not present

## 2020-12-14 DIAGNOSIS — C563 Malignant neoplasm of bilateral ovaries: Secondary | ICD-10-CM | POA: Diagnosis not present

## 2020-12-14 DIAGNOSIS — C561 Malignant neoplasm of right ovary: Secondary | ICD-10-CM

## 2020-12-14 MED ORDER — HEPARIN SOD (PORK) LOCK FLUSH 100 UNIT/ML IV SOLN
500.0000 [IU] | Freq: Once | INTRAVENOUS | Status: AC
  Administered 2020-12-14: 500 [IU]

## 2020-12-14 MED ORDER — SODIUM CHLORIDE 0.9% FLUSH
10.0000 mL | Freq: Once | INTRAVENOUS | Status: AC
  Administered 2020-12-14: 10 mL

## 2021-01-10 ENCOUNTER — Other Ambulatory Visit (HOSPITAL_COMMUNITY): Payer: Self-pay | Admitting: Internal Medicine

## 2021-01-10 DIAGNOSIS — Z1231 Encounter for screening mammogram for malignant neoplasm of breast: Secondary | ICD-10-CM

## 2021-01-25 ENCOUNTER — Inpatient Hospital Stay: Payer: Medicare HMO | Admitting: Hematology and Oncology

## 2021-01-25 ENCOUNTER — Inpatient Hospital Stay: Payer: Medicare HMO | Attending: Hematology and Oncology

## 2021-01-25 ENCOUNTER — Inpatient Hospital Stay: Payer: Medicare HMO

## 2021-01-25 ENCOUNTER — Other Ambulatory Visit: Payer: Self-pay

## 2021-01-25 VITALS — BP 128/62 | HR 67 | Temp 97.4°F | Resp 18 | Ht 65.0 in | Wt 151.8 lb

## 2021-01-25 DIAGNOSIS — K862 Cyst of pancreas: Secondary | ICD-10-CM | POA: Insufficient documentation

## 2021-01-25 DIAGNOSIS — Z452 Encounter for adjustment and management of vascular access device: Secondary | ICD-10-CM | POA: Diagnosis not present

## 2021-01-25 DIAGNOSIS — C561 Malignant neoplasm of right ovary: Secondary | ICD-10-CM | POA: Diagnosis not present

## 2021-01-25 DIAGNOSIS — Z5112 Encounter for antineoplastic immunotherapy: Secondary | ICD-10-CM | POA: Insufficient documentation

## 2021-01-25 DIAGNOSIS — D539 Nutritional anemia, unspecified: Secondary | ICD-10-CM

## 2021-01-25 DIAGNOSIS — D6481 Anemia due to antineoplastic chemotherapy: Secondary | ICD-10-CM | POA: Insufficient documentation

## 2021-01-25 DIAGNOSIS — Z23 Encounter for immunization: Secondary | ICD-10-CM | POA: Diagnosis not present

## 2021-01-25 DIAGNOSIS — Z5111 Encounter for antineoplastic chemotherapy: Secondary | ICD-10-CM | POA: Insufficient documentation

## 2021-01-25 DIAGNOSIS — C563 Malignant neoplasm of bilateral ovaries: Secondary | ICD-10-CM | POA: Diagnosis present

## 2021-01-25 DIAGNOSIS — I10 Essential (primary) hypertension: Secondary | ICD-10-CM | POA: Diagnosis not present

## 2021-01-25 DIAGNOSIS — T451X5A Adverse effect of antineoplastic and immunosuppressive drugs, initial encounter: Secondary | ICD-10-CM

## 2021-01-25 LAB — CBC WITH DIFFERENTIAL/PLATELET
Abs Immature Granulocytes: 0.02 10*3/uL (ref 0.00–0.07)
Basophils Absolute: 0 10*3/uL (ref 0.0–0.1)
Basophils Relative: 1 %
Eosinophils Absolute: 0.1 10*3/uL (ref 0.0–0.5)
Eosinophils Relative: 1 %
HCT: 35.2 % — ABNORMAL LOW (ref 36.0–46.0)
Hemoglobin: 11.8 g/dL — ABNORMAL LOW (ref 12.0–15.0)
Immature Granulocytes: 0 %
Lymphocytes Relative: 26 %
Lymphs Abs: 1.7 10*3/uL (ref 0.7–4.0)
MCH: 36.6 pg — ABNORMAL HIGH (ref 26.0–34.0)
MCHC: 33.5 g/dL (ref 30.0–36.0)
MCV: 109.3 fL — ABNORMAL HIGH (ref 80.0–100.0)
Monocytes Absolute: 0.4 10*3/uL (ref 0.1–1.0)
Monocytes Relative: 6 %
Neutro Abs: 4.3 10*3/uL (ref 1.7–7.7)
Neutrophils Relative %: 66 %
Platelets: 173 10*3/uL (ref 150–400)
RBC: 3.22 MIL/uL — ABNORMAL LOW (ref 3.87–5.11)
RDW: 14.9 % (ref 11.5–15.5)
WBC: 6.5 10*3/uL (ref 4.0–10.5)
nRBC: 0 % (ref 0.0–0.2)

## 2021-01-25 LAB — VITAMIN B12: Vitamin B-12: 225 pg/mL (ref 180–914)

## 2021-01-25 LAB — COMPREHENSIVE METABOLIC PANEL
ALT: 15 U/L (ref 0–44)
AST: 22 U/L (ref 15–41)
Albumin: 3.9 g/dL (ref 3.5–5.0)
Alkaline Phosphatase: 58 U/L (ref 38–126)
Anion gap: 10 (ref 5–15)
BUN: 16 mg/dL (ref 8–23)
CO2: 24 mmol/L (ref 22–32)
Calcium: 9.4 mg/dL (ref 8.9–10.3)
Chloride: 107 mmol/L (ref 98–111)
Creatinine, Ser: 1.08 mg/dL — ABNORMAL HIGH (ref 0.44–1.00)
GFR, Estimated: 55 mL/min — ABNORMAL LOW (ref >60)
Glucose, Bld: 154 mg/dL — ABNORMAL HIGH (ref 70–99)
Potassium: 4.3 mmol/L (ref 3.5–5.1)
Sodium: 141 mmol/L (ref 135–145)
Total Bilirubin: 0.6 mg/dL (ref 0.3–1.2)
Total Protein: 7.2 g/dL (ref 6.5–8.1)

## 2021-01-25 LAB — IRON AND TIBC
Iron: 77 ug/dL (ref 41–142)
Saturation Ratios: 28 % (ref 21–57)
TIBC: 274 ug/dL (ref 236–444)
UIBC: 197 ug/dL (ref 120–384)

## 2021-01-25 LAB — FERRITIN: Ferritin: 71 ng/mL (ref 11–307)

## 2021-01-25 LAB — SEDIMENTATION RATE: Sed Rate: 30 mm/hr — ABNORMAL HIGH (ref 0–22)

## 2021-01-25 MED ORDER — INFLUENZA VAC A&B SA ADJ QUAD 0.5 ML IM PRSY
0.5000 mL | PREFILLED_SYRINGE | Freq: Once | INTRAMUSCULAR | Status: AC
  Administered 2021-01-25: 0.5 mL via INTRAMUSCULAR
  Filled 2021-01-25: qty 0.5

## 2021-01-25 MED ORDER — HEPARIN SOD (PORK) LOCK FLUSH 100 UNIT/ML IV SOLN
500.0000 [IU] | Freq: Once | INTRAVENOUS | Status: AC
Start: 2021-01-25 — End: 2021-01-25
  Administered 2021-01-25: 500 [IU]

## 2021-01-25 MED ORDER — SODIUM CHLORIDE 0.9% FLUSH
10.0000 mL | Freq: Once | INTRAVENOUS | Status: AC
  Administered 2021-01-25: 10 mL

## 2021-01-26 ENCOUNTER — Telehealth: Payer: Self-pay | Admitting: Oncology

## 2021-01-26 ENCOUNTER — Encounter: Payer: Self-pay | Admitting: Hematology and Oncology

## 2021-01-26 LAB — CA 125: Cancer Antigen (CA) 125: 220 U/mL — ABNORMAL HIGH (ref 0.0–38.1)

## 2021-01-26 NOTE — Progress Notes (Signed)
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 However, the patient is at high risk of cancer relapse She is still taking olaparib, without major side effects except for mild anemia At the time of dictation, her CA125 came back markedly elevated I explained to the patient the rationale of pursuing CT imaging to rule out cancer recurrence She is in agreement to proceed I recommend she gets that scheduled next week and I will see her back afterwards  Anemia due to antineoplastic chemotherapy The cause of her anemia is due to treatment I have ordered vitamin B12 and iron studies indicating but adequate She is not symptomatic Observe closely  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    Answer:   [facility]    Order Specific Question:   Radiology Contrast Protocol - do NOT remove file path    Answer:   [email or web address]    All questions were answered. The patient knows to call the clinic with any problems, questions or concerns. The total time spent in the appointment was 30 minutes encounter with patients including review of chart and various tests results, discussions about plan of care  and coordination of care plan   [name], MD 01/26/2021 10:59 AM  INTERVAL HISTORY: Please see below for problem oriented charting. she returns for treatment follow-up on olaparib for ovarian cancer She is doing well Denies abdominal bloating or changes in bowel habits Denies missing pills No recent infection, fever or chills  REVIEW OF SYSTEMS:   Constitutional: Denies fevers, chills or abnormal weight loss Eyes: Denies blurriness of vision Ears, nose, mouth, throat, and face: Denies mucositis or sore throat Respiratory: Denies cough, dyspnea or wheezes Cardiovascular: Denies palpitation, chest discomfort or lower extremity swelling Gastrointestinal:  Denies nausea, heartburn or change in bowel habits Skin: Denies abnormal skin rashes Lymphatics: Denies new lymphadenopathy or easy bruising Neurological:Denies numbness, tingling or new weaknesses Behavioral/Psych: Mood is stable, no new changes  All other systems were reviewed with the patient and are negative.  I have reviewed the past medical history, past surgical history, social history and family history with the patient and they are unchanged from previous Vang.  ALLERGIES:  has No Known Allergies.  MEDICATIONS:  Current Outpatient Medications  Medication Sig Dispense Refill   acetaminophen (TYLENOL) 500 MG tablet Take 500 mg by mouth every 8 (eight) hours as needed for mild pain or moderate pain.     atenolol (TENORMIN) 25 MG tablet Take 25 mg by mouth at bedtime.      atorvastatin (LIPITOR) 20 MG tablet Take 20 mg by mouth daily.     CALCIUM-MAGNESIUM-VITAMIN D PO Take 1 tablet by mouth  daily.     famotidine (PEPCID) 20 MG tablet Take 20 mg by mouth as needed for heartburn or indigestion. (Patient not taking: Reported on 04/29/2020)     lidocaine-prilocaine (EMLA) cream Apply 1 application topically as needed. 30 g 6   loratadine (CLARITIN) 10 MG tablet Take 10 mg by mouth daily as needed for allergies. (Patient not  taking: Reported on 04/29/2020)     losartan (COZAAR) 50 MG tablet Take 50 mg by mouth at bedtime.     metFORMIN (GLUCOPHAGE) 1000 MG tablet Take 500 mg by mouth 2 (two) times daily.   0   Multiple Vitamin (MULTIVITAMIN WITH MINERALS) TABS tablet Take 1 tablet by mouth daily.     olaparib ([name]) 100 MG tablet TAKE 2 TABLETS (200 MG TOTAL) BY MOUTH 2 (TWO) TIMES DAILY. SWALLOW WHOLE. 120 tablet 11   No current facility-administered medications for this visit.    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 The left adnexa measured 12.7 x 11.6 x 8.1 with low level echoes and soft tissue nodules    07/30/2017 Tumor Marker   Patient's tumor was tested for the following markers: CA-125 Results of the tumor marker test revealed 521.3   08/17/2017 Pathology Results   The malignant cells are positive for PAX-8, cytokeratin 7, estrogen receptor, and faintly positive for GATA-3. They are negative for p53, GCDFP, and cytokeratin 20. The finding are consistent with a gynecologic primary carcinoma. Additional studies can be performed upon clinician request.   08/17/2017 Procedure   Technically successful CT-guided left lower quadrant omental mass core biopsy.   08/20/2017 PET scan   1. Cystic masses arising from the pelvis. The nodular component of the RIGHT cystic mass is intensely hypermetabolic consistent with malignant ovarian neoplasm. 2. Extensive hypermetabolic peritoneal thickening in the lower abdomen and upper pelvis, upper abdomen, and upper abdominal precordial fat and paradiaphragmatic fat. 3. Retroperitoneal nodal metastasis adjacent to the IVC  at the level the kidneys. 4. No evidence of metastatic disease in the thorax other small effusion on the LEFT and nodal metastasis in the fat superior to the diaphragm. 5. Mild metabolic activity associated the distal esophagus is favored benign esophagitis.   08/20/2017 Imaging   CT chest 1. Bilateral cardiophrenic angle nodal metastasis. No additional findings to suggest metastatic disease to the chest. 2. Small left pleural effusion. 3. Peritoneal carcinomatosis noted within the abdomen. 4. Hepatic steatosis.     08/24/2017 Cancer Staging   Staging form: Ovary, Fallopian Tube, and Primary Peritoneal Carcinoma, AJCC 8th Edition - Clinical: Stage IV (cT3, cN1, cM1) - Signed by [name], MD on 08/24/2017   08/31/2017 Tumor Marker   Patient's tumor was tested for the following markers: CA-125 Results of the tumor marker test revealed 819.9   09/26/2017 Adverse Reaction   She developed reaction to Taxol, managed successfully with additional premedications   10/17/2017 Tumor Marker   Patient's tumor was tested for the following markers: CA-125 Results of the tumor marker test revealed 374.4   10/31/2017 Imaging   1. No significant change in size of the large complex bilateral adnexal masses consistent with the known history of ovarian cancer.  There is increased dependent density within the left-sided lesion. 2. Stable peritoneal carcinomatosis.  No significant ascites.  3. The bilateral pericardiac adenopathy has improved. No progressive thoracic metastatic disease. No pulmonary or osseous metastatic disease. 4. Suspicion of nonocclusive thrombus in the right internal jugular vein related to the right IJ port. Recommend further evaluation with Doppler ultrasound.   11/13/2017 Surgery   Preoperative Diagnosis: Ovarian cancer s/p neoadjuvant chemotherapy    Procedure(s) Performed:   Exploratory laparotomy  Bilateral salpingo-oophorectomy with radical tumor debulking for ovarian cancer   including retroperitoneal dissection, lysis of adhesions (enterolysis of small bowel in deep pelvis, left adnexal adhesions to sigmoid and omentum to LLQ) ~1 hour, ureterolysis. Omentectomy  Takedown of splenic flexure with removal of omental tumor in left upper quadrant    Specimens: Bilateral tubes and ovaries, omentum, splenic flexure nodule, right paracolic gutter nodule, cecal nodule, right ureteral peritoneal nodule, small bowel mesentary.    Indication for Procedure:  Patient is s/p 3 cycles of chemotherapy with response based on CA125 and decreased mediastinal disease (to <1cm).   Operative Findings:  This represented an optimal cytoreduction with gross residual disease remaining in the deep pelvis near the levator floor and possibly in the region of the right IP ligament/periappendicial region.   Upon entry a large ~20cm right tube/ovary, mostly cystic. Adherent rind along ileocecal region and appendix. Large cystic left tube/ovary ~10cm with sigmoid colon stretched across the mass and extension of the cystic lesion into the deep pelvis with residual palpable disease deep pelvis near levators. Estimate ~1cm or less on palpation, not visible disease. Omental caking in the LLQ adherent to pelvic sidewall. Omental disease noted RUQ separate. In addition ~1.5cm lesion in splenic flexure omentum. Evidence of prior diaphragmatic disease, no visible disease.       11/13/2017 Pathology Results   1. 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 - TUMOR INVOLVES THE OVARIAN SURFACE. - RIGHT FALLOPIAN TUBE IS INVOLVED. - SEE ONCOLOGY TABLE. - SEE Vang. 2. Soft tissue mass, biopsy, cecal gutter nodule - METASTATIC MUCINOUS ADENOCARCINOMA. 3. Ovary and fallopian tube, left, left ovary and fallopian tube - METASTATIC MUCINOUS ADENOCARCINOMA TO LEFT OVARY AND FALLOPIAN TUBE. 4. Omentum, resection for tumor - METASTATIC MUCINOUS ADENOCARCINOMA TO  OMENTUM. 5. Soft tissue mass, biopsy, splenic flexure nodule - METASTATIC MUCINOUS ADENOCARCINOMA. 6. Soft tissue mass, biopsy, cecal implant - METASTATIC MUCINOUS ADENOCARCINOMA. 7. Soft tissue mass, biopsy, right ureteral peritoneal implant - METASTATIC MUCINOUS ADENOCARCINOMA. 8. Mesentery, small bowel nodule - METASTATIC MUCINOUS ADENOCARCINOMA. Microscopic Comment 1. OVARY or FALLOPIAN TUBE or PRIMARY PERITONEUM: Procedure: Bilateral salpingo-oophorectomy. Specimen Integrity: Intact. Tumor Site: Right ovary. Ovarian Surface Involvement: Present. Fallopian Tube Surface Involvement: Present. Tumor Size: 20 cm. Histologic Type: Mucinous adenocarcinoma. Histologic Grade: Overall G2 (moderately differentiated) (focal areas of poor differentiation are present). Implants: Present. Other Tissue/ Organ Involvement: Left ovary, left fallopian tube and omentum. Largest Extrapelvic Peritoneal Focus: less than 2 cm. See Vang Peritoneal/Ascitic Fluid: N/A Treatment Effect: No definite or minimal response identified (chemotherapy response score 1 [CRS 1]) Regional Lymph Nodes No lymph nodes submitted or found Number of Lymph Nodes Examined: 0 Pathologic Stage Classification (pTNM, AJCC 8th Edition): ypT3b, ypNX. Representative Tumor Block: 1D and 1E. (NDK:gt, 11/15/17)   11/19/2017 Genetic Testing   Negative genetic testing on the [facility] Myrisk panel.  The [facility] gene panel offered by [employer] includes sequencing and deletion/duplication testing of the following 35 genes: APC, ATM, AXIN2,  BARD1, BMPR1A, BRCA1, BRCA2, BRIP1, CHD1, CDK4, CDKN2A, CHEK2, EPCAM (large rearrangement only), HOXB13, (sequencing only), GALNT12, MLH1, MSH2, MSH3 (excluding repetitive portions of exon 1), MSH6, MUTYH, NBN, NTHL1, PALB2, PMS2, PTEN, RAD51C, RAD51D, RNF43, RPS20, SMAD4, STK11, and TP53. Sequencing was performed for select regions of POLE and POLD1, and large rearrangement analysis was  performed for select regions of GREM1. The report date is November 19, 2017.  HRD tumor results indicate a BRCA1 mutation identified in the ovarian tumor causing genomic instability. The report date is 12/04/2017     Genetic Testing   Patient has genetic testing done for ER/PR and Her2/neu. Results revealed patient has the following: ER 15-20% PR 5-10% Her2/neu - negative    Genetic Testing   Patient has genetic testing done for MSI/MMR. Results revealed patient has the following mutation(s): MMR normal, MSI stable   12/10/2017 Imaging   1. Interval resection of large bilateral adnexal cystic masses. Residual soft tissue/tumor within bilateral adnexal regions as above. 2. Interval resolution of previously noted omental cake which may reflect interval omentectomy. There is a new peritoneal deposit identified within the left upper quadrant of the abdomen involving the anterior surface of the spleen.   12/10/2017 Tumor Marker   Patient's tumor was tested for the following markers: CA-125 Results of the tumor marker test revealed 94.9   12/11/2017 - 02/20/2018 Chemotherapy   The patient had FOLFOX regimen for mucinous   01/09/2018 Tumor Marker   Patient's tumor was tested for the following markers: CA-125 Results of the tumor marker test revealed 37   01/31/2018 Imaging   Resolution of peritoneal soft tissue density along the anterior margin of the spleen since prior study. No evidence of residual or progressive metastatic disease.  Mild sigmoid diverticulosis, without radiographic evidence of diverticulitis. Resolution of small pericolonic abscess along the left lateral pelvic sidewall.  Mild hepatic steatosis.   02/06/2018 Tumor Marker   Patient's tumor was tested for the following markers: CA-125 Results of the tumor marker test revealed 36.7   03/06/2018 -  Chemotherapy   The patient is started on [name] as PARP maintenance   03/13/2018 Tumor Marker   Patient's tumor was  tested for the following markers: CA-125 Results of the tumor marker test revealed 33.8   04/05/2018 Tumor Marker   Patient's tumor was tested for the following markers: CA-125 Results of the tumor marker test revealed 27.3   05/03/2018 Tumor Marker   Patient's tumor was tested for the following markers: CA-125 Results of the tumor marker test revealed 23.5   07/08/2018 Tumor Marker   Patient's tumor was tested for the following markers: CA-125 Results of the tumor marker test revealed 19.9   09/02/2018 Tumor Marker   Patient's tumor was tested for the following markers: CA-125 Results of the tumor marker test revealed 17   10/28/2018 Imaging   1. 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 Repeat abdominal MRI with and without IV gadolinium with MRCP is recommended in 2 years to ensure continued stability. This recommendation follows ACR consensus guidelines: Management of Incidental Pancreatic Cysts: A White Paper of the ACR Incidental Findings Committee. [place] 9604;54:[id number]. 2. Hepatic steatosis. 3. Additional incidental findings, as above.     10/28/2018 Tumor Marker   Patient's tumor was tested for the following markers: CA-125 Results of the tumor marker test revealed 16.9   03/03/2019 Tumor  Marker   Patient's tumor was tested for the following markers: CA-125 Results of the tumor marker test revealed 12.7.   07/09/2019 Tumor Marker   Patient's tumor was tested for the following markers: CA-125 Results of the tumor marker test revealed 9.5.   09/02/2019 Tumor Marker   Patient's tumor was tested for the following markers: CA-125 Results of the tumor marker test revealed 11.   10/28/2019 Tumor Marker   Patient's tumor was tested for the following markers: CA-125 Results of the tumor marker test revealed 10.2   12/23/2019 Tumor Marker    Patient's tumor was tested for the following markers: CA-125 Results of the tumor marker test revealed 12.1   02/17/2020 Tumor Marker   Patient's tumor was tested for the following markers: CA-125 Results of the tumor marker test revealed 9.5   05/25/2020 Tumor Marker   Patient's tumor was tested for the following markers: CA-125 Results of the tumor marker test revealed 9.6   07/13/2020 Tumor Marker   Patient's tumor was tested for the following markers: CA-125 Results of the tumor marker test revealed 10.   11/01/2020 Tumor Marker   Patient's tumor was tested for the following markers: CA-125. Results of the tumor marker test revealed 18.1.   11/01/2020 Imaging   1. Cystic pancreatic lesions, potentially small intraductal papillary mucinous neoplasm. Potential slight increase in size of 1 of these areas in particular near the tail of the pancreas with perhaps mild increase in ventral duct dilation but no main pancreatic duct dilation. Given patient history consider six-month follow-up to assess for stability. 7 mm as the largest area with potential increase in size from 3 mm. 2. Hepatic steatosis. 3. Postoperative changes in the midline of the abdomen.   01/25/2021 Tumor Marker   Patient's tumor was tested for the following markers: CA-125. Results of the tumor marker test revealed 220.     PHYSICAL EXAMINATION: ECOG PERFORMANCE STATUS: 0 - Asymptomatic  Vitals:   01/25/21 0924  BP: 128/62  Pulse: 67  Resp: 18  Temp: (!) 97.4 F (36.3 C)  SpO2: 100%   Filed Weights   01/25/21 0924  Weight: 151 lb 12.8 oz (68.9 kg)    GENERAL:alert, no distress and comfortable SKIN: skin color, texture, turgor are normal, no rashes or significant lesions EYES: normal, Conjunctiva are pink and non-injected, sclera clear OROPHARYNX:no exudate, no erythema and lips, buccal mucosa, and tongue normal  NECK: supple, thyroid normal size, non-tender, without nodularity LYMPH:  no palpable  lymphadenopathy in the cervical, axillary or inguinal LUNGS: clear to auscultation and percussion with normal breathing effort HEART: regular rate & rhythm and no murmurs and no lower extremity edema ABDOMEN:abdomen soft, non-tender and normal bowel sounds Musculoskeletal:no cyanosis of digits and no clubbing  NEURO: alert & oriented x 3 with fluent speech, no focal motor/sensory deficits  LABORATORY DATA:  I have reviewed the data as listed    Component Value Date/Time   NA 141 01/25/2021 0901   K 4.3 01/25/2021 0901   CL 107 01/25/2021 0901   CO2 24 01/25/2021 0901   GLUCOSE 154 (H) 01/25/2021 0901   BUN 16 01/25/2021 0901   CREATININE 1.08 (H) 01/25/2021 0901   CREATININE 0.84 04/29/2019 0915   CREATININE 0.86 06/05/2014 0957   CALCIUM 9.4 01/25/2021 0901   PROT 7.2 01/25/2021 0901   ALBUMIN 3.9 01/25/2021 0901   AST 22 01/25/2021 0901   AST 22 04/29/2019 0915   ALT 15 01/25/2021 0901   ALT 18 04/29/2019  0915   ALKPHOS 58 01/25/2021 0901   BILITOT 0.6 01/25/2021 0901   BILITOT 0.6 04/29/2019 0915   GFRNONAA 55 (L) 01/25/2021 0901   GFRNONAA >60 04/29/2019 0915   GFRAA >60 12/23/2019 0954   GFRAA >60 04/29/2019 0915    No results found for: SPEP, UPEP  Lab Results  Component Value Date   WBC 6.5 01/25/2021   NEUTROABS 4.3 01/25/2021   HGB 11.8 (L) 01/25/2021   HCT 35.2 (L) 01/25/2021   MCV 109.3 (H) 01/25/2021   PLT 173 01/25/2021      Chemistry      Component Value Date/Time   NA 141 01/25/2021 0901   K 4.3 01/25/2021 0901   CL 107 01/25/2021 0901   CO2 24 01/25/2021 0901   BUN 16 01/25/2021 0901   CREATININE 1.08 (H) 01/25/2021 0901   CREATININE 0.84 04/29/2019 0915   CREATININE 0.86 06/05/2014 0957      Component Value Date/Time   CALCIUM 9.4 01/25/2021 0901   ALKPHOS 58 01/25/2021 0901   AST 22 01/25/2021 0901   AST 22 04/29/2019 0915   ALT 15 01/25/2021 0901   ALT 18 04/29/2019 0915   BILITOT 0.6 01/25/2021 0901   BILITOT 0.6 04/29/2019 0915

## 2021-01-26 NOTE — Telephone Encounter (Signed)
Called Jeanene and scheduled appointment on 02/04/21 at 1:00 to review CT results.

## 2021-01-26 NOTE — Assessment & Plan Note (Signed)
She has abnormal pancreatic cyst noted on previous MRI I do not believe this is the cause of her elevated CA125 She will be due for MRI imaging next year but given elevated tumor marker, we will order CT imaging and will evaluate the pancreatic cyst as well

## 2021-01-26 NOTE — Assessment & Plan Note (Signed)
Clinically, she is not symptomatic However, the patient is at high risk of cancer relapse She is still taking olaparib, without major side effects except for mild anemia At the time of dictation, her CA125 came back markedly elevated I explained to the patient the rationale of pursuing CT imaging to rule out cancer recurrence She is in agreement to proceed I recommend she gets that scheduled next week and I will see her back afterwards

## 2021-01-26 NOTE — Assessment & Plan Note (Signed)
The cause of her anemia is due to treatment I have ordered vitamin B12 and iron studies indicating but adequate She is not symptomatic Observe closely

## 2021-01-27 ENCOUNTER — Telehealth: Payer: Self-pay | Admitting: Oncology

## 2021-01-27 NOTE — Telephone Encounter (Signed)
Called Matelyn back and advised her of message from Dr. Alvy Bimler.  She verbalized understanding and agreement.

## 2021-01-27 NOTE — Telephone Encounter (Signed)
I do not believe switching brand if the cause I suggest we keep appt as scheduled

## 2021-01-27 NOTE — Telephone Encounter (Signed)
Sabrina Vang called and is worried about her CA-125 going up so suddenly.  She said she recently had to switch from the [employer] to another source for her [name] because her grant ended.  She is wondering if something was different with the formulation.  She has never missed a dose so she is wondering if that could be the reason.

## 2021-02-02 ENCOUNTER — Other Ambulatory Visit: Payer: Self-pay

## 2021-02-02 ENCOUNTER — Ambulatory Visit (HOSPITAL_COMMUNITY)
Admission: RE | Admit: 2021-02-02 | Discharge: 2021-02-02 | Disposition: A | Discharge status: Home / Self Care | Payer: Medicare HMO | Source: Ambulatory Visit | Attending: Hematology and Oncology | Admitting: Hematology and Oncology

## 2021-02-02 DIAGNOSIS — C561 Malignant neoplasm of right ovary: Secondary | ICD-10-CM | POA: Insufficient documentation

## 2021-02-02 MED ORDER — IOHEXOL 300 MG/ML  SOLN
100.0000 mL | Freq: Once | INTRAMUSCULAR | Status: AC | PRN
  Administered 2021-02-02: 100 mL via INTRAVENOUS

## 2021-02-04 ENCOUNTER — Telehealth: Payer: Self-pay

## 2021-02-04 ENCOUNTER — Other Ambulatory Visit: Payer: Self-pay

## 2021-02-04 ENCOUNTER — Inpatient Hospital Stay (HOSPITAL_BASED_OUTPATIENT_CLINIC_OR_DEPARTMENT_OTHER): Payer: Medicare HMO | Admitting: Hematology and Oncology

## 2021-02-04 ENCOUNTER — Encounter: Payer: Self-pay | Admitting: Hematology and Oncology

## 2021-02-04 VITALS — BP 133/57 | HR 68 | Resp 18 | Ht 65.0 in | Wt 153.6 lb

## 2021-02-04 DIAGNOSIS — R590 Localized enlarged lymph nodes: Secondary | ICD-10-CM | POA: Diagnosis not present

## 2021-02-04 DIAGNOSIS — I10 Essential (primary) hypertension: Secondary | ICD-10-CM | POA: Diagnosis not present

## 2021-02-04 DIAGNOSIS — Z7189 Other specified counseling: Secondary | ICD-10-CM | POA: Diagnosis not present

## 2021-02-04 DIAGNOSIS — C561 Malignant neoplasm of right ovary: Secondary | ICD-10-CM | POA: Diagnosis not present

## 2021-02-04 DIAGNOSIS — Z5112 Encounter for antineoplastic immunotherapy: Secondary | ICD-10-CM | POA: Diagnosis not present

## 2021-02-04 MED ORDER — ONDANSETRON HCL 8 MG PO TABS: 8.0000 mg | ORAL_TABLET | Freq: Three times a day (TID) | ORAL | 1 refills | Status: DC | PRN

## 2021-02-04 MED ORDER — LIDOCAINE-PRILOCAINE 2.5-2.5 % EX CREA: TOPICAL_CREAM | CUTANEOUS | 3 refills | Status: DC

## 2021-02-04 MED ORDER — PROCHLORPERAZINE MALEATE 10 MG PO TABS: 10.0000 mg | ORAL_TABLET | Freq: Four times a day (QID) | ORAL | 1 refills | Status: DC | PRN

## 2021-02-04 NOTE — Assessment & Plan Note (Signed)
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

## 2021-02-04 NOTE — Progress Notes (Signed)
DISCONTINUE OFF PATHWAY REGIMEN - Ovarian   [id number] (q14d):   A cycle is every 14 days:     Oxaliplatin      Leucovorin      5-Fluorouracil      5-Fluorouracil   **Always confirm dose/schedule in your pharmacy ordering system**  REASON: Disease Progression PRIOR TREATMENT: Off Pathway: FOLFOX (q14d) TREATMENT RESPONSE: Complete Response (CR)  START OFF PATHWAY REGIMEN - Ovarian   [id number] + Bevacizumab (Leucovorin IV D1 + Fluorouracil IV D1/CIV D1,2 + Oxaliplatin IV D1 + Bevacizumab IV D1) q14 Days:   A cycle is every 14 days:     Bevacizumab-xxxx      Oxaliplatin      Leucovorin      Fluorouracil      Fluorouracil   **Always confirm dose/schedule in your pharmacy ordering system**  Patient Characteristics: Recurrent or Progressive Disease, Second Line, Platinum Sensitive and ? 6 Months Since Last Therapy, Not a Candidate for Secondary Debulking Surgery BRCA Mutation Status: Present (Somatic) Therapeutic Status: Recurrent or Progressive Disease Line of Therapy: Second Line  Intent of Therapy: Non-Curative / Palliative Intent, Discussed with Patient

## 2021-02-04 NOTE — Progress Notes (Signed)
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, namely mucinous carcinoma, I favor using FOLFOX chemotherapy with bevacizumab instead of recommending carboplatin with paclitaxel The risk, benefits, side effects of this treatment is fully explained and she is in agreement to proceed Tentatively, I will get her scheduled to start treatment on October 24 I recommend minimum 2 to 3 months of treatment before repeating imaging study We will continue to follow-up with her tumor marker  Goals of care, counseling/discussion We have discussions about goals of care In the future, once we have achieved complete response, she will go on maintenance treatment with bevacizumab  Essential hypertension The patient needs to check her blood pressure twice daily while on treatment  Orders Placed This Encounter  Procedures   CBC with Differential ([place] Only)    Standing Status:   Standing    Number of Occurrences:   20    Standing Expiration Date:   02/04/2022   CMP ([place] only)    Standing Status:   Standing    Number of Occurrences:   20    Standing Expiration Date:   02/04/2022   Total Protein, Urine dipstick    Standing Status:   Standing    Number of Occurrences:   20    Standing Expiration Date:   02/04/2022    All questions were answered. The patient knows to call the clinic with any problems, questions or concerns. The total time spent in the appointment was 40 minutes encounter with patients including review of chart and various tests results, discussions about plan of care and coordination of care  plan   [name], MD 02/04/2021 2:11 PM  INTERVAL HISTORY: Please see below for problem oriented charting. she returns for follow-up to review test results of her recent imaging studies She is doing well and remained asymptomatic She is here accompanied by her husband  REVIEW OF SYSTEMS:   Constitutional: Denies fevers, chills or abnormal weight loss Eyes: Denies blurriness of vision Ears, nose, mouth, throat, and face: Denies mucositis or sore throat Respiratory: Denies cough, dyspnea or wheezes Cardiovascular: Denies palpitation, chest discomfort or lower extremity swelling Gastrointestinal:  Denies nausea, heartburn or change in bowel habits Skin: Denies abnormal skin rashes Lymphatics: Denies new lymphadenopathy or easy bruising Neurological:Denies numbness, tingling or new weaknesses Behavioral/Psych: Mood is stable, no new changes  All other systems were reviewed with the patient and are negative.  I have reviewed the past medical history, past surgical history, social history and family history with the patient and they are unchanged from previous note.  ALLERGIES:  has No Known Allergies.  MEDICATIONS:  Current Outpatient Medications  Medication Sig Dispense Refill   acetaminophen (TYLENOL) 500 MG tablet Take 500 mg by mouth every 8 (eight) hours as needed for mild pain or moderate pain.     atenolol (TENORMIN) 25 MG tablet Take 25 mg by mouth at bedtime.      atorvastatin (LIPITOR) 20 MG tablet Take 20 mg by mouth daily.     CALCIUM-MAGNESIUM-VITAMIN D PO Take 1 tablet by mouth daily.     famotidine (PEPCID) 20 MG tablet Take  20 mg by mouth as needed for heartburn or indigestion. (Patient not taking: Reported on 04/29/2020)     lidocaine-prilocaine (EMLA) cream Apply 1 application topically as needed. 30 g 6   lidocaine-prilocaine (EMLA) cream Apply to affected area once 30 g 3   loratadine (CLARITIN) 10 MG tablet Take 10 mg by mouth daily as needed for allergies.  (Patient not taking: Reported on 04/29/2020)     losartan (COZAAR) 50 MG tablet Take 50 mg by mouth at bedtime.     metFORMIN (GLUCOPHAGE) 1000 MG tablet Take 500 mg by mouth 2 (two) times daily.   0   Multiple Vitamin (MULTIVITAMIN WITH MINERALS) TABS tablet Take 1 tablet by mouth daily.     ondansetron (ZOFRAN) 8 MG tablet Take 1 tablet (8 mg total) by mouth every 8 (eight) hours as needed for refractory nausea / vomiting. 30 tablet 1   prochlorperazine (COMPAZINE) 10 MG tablet Take 1 tablet (10 mg total) by mouth every 6 (six) hours as needed (Nausea or vomiting). 30 tablet 1   No current facility-administered medications for this visit.    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 The left adnexa measured 12.7 x 11.6 x 8.1 with low level echoes and soft tissue nodules    07/30/2017 Tumor Marker   Patient's tumor was tested for the following markers: CA-125 Results of the tumor marker test revealed 521.3   08/17/2017 Pathology Results   The malignant cells are positive for PAX-8, cytokeratin 7, estrogen receptor, and faintly positive for GATA-3. They are negative for p53, GCDFP, and cytokeratin 20. The finding are consistent with a gynecologic primary carcinoma. Additional studies can be performed upon clinician request.   08/17/2017 Procedure   Technically successful CT-guided left lower quadrant omental mass core biopsy.   08/20/2017 PET scan   1. Cystic masses arising from the pelvis. The nodular component of the RIGHT cystic mass is intensely hypermetabolic consistent with malignant ovarian neoplasm. 2. Extensive hypermetabolic peritoneal  thickening in the lower abdomen and upper pelvis, upper abdomen, and upper abdominal precordial fat and paradiaphragmatic fat. 3. Retroperitoneal nodal metastasis adjacent to the IVC at the level the kidneys. 4. No evidence of metastatic disease in the thorax other small effusion on the LEFT and nodal metastasis in the fat superior to the diaphragm. 5. Mild metabolic activity associated the distal esophagus is favored benign esophagitis.   08/20/2017 Imaging   CT chest 1. Bilateral cardiophrenic angle nodal metastasis. No additional findings to suggest metastatic disease to the chest. 2. Small left pleural effusion. 3. Peritoneal carcinomatosis noted within the abdomen. 4. Hepatic steatosis.     08/24/2017 Cancer Staging   Staging form: Ovary, Fallopian Tube, and Primary Peritoneal Carcinoma, AJCC 8th Edition - Clinical: Stage IV (cT3, cN1, cM1) - Signed by [name], MD on 08/24/2017   08/31/2017 Tumor Marker   Patient's tumor was tested for the following markers: CA-125 Results of the tumor marker test revealed 819.9   09/26/2017 Adverse Reaction   She developed reaction to Taxol, managed successfully with additional premedications   10/17/2017 Tumor Marker   Patient's tumor was tested for the following markers: CA-125 Results  of the tumor marker test revealed 374.4   10/31/2017 Imaging   1. No significant change in size of the large complex bilateral adnexal masses consistent with the known history of ovarian cancer. There is increased dependent density within the left-sided lesion. 2. Stable peritoneal carcinomatosis.  No significant ascites.  3. The bilateral pericardiac adenopathy has improved. No progressive thoracic metastatic disease. No pulmonary or osseous metastatic disease. 4. Suspicion of nonocclusive thrombus in the right internal jugular vein related to the right IJ port. Recommend further evaluation with Doppler ultrasound.   11/13/2017 Surgery   Preoperative Diagnosis:  Ovarian cancer s/p neoadjuvant chemotherapy    Procedure(s) Performed:   Exploratory laparotomy  Bilateral salpingo-oophorectomy with radical tumor debulking for ovarian cancer  including retroperitoneal dissection, lysis of adhesions (enterolysis of small bowel in deep pelvis, left adnexal adhesions to sigmoid and omentum to LLQ) ~1 hour, ureterolysis. Omentectomy  Takedown of splenic flexure with removal of omental tumor in left upper quadrant    Specimens: Bilateral tubes and ovaries, omentum, splenic flexure nodule, right paracolic gutter nodule, cecal nodule, right ureteral peritoneal nodule, small bowel mesentary.    Indication for Procedure:  Patient is s/p 3 cycles of chemotherapy with response based on CA125 and decreased mediastinal disease (to <1cm).   Operative Findings:  This represented an optimal cytoreduction with gross residual disease remaining in the deep pelvis near the levator floor and possibly in the region of the right IP ligament/periappendicial region.   Upon entry a large ~20cm right tube/ovary, mostly cystic. Adherent rind along ileocecal region and appendix. Large cystic left tube/ovary ~10cm with sigmoid colon stretched across the mass and extension of the cystic lesion into the deep pelvis with residual palpable disease deep pelvis near levators. Estimate ~1cm or less on palpation, not visible disease. Omental caking in the LLQ adherent to pelvic sidewall. Omental disease noted RUQ separate. In addition ~1.5cm lesion in splenic flexure omentum. Evidence of prior diaphragmatic disease, no visible disease.       11/13/2017 Pathology Results   1. 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 - TUMOR INVOLVES THE OVARIAN SURFACE. - RIGHT FALLOPIAN TUBE IS INVOLVED. - SEE ONCOLOGY TABLE. - SEE NOTE. 2. Soft tissue mass, biopsy, cecal gutter nodule - METASTATIC MUCINOUS ADENOCARCINOMA. 3. Ovary and fallopian tube,  left, left ovary and fallopian tube - METASTATIC MUCINOUS ADENOCARCINOMA TO LEFT OVARY AND FALLOPIAN TUBE. 4. Omentum, resection for tumor - METASTATIC MUCINOUS ADENOCARCINOMA TO OMENTUM. 5. Soft tissue mass, biopsy, splenic flexure nodule - METASTATIC MUCINOUS ADENOCARCINOMA. 6. Soft tissue mass, biopsy, cecal implant - METASTATIC MUCINOUS ADENOCARCINOMA. 7. Soft tissue mass, biopsy, right ureteral peritoneal implant - METASTATIC MUCINOUS ADENOCARCINOMA. 8. Mesentery, small bowel nodule - METASTATIC MUCINOUS ADENOCARCINOMA. Microscopic Comment 1. OVARY or FALLOPIAN TUBE or PRIMARY PERITONEUM: Procedure: Bilateral salpingo-oophorectomy. Specimen Integrity: Intact. Tumor Site: Right ovary. Ovarian Surface Involvement: Present. Fallopian Tube Surface Involvement: Present. Tumor Size: 20 cm. Histologic Type: Mucinous adenocarcinoma. Histologic Grade: Overall G2 (moderately differentiated) (focal areas of poor differentiation are present). Implants: Present. Other Tissue/ Organ Involvement: Left ovary, left fallopian tube and omentum. Largest Extrapelvic Peritoneal Focus: less than 2 cm. See note Peritoneal/Ascitic Fluid: N/A Treatment Effect: No definite or minimal response identified (chemotherapy response score 1 [CRS 1]) Regional Lymph Nodes No lymph nodes submitted or found Number of Lymph Nodes Examined: 0 Pathologic Stage Classification (pTNM, AJCC 8th Edition): ypT3b, ypNX. Representative Tumor Block: 1D and 1E. (NDK:gt, 11/15/17)   11/19/2017 Genetic  Testing   Negative genetic testing on the [facility] panel.  The [facility] gene panel offered by [employer] includes sequencing and deletion/duplication testing of the following 35 genes: APC, ATM, AXIN2, BARD1, BMPR1A, BRCA1, BRCA2, BRIP1, CHD1, CDK4, CDKN2A, CHEK2, EPCAM (large rearrangement only), HOXB13, (sequencing only), GALNT12, MLH1, MSH2, MSH3 (excluding repetitive portions of exon 1), MSH6, MUTYH, NBN,  NTHL1, PALB2, PMS2, PTEN, RAD51C, RAD51D, RNF43, RPS20, SMAD4, STK11, and TP53. Sequencing was performed for select regions of POLE and POLD1, and large rearrangement analysis was performed for select regions of GREM1. The report date is November 19, 2017.  HRD tumor results indicate a BRCA1 mutation identified in the ovarian tumor causing genomic instability. The report date is 12/04/2017     Genetic Testing   Patient has genetic testing done for ER/PR and Her2/neu. Results revealed patient has the following: ER 15-20% PR 5-10% Her2/neu - negative    Genetic Testing   Patient has genetic testing done for MSI/MMR. Results revealed patient has the following mutation(s): MMR normal, MSI stable   12/10/2017 Imaging   1. Interval resection of large bilateral adnexal cystic masses. Residual soft tissue/tumor within bilateral adnexal regions as above. 2. Interval resolution of previously noted omental cake which may reflect interval omentectomy. There is a new peritoneal deposit identified within the left upper quadrant of the abdomen involving the anterior surface of the spleen.   12/10/2017 Tumor Marker   Patient's tumor was tested for the following markers: CA-125 Results of the tumor marker test revealed 94.9   12/11/2017 - 02/20/2018 Chemotherapy   The patient had FOLFOX regimen for mucinous   01/09/2018 Tumor Marker   Patient's tumor was tested for the following markers: CA-125 Results of the tumor marker test revealed 37   01/31/2018 Imaging   Resolution of peritoneal soft tissue density along the anterior margin of the spleen since prior study. No evidence of residual or progressive metastatic disease.  Mild sigmoid diverticulosis, without radiographic evidence of diverticulitis. Resolution of small pericolonic abscess along the left lateral pelvic sidewall.  Mild hepatic steatosis.   02/06/2018 Tumor Marker   Patient's tumor was tested for the following markers: CA-125 Results of  the tumor marker test revealed 36.7   03/06/2018 -  Chemotherapy   The patient is started on [name] as PARP maintenance   03/13/2018 Tumor Marker   Patient's tumor was tested for the following markers: CA-125 Results of the tumor marker test revealed 33.8   04/05/2018 Tumor Marker   Patient's tumor was tested for the following markers: CA-125 Results of the tumor marker test revealed 27.3   05/03/2018 Tumor Marker   Patient's tumor was tested for the following markers: CA-125 Results of the tumor marker test revealed 23.5   07/08/2018 Tumor Marker   Patient's tumor was tested for the following markers: CA-125 Results of the tumor marker test revealed 19.9   09/02/2018 Tumor Marker   Patient's tumor was tested for the following markers: CA-125 Results of the tumor marker test revealed 17   10/28/2018 Imaging   1. 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 Repeat abdominal MRI with and without IV gadolinium with MRCP is recommended in 2 years to ensure continued stability. This recommendation follows ACR consensus guidelines: Management of Incidental Pancreatic Cysts: A White Paper of the ACR Incidental Findings Committee. [place] 2952;84:[id number]. 2. Hepatic steatosis. 3. Additional  incidental findings, as above.     10/28/2018 Tumor Marker   Patient's tumor was tested for the following markers: CA-125 Results of the tumor marker test revealed 16.9   03/03/2019 Tumor Marker   Patient's tumor was tested for the following markers: CA-125 Results of the tumor marker test revealed 12.7.   07/09/2019 Tumor Marker   Patient's tumor was tested for the following markers: CA-125 Results of the tumor marker test revealed 9.5.   09/02/2019 Tumor Marker   Patient's tumor was tested for the following markers: CA-125 Results of the tumor marker test  revealed 11.   10/28/2019 Tumor Marker   Patient's tumor was tested for the following markers: CA-125 Results of the tumor marker test revealed 10.2   12/23/2019 Tumor Marker   Patient's tumor was tested for the following markers: CA-125 Results of the tumor marker test revealed 12.1   02/17/2020 Tumor Marker   Patient's tumor was tested for the following markers: CA-125 Results of the tumor marker test revealed 9.5   05/25/2020 Tumor Marker   Patient's tumor was tested for the following markers: CA-125 Results of the tumor marker test revealed 9.6   07/13/2020 Tumor Marker   Patient's tumor was tested for the following markers: CA-125 Results of the tumor marker test revealed 10.   11/01/2020 Tumor Marker   Patient's tumor was tested for the following markers: CA-125. Results of the tumor marker test revealed 18.1.   11/01/2020 Imaging   1. Cystic pancreatic lesions, potentially small intraductal papillary mucinous neoplasm. Potential slight increase in size of 1 of these areas in particular near the tail of the pancreas with perhaps mild increase in ventral duct dilation but no main pancreatic duct dilation. Given patient history consider six-month follow-up to assess for stability. 7 mm as the largest area with potential increase in size from 3 mm. 2. Hepatic steatosis. 3. Postoperative changes in the midline of the abdomen.   01/25/2021 Tumor Marker   Patient's tumor was tested for the following markers: CA-125. Results of the tumor marker test revealed 220.   02/03/2021 Imaging   Increased peritoneal soft tissue thickening and nodularity within the lower abdomen and pelvis, with minimal ascites. This is consistent with progressive peritoneal carcinomatosis.   New small ventral abdominal wall hernias.   Colonic diverticulosis. No radiographic evidence of diverticulitis.   Aortic Atherosclerosis (ICD10-I70.0).   02/21/2021 -  Chemotherapy   Patient is on Treatment Plan :  Ovarian FOLFOX + Bevacizumab q14d       PHYSICAL EXAMINATION: ECOG PERFORMANCE STATUS: 0 - Asymptomatic  Vitals:   02/04/21 1310  BP: (!) 133/57  Pulse: 68  Resp: 18  SpO2: 100%   Filed Weights   02/04/21 1310  Weight: 153 lb 9.6 oz (69.7 kg)    GENERAL:alert, no distress and comfortable SKIN: skin color, texture, turgor are normal, no rashes or significant lesions EYES: normal, Conjunctiva are pink and non-injected, sclera clear OROPHARYNX:no exudate, no erythema and lips, buccal mucosa, and tongue normal  NECK: supple, thyroid normal size, non-tender, without nodularity LYMPH:  no palpable lymphadenopathy in the cervical, axillary or inguinal LUNGS: clear to auscultation and percussion with normal breathing effort HEART: regular rate & rhythm and no murmurs and no lower extremity edema ABDOMEN:abdomen soft, non-tender and normal bowel sounds Musculoskeletal:no cyanosis of digits and no clubbing  NEURO: alert & oriented x 3 with fluent speech, no focal motor/sensory deficits  LABORATORY DATA:  I have reviewed the data as listed  Component Value Date/Time   NA 141 01/25/2021 0901   K 4.3 01/25/2021 0901   CL 107 01/25/2021 0901   CO2 24 01/25/2021 0901   GLUCOSE 154 (H) 01/25/2021 0901   BUN 16 01/25/2021 0901   CREATININE 1.08 (H) 01/25/2021 0901   CREATININE 0.84 04/29/2019 0915   CREATININE 0.86 06/05/2014 0957   CALCIUM 9.4 01/25/2021 0901   PROT 7.2 01/25/2021 0901   ALBUMIN 3.9 01/25/2021 0901   AST 22 01/25/2021 0901   AST 22 04/29/2019 0915   ALT 15 01/25/2021 0901   ALT 18 04/29/2019 0915   ALKPHOS 58 01/25/2021 0901   BILITOT 0.6 01/25/2021 0901   BILITOT 0.6 04/29/2019 0915   GFRNONAA 55 (L) 01/25/2021 0901   GFRNONAA >60 04/29/2019 0915   GFRAA >60 12/23/2019 0954   GFRAA >60 04/29/2019 0915    No results found for: SPEP, UPEP  Lab Results  Component Value Date   WBC 6.5 01/25/2021   NEUTROABS 4.3 01/25/2021   HGB 11.8 (L) 01/25/2021    HCT 35.2 (L) 01/25/2021   MCV 109.3 (H) 01/25/2021   PLT 173 01/25/2021      Chemistry      Component Value Date/Time   NA 141 01/25/2021 0901   K 4.3 01/25/2021 0901   CL 107 01/25/2021 0901   CO2 24 01/25/2021 0901   BUN 16 01/25/2021 0901   CREATININE 1.08 (H) 01/25/2021 0901   CREATININE 0.84 04/29/2019 0915   CREATININE 0.86 06/05/2014 0957      Component Value Date/Time   CALCIUM 9.4 01/25/2021 0901   ALKPHOS 58 01/25/2021 0901   AST 22 01/25/2021 0901   AST 22 04/29/2019 0915   ALT 15 01/25/2021 0901   ALT 18 04/29/2019 0915   BILITOT 0.6 01/25/2021 0901   BILITOT 0.6 04/29/2019 0915       RADIOGRAPHIC STUDIES: I have reviewed multiple imaging studies with the patient and her husband I have personally reviewed the radiological images as listed and agreed with the findings in the report. CT ABDOMEN PELVIS W CONTRAST  Result Date: 02/03/2021 CLINICAL DATA:  Follow-up ovarian carcinoma. Ongoing oral chemotherapy. EXAM: CT ABDOMEN AND PELVIS WITH CONTRAST TECHNIQUE: Multidetector CT imaging of the abdomen and pelvis was performed using the standard protocol following bolus administration of intravenous contrast. CONTRAST:  15m OMNIPAQUE IOHEXOL 300 MG/ML  SOLN COMPARISON:  01/31/2018 FINDINGS: Lower Chest: No acute findings. Hepatobiliary: No hepatic masses identified. Prior cholecystectomy. No evidence of biliary obstruction. Pancreas:  No mass or inflammatory changes. Spleen: Within normal limits in size and appearance. Adrenals/Urinary Tract: No masses identified. Stable small right renal cyst. No evidence of ureteral calculi or hydronephrosis. Stomach/Bowel: No evidence of obstruction, inflammatory process or abnormal fluid collections. Diverticulosis is seen mainly involving the descending and sigmoid colon, however there is no evidence of diverticulitis. Vascular/Lymphatic: No pathologically enlarged lymph nodes. No acute vascular findings. Aortic atherosclerotic  calcification noted. Reproductive: Prior hysterectomy again noted. Increased soft tissue thickening and nodularity is seen within the small bowel mesentery and along the peritoneal surfaces in the lower abdomen and pelvis since prior study. An index nodule in the left lower quadrant measures 12 x 12 mm on image 57/2, and is new since prior study. Another new nodule in the right lower quadrant mesentery measures 2.2 x 2.0 cm on image 58/2. Small amount of ascites is noted within the pelvis which is also new. Other: 3 small midline ventral abdominal wall hernias are seen which are new since previous  study, 2 containing loops of small bowel. No evidence of small bowel obstruction or strangulation. Musculoskeletal:  No suspicious bone lesions identified. IMPRESSION: Increased peritoneal soft tissue thickening and nodularity within the lower abdomen and pelvis, with minimal ascites. This is consistent with progressive peritoneal carcinomatosis. New small ventral abdominal wall hernias. Colonic diverticulosis. No radiographic evidence of diverticulitis. Aortic Atherosclerosis (ICD10-I70.0). Electronically Signed   By: Marlaine Hind M.D.   On: 02/03/2021 17:35

## 2021-02-04 NOTE — Assessment & Plan Note (Signed)
The patient needs to check her blood pressure twice daily while on treatment

## 2021-02-04 NOTE — Assessment & Plan Note (Signed)
We have discussions about goals of care In the future, once we have achieved complete response, she will go on maintenance treatment with bevacizumab

## 2021-02-04 NOTE — Telephone Encounter (Signed)
Notified Patient of Prior Authorization approval for Lidocaine-Prilocaine 2.5% Cream. Medication is approved through 05/05/2021

## 2021-02-07 NOTE — Progress Notes (Signed)
Pharmacist Chemotherapy Monitoring - Initial Assessment    Anticipated start date: 02/14/21   The following has been reviewed per standard work regarding the patient's treatment regimen: The patient's diagnosis, treatment plan and drug doses, and organ/hematologic function Lab orders and baseline tests specific to treatment regimen  The treatment plan start date, drug sequencing, and pre-medications Prior authorization status  Patient's documented medication list, including drug-drug interaction screen and prescriptions for anti-emetics and supportive care specific to the treatment regimen The drug concentrations, fluid compatibility, administration routes, and timing of the medications to be used The patient's access for treatment and lifetime cumulative dose history, if applicable  The patient's medication allergies and previous infusion related reactions, if applicable   Changes made to treatment plan:  N/A  Follow up needed:  Pending authorization for treatment   Benn Moulder, PharmD Pharmacy Resident  02/07/2021 3:07 PM

## 2021-02-14 ENCOUNTER — Other Ambulatory Visit: Payer: Self-pay

## 2021-02-14 ENCOUNTER — Inpatient Hospital Stay: Payer: Medicare HMO

## 2021-02-14 VITALS — BP 135/73 | HR 65 | Temp 98.3°F | Resp 18 | Ht 65.0 in | Wt 153.0 lb

## 2021-02-14 DIAGNOSIS — R590 Localized enlarged lymph nodes: Secondary | ICD-10-CM

## 2021-02-14 DIAGNOSIS — C561 Malignant neoplasm of right ovary: Secondary | ICD-10-CM

## 2021-02-14 DIAGNOSIS — Z5112 Encounter for antineoplastic immunotherapy: Secondary | ICD-10-CM | POA: Diagnosis not present

## 2021-02-14 LAB — CBC WITH DIFFERENTIAL (CANCER CENTER ONLY)
Abs Immature Granulocytes: 0.01 10*3/uL (ref 0.00–0.07)
Basophils Absolute: 0 10*3/uL (ref 0.0–0.1)
Basophils Relative: 1 %
Eosinophils Absolute: 0.1 10*3/uL (ref 0.0–0.5)
Eosinophils Relative: 2 %
HCT: 35.4 % — ABNORMAL LOW (ref 36.0–46.0)
Hemoglobin: 11.8 g/dL — ABNORMAL LOW (ref 12.0–15.0)
Immature Granulocytes: 0 %
Lymphocytes Relative: 29 %
Lymphs Abs: 1.8 10*3/uL (ref 0.7–4.0)
MCH: 36.3 pg — ABNORMAL HIGH (ref 26.0–34.0)
MCHC: 33.3 g/dL (ref 30.0–36.0)
MCV: 108.9 fL — ABNORMAL HIGH (ref 80.0–100.0)
Monocytes Absolute: 0.4 10*3/uL (ref 0.1–1.0)
Monocytes Relative: 7 %
Neutro Abs: 4 10*3/uL (ref 1.7–7.7)
Neutrophils Relative %: 61 %
Platelet Count: 186 10*3/uL (ref 150–400)
RBC: 3.25 MIL/uL — ABNORMAL LOW (ref 3.87–5.11)
RDW: 14.5 % (ref 11.5–15.5)
WBC Count: 6.4 10*3/uL (ref 4.0–10.5)
nRBC: 0 % (ref 0.0–0.2)

## 2021-02-14 LAB — CMP (CANCER CENTER ONLY)
ALT: 15 U/L (ref 0–44)
AST: 26 U/L (ref 15–41)
Albumin: 4 g/dL (ref 3.5–5.0)
Alkaline Phosphatase: 53 U/L (ref 38–126)
Anion gap: 6 (ref 5–15)
BUN: 15 mg/dL (ref 8–23)
CO2: 28 mmol/L (ref 22–32)
Calcium: 9.4 mg/dL (ref 8.9–10.3)
Chloride: 105 mmol/L (ref 98–111)
Creatinine: 0.84 mg/dL (ref 0.44–1.00)
GFR, Estimated: >60 mL/min (ref >60)
Glucose, Bld: 138 mg/dL — ABNORMAL HIGH (ref 70–99)
Potassium: 4.4 mmol/L (ref 3.5–5.1)
Sodium: 139 mmol/L (ref 135–145)
Total Bilirubin: 0.5 mg/dL (ref 0.3–1.2)
Total Protein: 7.4 g/dL (ref 6.5–8.1)

## 2021-02-14 LAB — TOTAL PROTEIN, URINE DIPSTICK: Protein, ur: NEGATIVE mg/dL

## 2021-02-14 MED ORDER — SODIUM CHLORIDE 0.9 % IV SOLN
5.0000 mg/kg | Freq: Once | INTRAVENOUS | Status: AC
  Administered 2021-02-14: 350 mg via INTRAVENOUS
  Filled 2021-02-14: qty 14

## 2021-02-14 MED ORDER — SODIUM CHLORIDE 0.9% FLUSH: 10.0000 mL | INTRAVENOUS | Status: DC | PRN

## 2021-02-14 MED ORDER — SODIUM CHLORIDE 0.9 % IV SOLN: Freq: Once | INTRAVENOUS | Status: AC

## 2021-02-14 MED ORDER — LEUCOVORIN CALCIUM INJECTION 350 MG
400.0000 mg/m2 | Freq: Once | INTRAVENOUS | Status: AC
  Administered 2021-02-14: 716 mg via INTRAVENOUS
  Filled 2021-02-14: qty 35.8

## 2021-02-14 MED ORDER — HEPARIN SOD (PORK) LOCK FLUSH 100 UNIT/ML IV SOLN: 500.0000 [IU] | Freq: Once | INTRAVENOUS | Status: DC | PRN

## 2021-02-14 MED ORDER — SODIUM CHLORIDE 0.9 % IV SOLN
10.0000 mg | Freq: Once | INTRAVENOUS | Status: AC
  Administered 2021-02-14: 10 mg via INTRAVENOUS
  Filled 2021-02-14: qty 10

## 2021-02-14 MED ORDER — OXALIPLATIN CHEMO INJECTION 100 MG/20ML
85.0000 mg/m2 | Freq: Once | INTRAVENOUS | Status: AC
  Administered 2021-02-14: 150 mg via INTRAVENOUS
  Filled 2021-02-14: qty 20

## 2021-02-14 MED ORDER — SODIUM CHLORIDE 0.9 % IV SOLN
2400.0000 mg/m2 | INTRAVENOUS | Status: DC
  Administered 2021-02-14: 4300 mg via INTRAVENOUS
  Filled 2021-02-14: qty 86

## 2021-02-14 MED ORDER — DEXTROSE 5 % IV SOLN: Freq: Once | INTRAVENOUS | Status: AC

## 2021-02-14 MED ORDER — SODIUM CHLORIDE 0.9% FLUSH
10.0000 mL | Freq: Once | INTRAVENOUS | Status: AC
  Administered 2021-02-14: 10 mL

## 2021-02-14 MED ORDER — PALONOSETRON HCL INJECTION 0.25 MG/5ML
0.2500 mg | Freq: Once | INTRAVENOUS | Status: AC
  Administered 2021-02-14: 0.25 mg via INTRAVENOUS
  Filled 2021-02-14: qty 5

## 2021-02-14 NOTE — Patient Instructions (Signed)
[facility] ONCOLOG[facility]  Discharge Instructions: Thank you for choosing [facility] to provide your oncology and hematology care.   If you have a lab appointment with the [facility], please go directly to the [facility] and check in at the registration area.   Wear comfortable clothing and clothing appropriate for easy access to any Portacath or PICC line.   We strive to give you quality time with your provider. You may need to reschedule your appointment if you arrive late (15 or more minutes).  Arriving late affects you and other patients whose appointments are after yours.  Also, if you miss three or more appointments without notifying the office, you may be dismissed from the clinic at the provider's discretion.      For prescription refill requests, have your pharmacy contact our office and allow 72 hours for refills to be completed.    Today you received the following chemotherapy and/or immunotherapy agents Avastin, oxaliplatin, leucovorin and 5 FU      To help prevent nausea and vomiting after your treatment, we encourage you to take your nausea medication as directed.  BELOW ARE SYMPTOMS THAT SHOULD BE REPORTED IMMEDIATELY: *FEVER GREATER THAN 100.4 F (38 C) OR HIGHER *CHILLS OR SWEATING *NAUSEA AND VOMITING THAT IS NOT CONTROLLED WITH YOUR NAUSEA MEDICATION *UNUSUAL SHORTNESS OF BREATH *UNUSUAL BRUISING OR BLEEDING *URINARY PROBLEMS (pain or burning when urinating, or frequent urination) *BOWEL PROBLEMS (unusual diarrhea, constipation, pain near the anus) TENDERNESS IN MOUTH AND THROAT WITH OR WITHOUT PRESENCE OF ULCERS (sore throat, sores in mouth, or a toothache) UNUSUAL RASH, SWELLING OR PAIN  UNUSUAL VAGINAL DISCHARGE OR ITCHING   Items with * indicate a potential emergency and should be followed up as soon as possible or go to the Emergency Department if any problems should occur.  Please show the CHEMOTHERAPY ALERT CARD or  IMMUNOTHERAPY ALERT CARD at check-in to the Emergency Department and triage nurse.  Should you have questions after your visit or need to cancel or reschedule your appointment, please contact [facility]  Dept: [phone]  and follow the prompts.  Office hours are 8:00 a.m. to 4:30 p.m. Monday - Friday. Please note that voicemails left after 4:00 p.m. may not be returned until the following business day.  We are closed weekends and major holidays. You have access to a nurse at all times for urgent questions. Please call the main number to the clinic Dept: [phone] and follow the prompts.   For any non-urgent questions, you may also contact your provider using MyChart. We now offer e-Visits for anyone 39 and older to request care online for non-urgent symptoms. For details visit [email or web address].   Also download the MyChart app! Go to the app store, search "MyChart", open the app, select [facility], and log in with your MyChart username and password.  Due to Covid, a mask is required upon entering the hospital/clinic. If you do not have a mask, one will be given to you upon arrival. For doctor visits, patients may have 1 support person aged 12 or older with them. For treatment visits, patients cannot have anyone with them due to current Covid guidelines and our immunocompromised population.

## 2021-02-15 ENCOUNTER — Telehealth: Payer: Self-pay | Admitting: Hematology and Oncology

## 2021-02-15 LAB — CA 125: Cancer Antigen (CA) 125: 524 U/mL — ABNORMAL HIGH (ref 0.0–38.1)

## 2021-02-16 ENCOUNTER — Other Ambulatory Visit: Payer: Self-pay

## 2021-02-16 ENCOUNTER — Telehealth: Payer: Self-pay

## 2021-02-16 ENCOUNTER — Inpatient Hospital Stay: Payer: Medicare HMO

## 2021-02-16 VITALS — BP 134/64 | HR 70 | Temp 99.0°F | Resp 17

## 2021-02-16 DIAGNOSIS — Z5112 Encounter for antineoplastic immunotherapy: Secondary | ICD-10-CM | POA: Diagnosis not present

## 2021-02-16 DIAGNOSIS — C561 Malignant neoplasm of right ovary: Secondary | ICD-10-CM

## 2021-02-16 MED ORDER — HEPARIN SOD (PORK) LOCK FLUSH 100 UNIT/ML IV SOLN
500.0000 [IU] | Freq: Once | INTRAVENOUS | Status: AC
  Administered 2021-02-16: 500 [IU]

## 2021-02-16 MED ORDER — SODIUM CHLORIDE 0.9% FLUSH
10.0000 mL | Freq: Once | INTRAVENOUS | Status: AC
  Administered 2021-02-16: 10 mL

## 2021-02-16 NOTE — Telephone Encounter (Signed)
Called pt regarding scheduling of her 11/7 appointments.  Appointments moved to earlier time

## 2021-02-24 ENCOUNTER — Other Ambulatory Visit: Payer: Self-pay

## 2021-02-24 ENCOUNTER — Ambulatory Visit (HOSPITAL_COMMUNITY)
Admission: RE | Admit: 2021-02-24 | Discharge: 2021-02-24 | Disposition: A | Discharge status: Home / Self Care | Payer: Medicare HMO | Source: Ambulatory Visit | Attending: Internal Medicine | Admitting: Internal Medicine

## 2021-02-24 DIAGNOSIS — Z1231 Encounter for screening mammogram for malignant neoplasm of breast: Secondary | ICD-10-CM | POA: Diagnosis not present

## 2021-02-25 MED FILL — Dexamethasone Sodium Phosphate Inj 100 MG/10ML: INTRAMUSCULAR | Qty: 1 | Status: AC

## 2021-02-28 ENCOUNTER — Inpatient Hospital Stay: Payer: Medicare HMO | Admitting: Hematology and Oncology

## 2021-02-28 ENCOUNTER — Inpatient Hospital Stay: Payer: Medicare HMO | Attending: Hematology and Oncology

## 2021-02-28 ENCOUNTER — Other Ambulatory Visit: Payer: Self-pay

## 2021-02-28 ENCOUNTER — Other Ambulatory Visit: Payer: Medicare HMO

## 2021-02-28 ENCOUNTER — Encounter: Payer: Self-pay | Admitting: Hematology and Oncology

## 2021-02-28 ENCOUNTER — Ambulatory Visit: Payer: Medicare HMO | Admitting: Hematology and Oncology

## 2021-02-28 ENCOUNTER — Inpatient Hospital Stay: Payer: Medicare HMO

## 2021-02-28 DIAGNOSIS — Z452 Encounter for adjustment and management of vascular access device: Secondary | ICD-10-CM | POA: Insufficient documentation

## 2021-02-28 DIAGNOSIS — C561 Malignant neoplasm of right ovary: Secondary | ICD-10-CM

## 2021-02-28 DIAGNOSIS — C563 Malignant neoplasm of bilateral ovaries: Secondary | ICD-10-CM | POA: Insufficient documentation

## 2021-02-28 DIAGNOSIS — D6481 Anemia due to antineoplastic chemotherapy: Secondary | ICD-10-CM | POA: Insufficient documentation

## 2021-02-28 DIAGNOSIS — R590 Localized enlarged lymph nodes: Secondary | ICD-10-CM

## 2021-02-28 DIAGNOSIS — I10 Essential (primary) hypertension: Secondary | ICD-10-CM | POA: Diagnosis not present

## 2021-02-28 DIAGNOSIS — T451X5A Adverse effect of antineoplastic and immunosuppressive drugs, initial encounter: Secondary | ICD-10-CM | POA: Diagnosis not present

## 2021-02-28 DIAGNOSIS — Z5112 Encounter for antineoplastic immunotherapy: Secondary | ICD-10-CM | POA: Insufficient documentation

## 2021-02-28 DIAGNOSIS — Z5111 Encounter for antineoplastic chemotherapy: Secondary | ICD-10-CM | POA: Diagnosis present

## 2021-02-28 LAB — CMP (CANCER CENTER ONLY)
ALT: 16 U/L (ref 0–44)
AST: 22 U/L (ref 15–41)
Albumin: 3.3 g/dL — ABNORMAL LOW (ref 3.5–5.0)
Alkaline Phosphatase: 71 U/L (ref 38–126)
Anion gap: 8 (ref 5–15)
BUN: 13 mg/dL (ref 8–23)
CO2: 27 mmol/L (ref 22–32)
Calcium: 9.1 mg/dL (ref 8.9–10.3)
Chloride: 104 mmol/L (ref 98–111)
Creatinine: 0.9 mg/dL (ref 0.44–1.00)
GFR, Estimated: >60 mL/min (ref >60)
Glucose, Bld: 186 mg/dL — ABNORMAL HIGH (ref 70–99)
Potassium: 4.4 mmol/L (ref 3.5–5.1)
Sodium: 139 mmol/L (ref 135–145)
Total Bilirubin: 0.5 mg/dL (ref 0.3–1.2)
Total Protein: 7.5 g/dL (ref 6.5–8.1)

## 2021-02-28 LAB — CBC WITH DIFFERENTIAL (CANCER CENTER ONLY)
Abs Immature Granulocytes: 0.01 10*3/uL (ref 0.00–0.07)
Basophils Absolute: 0 10*3/uL (ref 0.0–0.1)
Basophils Relative: 1 %
Eosinophils Absolute: 0.1 10*3/uL (ref 0.0–0.5)
Eosinophils Relative: 2 %
HCT: 32.8 % — ABNORMAL LOW (ref 36.0–46.0)
Hemoglobin: 11.1 g/dL — ABNORMAL LOW (ref 12.0–15.0)
Immature Granulocytes: 0 %
Lymphocytes Relative: 26 %
Lymphs Abs: 1.4 10*3/uL (ref 0.7–4.0)
MCH: 35.8 pg — ABNORMAL HIGH (ref 26.0–34.0)
MCHC: 33.8 g/dL (ref 30.0–36.0)
MCV: 105.8 fL — ABNORMAL HIGH (ref 80.0–100.0)
Monocytes Absolute: 0.5 10*3/uL (ref 0.1–1.0)
Monocytes Relative: 10 %
Neutro Abs: 3.4 10*3/uL (ref 1.7–7.7)
Neutrophils Relative %: 61 %
Platelet Count: 232 10*3/uL (ref 150–400)
RBC: 3.1 MIL/uL — ABNORMAL LOW (ref 3.87–5.11)
RDW: 14 % (ref 11.5–15.5)
WBC Count: 5.5 10*3/uL (ref 4.0–10.5)
nRBC: 0 % (ref 0.0–0.2)

## 2021-02-28 MED ORDER — SODIUM CHLORIDE 0.9 % IV SOLN
2400.0000 mg/m2 | INTRAVENOUS | Status: DC
  Administered 2021-02-28: 4300 mg via INTRAVENOUS
  Filled 2021-02-28: qty 86

## 2021-02-28 MED ORDER — SODIUM CHLORIDE 0.9 % IV SOLN
10.0000 mg | Freq: Once | INTRAVENOUS | Status: AC
  Administered 2021-02-28: 10 mg via INTRAVENOUS
  Filled 2021-02-28: qty 10

## 2021-02-28 MED ORDER — LEUCOVORIN CALCIUM INJECTION 350 MG
400.0000 mg/m2 | Freq: Once | INTRAVENOUS | Status: AC
  Administered 2021-02-28: 716 mg via INTRAVENOUS
  Filled 2021-02-28: qty 35.8

## 2021-02-28 MED ORDER — SODIUM CHLORIDE 0.9% FLUSH
10.0000 mL | Freq: Once | INTRAVENOUS | Status: AC
  Administered 2021-02-28: 10 mL

## 2021-02-28 MED ORDER — PALONOSETRON HCL INJECTION 0.25 MG/5ML
0.2500 mg | Freq: Once | INTRAVENOUS | Status: AC
  Administered 2021-02-28: 0.25 mg via INTRAVENOUS
  Filled 2021-02-28: qty 5

## 2021-02-28 MED ORDER — SODIUM CHLORIDE 0.9 % IV SOLN
5.0000 mg/kg | Freq: Once | INTRAVENOUS | Status: AC
  Administered 2021-02-28: 350 mg via INTRAVENOUS
  Filled 2021-02-28: qty 14

## 2021-02-28 MED ORDER — SODIUM CHLORIDE 0.9 % IV SOLN
Freq: Once | INTRAVENOUS | Status: AC
Start: 2021-02-28 — End: 2021-02-28

## 2021-02-28 MED ORDER — DEXTROSE 5 % IV SOLN: Freq: Once | INTRAVENOUS | Status: AC

## 2021-02-28 MED ORDER — OXALIPLATIN CHEMO INJECTION 100 MG/20ML
85.0000 mg/m2 | Freq: Once | INTRAVENOUS | Status: AC
  Administered 2021-02-28: 150 mg via INTRAVENOUS
  Filled 2021-02-28: qty 20

## 2021-02-28 NOTE — Assessment & Plan Note (Signed)
She tolerated cycle 1 of treatment very well without major side effects Her blood pressure is well controlled I recommend minimum 2 cycles of treatment of 4 doses of chemotherapy before repeating CT imaging

## 2021-02-28 NOTE — Assessment & Plan Note (Signed)
The patient needs to check her blood pressure twice daily while on treatment

## 2021-02-28 NOTE — Progress Notes (Signed)
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   All questions were answered. The patient knows to call the clinic with any problems, questions or concerns. The total time spent in the appointment was 20 minutes encounter with patients including review of chart and various tests results, discussions about plan of care and coordination of care plan   [name], MD 02/28/2021 1:48 PM  INTERVAL HISTORY: Please see below for problem oriented charting. 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 She tolerated last cycle of treatment well No worsening peripheral neuropathy Denies nausea or diarrhea Her documented blood pressure from home were within normal range  REVIEW OF SYSTEMS:   Constitutional: Denies fevers, chills or abnormal weight loss Eyes: Denies blurriness of vision Ears, nose, mouth, throat, and face: Denies mucositis or sore throat Respiratory: Denies cough, dyspnea or wheezes Cardiovascular: Denies palpitation, chest discomfort or lower extremity swelling Gastrointestinal:  Denies nausea, heartburn or change in bowel habits Skin: Denies abnormal skin rashes Lymphatics: Denies new lymphadenopathy or easy  bruising Neurological:Denies numbness, tingling or new weaknesses Behavioral/Psych: Mood is stable, no new changes  All other systems were reviewed with the patient and are negative.  I have reviewed the past medical history, past surgical history, social history and family history with the patient and they are unchanged from previous note.  ALLERGIES:  has No Known Allergies.  MEDICATIONS:  Current Outpatient Medications  Medication Sig Dispense Refill   acetaminophen (TYLENOL) 500 MG tablet Take 500 mg by mouth every 8 (eight) hours as needed for mild pain or moderate pain.     atenolol (TENORMIN) 25 MG tablet Take 25 mg by mouth at bedtime.      atorvastatin (LIPITOR) 20 MG tablet Take 20 mg by mouth daily.     CALCIUM-MAGNESIUM-VITAMIN D PO Take 1 tablet by mouth daily.     lidocaine-prilocaine (EMLA) cream Apply 1 application topically as needed. 30 g 6   lidocaine-prilocaine (EMLA) cream Apply to affected area once 30 g 3   losartan (COZAAR) 50 MG tablet Take 50 mg by mouth at bedtime.     metFORMIN (GLUCOPHAGE) 1000 MG tablet Take 500 mg by mouth 2 (two) times daily.   0   Multiple Vitamin (MULTIVITAMIN WITH MINERALS) TABS tablet Take 1 tablet by mouth daily.     ondansetron (ZOFRAN) 8 MG tablet Take 1 tablet (8 mg total) by mouth every 8 (eight) hours as needed for refractory nausea / vomiting. 30 tablet 1   prochlorperazine (COMPAZINE) 10 MG tablet Take 1 tablet (10 mg total) by mouth every 6 (six) hours as needed (Nausea or vomiting). 30 tablet 1   No current facility-administered medications for this visit.   Facility-Administered Medications Ordered in Other Visits  Medication Dose Route Frequency Provider  Last Rate Last Admin   fluorouracil (ADRUCIL) 4,300 mg in sodium chloride 0.9 % 64 mL chemo infusion  2,400 mg/m2 (Treatment Plan Recorded) Intravenous 1 day or 1 dose Alvy Bimler, Sebastion Jun, MD       leucovorin 716 mg in dextrose 5 % 250 mL infusion  400 mg/m2 (Treatment Plan  Recorded) Intravenous Once [name], MD 143 mL/hr at 02/28/21 1248 716 mg at 02/28/21 1248   oxaliplatin (ELOXATIN) 150 mg in dextrose 5 % 500 mL chemo infusion  85 mg/m2 (Treatment Plan Recorded) Intravenous Once [name], MD 265 mL/hr at 02/28/21 1245 150 mg at 02/28/21 1245    SUMMARY OF ONCOLOGIC HISTORY: Oncology History Overview Note  ER 15-20%, PR 5-10%, Her2/neu negative MMR normal Negative genetic testing HRD pos BRCA1 positive MSI Stable   Right ovarian epithelial cancer ([place])  07/30/2017 Imaging   US pelvis Ultrasound revealed a complex cystic mass in the right adnexa measuring 20 x 11 x 12 cm with multiple internal septations some of which are thick. The left adnexa measured 12.7 x 11.6 x 8.1 with low level echoes and soft tissue nodules    07/30/2017 Tumor Marker   Patient's tumor was tested for the following markers: CA-125 Results of the tumor marker test revealed 521.3   08/17/2017 Pathology Results   The malignant cells are positive for PAX-8, cytokeratin 7, estrogen receptor, and faintly positive for GATA-3. They are negative for p53, GCDFP, and cytokeratin 20. The finding are consistent with a gynecologic primary carcinoma. Additional studies can be performed upon clinician request.   08/17/2017 Procedure   Technically successful CT-guided left lower quadrant omental mass core biopsy.   08/20/2017 PET scan   1. Cystic masses arising from the pelvis. The nodular component of the RIGHT cystic mass is intensely hypermetabolic consistent with malignant ovarian neoplasm. 2. Extensive hypermetabolic peritoneal thickening in the lower abdomen and upper pelvis, upper abdomen, and upper abdominal precordial fat and paradiaphragmatic fat. 3. Retroperitoneal nodal metastasis adjacent to the IVC at the level the kidneys. 4. No evidence of metastatic disease in the thorax other small effusion on the LEFT and nodal metastasis in the fat superior to the diaphragm. 5. Mild  metabolic activity associated the distal esophagus is favored benign esophagitis.   08/20/2017 Imaging   CT chest 1. Bilateral cardiophrenic angle nodal metastasis. No additional findings to suggest metastatic disease to the chest. 2. Small left pleural effusion. 3. Peritoneal carcinomatosis noted within the abdomen. 4. Hepatic steatosis.     08/24/2017 Cancer Staging   Staging form: Ovary, Fallopian Tube, and Primary Peritoneal Carcinoma, AJCC 8th Edition - Clinical: Stage IV (cT3, cN1, cM1) - Signed by [name], MD on 08/24/2017    08/31/2017 Tumor Marker   Patient's tumor was tested for the following markers: CA-125 Results of the tumor marker test revealed 819.9   09/26/2017 Adverse Reaction   She developed reaction to Taxol, managed successfully with additional premedications   10/17/2017 Tumor Marker   Patient's tumor was tested for the following markers: CA-125 Results of the tumor marker test revealed 374.4   10/31/2017 Imaging   1. No significant change in size of the large complex bilateral adnexal masses consistent with the known history of ovarian cancer. There is increased dependent density within the left-sided lesion. 2. Stable peritoneal carcinomatosis.  No significant ascites.  3. The bilateral pericardiac adenopathy has improved. No progressive thoracic metastatic disease. No pulmonary or osseous metastatic disease. 4. Suspicion of nonocclusive thrombus in the right internal jugular  vein related to the right IJ port. Recommend further evaluation with Doppler ultrasound.   11/13/2017 Surgery   Preoperative Diagnosis: Ovarian cancer s/p neoadjuvant chemotherapy    Procedure(s) Performed:   Exploratory laparotomy  Bilateral salpingo-oophorectomy with radical tumor debulking for ovarian cancer  including retroperitoneal dissection, lysis of adhesions (enterolysis of small bowel in deep pelvis, left adnexal adhesions to sigmoid and omentum to LLQ) ~1 hour,  ureterolysis. Omentectomy  Takedown of splenic flexure with removal of omental tumor in left upper quadrant    Specimens: Bilateral tubes and ovaries, omentum, splenic flexure nodule, right paracolic gutter nodule, cecal nodule, right ureteral peritoneal nodule, small bowel mesentary.    Indication for Procedure:  Patient is s/p 3 cycles of chemotherapy with response based on CA125 and decreased mediastinal disease (to <1cm).   Operative Findings:  This represented an optimal cytoreduction with gross residual disease remaining in the deep pelvis near the levator floor and possibly in the region of the right IP ligament/periappendicial region.   Upon entry a large ~20cm right tube/ovary, mostly cystic. Adherent rind along ileocecal region and appendix. Large cystic left tube/ovary ~10cm with sigmoid colon stretched across the mass and extension of the cystic lesion into the deep pelvis with residual palpable disease deep pelvis near levators. Estimate ~1cm or less on palpation, not visible disease. Omental caking in the LLQ adherent to pelvic sidewall. Omental disease noted RUQ separate. In addition ~1.5cm lesion in splenic flexure omentum. Evidence of prior diaphragmatic disease, no visible disease.       11/13/2017 Pathology Results   1. 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 - TUMOR INVOLVES THE OVARIAN SURFACE. - RIGHT FALLOPIAN TUBE IS INVOLVED. - SEE ONCOLOGY TABLE. - SEE NOTE. 2. Soft tissue mass, biopsy, cecal gutter nodule - METASTATIC MUCINOUS ADENOCARCINOMA. 3. Ovary and fallopian tube, left, left ovary and fallopian tube - METASTATIC MUCINOUS ADENOCARCINOMA TO LEFT OVARY AND FALLOPIAN TUBE. 4. Omentum, resection for tumor - METASTATIC MUCINOUS ADENOCARCINOMA TO OMENTUM. 5. Soft tissue mass, biopsy, splenic flexure nodule - METASTATIC MUCINOUS ADENOCARCINOMA. 6. Soft tissue mass, biopsy, cecal implant - METASTATIC  MUCINOUS ADENOCARCINOMA. 7. Soft tissue mass, biopsy, right ureteral peritoneal implant - METASTATIC MUCINOUS ADENOCARCINOMA. 8. Mesentery, small bowel nodule - METASTATIC MUCINOUS ADENOCARCINOMA. Microscopic Comment 1. OVARY or FALLOPIAN TUBE or PRIMARY PERITONEUM: Procedure: Bilateral salpingo-oophorectomy. Specimen Integrity: Intact. Tumor Site: Right ovary. Ovarian Surface Involvement: Present. Fallopian Tube Surface Involvement: Present. Tumor Size: 20 cm. Histologic Type: Mucinous adenocarcinoma. Histologic Grade: Overall G2 (moderately differentiated) (focal areas of poor differentiation are present). Implants: Present. Other Tissue/ Organ Involvement: Left ovary, left fallopian tube and omentum. Largest Extrapelvic Peritoneal Focus: less than 2 cm. See note Peritoneal/Ascitic Fluid: N/A Treatment Effect: No definite or minimal response identified (chemotherapy response score 1 [CRS 1]) Regional Lymph Nodes No lymph nodes submitted or found Number of Lymph Nodes Examined: 0 Pathologic Stage Classification (pTNM, AJCC 8th Edition): ypT3b, ypNX. Representative Tumor Block: 1D and 1E. (NDK:gt, 11/15/17)   11/19/2017 Genetic Testing   Negative genetic testing on the [employer] Myrisk panel.  The [facility] gene panel offered by [employer] includes sequencing and deletion/duplication testing of the following 35 genes: APC, ATM, AXIN2, BARD1, BMPR1A, BRCA1, BRCA2, BRIP1, CHD1, CDK4, CDKN2A, CHEK2, EPCAM (large rearrangement only), HOXB13, (sequencing only), GALNT12, MLH1, MSH2, MSH3 (excluding repetitive portions of exon 1), MSH6, MUTYH, NBN, NTHL1, PALB2, PMS2, PTEN, RAD51C, RAD51D, RNF43, RPS20, SMAD4, STK11, and TP53. Sequencing was performed for select  regions of POLE and POLD1, and large rearrangement analysis was performed for select regions of GREM1. The report date is November 19, 2017.  HRD tumor results indicate a BRCA1 mutation identified in the ovarian tumor causing  genomic instability. The report date is 12/04/2017     Genetic Testing   Patient has genetic testing done for ER/PR and Her2/neu. Results revealed patient has the following: ER 15-20% PR 5-10% Her2/neu - negative    Genetic Testing   Patient has genetic testing done for MSI/MMR. Results revealed patient has the following mutation(s): MMR normal, MSI stable   12/10/2017 Imaging   1. Interval resection of large bilateral adnexal cystic masses. Residual soft tissue/tumor within bilateral adnexal regions as above. 2. Interval resolution of previously noted omental cake which may reflect interval omentectomy. There is a new peritoneal deposit identified within the left upper quadrant of the abdomen involving the anterior surface of the spleen.   12/10/2017 Tumor Marker   Patient's tumor was tested for the following markers: CA-125 Results of the tumor marker test revealed 94.9   12/11/2017 - 02/20/2018 Chemotherapy   The patient had FOLFOX regimen for mucinous   01/09/2018 Tumor Marker   Patient's tumor was tested for the following markers: CA-125 Results of the tumor marker test revealed 37   01/31/2018 Imaging   Resolution of peritoneal soft tissue density along the anterior margin of the spleen since prior study. No evidence of residual or progressive metastatic disease.  Mild sigmoid diverticulosis, without radiographic evidence of diverticulitis. Resolution of small pericolonic abscess along the left lateral pelvic sidewall.  Mild hepatic steatosis.   02/06/2018 Tumor Marker   Patient's tumor was tested for the following markers: CA-125 Results of the tumor marker test revealed 36.7   03/06/2018 -  Chemotherapy   The patient is started on [name] as PARP maintenance   03/13/2018 Tumor Marker   Patient's tumor was tested for the following markers: CA-125 Results of the tumor marker test revealed 33.8   04/05/2018 Tumor Marker   Patient's tumor was tested for the following  markers: CA-125 Results of the tumor marker test revealed 27.3   05/03/2018 Tumor Marker   Patient's tumor was tested for the following markers: CA-125 Results of the tumor marker test revealed 23.5   07/08/2018 Tumor Marker   Patient's tumor was tested for the following markers: CA-125 Results of the tumor marker test revealed 19.9   09/02/2018 Tumor Marker   Patient's tumor was tested for the following markers: CA-125 Results of the tumor marker test revealed 17   10/28/2018 Imaging   1. 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 Repeat abdominal MRI with and without IV gadolinium with MRCP is recommended in 2 years to ensure continued stability. This recommendation follows ACR consensus guidelines: Management of Incidental Pancreatic Cysts: A White Paper of the ACR Incidental Findings Committee. Crisp [id number];62:836-629. 2. Hepatic steatosis. 3. Additional incidental findings, as above.     10/28/2018 Tumor Marker   Patient's tumor was tested for the following markers: CA-125 Results of the tumor marker test revealed 16.9   03/03/2019 Tumor Marker   Patient's tumor was tested for the following markers: CA-125 Results of the tumor marker test revealed 12.7.   07/09/2019 Tumor Marker   Patient's tumor was tested for the following markers: CA-125 Results of the tumor marker test revealed 9.5.  09/02/2019 Tumor Marker   Patient's tumor was tested for the following markers: CA-125 Results of the tumor marker test revealed 11.   10/28/2019 Tumor Marker   Patient's tumor was tested for the following markers: CA-125 Results of the tumor marker test revealed 10.2   12/23/2019 Tumor Marker   Patient's tumor was tested for the following markers: CA-125 Results of the tumor marker test revealed 12.1   02/17/2020 Tumor Marker   Patient's tumor was  tested for the following markers: CA-125 Results of the tumor marker test revealed 9.5   05/25/2020 Tumor Marker   Patient's tumor was tested for the following markers: CA-125 Results of the tumor marker test revealed 9.6   07/13/2020 Tumor Marker   Patient's tumor was tested for the following markers: CA-125 Results of the tumor marker test revealed 10.   11/01/2020 Tumor Marker   Patient's tumor was tested for the following markers: CA-125. Results of the tumor marker test revealed 18.1.   11/01/2020 Imaging   1. Cystic pancreatic lesions, potentially small intraductal papillary mucinous neoplasm. Potential slight increase in size of 1 of these areas in particular near the tail of the pancreas with perhaps mild increase in ventral duct dilation but no main pancreatic duct dilation. Given patient history consider six-month follow-up to assess for stability. 7 mm as the largest area with potential increase in size from 3 mm. 2. Hepatic steatosis. 3. Postoperative changes in the midline of the abdomen.   01/25/2021 Tumor Marker   Patient's tumor was tested for the following markers: CA-125. Results of the tumor marker test revealed 220.   02/03/2021 Imaging   Increased peritoneal soft tissue thickening and nodularity within the lower abdomen and pelvis, with minimal ascites. This is consistent with progressive peritoneal carcinomatosis.   New small ventral abdominal wall hernias.   Colonic diverticulosis. No radiographic evidence of diverticulitis.   Aortic Atherosclerosis (ICD10-I70.0).   02/14/2021 -  Chemotherapy   Patient is on Treatment Plan : Ovarian FOLFOX + Bevacizumab q14d     02/14/2021 Tumor Marker   Patient's tumor was tested for the following markers: CA-125. Results of the tumor marker test revealed 524.     PHYSICAL EXAMINATION: ECOG PERFORMANCE STATUS: 1 - Symptomatic but completely ambulatory  Vitals:   02/28/21 1007  BP: 136/68  Pulse: 71  Resp: 18  Temp:  98.2 F (36.8 C)  SpO2: 99%   Filed Weights   02/28/21 1007  Weight: 148 lb 9.6 oz (67.4 kg)    GENERAL:alert, no distress and comfortable SKIN: skin color, texture, turgor are normal, no rashes or significant lesions EYES: normal, Conjunctiva are pink and non-injected, sclera clear OROPHARYNX:no exudate, no erythema and lips, buccal mucosa, and tongue normal  NECK: supple, thyroid normal size, non-tender, without nodularity LYMPH:  no palpable lymphadenopathy in the cervical, axillary or inguinal LUNGS: clear to auscultation and percussion with normal breathing effort HEART: regular rate & rhythm and no murmurs and no lower extremity edema ABDOMEN:abdomen soft, non-tender and normal bowel sounds Musculoskeletal:no cyanosis of digits and no clubbing  NEURO: alert & oriented x 3 with fluent speech, no focal motor/sensory deficits  LABORATORY DATA:  I have reviewed the data as listed    Component Value Date/Time   NA 139 02/28/2021 0944   K 4.4 02/28/2021 0944   CL 104 02/28/2021 0944   CO2 27 02/28/2021 0944   GLUCOSE 186 (H) 02/28/2021 0944   BUN 13 02/28/2021 0944   CREATININE 0.90 02/28/2021 0944  CREATININE 0.86 06/05/2014 0957   CALCIUM 9.1 02/28/2021 0944   PROT 7.5 02/28/2021 0944   ALBUMIN 3.3 (L) 02/28/2021 0944   AST 22 02/28/2021 0944   ALT 16 02/28/2021 0944   ALKPHOS 71 02/28/2021 0944   BILITOT 0.5 02/28/2021 0944   GFRNONAA >60 02/28/2021 0944   GFRAA >60 12/23/2019 0954   GFRAA >60 04/29/2019 0915    No results found for: SPEP, UPEP  Lab Results  Component Value Date   WBC 5.5 02/28/2021   NEUTROABS 3.4 02/28/2021   HGB 11.1 (L) 02/28/2021   HCT 32.8 (L) 02/28/2021   MCV 105.8 (H) 02/28/2021   PLT 232 02/28/2021      Chemistry      Component Value Date/Time   NA 139 02/28/2021 0944   K 4.4 02/28/2021 0944   CL 104 02/28/2021 0944   CO2 27 02/28/2021 0944   BUN 13 02/28/2021 0944   CREATININE 0.90 02/28/2021 0944   CREATININE 0.86  06/05/2014 0957      Component Value Date/Time   CALCIUM 9.1 02/28/2021 0944   ALKPHOS 71 02/28/2021 0944   AST 22 02/28/2021 0944   ALT 16 02/28/2021 0944   BILITOT 0.5 02/28/2021 0944

## 2021-02-28 NOTE — Assessment & Plan Note (Signed)
The cause of her anemia is due to treatment I have ordered vitamin B12 and iron studies indicating but adequate She is not symptomatic Observe closely

## 2021-02-28 NOTE — Patient Instructions (Signed)
[facility]  Discharge Instructions: Thank you for choosing [facility] to provide your oncology and hematology care.   If you have a lab appointment with the [facility], please go directly to the [facility] and check in at the registration area.   Wear comfortable clothing and clothing appropriate for easy access to any Portacath or PICC line.   We strive to give you quality time with your provider. You may need to reschedule your appointment if you arrive late (15 or more minutes).  Arriving late affects you and other patients whose appointments are after yours.  Also, if you miss three or more appointments without notifying the office, you may be dismissed from the clinic at the provider's discretion.      For prescription refill requests, have your pharmacy contact our office and allow 72 hours for refills to be completed.    Today you received the following chemotherapy and/or immunotherapy agents: Zirabev (bevacizumab-bvzr), Oxaliplatin, Leucovorin, Adrucil (fluorouracil)      To help prevent nausea and vomiting after your treatment, we encourage you to take your nausea medication as directed.  BELOW ARE SYMPTOMS THAT SHOULD BE REPORTED IMMEDIATELY: *FEVER GREATER THAN 100.4 F (38 C) OR HIGHER *CHILLS OR SWEATING *NAUSEA AND VOMITING THAT IS NOT CONTROLLED WITH YOUR NAUSEA MEDICATION *UNUSUAL SHORTNESS OF BREATH *UNUSUAL BRUISING OR BLEEDING *URINARY PROBLEMS (pain or burning when urinating, or frequent urination) *BOWEL PROBLEMS (unusual diarrhea, constipation, pain near the anus) TENDERNESS IN MOUTH AND THROAT WITH OR WITHOUT PRESENCE OF ULCERS (sore throat, sores in mouth, or a toothache) UNUSUAL RASH, SWELLING OR PAIN  UNUSUAL VAGINAL DISCHARGE OR ITCHING   Items with * indicate a potential emergency and should be followed up as soon as possible or go to the Emergency Department if any problems should occur.  Please show the  CHEMOTHERAPY ALERT CARD or IMMUNOTHERAPY ALERT CARD at check-in to the Emergency Department and triage nurse.  Should you have questions after your visit or need to cancel or reschedule your appointment, please contact [facility]  Dept: [phone]  and follow the prompts.  Office hours are 8:00 a.m. to 4:30 p.m. Monday - Friday. Please note that voicemails left after 4:00 p.m. may not be returned until the following business day.  We are closed weekends and major holidays. You have access to a nurse at all times for urgent questions. Please call the main number to the clinic Dept: [phone] and follow the prompts.   For any non-urgent questions, you may also contact your provider using MyChart. We now offer e-Visits for anyone 23 and older to request care online for non-urgent symptoms. For details visit [email or web address].   Also download the MyChart app! Go to the app store, search "MyChart", open the app, select [facility], and log in with your MyChart username and password.  Due to Covid, a mask is required upon entering the hospital/clinic. If you do not have a mask, one will be given to you upon arrival. For doctor visits, patients may have 1 support person aged 17 or older with them. For treatment visits, patients cannot have anyone with them due to current Covid guidelines and our immunocompromised population.

## 2021-02-28 NOTE — Patient Instructions (Signed)

## 2021-02-28 NOTE — Progress Notes (Signed)
Ok to treat w/ Bevacizumab today with results of urine protein from 10/24 per Dr. Alvy Bimler

## 2021-03-02 ENCOUNTER — Other Ambulatory Visit: Payer: Self-pay

## 2021-03-02 ENCOUNTER — Inpatient Hospital Stay: Payer: Medicare HMO

## 2021-03-02 VITALS — BP 131/78 | HR 78 | Temp 98.4°F | Resp 17

## 2021-03-02 DIAGNOSIS — C561 Malignant neoplasm of right ovary: Secondary | ICD-10-CM

## 2021-03-02 DIAGNOSIS — Z5112 Encounter for antineoplastic immunotherapy: Secondary | ICD-10-CM | POA: Diagnosis not present

## 2021-03-02 MED ORDER — SODIUM CHLORIDE 0.9% FLUSH
10.0000 mL | Freq: Once | INTRAVENOUS | Status: AC
  Administered 2021-03-02: 10 mL

## 2021-03-02 MED ORDER — HEPARIN SOD (PORK) LOCK FLUSH 100 UNIT/ML IV SOLN
500.0000 [IU] | Freq: Once | INTRAVENOUS | Status: AC
  Administered 2021-03-02: 500 [IU]

## 2021-03-11 MED FILL — Dexamethasone Sodium Phosphate Inj 100 MG/10ML: INTRAMUSCULAR | Qty: 1 | Status: AC

## 2021-03-14 ENCOUNTER — Inpatient Hospital Stay: Payer: Medicare HMO

## 2021-03-14 ENCOUNTER — Other Ambulatory Visit: Payer: Self-pay | Admitting: Hematology and Oncology

## 2021-03-14 ENCOUNTER — Other Ambulatory Visit: Payer: Self-pay

## 2021-03-14 ENCOUNTER — Encounter: Payer: Self-pay | Admitting: Hematology and Oncology

## 2021-03-14 ENCOUNTER — Inpatient Hospital Stay: Payer: Medicare HMO | Admitting: Hematology and Oncology

## 2021-03-14 DIAGNOSIS — C561 Malignant neoplasm of right ovary: Secondary | ICD-10-CM

## 2021-03-14 DIAGNOSIS — I10 Essential (primary) hypertension: Secondary | ICD-10-CM

## 2021-03-14 DIAGNOSIS — R590 Localized enlarged lymph nodes: Secondary | ICD-10-CM

## 2021-03-14 DIAGNOSIS — Z5112 Encounter for antineoplastic immunotherapy: Secondary | ICD-10-CM | POA: Diagnosis not present

## 2021-03-14 DIAGNOSIS — D61818 Other pancytopenia: Secondary | ICD-10-CM

## 2021-03-14 LAB — CBC WITH DIFFERENTIAL (CANCER CENTER ONLY)
Abs Immature Granulocytes: 0 10*3/uL (ref 0.00–0.07)
Basophils Absolute: 0 10*3/uL (ref 0.0–0.1)
Basophils Relative: 1 %
Eosinophils Absolute: 0.1 10*3/uL (ref 0.0–0.5)
Eosinophils Relative: 4 %
HCT: 34.4 % — ABNORMAL LOW (ref 36.0–46.0)
Hemoglobin: 11.2 g/dL — ABNORMAL LOW (ref 12.0–15.0)
Immature Granulocytes: 0 %
Lymphocytes Relative: 57 %
Lymphs Abs: 1.8 10*3/uL (ref 0.7–4.0)
MCH: 34.1 pg — ABNORMAL HIGH (ref 26.0–34.0)
MCHC: 32.6 g/dL (ref 30.0–36.0)
MCV: 104.9 fL — ABNORMAL HIGH (ref 80.0–100.0)
Monocytes Absolute: 0.4 10*3/uL (ref 0.1–1.0)
Monocytes Relative: 13 %
Neutro Abs: 0.8 10*3/uL — ABNORMAL LOW (ref 1.7–7.7)
Neutrophils Relative %: 25 %
Platelet Count: 171 10*3/uL (ref 150–400)
RBC: 3.28 MIL/uL — ABNORMAL LOW (ref 3.87–5.11)
RDW: 14.8 % (ref 11.5–15.5)
WBC Count: 3.2 10*3/uL — ABNORMAL LOW (ref 4.0–10.5)
nRBC: 0 % (ref 0.0–0.2)

## 2021-03-14 LAB — CMP (CANCER CENTER ONLY)
ALT: 21 U/L (ref 0–44)
AST: 24 U/L (ref 15–41)
Albumin: 3.5 g/dL (ref 3.5–5.0)
Alkaline Phosphatase: 65 U/L (ref 38–126)
Anion gap: 8 (ref 5–15)
BUN: 13 mg/dL (ref 8–23)
CO2: 25 mmol/L (ref 22–32)
Calcium: 9.1 mg/dL (ref 8.9–10.3)
Chloride: 105 mmol/L (ref 98–111)
Creatinine: 0.94 mg/dL (ref 0.44–1.00)
GFR, Estimated: >60 mL/min (ref >60)
Glucose, Bld: 197 mg/dL — ABNORMAL HIGH (ref 70–99)
Potassium: 4.3 mmol/L (ref 3.5–5.1)
Sodium: 138 mmol/L (ref 135–145)
Total Bilirubin: <0.2 mg/dL — ABNORMAL LOW (ref 0.3–1.2)
Total Protein: 7.3 g/dL (ref 6.5–8.1)

## 2021-03-14 LAB — TOTAL PROTEIN, URINE DIPSTICK: Protein, ur: NEGATIVE mg/dL

## 2021-03-14 MED ORDER — SODIUM CHLORIDE 0.9 % IV SOLN
5.0000 mg/kg | Freq: Once | INTRAVENOUS | Status: AC
  Administered 2021-03-14: 325 mg via INTRAVENOUS
  Filled 2021-03-14: qty 13

## 2021-03-14 MED ORDER — SODIUM CHLORIDE 0.9% FLUSH: 10.0000 mL | INTRAVENOUS | Status: DC | PRN

## 2021-03-14 MED ORDER — SODIUM CHLORIDE 0.9% FLUSH
10.0000 mL | Freq: Once | INTRAVENOUS | Status: AC
  Administered 2021-03-14: 10 mL

## 2021-03-14 MED ORDER — OXALIPLATIN CHEMO INJECTION 100 MG/20ML
85.0000 mg/m2 | Freq: Once | INTRAVENOUS | Status: AC
  Administered 2021-03-14: 150 mg via INTRAVENOUS
  Filled 2021-03-14: qty 20

## 2021-03-14 MED ORDER — LEUCOVORIN CALCIUM INJECTION 350 MG
400.0000 mg/m2 | Freq: Once | INTRAVENOUS | Status: AC
  Administered 2021-03-14: 704 mg via INTRAVENOUS
  Filled 2021-03-14: qty 35.2

## 2021-03-14 MED ORDER — PALONOSETRON HCL INJECTION 0.25 MG/5ML
0.2500 mg | Freq: Once | INTRAVENOUS | Status: AC
  Administered 2021-03-14: 0.25 mg via INTRAVENOUS
  Filled 2021-03-14: qty 5

## 2021-03-14 MED ORDER — SODIUM CHLORIDE 0.9 % IV SOLN
10.0000 mg | Freq: Once | INTRAVENOUS | Status: AC
  Administered 2021-03-14: 10 mg via INTRAVENOUS
  Filled 2021-03-14: qty 10

## 2021-03-14 MED ORDER — SODIUM CHLORIDE 0.9 % IV SOLN
2400.0000 mg/m2 | INTRAVENOUS | Status: DC
  Administered 2021-03-14: 4200 mg via INTRAVENOUS
  Filled 2021-03-14: qty 84

## 2021-03-14 MED ORDER — DEXTROSE 5 % IV SOLN: Freq: Once | INTRAVENOUS | Status: AC

## 2021-03-14 MED ORDER — SODIUM CHLORIDE 0.9 % IV SOLN
Freq: Once | INTRAVENOUS | Status: AC
Start: 2021-03-14 — End: 2021-03-14

## 2021-03-14 NOTE — Assessment & Plan Note (Signed)
She tolerated recent treatment very well without major side effects, except for fatigue and mild pancytopenia Her blood pressure is well controlled I recommend minimum 4 doses of chemotherapy before repeating CT imaging

## 2021-03-14 NOTE — Assessment & Plan Note (Signed)
Her blood pressure is well controlled We will continue current prescribed antihypertensives

## 2021-03-14 NOTE — Patient Instructions (Signed)
[facility]  Discharge Instructions: Thank you for choosing [facility] to provide your oncology and hematology care.   If you have a lab appointment with the [facility], please go directly to the [facility] and check in at the registration area.   Wear comfortable clothing and clothing appropriate for easy access to any Portacath or PICC line.   We strive to give you quality time with your provider. You may need to reschedule your appointment if you arrive late (15 or more minutes).  Arriving late affects you and other patients whose appointments are after yours.  Also, if you miss three or more appointments without notifying the office, you may be dismissed from the clinic at the provider's discretion.      For prescription refill requests, have your pharmacy contact our office and allow 72 hours for refills to be completed.    Today you received the following chemotherapy and/or immunotherapy agents 5FU. LEUCOVORIN, OXALIplatin, Bevacizumab.      To help prevent nausea and vomiting after your treatment, we encourage you to take your nausea medication as directed.  BELOW ARE SYMPTOMS THAT SHOULD BE REPORTED IMMEDIATELY: *FEVER GREATER THAN 100.4 F (38 C) OR HIGHER *CHILLS OR SWEATING *NAUSEA AND VOMITING THAT IS NOT CONTROLLED WITH YOUR NAUSEA MEDICATION *UNUSUAL SHORTNESS OF BREATH *UNUSUAL BRUISING OR BLEEDING *URINARY PROBLEMS (pain or burning when urinating, or frequent urination) *BOWEL PROBLEMS (unusual diarrhea, constipation, pain near the anus) TENDERNESS IN MOUTH AND THROAT WITH OR WITHOUT PRESENCE OF ULCERS (sore throat, sores in mouth, or a toothache) UNUSUAL RASH, SWELLING OR PAIN  UNUSUAL VAGINAL DISCHARGE OR ITCHING   Items with * indicate a potential emergency and should be followed up as soon as possible or go to the Emergency Department if any problems should occur.  Please show the CHEMOTHERAPY ALERT CARD or  IMMUNOTHERAPY ALERT CARD at check-in to the Emergency Department and triage nurse.  Should you have questions after your visit or need to cancel or reschedule your appointment, please contact [facility]  Dept: [phone]  and follow the prompts.  Office hours are 8:00 a.m. to 4:30 p.m. Monday - Friday. Please note that voicemails left after 4:00 p.m. may not be returned until the following business day.  We are closed weekends and major holidays. You have access to a nurse at all times for urgent questions. Please call the main number to the clinic Dept: [phone] and follow the prompts.   For any non-urgent questions, you may also contact your provider using MyChart. We now offer e-Visits for anyone 94 and older to request care online for non-urgent symptoms. For details visit [email or web address].   Also download the MyChart app! Go to the app store, search "MyChart", open the app, select [facility], and log in with your MyChart username and password.  Due to Covid, a mask is required upon entering the hospital/clinic. If you do not have a mask, one will be given to you upon arrival. For doctor visits, patients may have 1 support person aged 5 or older with them. For treatment visits, patients cannot have anyone with them due to current Covid guidelines and our immunocompromised population.

## 2021-03-14 NOTE — Progress Notes (Signed)
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   All questions were answered. The patient knows to call the clinic with any problems, questions or concerns. The total time spent in the appointment was 20 minutes encounter with patients including review of chart and various tests results, discussions about plan of care and coordination of care plan   [name], MD 03/14/2021 3:54 PM  INTERVAL HISTORY: Please see below for problem oriented charting. she returns for treatment follow-up seen prior to chemotherapy She denies worsening peripheral neuropathy Her blood pressure is reasonably controlled No recent infection, fever or chills She complains of fatigue   REVIEW OF SYSTEMS:   Constitutional: Denies fevers, chills or abnormal weight loss Eyes: Denies blurriness of vision Ears, nose, mouth, throat, and face: Denies mucositis or sore throat Respiratory: Denies cough, dyspnea or wheezes Cardiovascular: Denies palpitation, chest discomfort or lower extremity swelling Gastrointestinal:  Denies nausea, heartburn or change in bowel habits Skin: Denies abnormal skin rashes Lymphatics: Denies new lymphadenopathy or easy bruising Neurological:Denies numbness, tingling or new weaknesses Behavioral/Psych: Mood is  stable, no new changes  All other systems were reviewed with the patient and are negative.  I have reviewed the past medical history, past surgical history, social history and family history with the patient and they are unchanged from previous note.  ALLERGIES:  has No Known Allergies.  MEDICATIONS:  Current Outpatient Medications  Medication Sig Dispense Refill   acetaminophen (TYLENOL) 500 MG tablet Take 500 mg by mouth every 8 (eight) hours as needed for mild pain or moderate pain.     atenolol (TENORMIN) 25 MG tablet Take 25 mg by mouth at bedtime.      atorvastatin (LIPITOR) 20 MG tablet Take 20 mg by mouth daily.     CALCIUM-MAGNESIUM-VITAMIN D PO Take 1 tablet by mouth daily.     lidocaine-prilocaine (EMLA) cream Apply to affected area once 30 g 3   losartan (COZAAR) 50 MG tablet Take 50 mg by mouth at bedtime.     metFORMIN (GLUCOPHAGE) 1000 MG tablet Take 500 mg by mouth 2 (two) times daily.   0   Multiple Vitamin (MULTIVITAMIN WITH MINERALS) TABS tablet Take 1 tablet by mouth daily.     ondansetron (ZOFRAN) 8 MG tablet Take 1 tablet (8 mg total) by mouth every 8 (eight) hours as needed for refractory nausea / vomiting. 30 tablet 1   prochlorperazine (COMPAZINE) 10 MG tablet Take 1 tablet (10 mg total) by mouth every 6 (six) hours as needed (Nausea or vomiting). 30 tablet 1   No current facility-administered medications for this visit.   Facility-Administered Medications Ordered in Other Visits  Medication Dose Route Frequency Provider Last Rate Last Admin   fluorouracil (ADRUCIL) 4,200 mg in sodium chloride 0.9 % 66 mL chemo infusion  2,400 mg/m2 (Treatment Plan Recorded) Intravenous 1 day or 1 dose [name], MD   4,200  mg at 03/14/21 1505   sodium chloride flush (NS) 0.9 % injection 10 mL  10 mL Intracatheter PRN [name], MD        SUMMARY OF ONCOLOGIC HISTORY: Oncology History Overview Note  ER 15-20%, PR 5-10%, Her2/neu negative MMR normal Negative genetic  testing HRD pos BRCA1 positive MSI Stable   Right ovarian epithelial cancer ([place])  07/30/2017 Imaging   US pelvis Ultrasound revealed a complex cystic mass in the right adnexa measuring 20 x 11 x 12 cm with multiple internal septations some of which are thick. The left adnexa measured 12.7 x 11.6 x 8.1 with low level echoes and soft tissue nodules    07/30/2017 Tumor Marker   Patient's tumor was tested for the following markers: CA-125 Results of the tumor marker test revealed 521.3   08/17/2017 Pathology Results   The malignant cells are positive for PAX-8, cytokeratin 7, estrogen receptor, and faintly positive for GATA-3. They are negative for p53, GCDFP, and cytokeratin 20. The finding are consistent with a gynecologic primary carcinoma. Additional studies can be performed upon clinician request.   08/17/2017 Procedure   Technically successful CT-guided left lower quadrant omental mass core biopsy.   08/20/2017 PET scan   1. Cystic masses arising from the pelvis. The nodular component of the RIGHT cystic mass is intensely hypermetabolic consistent with malignant ovarian neoplasm. 2. Extensive hypermetabolic peritoneal thickening in the lower abdomen and upper pelvis, upper abdomen, and upper abdominal precordial fat and paradiaphragmatic fat. 3. Retroperitoneal nodal metastasis adjacent to the IVC at the level the kidneys. 4. No evidence of metastatic disease in the thorax other small effusion on the LEFT and nodal metastasis in the fat superior to the diaphragm. 5. Mild metabolic activity associated the distal esophagus is favored benign esophagitis.   08/20/2017 Imaging   CT chest 1. Bilateral cardiophrenic angle nodal metastasis. No additional findings to suggest metastatic disease to the chest. 2. Small left pleural effusion. 3. Peritoneal carcinomatosis noted within the abdomen. 4. Hepatic steatosis.     08/24/2017 Cancer Staging   Staging form: Ovary, Fallopian Tube, and Primary  Peritoneal Carcinoma, AJCC 8th Edition - Clinical: Stage IV (cT3, cN1, cM1) - Signed by [name], MD on 08/24/2017    08/31/2017 Tumor Marker   Patient's tumor was tested for the following markers: CA-125 Results of the tumor marker test revealed 819.9   09/26/2017 Adverse Reaction   She developed reaction to Taxol, managed successfully with additional premedications   10/17/2017 Tumor Marker   Patient's tumor was tested for the following markers: CA-125 Results of the tumor marker test revealed 374.4   10/31/2017 Imaging   1. No significant change in size of the large complex bilateral adnexal masses consistent with the known history of ovarian cancer. There is increased dependent density within the left-sided lesion. 2. Stable peritoneal carcinomatosis.  No significant ascites.  3. The bilateral pericardiac adenopathy has improved. No progressive thoracic metastatic disease. No pulmonary or osseous metastatic disease. 4. Suspicion of nonocclusive thrombus in the right internal jugular vein related to the right IJ port. Recommend further evaluation with Doppler ultrasound.   11/13/2017 Surgery   Preoperative Diagnosis: Ovarian cancer s/p neoadjuvant chemotherapy    Procedure(s) Performed:   Exploratory laparotomy  Bilateral salpingo-oophorectomy with radical tumor debulking for ovarian cancer  including retroperitoneal dissection, lysis of adhesions (enterolysis of small bowel in deep pelvis, left adnexal adhesions to sigmoid and omentum to LLQ) ~1 hour, ureterolysis. Omentectomy  Takedown of splenic flexure with removal  of omental tumor in left upper quadrant    Specimens: Bilateral tubes and ovaries, omentum, splenic flexure nodule, right paracolic gutter nodule, cecal nodule, right ureteral peritoneal nodule, small bowel mesentary.    Indication for Procedure:  Patient is s/p 3 cycles of chemotherapy with response based on CA125 and decreased mediastinal disease (to <1cm).    Operative Findings:  This represented an optimal cytoreduction with gross residual disease remaining in the deep pelvis near the levator floor and possibly in the region of the right IP ligament/periappendicial region.   Upon entry a large ~20cm right tube/ovary, mostly cystic. Adherent rind along ileocecal region and appendix. Large cystic left tube/ovary ~10cm with sigmoid colon stretched across the mass and extension of the cystic lesion into the deep pelvis with residual palpable disease deep pelvis near levators. Estimate ~1cm or less on palpation, not visible disease. Omental caking in the LLQ adherent to pelvic sidewall. Omental disease noted RUQ separate. In addition ~1.5cm lesion in splenic flexure omentum. Evidence of prior diaphragmatic disease, no visible disease.       11/13/2017 Pathology Results   1. 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 - TUMOR INVOLVES THE OVARIAN SURFACE. - RIGHT FALLOPIAN TUBE IS INVOLVED. - SEE ONCOLOGY TABLE. - SEE NOTE. 2. Soft tissue mass, biopsy, cecal gutter nodule - METASTATIC MUCINOUS ADENOCARCINOMA. 3. Ovary and fallopian tube, left, left ovary and fallopian tube - METASTATIC MUCINOUS ADENOCARCINOMA TO LEFT OVARY AND FALLOPIAN TUBE. 4. Omentum, resection for tumor - METASTATIC MUCINOUS ADENOCARCINOMA TO OMENTUM. 5. Soft tissue mass, biopsy, splenic flexure nodule - METASTATIC MUCINOUS ADENOCARCINOMA. 6. Soft tissue mass, biopsy, cecal implant - METASTATIC MUCINOUS ADENOCARCINOMA. 7. Soft tissue mass, biopsy, right ureteral peritoneal implant - METASTATIC MUCINOUS ADENOCARCINOMA. 8. Mesentery, small bowel nodule - METASTATIC MUCINOUS ADENOCARCINOMA. Microscopic Comment 1. OVARY or FALLOPIAN TUBE or PRIMARY PERITONEUM: Procedure: Bilateral salpingo-oophorectomy. Specimen Integrity: Intact. Tumor Site: Right ovary. Ovarian Surface Involvement: Present. Fallopian Tube Surface  Involvement: Present. Tumor Size: 20 cm. Histologic Type: Mucinous adenocarcinoma. Histologic Grade: Overall G2 (moderately differentiated) (focal areas of poor differentiation are present). Implants: Present. Other Tissue/ Organ Involvement: Left ovary, left fallopian tube and omentum. Largest Extrapelvic Peritoneal Focus: less than 2 cm. See note Peritoneal/Ascitic Fluid: N/A Treatment Effect: No definite or minimal response identified (chemotherapy response score 1 [CRS 1]) Regional Lymph Nodes No lymph nodes submitted or found Number of Lymph Nodes Examined: 0 Pathologic Stage Classification (pTNM, AJCC 8th Edition): ypT3b, ypNX. Representative Tumor Block: 1D and 1E. (NDK:gt, 11/15/17)   11/19/2017 Genetic Testing   Negative genetic testing on the [employer] Myrisk panel.  The [facility] gene panel offered by [employer] includes sequencing and deletion/duplication testing of the following 35 genes: APC, ATM, AXIN2, BARD1, BMPR1A, BRCA1, BRCA2, BRIP1, CHD1, CDK4, CDKN2A, CHEK2, EPCAM (large rearrangement only), HOXB13, (sequencing only), GALNT12, MLH1, MSH2, MSH3 (excluding repetitive portions of exon 1), MSH6, MUTYH, NBN, NTHL1, PALB2, PMS2, PTEN, RAD51C, RAD51D, RNF43, RPS20, SMAD4, STK11, and TP53. Sequencing was performed for select regions of POLE and POLD1, and large rearrangement analysis was performed for select regions of GREM1. The report date is November 19, 2017.  HRD tumor results indicate a BRCA1 mutation identified in the ovarian tumor causing genomic instability. The report date is 12/04/2017     Genetic Testing   Patient has genetic testing done for ER/PR and Her2/neu. Results revealed patient has the following: ER 15-20% PR 5-10% Her2/neu - negative    Genetic Testing  Patient has genetic testing done for MSI/MMR. Results revealed patient has the following mutation(s): MMR normal, MSI stable   12/10/2017 Imaging   1. Interval resection of large bilateral  adnexal cystic masses. Residual soft tissue/tumor within bilateral adnexal regions as above. 2. Interval resolution of previously noted omental cake which may reflect interval omentectomy. There is a new peritoneal deposit identified within the left upper quadrant of the abdomen involving the anterior surface of the spleen.   12/10/2017 Tumor Marker   Patient's tumor was tested for the following markers: CA-125 Results of the tumor marker test revealed 94.9   12/11/2017 - 02/20/2018 Chemotherapy   The patient had FOLFOX regimen for mucinous   01/09/2018 Tumor Marker   Patient's tumor was tested for the following markers: CA-125 Results of the tumor marker test revealed 37   01/31/2018 Imaging   Resolution of peritoneal soft tissue density along the anterior margin of the spleen since prior study. No evidence of residual or progressive metastatic disease.  Mild sigmoid diverticulosis, without radiographic evidence of diverticulitis. Resolution of small pericolonic abscess along the left lateral pelvic sidewall.  Mild hepatic steatosis.   02/06/2018 Tumor Marker   Patient's tumor was tested for the following markers: CA-125 Results of the tumor marker test revealed 36.7   03/06/2018 -  Chemotherapy   The patient is started on [name] as PARP maintenance   03/13/2018 Tumor Marker   Patient's tumor was tested for the following markers: CA-125 Results of the tumor marker test revealed 33.8   04/05/2018 Tumor Marker   Patient's tumor was tested for the following markers: CA-125 Results of the tumor marker test revealed 27.3   05/03/2018 Tumor Marker   Patient's tumor was tested for the following markers: CA-125 Results of the tumor marker test revealed 23.5   07/08/2018 Tumor Marker   Patient's tumor was tested for the following markers: CA-125 Results of the tumor marker test revealed 19.9   09/02/2018 Tumor Marker   Patient's tumor was tested for the following markers:  CA-125 Results of the tumor marker test revealed 17   10/28/2018 Imaging   1. 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 Repeat abdominal MRI with and without IV gadolinium with MRCP is recommended in 2 years to ensure continued stability. This recommendation follows ACR consensus guidelines: Management of Incidental Pancreatic Cysts: A White Paper of the ACR Incidental Findings Committee. [place] 7510;[id number]. 2. Hepatic steatosis. 3. Additional incidental findings, as above.     10/28/2018 Tumor Marker   Patient's tumor was tested for the following markers: CA-125 Results of the tumor marker test revealed 16.9   03/03/2019 Tumor Marker   Patient's tumor was tested for the following markers: CA-125 Results of the tumor marker test revealed 12.7.   07/09/2019 Tumor Marker   Patient's tumor was tested for the following markers: CA-125 Results of the tumor marker test revealed 9.5.   09/02/2019 Tumor Marker   Patient's tumor was tested for the following markers: CA-125 Results of the tumor marker test revealed 11.   10/28/2019 Tumor Marker   Patient's tumor was tested for the following markers: CA-125 Results of the tumor marker test revealed 10.2   12/23/2019 Tumor Marker   Patient's tumor was tested for the following markers: CA-125 Results of the tumor marker test revealed 12.1   02/17/2020 Tumor Marker   Patient's tumor was tested  for the following markers: CA-125 Results of the tumor marker test revealed 9.5   05/25/2020 Tumor Marker   Patient's tumor was tested for the following markers: CA-125 Results of the tumor marker test revealed 9.6   07/13/2020 Tumor Marker   Patient's tumor was tested for the following markers: CA-125 Results of the tumor marker test revealed 10.   11/01/2020 Tumor Marker   Patient's tumor was tested for the  following markers: CA-125. Results of the tumor marker test revealed 18.1.   11/01/2020 Imaging   1. Cystic pancreatic lesions, potentially small intraductal papillary mucinous neoplasm. Potential slight increase in size of 1 of these areas in particular near the tail of the pancreas with perhaps mild increase in ventral duct dilation but no main pancreatic duct dilation. Given patient history consider six-month follow-up to assess for stability. 7 mm as the largest area with potential increase in size from 3 mm. 2. Hepatic steatosis. 3. Postoperative changes in the midline of the abdomen.   01/25/2021 Tumor Marker   Patient's tumor was tested for the following markers: CA-125. Results of the tumor marker test revealed 220.   02/03/2021 Imaging   Increased peritoneal soft tissue thickening and nodularity within the lower abdomen and pelvis, with minimal ascites. This is consistent with progressive peritoneal carcinomatosis.   New small ventral abdominal wall hernias.   Colonic diverticulosis. No radiographic evidence of diverticulitis.   Aortic Atherosclerosis (ICD10-I70.0).   02/14/2021 -  Chemotherapy   Patient is on Treatment Plan : Ovarian FOLFOX + Bevacizumab q14d     02/14/2021 Tumor Marker   Patient's tumor was tested for the following markers: CA-125. Results of the tumor marker test revealed 524.     PHYSICAL EXAMINATION: ECOG PERFORMANCE STATUS: 1 - Symptomatic but completely ambulatory  Vitals:   03/14/21 1010  BP: 135/73  Pulse: 68  Resp: 18  Temp: 98.1 F (36.7 C)  SpO2: 99%   Filed Weights   03/14/21 1010  Weight: 148 lb 12.8 oz (67.5 kg)    GENERAL:alert, no distress and comfortable SKIN: skin color, texture, turgor are normal, no rashes or significant lesions EYES: normal, Conjunctiva are pink and non-injected, sclera clear OROPHARYNX:no exudate, no erythema and lips, buccal mucosa, and tongue normal  NECK: supple, thyroid normal size, non-tender,  without nodularity LYMPH:  no palpable lymphadenopathy in the cervical, axillary or inguinal LUNGS: clear to auscultation and percussion with normal breathing effort HEART: regular rate & rhythm and no murmurs and no lower extremity edema ABDOMEN:abdomen soft, non-tender and normal bowel sounds Musculoskeletal:no cyanosis of digits and no clubbing  NEURO: alert & oriented x 3 with fluent speech, no focal motor/sensory deficits  LABORATORY DATA:  I have reviewed the data as listed    Component Value Date/Time   NA 138 03/14/2021 0925   K 4.3 03/14/2021 0925   CL 105 03/14/2021 0925   CO2 25 03/14/2021 0925   GLUCOSE 197 (H) 03/14/2021 0925   BUN 13 03/14/2021 0925   CREATININE 0.94 03/14/2021 0925   CREATININE 0.86 06/05/2014 0957   CALCIUM 9.1 03/14/2021 0925   PROT 7.3 03/14/2021 0925   ALBUMIN 3.5 03/14/2021 0925   AST 24 03/14/2021 0925   ALT 21 03/14/2021 0925   ALKPHOS 65 03/14/2021 0925   BILITOT <0.2 (L) 03/14/2021 0925   GFRNONAA >60 03/14/2021 0925   GFRAA >60 12/23/2019 0954   GFRAA >60 04/29/2019 0915    No results found for: SPEP, UPEP  Lab Results  Component Value Date  WBC 3.2 (L) 03/14/2021   NEUTROABS 0.8 (L) 03/14/2021   HGB 11.2 (L) 03/14/2021   HCT 34.4 (L) 03/14/2021   MCV 104.9 (H) 03/14/2021   PLT 171 03/14/2021      Chemistry      Component Value Date/Time   NA 138 03/14/2021 0925   K 4.3 03/14/2021 0925   CL 105 03/14/2021 0925   CO2 25 03/14/2021 0925   BUN 13 03/14/2021 0925   CREATININE 0.94 03/14/2021 0925   CREATININE 0.86 06/05/2014 0957      Component Value Date/Time   CALCIUM 9.1 03/14/2021 0925   ALKPHOS 65 03/14/2021 0925   AST 24 03/14/2021 0925   ALT 21 03/14/2021 0925   BILITOT <0.2 (L) 03/14/2021 9038

## 2021-03-14 NOTE — Assessment & Plan Note (Signed)
She has mild acquired pancytopenia due to treatment but not symptomatic We will proceed with treatment without delay

## 2021-03-15 ENCOUNTER — Telehealth: Payer: Self-pay

## 2021-03-15 ENCOUNTER — Other Ambulatory Visit: Payer: Self-pay | Admitting: Hematology and Oncology

## 2021-03-15 LAB — CA 125: Cancer Antigen (CA) 125: 356 U/mL — ABNORMAL HIGH (ref 0.0–38.1)

## 2021-03-15 NOTE — Telephone Encounter (Signed)
Spoke with patient regarding recent lab results. Patient appreciative of the call.  Answered questions and educated on nausea prevention and symptom management. Patient verbalized an understanding of the education.

## 2021-03-15 NOTE — Telephone Encounter (Signed)
-----   Message from [name], MD sent at 03/15/2021  8:09 AM EST ----- Regarding: CA-125 Pls let her know results are better

## 2021-03-16 ENCOUNTER — Inpatient Hospital Stay: Payer: Medicare HMO

## 2021-03-16 ENCOUNTER — Other Ambulatory Visit: Payer: Self-pay

## 2021-03-16 VITALS — BP 117/67 | HR 83 | Temp 99.5°F | Resp 18

## 2021-03-16 DIAGNOSIS — C561 Malignant neoplasm of right ovary: Secondary | ICD-10-CM

## 2021-03-16 DIAGNOSIS — Z5112 Encounter for antineoplastic immunotherapy: Secondary | ICD-10-CM | POA: Diagnosis not present

## 2021-03-16 DIAGNOSIS — R590 Localized enlarged lymph nodes: Secondary | ICD-10-CM

## 2021-03-16 MED ORDER — SODIUM CHLORIDE 0.9% FLUSH
10.0000 mL | INTRAVENOUS | Status: DC | PRN
  Administered 2021-03-16: 10 mL

## 2021-03-16 MED ORDER — HEPARIN SOD (PORK) LOCK FLUSH 100 UNIT/ML IV SOLN
500.0000 [IU] | Freq: Once | INTRAVENOUS | Status: AC | PRN
  Administered 2021-03-16: 500 [IU]

## 2021-03-22 ENCOUNTER — Other Ambulatory Visit: Payer: Medicare HMO

## 2021-03-28 MED FILL — Dexamethasone Sodium Phosphate Inj 100 MG/10ML: INTRAMUSCULAR | Qty: 1 | Status: AC

## 2021-03-29 ENCOUNTER — Encounter: Payer: Self-pay | Admitting: Hematology and Oncology

## 2021-03-29 ENCOUNTER — Inpatient Hospital Stay: Payer: Medicare HMO | Attending: Hematology and Oncology

## 2021-03-29 ENCOUNTER — Inpatient Hospital Stay: Payer: Medicare HMO | Admitting: Hematology and Oncology

## 2021-03-29 ENCOUNTER — Inpatient Hospital Stay: Payer: Medicare HMO

## 2021-03-29 ENCOUNTER — Other Ambulatory Visit: Payer: Self-pay

## 2021-03-29 VITALS — BP 129/71 | HR 95

## 2021-03-29 DIAGNOSIS — C563 Malignant neoplasm of bilateral ovaries: Secondary | ICD-10-CM | POA: Diagnosis present

## 2021-03-29 DIAGNOSIS — R112 Nausea with vomiting, unspecified: Secondary | ICD-10-CM

## 2021-03-29 DIAGNOSIS — Z90722 Acquired absence of ovaries, bilateral: Secondary | ICD-10-CM | POA: Insufficient documentation

## 2021-03-29 DIAGNOSIS — D61818 Other pancytopenia: Secondary | ICD-10-CM | POA: Insufficient documentation

## 2021-03-29 DIAGNOSIS — Z9079 Acquired absence of other genital organ(s): Secondary | ICD-10-CM | POA: Diagnosis not present

## 2021-03-29 DIAGNOSIS — Z79899 Other long term (current) drug therapy: Secondary | ICD-10-CM | POA: Insufficient documentation

## 2021-03-29 DIAGNOSIS — C561 Malignant neoplasm of right ovary: Secondary | ICD-10-CM

## 2021-03-29 DIAGNOSIS — C786 Secondary malignant neoplasm of retroperitoneum and peritoneum: Secondary | ICD-10-CM | POA: Diagnosis not present

## 2021-03-29 DIAGNOSIS — Z5111 Encounter for antineoplastic chemotherapy: Secondary | ICD-10-CM | POA: Insufficient documentation

## 2021-03-29 DIAGNOSIS — R590 Localized enlarged lymph nodes: Secondary | ICD-10-CM

## 2021-03-29 DIAGNOSIS — Z5112 Encounter for antineoplastic immunotherapy: Secondary | ICD-10-CM | POA: Diagnosis not present

## 2021-03-29 DIAGNOSIS — G5601 Carpal tunnel syndrome, right upper limb: Secondary | ICD-10-CM | POA: Diagnosis not present

## 2021-03-29 DIAGNOSIS — Z7984 Long term (current) use of oral hypoglycemic drugs: Secondary | ICD-10-CM | POA: Diagnosis not present

## 2021-03-29 DIAGNOSIS — G629 Polyneuropathy, unspecified: Secondary | ICD-10-CM | POA: Insufficient documentation

## 2021-03-29 LAB — CBC WITH DIFFERENTIAL (CANCER CENTER ONLY)
Abs Immature Granulocytes: 0.07 10*3/uL (ref 0.00–0.07)
Basophils Absolute: 0 10*3/uL (ref 0.0–0.1)
Basophils Relative: 1 %
Eosinophils Absolute: 0.2 10*3/uL (ref 0.0–0.5)
Eosinophils Relative: 5 %
HCT: 34.4 % — ABNORMAL LOW (ref 36.0–46.0)
Hemoglobin: 11.3 g/dL — ABNORMAL LOW (ref 12.0–15.0)
Immature Granulocytes: 2 %
Lymphocytes Relative: 43 %
Lymphs Abs: 1.7 10*3/uL (ref 0.7–4.0)
MCH: 34 pg (ref 26.0–34.0)
MCHC: 32.8 g/dL (ref 30.0–36.0)
MCV: 103.6 fL — ABNORMAL HIGH (ref 80.0–100.0)
Monocytes Absolute: 0.9 10*3/uL (ref 0.1–1.0)
Monocytes Relative: 24 %
Neutro Abs: 1 10*3/uL — ABNORMAL LOW (ref 1.7–7.7)
Neutrophils Relative %: 25 %
Platelet Count: 228 10*3/uL (ref 150–400)
RBC: 3.32 MIL/uL — ABNORMAL LOW (ref 3.87–5.11)
RDW: 15.2 % (ref 11.5–15.5)
Smear Review: NORMAL
WBC Count: 3.9 10*3/uL — ABNORMAL LOW (ref 4.0–10.5)
nRBC: 0 % (ref 0.0–0.2)

## 2021-03-29 LAB — CMP (CANCER CENTER ONLY)
ALT: 16 U/L (ref 0–44)
AST: 21 U/L (ref 15–41)
Albumin: 3.3 g/dL — ABNORMAL LOW (ref 3.5–5.0)
Alkaline Phosphatase: 62 U/L (ref 38–126)
Anion gap: 11 (ref 5–15)
BUN: 14 mg/dL (ref 8–23)
CO2: 22 mmol/L (ref 22–32)
Calcium: 9 mg/dL (ref 8.9–10.3)
Chloride: 102 mmol/L (ref 98–111)
Creatinine: 0.94 mg/dL (ref 0.44–1.00)
GFR, Estimated: >60 mL/min (ref >60)
Glucose, Bld: 151 mg/dL — ABNORMAL HIGH (ref 70–99)
Potassium: 4.2 mmol/L (ref 3.5–5.1)
Sodium: 135 mmol/L (ref 135–145)
Total Bilirubin: 0.6 mg/dL (ref 0.3–1.2)
Total Protein: 7.7 g/dL (ref 6.5–8.1)

## 2021-03-29 LAB — TOTAL PROTEIN, URINE DIPSTICK: Protein, ur: NEGATIVE mg/dL

## 2021-03-29 MED ORDER — HEPARIN SOD (PORK) LOCK FLUSH 100 UNIT/ML IV SOLN: 500.0000 [IU] | Freq: Once | INTRAVENOUS | Status: DC | PRN

## 2021-03-29 MED ORDER — SODIUM CHLORIDE 0.9 % IV SOLN
2400.0000 mg/m2 | INTRAVENOUS | Status: DC
  Administered 2021-03-29: 4200 mg via INTRAVENOUS
  Filled 2021-03-29: qty 84

## 2021-03-29 MED ORDER — DEXTROSE 5 % IV SOLN: Freq: Once | INTRAVENOUS | Status: AC

## 2021-03-29 MED ORDER — LEUCOVORIN CALCIUM INJECTION 350 MG
400.0000 mg/m2 | Freq: Once | INTRAVENOUS | Status: AC
  Administered 2021-03-29: 704 mg via INTRAVENOUS
  Filled 2021-03-29: qty 35.2

## 2021-03-29 MED ORDER — SODIUM CHLORIDE 0.9 % IV SOLN
10.0000 mg | Freq: Once | INTRAVENOUS | Status: AC
  Administered 2021-03-29: 10 mg via INTRAVENOUS
  Filled 2021-03-29: qty 10

## 2021-03-29 MED ORDER — LORAZEPAM 2 MG/ML IJ SOLN
1.0000 mg | Freq: Once | INTRAMUSCULAR | Status: AC
  Administered 2021-03-29: 1 mg via INTRAVENOUS
  Filled 2021-03-29: qty 1

## 2021-03-29 MED ORDER — SODIUM CHLORIDE 0.9% FLUSH: 10.0000 mL | INTRAVENOUS | Status: DC | PRN

## 2021-03-29 MED ORDER — OXALIPLATIN CHEMO INJECTION 100 MG/20ML
85.0000 mg/m2 | Freq: Once | INTRAVENOUS | Status: AC
  Administered 2021-03-29: 150 mg via INTRAVENOUS
  Filled 2021-03-29: qty 20

## 2021-03-29 MED ORDER — SODIUM CHLORIDE 0.9 % IV SOLN
5.0000 mg/kg | Freq: Once | INTRAVENOUS | Status: AC
  Administered 2021-03-29: 325 mg via INTRAVENOUS
  Filled 2021-03-29: qty 13

## 2021-03-29 MED ORDER — SODIUM CHLORIDE 0.9 % IV SOLN: Freq: Once | INTRAVENOUS | Status: AC

## 2021-03-29 MED ORDER — PALONOSETRON HCL INJECTION 0.25 MG/5ML
0.2500 mg | Freq: Once | INTRAVENOUS | Status: AC
  Administered 2021-03-29: 0.25 mg via INTRAVENOUS
  Filled 2021-03-29: qty 5

## 2021-03-29 MED ORDER — SODIUM CHLORIDE 0.9% FLUSH
10.0000 mL | Freq: Once | INTRAVENOUS | Status: AC
  Administered 2021-03-29: 10 mL

## 2021-03-29 NOTE — Progress Notes (Signed)
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[facility]) She tolerated recent treatment very well without major side effects, except for fatigue and mild pancytopenia Her blood pressure is well controlled I recommend minimum 6 doses of chemotherapy before repeating CT imaging  Pancytopenia, acquired ([place]) She has mild acquired pancytopenia due to treatment but not symptomatic We will proceed with treatment without delay  Nausea with vomiting She has recurrent nausea and vomiting within 2 hours after completion of treatment in the last few treatment It appears almost she has anticipatory nausea I recommend low-dose lorazepam today to see if that would help If not, I will get insurance authorization to add Emend in her future treatment  No orders of the defined types were placed in this encounter.   All questions were answered. The patient knows to call the clinic with any problems, questions or concerns. The total time spent in the appointment was 30 minutes encounter with patients including review of chart and various tests results, discussions about plan of care and coordination of care plan   Sabrina Lark, MD 03/29/2021 2:18 PM  INTERVAL HISTORY: Please see below for problem oriented charting. she returns for treatment follow-up to be seen prior to cycle 4 FOLFOX treatment Within 30 minutes to 2 hours after completion of treatment, she had profound nausea and vomiting That resolved with Compazine She has noticed some mild neuropathy but not worse No recent infection, fever or chills  REVIEW OF SYSTEMS:   Constitutional: Denies fevers, chills or abnormal weight loss Eyes: Denies blurriness of vision Ears, nose, mouth, throat, and face: Denies mucositis or sore throat Respiratory: Denies cough, dyspnea or wheezes Cardiovascular: Denies palpitation, chest  discomfort or lower extremity swelling Skin: Denies abnormal skin rashes Lymphatics: Denies new lymphadenopathy or easy bruising Behavioral/Psych: Mood is stable, no new changes  All other systems were reviewed with the patient and are negative.  I have reviewed the past medical history, past surgical history, social history and family history with the patient and they are unchanged from previous note.  ALLERGIES:  has No Known Allergies.  MEDICATIONS:  Current Outpatient Medications  Medication Sig Dispense Refill   acetaminophen (TYLENOL) 500 MG tablet Take 500 mg by mouth every 8 (eight) hours as needed for mild pain or moderate pain.     atenolol (TENORMIN) 25 MG tablet Take 25 mg by mouth at bedtime.      atorvastatin (LIPITOR) 20 MG tablet Take 20 mg by mouth daily.     CALCIUM-MAGNESIUM-VITAMIN D PO Take 1 tablet by mouth daily.     lidocaine-prilocaine (EMLA) cream Apply to affected area once 30 g 3   losartan (COZAAR) 50 MG tablet Take 50 mg by mouth at bedtime.     metFORMIN (GLUCOPHAGE) 1000 MG tablet Take 500 mg by mouth 2 (two) times daily.   0   Multiple Vitamin (MULTIVITAMIN WITH MINERALS) TABS tablet Take 1 tablet by mouth daily.     ondansetron (ZOFRAN) 8 MG tablet Take 1 tablet (8 mg total) by mouth every 8 (eight) hours as needed for refractory nausea / vomiting. 30 tablet 1   prochlorperazine (COMPAZINE) 10 MG tablet Take 1 tablet (10 mg total) by mouth every 6 (six) hours as needed (Nausea or vomiting). 30 tablet 1   No current facility-administered medications for this visit.   Facility-Administered Medications Ordered in Other Visits  Medication  Dose Route Frequency Provider Last Rate Last Admin   fluorouracil (ADRUCIL) 4,200 mg in sodium chloride 0.9 % 66 mL chemo infusion  2,400 mg/m2 (Treatment Plan Recorded) Intravenous 1 day or 1 dose Alvy Bimler, Barbee Mamula, MD       heparin lock flush 100 unit/mL  500 Units Intracatheter Once PRN Alvy Bimler, Nilah Belcourt, MD       leucovorin 704 mg  in dextrose 5 % 250 mL infusion  400 mg/m2 (Treatment Plan Recorded) Intravenous Once Sabrina Lark, MD 143 mL/hr at 03/29/21 1352 704 mg at 03/29/21 1352   oxaliplatin (ELOXATIN) 150 mg in dextrose 5 % 500 mL chemo infusion  85 mg/m2 (Treatment Plan Recorded) Intravenous Once Sabrina Lark, MD 265 mL/hr at 03/29/21 1349 150 mg at 03/29/21 1349   sodium chloride flush (NS) 0.9 % injection 10 mL  10 mL Intracatheter PRN Sabrina Lark, MD        SUMMARY OF ONCOLOGIC HISTORY: Oncology History Overview Note  ER 15-20%, PR 5-10%, Her2/neu negative MMR normal Negative genetic testing HRD pos BRCA1 positive MSI Stable   Right ovarian epithelial cancer ([place])  07/30/2017 Imaging   US pelvis Ultrasound revealed a complex cystic mass in the right adnexa measuring 20 x 11 x 12 cm with multiple internal septations some of which are thick. The left adnexa measured 12.7 x 11.6 x 8.1 with low level echoes and soft tissue nodules    07/30/2017 Tumor Marker   Patient's tumor was tested for the following markers: CA-125 Results of the tumor marker test revealed 521.3   08/17/2017 Pathology Results   The malignant cells are positive for PAX-8, cytokeratin 7, estrogen receptor, and faintly positive for GATA-3. They are negative for p53, GCDFP, and cytokeratin 20. The finding are consistent with a gynecologic primary carcinoma. Additional studies can be performed upon clinician request.   08/17/2017 Procedure   Technically successful CT-guided left lower quadrant omental mass core biopsy.   08/20/2017 PET scan   1. Cystic masses arising from the pelvis. The nodular component of the RIGHT cystic mass is intensely hypermetabolic consistent with malignant ovarian neoplasm. 2. Extensive hypermetabolic peritoneal thickening in the lower abdomen and upper pelvis, upper abdomen, and upper abdominal precordial fat and paradiaphragmatic fat. 3. Retroperitoneal nodal metastasis adjacent to the IVC at the level the kidneys. 4.  No evidence of metastatic disease in the thorax other small effusion on the LEFT and nodal metastasis in the fat superior to the diaphragm. 5. Mild metabolic activity associated the distal esophagus is favored benign esophagitis.   08/20/2017 Imaging   CT chest 1. Bilateral cardiophrenic angle nodal metastasis. No additional findings to suggest metastatic disease to the chest. 2. Small left pleural effusion. 3. Peritoneal carcinomatosis noted within the abdomen. 4. Hepatic steatosis.     08/24/2017 Cancer Staging   Staging form: Ovary, Fallopian Tube, and Primary Peritoneal Carcinoma, AJCC 8th Edition - Clinical: Stage IV (cT3, cN1, cM1) - Signed by Sabrina Lark, MD on 08/24/2017    08/31/2017 Tumor Marker   Patient's tumor was tested for the following markers: CA-125 Results of the tumor marker test revealed 819.9   09/26/2017 Adverse Reaction   She developed reaction to Taxol, managed successfully with additional premedications   10/17/2017 Tumor Marker   Patient's tumor was tested for the following markers: CA-125 Results of the tumor marker test revealed 374.4   10/31/2017 Imaging   1. No significant change in size of the large complex bilateral adnexal masses consistent with the known history of ovarian  cancer. There is increased dependent density within the left-sided lesion. 2. Stable peritoneal carcinomatosis.  No significant ascites.  3. The bilateral pericardiac adenopathy has improved. No progressive thoracic metastatic disease. No pulmonary or osseous metastatic disease. 4. Suspicion of nonocclusive thrombus in the right internal jugular vein related to the right IJ port. Recommend further evaluation with Doppler ultrasound.   11/13/2017 Surgery   Preoperative Diagnosis: Ovarian cancer s/p neoadjuvant chemotherapy    Procedure(s) Performed:   Exploratory laparotomy  Bilateral salpingo-oophorectomy with radical tumor debulking for ovarian cancer  including retroperitoneal  dissection, lysis of adhesions (enterolysis of small bowel in deep pelvis, left adnexal adhesions to sigmoid and omentum to LLQ) ~1 hour, ureterolysis. Omentectomy  Takedown of splenic flexure with removal of omental tumor in left upper quadrant    Specimens: Bilateral tubes and ovaries, omentum, splenic flexure nodule, right paracolic gutter nodule, cecal nodule, right ureteral peritoneal nodule, small bowel mesentary.    Indication for Procedure:  Patient is s/p 3 cycles of chemotherapy with response based on CA125 and decreased mediastinal disease (to <1cm).   Operative Findings:  This represented an optimal cytoreduction with gross residual disease remaining in the deep pelvis near the levator floor and possibly in the region of the right IP ligament/periappendicial region.   Upon entry a large ~20cm right tube/ovary, mostly cystic. Adherent rind along ileocecal region and appendix. Large cystic left tube/ovary ~10cm with sigmoid colon stretched across the mass and extension of the cystic lesion into the deep pelvis with residual palpable disease deep pelvis near levators. Estimate ~1cm or less on palpation, not visible disease. Omental caking in the LLQ adherent to pelvic sidewall. Omental disease noted RUQ separate. In addition ~1.5cm lesion in splenic flexure omentum. Evidence of prior diaphragmatic disease, no visible disease.       11/13/2017 Pathology Results   1. 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 - TUMOR INVOLVES THE OVARIAN SURFACE. - RIGHT FALLOPIAN TUBE IS INVOLVED. - SEE ONCOLOGY TABLE. - SEE NOTE. 2. Soft tissue mass, biopsy, cecal gutter nodule - METASTATIC MUCINOUS ADENOCARCINOMA. 3. Ovary and fallopian tube, left, left ovary and fallopian tube - METASTATIC MUCINOUS ADENOCARCINOMA TO LEFT OVARY AND FALLOPIAN TUBE. 4. Omentum, resection for tumor - METASTATIC MUCINOUS ADENOCARCINOMA TO OMENTUM. 5. Soft tissue  mass, biopsy, splenic flexure nodule - METASTATIC MUCINOUS ADENOCARCINOMA. 6. Soft tissue mass, biopsy, cecal implant - METASTATIC MUCINOUS ADENOCARCINOMA. 7. Soft tissue mass, biopsy, right ureteral peritoneal implant - METASTATIC MUCINOUS ADENOCARCINOMA. 8. Mesentery, small bowel nodule - METASTATIC MUCINOUS ADENOCARCINOMA. Microscopic Comment 1. OVARY or FALLOPIAN TUBE or PRIMARY PERITONEUM: Procedure: Bilateral salpingo-oophorectomy. Specimen Integrity: Intact. Tumor Site: Right ovary. Ovarian Surface Involvement: Present. Fallopian Tube Surface Involvement: Present. Tumor Size: 20 cm. Histologic Type: Mucinous adenocarcinoma. Histologic Grade: Overall G2 (moderately differentiated) (focal areas of poor differentiation are present). Implants: Present. Other Tissue/ Organ Involvement: Left ovary, left fallopian tube and omentum. Largest Extrapelvic Peritoneal Focus: less than 2 cm. See note Peritoneal/Ascitic Fluid: N/A Treatment Effect: No definite or minimal response identified (chemotherapy response score 1 [CRS 1]) Regional Lymph Nodes No lymph nodes submitted or found Number of Lymph Nodes Examined: 0 Pathologic Stage Classification (pTNM, AJCC 8th Edition): ypT3b, ypNX. Representative Tumor Block: 1D and 1E. (NDK:gt, 11/15/17)   11/19/2017 Genetic Testing   Negative genetic testing on the [employer] Myrisk panel.  The [facility] gene panel offered by [employer] includes sequencing and deletion/duplication testing of the following 35 genes: APC, ATM,  AXIN2, BARD1, BMPR1A, BRCA1, BRCA2, BRIP1, CHD1, CDK4, CDKN2A, CHEK2, EPCAM (large rearrangement only), HOXB13, (sequencing only), GALNT12, MLH1, MSH2, MSH3 (excluding repetitive portions of exon 1), MSH6, MUTYH, NBN, NTHL1, PALB2, PMS2, PTEN, RAD51C, RAD51D, RNF43, RPS20, SMAD4, STK11, and TP53. Sequencing was performed for select regions of POLE and POLD1, and large rearrangement analysis was performed for select regions  of GREM1. The report date is November 19, 2017.  HRD tumor results indicate a BRCA1 mutation identified in the ovarian tumor causing genomic instability. The report date is 12/04/2017     Genetic Testing   Patient has genetic testing done for ER/PR and Her2/neu. Results revealed patient has the following: ER 15-20% PR 5-10% Her2/neu - negative    Genetic Testing   Patient has genetic testing done for MSI/MMR. Results revealed patient has the following mutation(s): MMR normal, MSI stable   12/10/2017 Imaging   1. Interval resection of large bilateral adnexal cystic masses. Residual soft tissue/tumor within bilateral adnexal regions as above. 2. Interval resolution of previously noted omental cake which may reflect interval omentectomy. There is a new peritoneal deposit identified within the left upper quadrant of the abdomen involving the anterior surface of the spleen.   12/10/2017 Tumor Marker   Patient's tumor was tested for the following markers: CA-125 Results of the tumor marker test revealed 94.9   12/11/2017 - 02/20/2018 Chemotherapy   The patient had FOLFOX regimen for mucinous   01/09/2018 Tumor Marker   Patient's tumor was tested for the following markers: CA-125 Results of the tumor marker test revealed 37   01/31/2018 Imaging   Resolution of peritoneal soft tissue density along the anterior margin of the spleen since prior study. No evidence of residual or progressive metastatic disease.  Mild sigmoid diverticulosis, without radiographic evidence of diverticulitis. Resolution of small pericolonic abscess along the left lateral pelvic sidewall.  Mild hepatic steatosis.   02/06/2018 Tumor Marker   Patient's tumor was tested for the following markers: CA-125 Results of the tumor marker test revealed 36.7   03/06/2018 -  Chemotherapy   The patient is started on [name] as PARP maintenance   03/13/2018 Tumor Marker   Patient's tumor was tested for the following markers:  CA-125 Results of the tumor marker test revealed 33.8   04/05/2018 Tumor Marker   Patient's tumor was tested for the following markers: CA-125 Results of the tumor marker test revealed 27.3   05/03/2018 Tumor Marker   Patient's tumor was tested for the following markers: CA-125 Results of the tumor marker test revealed 23.5   07/08/2018 Tumor Marker   Patient's tumor was tested for the following markers: CA-125 Results of the tumor marker test revealed 19.9   09/02/2018 Tumor Marker   Patient's tumor was tested for the following markers: CA-125 Results of the tumor marker test revealed 17   10/28/2018 Imaging   1. 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 Repeat abdominal MRI with and without IV gadolinium with MRCP is recommended in 2 years to ensure continued stability. This recommendation follows ACR consensus guidelines: Management of Incidental Pancreatic Cysts: A White Paper of the ACR Incidental Findings Committee. [place] [id number]. 2. Hepatic steatosis. 3. Additional incidental findings, as above.     10/28/2018 Tumor Marker   Patient's tumor was tested for the following markers: CA-125 Results of the tumor marker test revealed 16.9   03/03/2019  Tumor Marker   Patient's tumor was tested for the following markers: CA-125 Results of the tumor marker test revealed 12.7.   07/09/2019 Tumor Marker   Patient's tumor was tested for the following markers: CA-125 Results of the tumor marker test revealed 9.5.   09/02/2019 Tumor Marker   Patient's tumor was tested for the following markers: CA-125 Results of the tumor marker test revealed 11.   10/28/2019 Tumor Marker   Patient's tumor was tested for the following markers: CA-125 Results of the tumor marker test revealed 10.2   12/23/2019 Tumor Marker   Patient's tumor was tested for  the following markers: CA-125 Results of the tumor marker test revealed 12.1   02/17/2020 Tumor Marker   Patient's tumor was tested for the following markers: CA-125 Results of the tumor marker test revealed 9.5   05/25/2020 Tumor Marker   Patient's tumor was tested for the following markers: CA-125 Results of the tumor marker test revealed 9.6   07/13/2020 Tumor Marker   Patient's tumor was tested for the following markers: CA-125 Results of the tumor marker test revealed 10.   11/01/2020 Tumor Marker   Patient's tumor was tested for the following markers: CA-125. Results of the tumor marker test revealed 18.1.   11/01/2020 Imaging   1. Cystic pancreatic lesions, potentially small intraductal papillary mucinous neoplasm. Potential slight increase in size of 1 of these areas in particular near the tail of the pancreas with perhaps mild increase in ventral duct dilation but no main pancreatic duct dilation. Given patient history consider six-month follow-up to assess for stability. 7 mm as the largest area with potential increase in size from 3 mm. 2. Hepatic steatosis. 3. Postoperative changes in the midline of the abdomen.   01/25/2021 Tumor Marker   Patient's tumor was tested for the following markers: CA-125. Results of the tumor marker test revealed 220.   02/03/2021 Imaging   Increased peritoneal soft tissue thickening and nodularity within the lower abdomen and pelvis, with minimal ascites. This is consistent with progressive peritoneal carcinomatosis.   New small ventral abdominal wall hernias.   Colonic diverticulosis. No radiographic evidence of diverticulitis.   Aortic Atherosclerosis (ICD10-I70.0).   02/14/2021 -  Chemotherapy   Patient is on Treatment Plan : Ovarian FOLFOX + Bevacizumab q14d     02/14/2021 Tumor Marker   Patient's tumor was tested for the following markers: CA-125. Results of the tumor marker test revealed 524.   03/14/2021 Tumor Marker   Patient's  tumor was tested for the following markers: CA-125. Results of the tumor marker test revealed 356.     PHYSICAL EXAMINATION: ECOG PERFORMANCE STATUS: 1 - Symptomatic but completely ambulatory  Vitals:   03/29/21 1126  BP: 126/67  Pulse: 95  Resp: 16  Temp: 98.5 F (36.9 C)  SpO2: 95%   Filed Weights   03/29/21 1126  Weight: 147 lb 12.8 oz (67 kg)    GENERAL:alert, no distress and comfortable SKIN: skin color, texture, turgor are normal, no rashes or significant lesions EYES: normal, Conjunctiva are pink and non-injected, sclera clear OROPHARYNX:no exudate, no erythema and lips, buccal mucosa, and tongue normal  NECK: supple, thyroid normal size, non-tender, without nodularity LYMPH:  no palpable lymphadenopathy in the cervical, axillary or inguinal LUNGS: clear to auscultation and percussion with normal breathing effort HEART: regular rate & rhythm and no murmurs and no lower extremity edema ABDOMEN:abdomen soft, non-tender and normal bowel sounds Musculoskeletal:no cyanosis of digits and no clubbing  NEURO: alert & oriented  x 3 with fluent speech, no focal motor/sensory deficits  LABORATORY DATA:  I have reviewed the data as listed    Component Value Date/Time   NA 135 03/29/2021 1103   K 4.2 03/29/2021 1103   CL 102 03/29/2021 1103   CO2 22 03/29/2021 1103   GLUCOSE 151 (H) 03/29/2021 1103   BUN 14 03/29/2021 1103   CREATININE 0.94 03/29/2021 1103   CREATININE 0.86 06/05/2014 0957   CALCIUM 9.0 03/29/2021 1103   PROT 7.7 03/29/2021 1103   ALBUMIN 3.3 (L) 03/29/2021 1103   AST 21 03/29/2021 1103   ALT 16 03/29/2021 1103   ALKPHOS 62 03/29/2021 1103   BILITOT 0.6 03/29/2021 1103   GFRNONAA >60 03/29/2021 1103   GFRAA >60 12/23/2019 0954   GFRAA >60 04/29/2019 0915    No results found for: SPEP, UPEP  Lab Results  Component Value Date   WBC 3.9 (L) 03/29/2021   NEUTROABS 1.0 (L) 03/29/2021   HGB 11.3 (L) 03/29/2021   HCT 34.4 (L) 03/29/2021   MCV 103.6  (H) 03/29/2021   PLT 228 03/29/2021      Chemistry      Component Value Date/Time   NA 135 03/29/2021 1103   K 4.2 03/29/2021 1103   CL 102 03/29/2021 1103   CO2 22 03/29/2021 1103   BUN 14 03/29/2021 1103   CREATININE 0.94 03/29/2021 1103   CREATININE 0.86 06/05/2014 0957      Component Value Date/Time   CALCIUM 9.0 03/29/2021 1103   ALKPHOS 62 03/29/2021 1103   AST 21 03/29/2021 1103   ALT 16 03/29/2021 1103   BILITOT 0.6 03/29/2021 1103

## 2021-03-29 NOTE — Patient Instructions (Signed)
[facility]  Discharge Instructions: Thank you for choosing [facility] to provide your oncology and hematology care.   If you have a lab appointment with the [facility], please go directly to the [facility] and check in at the registration area.   Wear comfortable clothing and clothing appropriate for easy access to any Portacath or PICC line.   We strive to give you quality time with your provider. You may need to reschedule your appointment if you arrive late (15 or more minutes).  Arriving late affects you and other patients whose appointments are after yours.  Also, if you miss three or more appointments without notifying the office, you may be dismissed from the clinic at the provider's discretion.      For prescription refill requests, have your pharmacy contact our office and allow 72 hours for refills to be completed.    Today you received the following chemotherapy and/or immunotherapy agents: Bevacizumab [facility]), Oxaliplatin, Leucovorin, and Fluorouracil.   To help prevent nausea and vomiting after your treatment, we encourage you to take your nausea medication as directed.  BELOW ARE SYMPTOMS THAT SHOULD BE REPORTED IMMEDIATELY: *FEVER GREATER THAN 100.4 F (38 C) OR HIGHER *CHILLS OR SWEATING *NAUSEA AND VOMITING THAT IS NOT CONTROLLED WITH YOUR NAUSEA MEDICATION *UNUSUAL SHORTNESS OF BREATH *UNUSUAL BRUISING OR BLEEDING *URINARY PROBLEMS (pain or burning when urinating, or frequent urination) *BOWEL PROBLEMS (unusual diarrhea, constipation, pain near the anus) TENDERNESS IN MOUTH AND THROAT WITH OR WITHOUT PRESENCE OF ULCERS (sore throat, sores in mouth, or a toothache) UNUSUAL RASH, SWELLING OR PAIN  UNUSUAL VAGINAL DISCHARGE OR ITCHING   Items with * indicate a potential emergency and should be followed up as soon as possible or go to the Emergency Department if any problems should occur.  Please show the CHEMOTHERAPY  ALERT CARD or IMMUNOTHERAPY ALERT CARD at check-in to the Emergency Department and triage nurse.  Should you have questions after your visit or need to cancel or reschedule your appointment, please contact [facility]  Dept: [phone]  and follow the prompts.  Office hours are 8:00 a.m. to 4:30 p.m. Monday - Friday. Please note that voicemails left after 4:00 p.m. may not be returned until the following business day.  We are closed weekends and major holidays. You have access to a nurse at all times for urgent questions. Please call the main number to the clinic Dept: [phone] and follow the prompts.   For any non-urgent questions, you may also contact your provider using MyChart. We now offer e-Visits for anyone 42 and older to request care online for non-urgent symptoms. For details visit [email or web address].   Also download the MyChart app! Go to the app store, search "MyChart", open the app, select [facility], and log in with your MyChart username and password.  Due to Covid, a mask is required upon entering the hospital/clinic. If you do not have a mask, one will be given to you upon arrival. For doctor visits, patients may have 1 support person aged 21 or older with them. For treatment visits, patients cannot have anyone with them due to current Covid guidelines and our immunocompromised population.   The chemotherapy medication bag should finish at 46 hours, 96 hours, or 7 days. For example, if your pump is scheduled for 46 hours and it was put on at 4:00 p.m., it should finish at 2:00 p.m. the day it is scheduled to come off regardless  of your appointment time.     Estimated time to finish at:   If the display on your pump reads "Low Volume" and it is beeping, take the batteries out of the pump and come to the cancer center for it to be taken off.   If the pump alarms go off prior to the pump reading "Low Volume" then call [phone] and someone  can assist you.  If the plunger comes out and the chemotherapy medication is leaking out, please use your home chemo spill kit to clean up the spill. Do NOT use paper towels or other household products.  If you have problems or questions regarding your pump, please call either [phone] (24 hours a day) or the cancer center Monday-Friday 8:00 a.m.- 4:30 p.m. at the clinic number and we will assist you. If you are unable to get assistance, then go to the nearest Emergency Department and ask the staff to contact the IV team for assistance.

## 2021-03-29 NOTE — Assessment & Plan Note (Signed)
She has recurrent nausea and vomiting within 2 hours after completion of treatment in the last few treatment It appears almost she has anticipatory nausea I recommend low-dose lorazepam today to see if that would help If not, I will get insurance authorization to add Emend in her future treatment

## 2021-03-29 NOTE — Progress Notes (Signed)
Per Dr. Alvy Bimler, ok for treatment today with ANC 1.0.

## 2021-03-29 NOTE — Assessment & Plan Note (Signed)
She has mild acquired pancytopenia due to treatment but not symptomatic We will proceed with treatment without delay

## 2021-03-29 NOTE — Assessment & Plan Note (Signed)
She tolerated recent treatment very well without major side effects, except for fatigue and mild pancytopenia Her blood pressure is well controlled I recommend minimum 6 doses of chemotherapy before repeating CT imaging

## 2021-03-30 LAB — CA 125: Cancer Antigen (CA) 125: 348 U/mL — ABNORMAL HIGH (ref 0.0–38.1)

## 2021-03-31 ENCOUNTER — Other Ambulatory Visit: Payer: Self-pay

## 2021-03-31 ENCOUNTER — Telehealth: Payer: Self-pay

## 2021-03-31 ENCOUNTER — Inpatient Hospital Stay: Payer: Medicare HMO

## 2021-03-31 VITALS — BP 120/63 | HR 87 | Temp 98.2°F | Resp 16

## 2021-03-31 DIAGNOSIS — R590 Localized enlarged lymph nodes: Secondary | ICD-10-CM

## 2021-03-31 DIAGNOSIS — C561 Malignant neoplasm of right ovary: Secondary | ICD-10-CM

## 2021-03-31 DIAGNOSIS — Z5112 Encounter for antineoplastic immunotherapy: Secondary | ICD-10-CM | POA: Diagnosis not present

## 2021-03-31 MED ORDER — HEPARIN SOD (PORK) LOCK FLUSH 100 UNIT/ML IV SOLN
500.0000 [IU] | Freq: Once | INTRAVENOUS | Status: AC | PRN
  Administered 2021-03-31: 500 [IU]

## 2021-03-31 MED ORDER — SODIUM CHLORIDE 0.9% FLUSH
10.0000 mL | INTRAVENOUS | Status: DC | PRN
  Administered 2021-03-31: 10 mL

## 2021-03-31 NOTE — Telephone Encounter (Signed)
-----   Message from [name], MD sent at 03/31/2021  8:28 AM EST ----- Regarding: pump DC Can you talk to her and ask if she had vomiting after chemo? I added lorazepam

## 2021-03-31 NOTE — Telephone Encounter (Signed)
Called and given below message. She verbalized understanding. She denies any vomiting and is feeling okay. She appreciated the call.

## 2021-04-08 MED FILL — Dexamethasone Sodium Phosphate Inj 100 MG/10ML: INTRAMUSCULAR | Qty: 1 | Status: AC

## 2021-04-10 ENCOUNTER — Encounter: Payer: Self-pay | Admitting: Hematology and Oncology

## 2021-04-11 ENCOUNTER — Inpatient Hospital Stay: Payer: Medicare HMO | Admitting: Hematology and Oncology

## 2021-04-11 ENCOUNTER — Ambulatory Visit (HOSPITAL_BASED_OUTPATIENT_CLINIC_OR_DEPARTMENT_OTHER): Payer: Medicare HMO | Admitting: Physician Assistant

## 2021-04-11 ENCOUNTER — Other Ambulatory Visit: Payer: Self-pay

## 2021-04-11 ENCOUNTER — Inpatient Hospital Stay: Payer: Medicare HMO

## 2021-04-11 ENCOUNTER — Other Ambulatory Visit: Payer: Self-pay | Admitting: Hematology and Oncology

## 2021-04-11 ENCOUNTER — Encounter: Payer: Self-pay | Admitting: Hematology and Oncology

## 2021-04-11 VITALS — BP 142/79 | HR 83 | Temp 97.7°F | Resp 18 | Ht 65.0 in | Wt 145.2 lb

## 2021-04-11 DIAGNOSIS — T50905A Adverse effect of unspecified drugs, medicaments and biological substances, initial encounter: Secondary | ICD-10-CM

## 2021-04-11 DIAGNOSIS — G5601 Carpal tunnel syndrome, right upper limb: Secondary | ICD-10-CM | POA: Diagnosis not present

## 2021-04-11 DIAGNOSIS — C561 Malignant neoplasm of right ovary: Secondary | ICD-10-CM

## 2021-04-11 DIAGNOSIS — T7840XA Allergy, unspecified, initial encounter: Secondary | ICD-10-CM | POA: Insufficient documentation

## 2021-04-11 DIAGNOSIS — D61818 Other pancytopenia: Secondary | ICD-10-CM | POA: Diagnosis not present

## 2021-04-11 DIAGNOSIS — T7840XS Allergy, unspecified, sequela: Secondary | ICD-10-CM | POA: Diagnosis not present

## 2021-04-11 DIAGNOSIS — R590 Localized enlarged lymph nodes: Secondary | ICD-10-CM

## 2021-04-11 DIAGNOSIS — Z5112 Encounter for antineoplastic immunotherapy: Secondary | ICD-10-CM | POA: Diagnosis not present

## 2021-04-11 LAB — CMP (CANCER CENTER ONLY)
ALT: 16 U/L (ref 0–44)
AST: 22 U/L (ref 15–41)
Albumin: 3 g/dL — ABNORMAL LOW (ref 3.5–5.0)
Alkaline Phosphatase: 75 U/L (ref 38–126)
Anion gap: 8 (ref 5–15)
BUN: 9 mg/dL (ref 8–23)
CO2: 25 mmol/L (ref 22–32)
Calcium: 8.6 mg/dL — ABNORMAL LOW (ref 8.9–10.3)
Chloride: 106 mmol/L (ref 98–111)
Creatinine: 0.94 mg/dL (ref 0.44–1.00)
GFR, Estimated: >60 mL/min (ref >60)
Glucose, Bld: 206 mg/dL — ABNORMAL HIGH (ref 70–99)
Potassium: 4.2 mmol/L (ref 3.5–5.1)
Sodium: 139 mmol/L (ref 135–145)
Total Bilirubin: 0.4 mg/dL (ref 0.3–1.2)
Total Protein: 7.3 g/dL (ref 6.5–8.1)

## 2021-04-11 LAB — CBC WITH DIFFERENTIAL (CANCER CENTER ONLY)
Abs Immature Granulocytes: 0 10*3/uL (ref 0.00–0.07)
Basophils Absolute: 0 10*3/uL (ref 0.0–0.1)
Basophils Relative: 1 %
Eosinophils Absolute: 0.2 10*3/uL (ref 0.0–0.5)
Eosinophils Relative: 4 %
HCT: 31.9 % — ABNORMAL LOW (ref 36.0–46.0)
Hemoglobin: 10.3 g/dL — ABNORMAL LOW (ref 12.0–15.0)
Immature Granulocytes: 0 %
Lymphocytes Relative: 42 %
Lymphs Abs: 1.8 10*3/uL (ref 0.7–4.0)
MCH: 32.7 pg (ref 26.0–34.0)
MCHC: 32.3 g/dL (ref 30.0–36.0)
MCV: 101.3 fL — ABNORMAL HIGH (ref 80.0–100.0)
Monocytes Absolute: 0.6 10*3/uL (ref 0.1–1.0)
Monocytes Relative: 14 %
Neutro Abs: 1.6 10*3/uL — ABNORMAL LOW (ref 1.7–7.7)
Neutrophils Relative %: 39 %
Platelet Count: 192 10*3/uL (ref 150–400)
RBC: 3.15 MIL/uL — ABNORMAL LOW (ref 3.87–5.11)
RDW: 15.3 % (ref 11.5–15.5)
WBC Count: 4.2 10*3/uL (ref 4.0–10.5)
nRBC: 0 % (ref 0.0–0.2)

## 2021-04-11 LAB — TOTAL PROTEIN, URINE DIPSTICK: Protein, ur: 30 mg/dL — AB

## 2021-04-11 MED ORDER — LEUCOVORIN CALCIUM INJECTION 350 MG
400.0000 mg/m2 | Freq: Once | INTRAVENOUS | Status: AC
  Administered 2021-04-11: 12:00:00 696 mg via INTRAVENOUS
  Filled 2021-04-11: qty 34.8

## 2021-04-11 MED ORDER — METHYLPREDNISOLONE SODIUM SUCC 125 MG IJ SOLR
125.0000 mg | Freq: Once | INTRAMUSCULAR | Status: AC | PRN
  Administered 2021-04-11: 16:00:00 62.5 mg via INTRAVENOUS

## 2021-04-11 MED ORDER — PALONOSETRON HCL INJECTION 0.25 MG/5ML
0.2500 mg | Freq: Once | INTRAVENOUS | Status: AC
  Administered 2021-04-11: 11:00:00 0.25 mg via INTRAVENOUS
  Filled 2021-04-11: qty 5

## 2021-04-11 MED ORDER — MEPERIDINE HCL 25 MG/ML IJ SOLN
12.5000 mg | Freq: Once | INTRAMUSCULAR | Status: AC
  Administered 2021-04-11: 14:00:00 12.5 mg via INTRAVENOUS
  Filled 2021-04-11: qty 1

## 2021-04-11 MED ORDER — SODIUM CHLORIDE 0.9% FLUSH
10.0000 mL | INTRAVENOUS | Status: DC | PRN
  Administered 2021-04-11: 17:00:00 10 mL

## 2021-04-11 MED ORDER — HEPARIN SOD (PORK) LOCK FLUSH 100 UNIT/ML IV SOLN
500.0000 [IU] | Freq: Once | INTRAVENOUS | Status: AC | PRN
  Administered 2021-04-11: 17:00:00 500 [IU]

## 2021-04-11 MED ORDER — MEPERIDINE HCL 25 MG/ML IJ SOLN
12.5000 mg | Freq: Once | INTRAMUSCULAR | Status: AC
  Administered 2021-04-11: 15:00:00 12.5 mg via INTRAVENOUS
  Filled 2021-04-11: qty 1

## 2021-04-11 MED ORDER — SODIUM CHLORIDE 0.9% FLUSH
10.0000 mL | Freq: Once | INTRAVENOUS | Status: AC
  Administered 2021-04-11: 09:00:00 10 mL

## 2021-04-11 MED ORDER — DEXTROSE 5 % IV SOLN
Freq: Once | INTRAVENOUS | Status: AC
Start: 2021-04-11 — End: 2021-04-11

## 2021-04-11 MED ORDER — SODIUM CHLORIDE 0.9 % IV SOLN: Freq: Once | INTRAVENOUS | Status: DC | PRN

## 2021-04-11 MED ORDER — OXALIPLATIN CHEMO INJECTION 100 MG/20ML
85.0000 mg/m2 | Freq: Once | INTRAVENOUS | Status: AC
  Administered 2021-04-11: 12:00:00 150 mg via INTRAVENOUS
  Filled 2021-04-11: qty 20

## 2021-04-11 MED ORDER — SODIUM CHLORIDE 0.9 % IV SOLN: Freq: Once | INTRAVENOUS | Status: AC

## 2021-04-11 MED ORDER — FAMOTIDINE 20 MG IN NS 100 ML IVPB
20.0000 mg | Freq: Once | INTRAVENOUS | Status: AC | PRN
  Administered 2021-04-11: 16:00:00 20 mg via INTRAVENOUS

## 2021-04-11 MED ORDER — SODIUM CHLORIDE 0.9 % IV SOLN
5.0000 mg/kg | Freq: Once | INTRAVENOUS | Status: AC
  Administered 2021-04-11: 12:00:00 300 mg via INTRAVENOUS
  Filled 2021-04-11: qty 12

## 2021-04-11 MED ORDER — LORAZEPAM 2 MG/ML IJ SOLN
1.0000 mg | Freq: Once | INTRAMUSCULAR | Status: AC
  Administered 2021-04-11: 11:00:00 1 mg via INTRAVENOUS
  Filled 2021-04-11: qty 1

## 2021-04-11 MED ORDER — SODIUM CHLORIDE 0.9 % IV SOLN
2400.0000 mg/m2 | INTRAVENOUS | Status: DC
  Filled 2021-04-11: qty 84

## 2021-04-11 MED ORDER — SODIUM CHLORIDE 0.9 % IV SOLN
10.0000 mg | Freq: Once | INTRAVENOUS | Status: AC
  Administered 2021-04-11: 11:00:00 10 mg via INTRAVENOUS
  Filled 2021-04-11: qty 10

## 2021-04-11 NOTE — Progress Notes (Addendum)
DATE:  04/11/21                                       X CHEMO/IMMUNOTHERAPY REACTION            MD: Alvy Bimler   AGENT/BLOOD PRODUCT RECEIVING TODAY:              Avastin, bevacizumab-bvzr, leucivirun, oxaliplatin   AGENT/BLOOD PRODUCT RECEIVING IMMEDIATELY PRIOR TO REACTION:          Oxaliplatin   VS: BP:     164/91   P:       110       SPO2:       99 % 2L [place]                BP:     178/90   P:       101       SPO2:       99% 2L [place]     REACTION(S):           Rigors   PREMEDS:     dexamethasone 10 mg IV, ativan 1 mg IV   INTERVENTION: Demerol 12.5 mg  Review of Systems  Review of Systems  Constitutional:  Positive for chills.  HENT:  Negative for facial swelling.   Eyes:  Negative for visual disturbance.  Respiratory:  Negative for chest tightness and shortness of breath.   Cardiovascular:  Negative for chest pain.  Gastrointestinal:  Negative for abdominal pain, nausea and vomiting.  Genitourinary:  Negative for dysuria.  Musculoskeletal:  Negative for arthralgias and myalgias.  Skin:  Negative for color change.  Neurological:  Positive for tremors (rigors). Negative for dizziness and seizures.  Psychiatric/Behavioral:  Negative for confusion.     Physical Exam  Physical Exam Vitals and nursing note reviewed.  Constitutional:      General: She is in acute distress.     Appearance: She is well-developed. She is not ill-appearing or toxic-appearing.     Comments: Patient with rigors during exam. Maintaining airway, speaking in full sentences  HENT:     Head: Normocephalic and atraumatic.     Right Ear: External ear normal.     Left Ear: External ear normal.     Nose: Nose normal.     Mouth/Throat:     Mouth: Mucous membranes are moist.     Pharynx: Oropharynx is clear.  Eyes:     General: No scleral icterus.       Right eye: No discharge.        Left eye: No discharge.     Conjunctiva/sclera: Conjunctivae normal.  Neck:     Vascular: No JVD.  Cardiovascular:      Rate and Rhythm: Regular rhythm. Tachycardia present.     Pulses: Normal pulses.     Heart sounds: Normal heart sounds.  Pulmonary:     Effort: Pulmonary effort is normal. No respiratory distress.     Breath sounds: Normal breath sounds. No stridor. No wheezing, rhonchi or rales.  Chest:     Chest wall: No tenderness.  Abdominal:     General: There is no distension.  Musculoskeletal:        General: Normal range of motion.     Cervical back: Normal range of motion.  Skin:    General: Skin is warm and dry.     Capillary  Refill: Capillary refill takes less than 2 seconds.     Findings: No erythema.  Neurological:     Mental Status: She is alert and oriented to person, place, and time.     GCS: GCS eye subscore is 4. GCS verbal subscore is 5. GCS motor subscore is 6.     Comments: Fluent speech, no facial droop.  Psychiatric:        Behavior: Behavior normal.    OUTCOME:          Patient with rigors after receiving 178 ml of oxali treatment. She is hypertensive and tachycardic likely 2/2 to rigors, placed on O2 for comfort. Patient given Demerol and symptoms resolved. BP and tachycardia resolved when rechecked, O2 removed. Patient evaluated by oncologist Dr. Alvy Bimler who agrees with plan to stop oxali. Patient already received Avastin and will proceed with LV/5FU. Patient closely monitored for remainder of treatment.  Patient had subsequent reaction after finishing leucovorin that was managed by MD as I had already left the clinic for the day. Please see MD and nursing notes.

## 2021-04-11 NOTE — Assessment & Plan Note (Addendum)
At the time of dictation, the patient developed severe reaction to oxaliplatin She is being treated appropriately with additional premedications and allergy medicine to counteract the reaction She will complete 5-FU infusion, Avastin and leucovorin as scheduled I plan to order CT imaging next month on January to 10 and see her back on January 12 to assess response to therapy Given her allergic reaction, we will need to switch her treatment to something else or proceed to switch her to maintains bevacizumab only if she have excellent response to therapy

## 2021-04-11 NOTE — Assessment & Plan Note (Signed)
She is responding to additional premedications to counteract her allergic reaction Carboplatin will be entered as medication allergy We will call her tomorrow to check on her

## 2021-04-11 NOTE — Patient Instructions (Signed)
[facility]  Discharge Instructions: Thank you for choosing [facility] to provide your oncology and hematology care.   If you have a lab appointment with the [facility], please go directly to the [facility] and check in at the registration area.   Wear comfortable clothing and clothing appropriate for easy access to any Portacath or PICC line.   We strive to give you quality time with your provider. You may need to reschedule your appointment if you arrive late (15 or more minutes).  Arriving late affects you and other patients whose appointments are after yours.  Also, if you miss three or more appointments without notifying the office, you may be dismissed from the clinic at the providers discretion.      For prescription refill requests, have your pharmacy contact our office and allow 72 hours for refills to be completed.    Today you received the following chemotherapy and/or immunotherapy agents Zirabev, oxaliplatin, Leucovorin and 5 FU      To help prevent nausea and vomiting after your treatment, we encourage you to take your nausea medication as directed.  BELOW ARE SYMPTOMS THAT SHOULD BE REPORTED IMMEDIATELY: *FEVER GREATER THAN 100.4 F (38 C) OR HIGHER *CHILLS OR SWEATING *NAUSEA AND VOMITING THAT IS NOT CONTROLLED WITH YOUR NAUSEA MEDICATION *UNUSUAL SHORTNESS OF BREATH *UNUSUAL BRUISING OR BLEEDING *URINARY PROBLEMS (pain or burning when urinating, or frequent urination) *BOWEL PROBLEMS (unusual diarrhea, constipation, pain near the anus) TENDERNESS IN MOUTH AND THROAT WITH OR WITHOUT PRESENCE OF ULCERS (sore throat, sores in mouth, or a toothache) UNUSUAL RASH, SWELLING OR PAIN  UNUSUAL VAGINAL DISCHARGE OR ITCHING   Items with * indicate a potential emergency and should be followed up as soon as possible or go to the Emergency Department if any problems should occur.  Please show the CHEMOTHERAPY ALERT CARD or  IMMUNOTHERAPY ALERT CARD at check-in to the Emergency Department and triage nurse.  Should you have questions after your visit or need to cancel or reschedule your appointment, please contact [facility]  Dept: [phone]  and follow the prompts.  Office hours are 8:00 a.m. to 4:30 p.m. Monday - Friday. Please note that voicemails left after 4:00 p.m. may not be returned until the following business day.  We are closed weekends and major holidays. You have access to a nurse at all times for urgent questions. Please call the main number to the clinic Dept: [phone] and follow the prompts.   For any non-urgent questions, you may also contact your provider using MyChart. We now offer e-Visits for anyone 81 and older to request care online for non-urgent symptoms. For details visit [email or web address].   Also download the MyChart app! Go to the app store, search "MyChart", open the app, select [facility], and log in with your MyChart username and password.  Due to Covid, a mask is required upon entering the hospital/clinic. If you do not have a mask, one will be given to you upon arrival. For doctor visits, patients may have 1 support person aged 30 or older with them. For treatment visits, patients cannot have anyone with them due to current Covid guidelines and our immunocompromised population.

## 2021-04-11 NOTE — Progress Notes (Signed)
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[place]) At the time of dictation, the patient developed severe reaction to oxaliplatin She is being treated appropriately with additional premedications and allergy medicine to counteract the reaction She will complete 5-FU infusion, Avastin and leucovorin as scheduled I plan to order CT imaging next month on January to 10 and see her back on January 12 to assess response to therapy Given her allergic reaction, we will need to switch her treatment to something else or proceed to switch her to maintains bevacizumab only if she have excellent response to therapy  Pancytopenia, acquired [facility]) She has mild acquired pancytopenia due to treatment but not symptomatic We will proceed with treatment without delay  Carpal tunnel syndrome on right She likely had carpal tunnel syndrome on the right Observe closely  Allergic reaction caused by a drug She is responding to additional premedications to counteract her allergic reaction Carboplatin will be entered as medication allergy We will call her tomorrow to check on her  Orders Placed This Encounter  Procedures   CT CHEST ABDOMEN PELVIS W CONTRAST    Standing Status:   Future    Standing Expiration Date:   04/11/2022    Order Specific Question:   Preferred imaging location?    Answer:   [facility]    Order Specific Question:   Radiology Contrast Protocol - do NOT remove file path    Answer:   [email or web address]    All questions were answered. The patient knows to call the clinic with any problems, questions or concerns. The total time spent in the appointment was 40 minutes encounter with patients including review of chart and various tests results, discussions about plan of care and coordination of care plan   [name], MD 04/11/2021 2:17 PM  INTERVAL HISTORY: Please see below for problem oriented charting. 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 She had no nausea She has mild neuropathy, stable Denies bloating or changes in bowel habits She is reviewed again around 2:00 due to allergic reaction to oxaliplatin, complicated by significant elevated blood pressure and shaking that responded to additional premedications  REVIEW OF SYSTEMS:   Constitutional: Denies fevers, chills or abnormal weight loss Eyes: Denies blurriness of vision Ears, nose, mouth, throat, and face: Denies mucositis or sore throat Respiratory: Denies cough, dyspnea or wheezes Cardiovascular: Denies palpitation, chest discomfort or lower extremity swelling Skin: Denies abnormal skin rashes Lymphatics: Denies new lymphadenopathy or easy bruising Neurological:Denies numbness, tingling or new weaknesses Behavioral/Psych: Mood is stable, no new changes  All other systems were reviewed with the patient and are negative.  I have reviewed the past medical history, past surgical history, social history and family history with the patient and they are unchanged from previous note.  ALLERGIES:  is allergic to oxaliplatin.  MEDICATIONS:  Current Outpatient Medications  Medication Sig Dispense Refill   loratadine (CLARITIN) 10 MG tablet Take 10 mg by mouth daily.     acetaminophen (TYLENOL) 500 MG tablet Take 500 mg by mouth every 8 (eight) hours as needed for mild pain or moderate pain.     atenolol (TENORMIN) 25 MG tablet Take 25 mg by mouth at bedtime.      atorvastatin (LIPITOR) 20 MG tablet Take 20 mg by mouth daily.     CALCIUM-MAGNESIUM-VITAMIN D  PO Take 1 tablet by mouth daily.     lidocaine-prilocaine (EMLA) cream Apply to affected area once 30 g 3   losartan (COZAAR) 50 MG tablet Take 50 mg by mouth at bedtime.     metFORMIN (GLUCOPHAGE) 1000 MG  tablet Take 500 mg by mouth 2 (two) times daily.   0   Multiple Vitamin (MULTIVITAMIN WITH MINERALS) TABS tablet Take 1 tablet by mouth daily.     ondansetron (ZOFRAN) 8 MG tablet Take 1 tablet (8 mg total) by mouth every 8 (eight) hours as needed for refractory nausea / vomiting. 30 tablet 1   prochlorperazine (COMPAZINE) 10 MG tablet Take 1 tablet (10 mg total) by mouth every 6 (six) hours as needed (Nausea or vomiting). 30 tablet 1   No current facility-administered medications for this visit.   Facility-Administered Medications Ordered in Other Visits  Medication Dose Route Frequency Provider Last Rate Last Admin   fluorouracil (ADRUCIL) 4,200 mg in sodium chloride 0.9 % 66 mL chemo infusion  2,400 mg/m2 (Treatment Plan Recorded) Intravenous 1 day or 1 dose Alvy Bimler, Arletha Marschke, MD       heparin lock flush 100 unit/mL  500 Units Intracatheter Once PRN Alvy Bimler, Romesha Scherer, MD       sodium chloride flush (NS) 0.9 % injection 10 mL  10 mL Intracatheter PRN Alvy Bimler, Alder Murri, MD        SUMMARY OF ONCOLOGIC HISTORY: Oncology History Overview Note  ER 15-20%, PR 5-10%, Her2/neu negative MMR normal Negative genetic testing HRD pos BRCA1 positive MSI Stable   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 The left adnexa measured 12.7 x 11.6 x 8.1 with low level echoes and soft tissue nodules    07/30/2017 Tumor Marker   Patient's tumor was tested for the following markers: CA-125 Results of the tumor marker test revealed 521.3   08/17/2017 Pathology Results   The malignant cells are positive for PAX-8, cytokeratin 7, estrogen receptor, and faintly positive for GATA-3. They are negative for p53, GCDFP, and cytokeratin 20. The finding are consistent with a gynecologic primary carcinoma. Additional studies can be performed upon clinician request.    08/17/2017 Procedure   Technically successful CT-guided left lower quadrant omental mass core biopsy.   08/20/2017 PET scan   1. Cystic masses arising from the pelvis. The nodular component of the RIGHT cystic mass is intensely hypermetabolic consistent with malignant ovarian neoplasm. 2. Extensive hypermetabolic peritoneal thickening in the lower abdomen and upper pelvis, upper abdomen, and upper abdominal precordial fat and paradiaphragmatic fat. 3. Retroperitoneal nodal metastasis adjacent to the IVC at the level the kidneys. 4. No evidence of metastatic disease in the thorax other small effusion on the LEFT and nodal metastasis in the fat superior to the diaphragm. 5. Mild metabolic activity associated the distal esophagus is favored benign esophagitis.   08/20/2017 Imaging   CT chest 1. Bilateral cardiophrenic angle nodal metastasis. No additional findings to suggest metastatic disease to the chest. 2. Small left pleural effusion. 3. Peritoneal carcinomatosis noted within the abdomen. 4. Hepatic steatosis.     08/24/2017 Cancer Staging   Staging form: Ovary, Fallopian Tube, and Primary Peritoneal Carcinoma, AJCC 8th Edition - Clinical: Stage IV (cT3, cN1, cM1) - Signed by [name], MD on 08/24/2017    08/31/2017 Tumor Marker   Patient's tumor was  tested for the following markers: CA-125 Results of the tumor marker test revealed 819.9   09/26/2017 Adverse Reaction   She developed reaction to Taxol, managed successfully with additional premedications   10/17/2017 Tumor Marker   Patient's tumor was tested for the following markers: CA-125 Results of the tumor marker test revealed 374.4   10/31/2017 Imaging   1. No significant change in size of the large complex bilateral adnexal masses consistent with the known history of ovarian cancer. There is increased dependent density within the left-sided lesion. 2. Stable peritoneal carcinomatosis.  No significant ascites.  3. The bilateral  pericardiac adenopathy has improved. No progressive thoracic metastatic disease. No pulmonary or osseous metastatic disease. 4. Suspicion of nonocclusive thrombus in the right internal jugular vein related to the right IJ port. Recommend further evaluation with Doppler ultrasound.   11/13/2017 Surgery   Preoperative Diagnosis: Ovarian cancer s/p neoadjuvant chemotherapy    Procedure(s) Performed:   Exploratory laparotomy  Bilateral salpingo-oophorectomy with radical tumor debulking for ovarian cancer  including retroperitoneal dissection, lysis of adhesions (enterolysis of small bowel in deep pelvis, left adnexal adhesions to sigmoid and omentum to LLQ) ~1 hour, ureterolysis. Omentectomy  Takedown of splenic flexure with removal of omental tumor in left upper quadrant    Specimens: Bilateral tubes and ovaries, omentum, splenic flexure nodule, right paracolic gutter nodule, cecal nodule, right ureteral peritoneal nodule, small bowel mesentary.    Indication for Procedure:  Patient is s/p 3 cycles of chemotherapy with response based on CA125 and decreased mediastinal disease (to <1cm).   Operative Findings:  This represented an optimal cytoreduction with gross residual disease remaining in the deep pelvis near the levator floor and possibly in the region of the right IP ligament/periappendicial region.   Upon entry a large ~20cm right tube/ovary, mostly cystic. Adherent rind along ileocecal region and appendix. Large cystic left tube/ovary ~10cm with sigmoid colon stretched across the mass and extension of the cystic lesion into the deep pelvis with residual palpable disease deep pelvis near levators. Estimate ~1cm or less on palpation, not visible disease. Omental caking in the LLQ adherent to pelvic sidewall. Omental disease noted RUQ separate. In addition ~1.5cm lesion in splenic flexure omentum. Evidence of prior diaphragmatic disease, no visible disease.       11/13/2017 Pathology Results    1. 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 - TUMOR INVOLVES THE OVARIAN SURFACE. - RIGHT FALLOPIAN TUBE IS INVOLVED. - SEE ONCOLOGY TABLE. - SEE NOTE. 2. Soft tissue mass, biopsy, cecal gutter nodule - METASTATIC MUCINOUS ADENOCARCINOMA. 3. Ovary and fallopian tube, left, left ovary and fallopian tube - METASTATIC MUCINOUS ADENOCARCINOMA TO LEFT OVARY AND FALLOPIAN TUBE. 4. Omentum, resection for tumor - METASTATIC MUCINOUS ADENOCARCINOMA TO OMENTUM. 5. Soft tissue mass, biopsy, splenic flexure nodule - METASTATIC MUCINOUS ADENOCARCINOMA. 6. Soft tissue mass, biopsy, cecal implant - METASTATIC MUCINOUS ADENOCARCINOMA. 7. Soft tissue mass, biopsy, right ureteral peritoneal implant - METASTATIC MUCINOUS ADENOCARCINOMA. 8. Mesentery, small bowel nodule - METASTATIC MUCINOUS ADENOCARCINOMA. Microscopic Comment 1. OVARY or FALLOPIAN TUBE or PRIMARY PERITONEUM: Procedure: Bilateral salpingo-oophorectomy. Specimen Integrity: Intact. Tumor Site: Right ovary. Ovarian Surface Involvement: Present. Fallopian Tube Surface Involvement: Present. Tumor Size: 20 cm. Histologic Type: Mucinous adenocarcinoma. Histologic Grade: Overall G2 (moderately differentiated) (focal areas of poor differentiation are present). Implants: Present. Other Tissue/ Organ Involvement: Left ovary, left fallopian tube and omentum. Largest Extrapelvic Peritoneal Focus: less than 2 cm. See note Peritoneal/Ascitic Fluid: N/A Treatment Effect:  No definite or minimal response identified (chemotherapy response score 1 [CRS 1]) Regional Lymph Nodes No lymph nodes submitted or found Number of Lymph Nodes Examined: 0 Pathologic Stage Classification (pTNM, AJCC 8th Edition): ypT3b, ypNX. Representative Tumor Block: 1D and 1E. (NDK:gt, 11/15/17)   11/19/2017 Genetic Testing   Negative genetic testing on the [employer] Myrisk panel.  The [facility] gene panel offered by [employer] includes sequencing and deletion/duplication testing of the following 35 genes: APC, ATM, AXIN2, BARD1, BMPR1A, BRCA1, BRCA2, BRIP1, CHD1, CDK4, CDKN2A, CHEK2, EPCAM (large rearrangement only), HOXB13, (sequencing only), GALNT12, MLH1, MSH2, MSH3 (excluding repetitive portions of exon 1), MSH6, MUTYH, NBN, NTHL1, PALB2, PMS2, PTEN, RAD51C, RAD51D, RNF43, RPS20, SMAD4, STK11, and TP53. Sequencing was performed for select regions of POLE and POLD1, and large rearrangement analysis was performed for select regions of GREM1. The report date is November 19, 2017.  HRD tumor results indicate a BRCA1 mutation identified in the ovarian tumor causing genomic instability. The report date is 12/04/2017     Genetic Testing   Patient has genetic testing done for ER/PR and Her2/neu. Results revealed patient has the following: ER 15-20% PR 5-10% Her2/neu - negative    Genetic Testing   Patient has genetic testing done for MSI/MMR. Results revealed patient has the following mutation(s): MMR normal, MSI stable   12/10/2017 Imaging   1. Interval resection of large bilateral adnexal cystic masses. Residual soft tissue/tumor within bilateral adnexal regions as above. 2. Interval resolution of previously noted omental cake which may reflect interval omentectomy. There is a new peritoneal deposit identified within the left upper quadrant of the abdomen involving the anterior surface of the spleen.   12/10/2017 Tumor Marker   Patient's tumor was tested for the following markers: CA-125 Results of the tumor marker test revealed 94.9   12/11/2017 - 02/20/2018 Chemotherapy   The patient had FOLFOX regimen for mucinous   01/09/2018 Tumor Marker   Patient's tumor was tested for the following markers: CA-125 Results of the tumor marker test revealed 37   01/31/2018 Imaging   Resolution of peritoneal soft tissue density along the anterior margin of the spleen since prior study. No evidence of  residual or progressive metastatic disease.  Mild sigmoid diverticulosis, without radiographic evidence of diverticulitis. Resolution of small pericolonic abscess along the left lateral pelvic sidewall.  Mild hepatic steatosis.   02/06/2018 Tumor Marker   Patient's tumor was tested for the following markers: CA-125 Results of the tumor marker test revealed 36.7   03/06/2018 -  Chemotherapy   The patient is started on [name] as PARP maintenance   03/13/2018 Tumor Marker   Patient's tumor was tested for the following markers: CA-125 Results of the tumor marker test revealed 33.8   04/05/2018 Tumor Marker   Patient's tumor was tested for the following markers: CA-125 Results of the tumor marker test revealed 27.3   05/03/2018 Tumor Marker   Patient's tumor was tested for the following markers: CA-125 Results of the tumor marker test revealed 23.5   07/08/2018 Tumor Marker   Patient's tumor was tested for the following markers: CA-125 Results of the tumor marker test revealed 19.9   09/02/2018 Tumor Marker   Patient's tumor was tested for the following markers: CA-125 Results of the tumor marker test revealed 17   10/28/2018 Imaging   1. 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 Repeat  abdominal MRI with and without IV gadolinium with MRCP is recommended in 2 years to ensure continued stability. This recommendation follows ACR consensus guidelines: Management of Incidental Pancreatic Cysts: A White Paper of the ACR Incidental Findings Committee. [place] [id number]. 2. Hepatic steatosis. 3. Additional incidental findings, as above.     10/28/2018 Tumor Marker   Patient's tumor was tested for the following markers: CA-125 Results of the tumor marker test revealed 16.9   03/03/2019 Tumor Marker   Patient's tumor was tested for the  following markers: CA-125 Results of the tumor marker test revealed 12.7.   07/09/2019 Tumor Marker   Patient's tumor was tested for the following markers: CA-125 Results of the tumor marker test revealed 9.5.   09/02/2019 Tumor Marker   Patient's tumor was tested for the following markers: CA-125 Results of the tumor marker test revealed 11.   10/28/2019 Tumor Marker   Patient's tumor was tested for the following markers: CA-125 Results of the tumor marker test revealed 10.2   12/23/2019 Tumor Marker   Patient's tumor was tested for the following markers: CA-125 Results of the tumor marker test revealed 12.1   02/17/2020 Tumor Marker   Patient's tumor was tested for the following markers: CA-125 Results of the tumor marker test revealed 9.5   05/25/2020 Tumor Marker   Patient's tumor was tested for the following markers: CA-125 Results of the tumor marker test revealed 9.6   07/13/2020 Tumor Marker   Patient's tumor was tested for the following markers: CA-125 Results of the tumor marker test revealed 10.   11/01/2020 Tumor Marker   Patient's tumor was tested for the following markers: CA-125. Results of the tumor marker test revealed 18.1.   11/01/2020 Imaging   1. Cystic pancreatic lesions, potentially small intraductal papillary mucinous neoplasm. Potential slight increase in size of 1 of these areas in particular near the tail of the pancreas with perhaps mild increase in ventral duct dilation but no main pancreatic duct dilation. Given patient history consider six-month follow-up to assess for stability. 7 mm as the largest area with potential increase in size from 3 mm. 2. Hepatic steatosis. 3. Postoperative changes in the midline of the abdomen.   01/25/2021 Tumor Marker   Patient's tumor was tested for the following markers: CA-125. Results of the tumor marker test revealed 220.   02/03/2021 Imaging   Increased peritoneal soft tissue thickening and nodularity within the  lower abdomen and pelvis, with minimal ascites. This is consistent with progressive peritoneal carcinomatosis.   New small ventral abdominal wall hernias.   Colonic diverticulosis. No radiographic evidence of diverticulitis.   Aortic Atherosclerosis (ICD10-I70.0).   02/14/2021 -  Chemotherapy   Patient is on Treatment Plan : Ovarian FOLFOX + Bevacizumab q14d     02/14/2021 Tumor Marker   Patient's tumor was tested for the following markers: CA-125. Results of the tumor marker test revealed 524.   03/14/2021 Tumor Marker   Patient's tumor was tested for the following markers: CA-125. Results of the tumor marker test revealed 356.     PHYSICAL EXAMINATION: ECOG PERFORMANCE STATUS: 1 - Symptomatic but completely ambulatory  Vitals:   04/11/21 0945  BP: (!) 142/79  Pulse: 83  Resp: 18  Temp: 97.7 F (36.5 C)  SpO2: 96%   Filed Weights   04/11/21 0945  Weight: 145 lb 3.2 oz (65.9 kg)    GENERAL:alert, no distress and comfortable SKIN: skin color, texture, turgor are normal, no rashes or significant lesions  EYES: normal, Conjunctiva are pink and non-injected, sclera clear OROPHARYNX:no exudate, no erythema and lips, buccal mucosa, and tongue normal  NECK: supple, thyroid normal size, non-tender, without nodularity LYMPH:  no palpable lymphadenopathy in the cervical, axillary or inguinal LUNGS: clear to auscultation and percussion with normal breathing effort HEART: regular rate & rhythm and no murmurs and no lower extremity edema ABDOMEN:abdomen soft, non-tender and normal bowel sounds Musculoskeletal:no cyanosis of digits and no clubbing  NEURO: alert & oriented x 3 with fluent speech, no focal motor/sensory deficits  LABORATORY DATA:  I have reviewed the data as listed    Component Value Date/Time   NA 139 04/11/2021 0918   K 4.2 04/11/2021 0918   CL 106 04/11/2021 0918   CO2 25 04/11/2021 0918   GLUCOSE 206 (H) 04/11/2021 0918   BUN 9 04/11/2021 0918    CREATININE 0.94 04/11/2021 0918   CREATININE 0.86 06/05/2014 0957   CALCIUM 8.6 (L) 04/11/2021 0918   PROT 7.3 04/11/2021 0918   ALBUMIN 3.0 (L) 04/11/2021 0918   AST 22 04/11/2021 0918   ALT 16 04/11/2021 0918   ALKPHOS 75 04/11/2021 0918   BILITOT 0.4 04/11/2021 0918   GFRNONAA >60 04/11/2021 0918   GFRAA >60 12/23/2019 0954   GFRAA >60 04/29/2019 0915    No results found for: SPEP, UPEP  Lab Results  Component Value Date   WBC 4.2 04/11/2021   NEUTROABS 1.6 (L) 04/11/2021   HGB 10.3 (L) 04/11/2021   HCT 31.9 (L) 04/11/2021   MCV 101.3 (H) 04/11/2021   PLT 192 04/11/2021      Chemistry      Component Value Date/Time   NA 139 04/11/2021 0918   K 4.2 04/11/2021 0918   CL 106 04/11/2021 0918   CO2 25 04/11/2021 0918   BUN 9 04/11/2021 0918   CREATININE 0.94 04/11/2021 0918   CREATININE 0.86 06/05/2014 0957      Component Value Date/Time   CALCIUM 8.6 (L) 04/11/2021 0918   ALKPHOS 75 04/11/2021 0918   AST 22 04/11/2021 0918   ALT 16 04/11/2021 0918   BILITOT 0.4 04/11/2021 6962

## 2021-04-11 NOTE — Assessment & Plan Note (Signed)
She likely had carpal tunnel syndrome on the right Observe closely

## 2021-04-11 NOTE — Assessment & Plan Note (Signed)
She has mild acquired pancytopenia due to treatment but not symptomatic We will proceed with treatment without delay

## 2021-04-11 NOTE — Progress Notes (Signed)
Hypersensitivity Reaction note  Date of event: 04/11/21 Time of event: 1328 Generic name of drug involved: Oxaliplatin Name of provider notified of the hypersensitivity reaction: Kaitlyn, PA and Dr. Alvy Bimler Was agent that likely caused hypersensitivity reaction added to Allergies List within EMR? yes Chain of events including reaction signs/symptoms, treatment administered, and outcome (e.g., drug resumed; drug discontinued; sent to Emergency Department; etc.) medication stopped.  VS164/91, 95% on room air 111 hr 98.0.  Line flushed with D5.  NS bolus started.  Verline Lema, PA at chairside.  Demerol ordered.  Demerol 12.5 mg IV given.  1348 148/90 98% on 2 liters [place] 100% HR 101 Rigors decreasing.  1351 171/95, 99% 101 HR  rigors stopped.  1357  Pt resting with eyes closed.  States she is better VS 171/82, 98% HR 97   1400 Pt resting with eyes closed.  VS 166/87, 98% HR 98.  1406 O2 discontinued O2 sat 100%  1411  164/88, HR 97, O2 97% on room air.   1428 BP 154/80 HR 106, 98%   1432 VS  HR 101, 150/69 98%  Leucovorin restarted as ordered.  Oxaliplatin discontinued.,   1515 Leucovorin completed.  Pt returned from the restroom with rigors. VS 147/123, HR 115, O2 sat 93.  NS started.  DR. Alvy Bimler notified.  1522 Demerol 12.5mg  Iv given as ordered  1528 Solumedrol 62.5 mg IV given as ordered. 1529 Pepcid 20 mg iv given as ordered.  1531 VS 173/89, HR 111, 93%.  1538  174/87, HR 111, O2 sat 93%; Rigors improving.  Pt resting with her eyes closed.  1542 163/86, HR 104, 93%  pt resting.  1551 162/78 HR 110, 92%  Rigors have resolved.  Pt resting.  1556  Pt placed on 2 L [place] (O2 sats decreased to 90%)  134/92, 95%, 108.  1605 pt resting comfortably.   1646  Assisted X 2 to the restroom.  Pt very drowsy  VS 139/71, HR 115, 97% on 2 liters.   62  Dr. Alvy Bimler notified of patient being lethargic.  MD ordered for the pt to be monitor until 1715.  Call MD with update.  Hold CADD pump  until then.  If pt take to ER do not connect pump   1711  Per Dr. Lindi Adie, take patient to the ER to be further monitored. Family updated.  [place]  Family adamant about taking patient home.  Dr. Lindi Adie notified.  Per Dr. Lindi Adie ok to discharge pt home with family, but deaccess port.  [place] flushed and deaccessed per policy.  Pt discharged home with family; assisted pt to family car.  Phillips Climes, RN 04/11/2021 1:53 PM

## 2021-04-12 ENCOUNTER — Telehealth: Payer: Self-pay

## 2021-04-12 NOTE — Telephone Encounter (Signed)
Called to see how she is doing today after having a reaction yesterday to treatment. She is doing well. No complaints and she getting ready to eat. She slept for 12 hours last night and feels much better. Instructed to call the office back for concerns. She verbalized understanding and appreciated the call.  Canceled the 12/21 appt for pump d/c.

## 2021-04-13 ENCOUNTER — Inpatient Hospital Stay: Payer: Medicare HMO

## 2021-04-20 ENCOUNTER — Telehealth: Payer: Self-pay

## 2021-04-20 NOTE — Telephone Encounter (Signed)
Called and scheduled CT scan. Reviewed appts and date/time, NPO 4 hours prior to scan.She is aware.

## 2021-04-25 ENCOUNTER — Other Ambulatory Visit: Payer: Self-pay | Admitting: Hematology and Oncology

## 2021-04-25 DIAGNOSIS — C561 Malignant neoplasm of right ovary: Secondary | ICD-10-CM

## 2021-05-02 ENCOUNTER — Ambulatory Visit: Payer: Medicare HMO | Admitting: Hematology and Oncology

## 2021-05-02 ENCOUNTER — Other Ambulatory Visit: Payer: Medicare HMO

## 2021-05-02 ENCOUNTER — Ambulatory Visit: Payer: Medicare HMO

## 2021-05-03 ENCOUNTER — Encounter (HOSPITAL_COMMUNITY): Payer: Self-pay

## 2021-05-03 ENCOUNTER — Other Ambulatory Visit: Payer: Self-pay

## 2021-05-03 ENCOUNTER — Ambulatory Visit (HOSPITAL_COMMUNITY)
Admission: RE | Admit: 2021-05-03 | Discharge: 2021-05-03 | Disposition: A | Discharge status: Home / Self Care | Payer: Medicare HMO | Source: Ambulatory Visit | Attending: Hematology and Oncology | Admitting: Hematology and Oncology

## 2021-05-03 ENCOUNTER — Inpatient Hospital Stay: Payer: Medicare HMO | Attending: Hematology and Oncology

## 2021-05-03 ENCOUNTER — Other Ambulatory Visit: Payer: Medicare HMO

## 2021-05-03 ENCOUNTER — Inpatient Hospital Stay: Payer: Medicare HMO

## 2021-05-03 DIAGNOSIS — R590 Localized enlarged lymph nodes: Secondary | ICD-10-CM

## 2021-05-03 DIAGNOSIS — C561 Malignant neoplasm of right ovary: Secondary | ICD-10-CM | POA: Diagnosis not present

## 2021-05-03 LAB — CBC WITH DIFFERENTIAL (CANCER CENTER ONLY)
Abs Immature Granulocytes: 0.01 10*3/uL (ref 0.00–0.07)
Basophils Absolute: 0.1 10*3/uL (ref 0.0–0.1)
Basophils Relative: 1 %
Eosinophils Absolute: 0.2 10*3/uL (ref 0.0–0.5)
Eosinophils Relative: 3 %
HCT: 33.8 % — ABNORMAL LOW (ref 36.0–46.0)
Hemoglobin: 10.6 g/dL — ABNORMAL LOW (ref 12.0–15.0)
Immature Granulocytes: 0 %
Lymphocytes Relative: 54 %
Lymphs Abs: 3.8 10*3/uL (ref 0.7–4.0)
MCH: 31.6 pg (ref 26.0–34.0)
MCHC: 31.4 g/dL (ref 30.0–36.0)
MCV: 100.9 fL — ABNORMAL HIGH (ref 80.0–100.0)
Monocytes Absolute: 0.5 10*3/uL (ref 0.1–1.0)
Monocytes Relative: 7 %
Neutro Abs: 2.5 10*3/uL (ref 1.7–7.7)
Neutrophils Relative %: 35 %
Platelet Count: 149 10*3/uL — ABNORMAL LOW (ref 150–400)
RBC: 3.35 MIL/uL — ABNORMAL LOW (ref 3.87–5.11)
RDW: 16.1 % — ABNORMAL HIGH (ref 11.5–15.5)
WBC Count: 7.1 10*3/uL (ref 4.0–10.5)
nRBC: 0 % (ref 0.0–0.2)

## 2021-05-03 LAB — CMP (CANCER CENTER ONLY)
ALT: 18 U/L (ref 0–44)
AST: 30 U/L (ref 15–41)
Albumin: 3.5 g/dL (ref 3.5–5.0)
Alkaline Phosphatase: 71 U/L (ref 38–126)
Anion gap: 6 (ref 5–15)
BUN: 13 mg/dL (ref 8–23)
CO2: 28 mmol/L (ref 22–32)
Calcium: 9.1 mg/dL (ref 8.9–10.3)
Chloride: 104 mmol/L (ref 98–111)
Creatinine: 0.91 mg/dL (ref 0.44–1.00)
GFR, Estimated: >60 mL/min (ref >60)
Glucose, Bld: 137 mg/dL — ABNORMAL HIGH (ref 70–99)
Potassium: 4.1 mmol/L (ref 3.5–5.1)
Sodium: 138 mmol/L (ref 135–145)
Total Bilirubin: 0.4 mg/dL (ref 0.3–1.2)
Total Protein: 8 g/dL (ref 6.5–8.1)

## 2021-05-03 LAB — TOTAL PROTEIN, URINE DIPSTICK: Protein, ur: NEGATIVE mg/dL

## 2021-05-03 MED ORDER — IOHEXOL 9 MG/ML PO SOLN
ORAL | Status: AC
  Filled 2021-05-03: qty 1000

## 2021-05-03 MED ORDER — HEPARIN SOD (PORK) LOCK FLUSH 100 UNIT/ML IV SOLN: 500.0000 [IU] | Freq: Once | INTRAVENOUS | Status: DC

## 2021-05-03 MED ORDER — HEPARIN SOD (PORK) LOCK FLUSH 100 UNIT/ML IV SOLN
INTRAVENOUS | Status: AC
  Administered 2021-05-03: 500 [IU]
  Filled 2021-05-03: qty 5

## 2021-05-03 MED ORDER — SODIUM CHLORIDE 0.9% FLUSH
10.0000 mL | Freq: Once | INTRAVENOUS | Status: AC
  Administered 2021-05-03: 10 mL

## 2021-05-03 MED ORDER — SODIUM CHLORIDE (PF) 0.9 % IJ SOLN
INTRAMUSCULAR | Status: AC
  Filled 2021-05-03: qty 50

## 2021-05-03 MED ORDER — IOHEXOL 350 MG/ML SOLN
75.0000 mL | Freq: Once | INTRAVENOUS | Status: AC | PRN
  Administered 2021-05-03: 75 mL via INTRAVENOUS

## 2021-05-04 LAB — CA 125: Cancer Antigen (CA) 125: 404 U/mL — ABNORMAL HIGH (ref 0.0–38.1)

## 2021-05-05 ENCOUNTER — Encounter: Payer: Self-pay | Admitting: Hematology and Oncology

## 2021-05-05 ENCOUNTER — Other Ambulatory Visit: Payer: Self-pay

## 2021-05-05 ENCOUNTER — Inpatient Hospital Stay: Payer: Medicare HMO | Admitting: Hematology and Oncology

## 2021-05-05 DIAGNOSIS — C561 Malignant neoplasm of right ovary: Secondary | ICD-10-CM

## 2021-05-05 NOTE — Assessment & Plan Note (Signed)
Currently, she is not symptomatic I have reviewed her blood work, tumor marker monitoring and review of CT imaging with the patient and her husband She has positive response to treatment However, due to her allergic reaction to oxaliplatin, FOLFOX is no longer her treatment options I reviewed the guidelines with the patient Previously, she has minimum benefit with carboplatin and paclitaxel but it is still considered a formal standard treatment I did not go into great details about other form of chemotherapy including carboplatin with gemcitabine, carboplatin with doxorubicin with or without bevacizumab We discussed the risk, benefits, side effects of treatment in general We also discussed the role of palliative care and we discussed prognosis with or without treatment At the end of the day, she is undecided Her husband has questions related to the role of second opinion and clinical trial which I have addressed to the best of my ability I recommend the patient to think about our discussion today and we will call her next week for an update about her decision

## 2021-05-05 NOTE — Progress Notes (Signed)
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[facility]) Currently, she is not symptomatic I have reviewed her blood work, tumor marker monitoring and review of CT imaging with the patient and her husband She has positive response to treatment However, due to her allergic reaction to oxaliplatin, FOLFOX is no longer her treatment options I reviewed the guidelines with the patient Previously, she has minimum benefit with carboplatin and paclitaxel but it is still considered a formal standard treatment I did not go into great details about other form of chemotherapy including carboplatin with gemcitabine, carboplatin with doxorubicin with or without bevacizumab We discussed the risk, benefits, side effects of treatment in general We also discussed the role of palliative care and we discussed prognosis with or without treatment At the end of the day, she is undecided Her husband has questions related to the role of second opinion and clinical trial which I have addressed to the best of my ability I recommend the patient to think about our discussion today and we will call her next week for an update about her decision  No orders of the defined types were placed in this encounter.   All questions were answered. The patient knows to call the clinic with any problems, questions or concerns. The total time spent in the appointment was 40 minutes encounter with patients including review of chart and various tests results, discussions about plan of care and coordination of care plan   [name], MD 05/05/2021 7:28 PM  INTERVAL HISTORY: Please see below for problem oriented charting. she returns for treatment follow-up with the patient and her husband The patient developed allergic reaction to oxaliplatin but recovered fully Currently, she is not symptomatic Denies  abdominal pain, nausea or changes in bowel habits  REVIEW OF SYSTEMS:   Constitutional: Denies fevers, chills or abnormal weight loss Eyes: Denies blurriness of vision Ears, nose, mouth, throat, and face: Denies mucositis or sore throat Respiratory: Denies cough, dyspnea or wheezes Cardiovascular: Denies palpitation, chest discomfort or lower extremity swelling Gastrointestinal:  Denies nausea, heartburn or change in bowel habits Skin: Denies abnormal skin rashes Lymphatics: Denies new lymphadenopathy or easy bruising Neurological:Denies numbness, tingling or new weaknesses Behavioral/Psych: Mood is stable, no new changes  All other systems were reviewed with the patient and are negative.  I have reviewed the past medical history, past surgical history, social history and family history with the patient and they are unchanged from previous note.  ALLERGIES:  is allergic to oxaliplatin.  MEDICATIONS:  Current Outpatient Medications  Medication Sig Dispense Refill   acetaminophen (TYLENOL) 500 MG tablet Take 500 mg by mouth every 8 (eight) hours as needed for mild pain or moderate pain.     atenolol (TENORMIN) 25 MG tablet Take 25 mg by mouth at bedtime.      atorvastatin (LIPITOR) 20 MG tablet Take 20 mg by mouth daily.     CALCIUM-MAGNESIUM-VITAMIN D PO Take 1 tablet by mouth daily.     loratadine (CLARITIN) 10 MG tablet Take 10 mg by mouth daily.     losartan (COZAAR) 50 MG tablet Take 50 mg by mouth at bedtime.     metFORMIN (GLUCOPHAGE) 1000 MG tablet Take 500 mg by mouth 2 (two) times daily.   0   Multiple Vitamin (MULTIVITAMIN WITH MINERALS) TABS tablet Take 1 tablet by mouth daily.     No  current facility-administered medications for this visit.    SUMMARY OF ONCOLOGIC HISTORY: Oncology History Overview Note  ER 15-20%, PR 5-10%, Her2/neu negative MMR normal Negative genetic testing HRD pos BRCA1 positive MSI Stable   Right ovarian epithelial cancer ([place])  07/30/2017  Imaging   US pelvis Ultrasound revealed a complex cystic mass in the right adnexa measuring 20 x 11 x 12 cm with multiple internal septations some of which are thick. The left adnexa measured 12.7 x 11.6 x 8.1 with low level echoes and soft tissue nodules    07/30/2017 Tumor Marker   Patient's tumor was tested for the following markers: CA-125 Results of the tumor marker test revealed 521.3   08/17/2017 Pathology Results   The malignant cells are positive for PAX-8, cytokeratin 7, estrogen receptor, and faintly positive for GATA-3. They are negative for p53, GCDFP, and cytokeratin 20. The finding are consistent with a gynecologic primary carcinoma. Additional studies can be performed upon clinician request.   08/17/2017 Procedure   Technically successful CT-guided left lower quadrant omental mass core biopsy.   08/20/2017 PET scan   1. Cystic masses arising from the pelvis. The nodular component of the RIGHT cystic mass is intensely hypermetabolic consistent with malignant ovarian neoplasm. 2. Extensive hypermetabolic peritoneal thickening in the lower abdomen and upper pelvis, upper abdomen, and upper abdominal precordial fat and paradiaphragmatic fat. 3. Retroperitoneal nodal metastasis adjacent to the IVC at the level the kidneys. 4. No evidence of metastatic disease in the thorax other small effusion on the LEFT and nodal metastasis in the fat superior to the diaphragm. 5. Mild metabolic activity associated the distal esophagus is favored benign esophagitis.   08/20/2017 Imaging   CT chest 1. Bilateral cardiophrenic angle nodal metastasis. No additional findings to suggest metastatic disease to the chest. 2. Small left pleural effusion. 3. Peritoneal carcinomatosis noted within the abdomen. 4. Hepatic steatosis.     08/24/2017 Cancer Staging   Staging form: Ovary, Fallopian Tube, and Primary Peritoneal Carcinoma, AJCC 8th Edition - Clinical: Stage IV (cT3, cN1, cM1) - Signed by [name], MD on 08/24/2017    08/31/2017 Tumor Marker   Patient's tumor was tested for the following markers: CA-125 Results of the tumor marker test revealed 819.9   09/26/2017 Adverse Reaction   She developed reaction to Taxol, managed successfully with additional premedications   10/17/2017 Tumor Marker   Patient's tumor was tested for the following markers: CA-125 Results of the tumor marker test revealed 374.4   10/31/2017 Imaging   1. No significant change in size of the large complex bilateral adnexal masses consistent with the known history of ovarian cancer. There is increased dependent density within the left-sided lesion. 2. Stable peritoneal carcinomatosis.  No significant ascites.  3. The bilateral pericardiac adenopathy has improved. No progressive thoracic metastatic disease. No pulmonary or osseous metastatic disease. 4. Suspicion of nonocclusive thrombus in the right internal jugular vein related to the right IJ port. Recommend further evaluation with Doppler ultrasound.   11/13/2017 Surgery   Preoperative Diagnosis: Ovarian cancer s/p neoadjuvant chemotherapy    Procedure(s) Performed:   Exploratory laparotomy  Bilateral salpingo-oophorectomy with radical tumor debulking for ovarian cancer  including retroperitoneal dissection, lysis of adhesions (enterolysis of small bowel in deep pelvis, left adnexal adhesions to sigmoid and omentum to LLQ) ~1 hour, ureterolysis. Omentectomy  Takedown of splenic flexure with removal of omental tumor in left upper quadrant    Specimens: Bilateral tubes and ovaries, omentum, splenic flexure nodule, right paracolic  gutter nodule, cecal nodule, right ureteral peritoneal nodule, small bowel mesentary.    Indication for Procedure:  Patient is s/p 3 cycles of chemotherapy with response based on CA125 and decreased mediastinal disease (to <1cm).   Operative Findings:  This represented an optimal cytoreduction with gross residual disease remaining in  the deep pelvis near the levator floor and possibly in the region of the right IP ligament/periappendicial region.   Upon entry a large ~20cm right tube/ovary, mostly cystic. Adherent rind along ileocecal region and appendix. Large cystic left tube/ovary ~10cm with sigmoid colon stretched across the mass and extension of the cystic lesion into the deep pelvis with residual palpable disease deep pelvis near levators. Estimate ~1cm or less on palpation, not visible disease. Omental caking in the LLQ adherent to pelvic sidewall. Omental disease noted RUQ separate. In addition ~1.5cm lesion in splenic flexure omentum. Evidence of prior diaphragmatic disease, no visible disease.       11/13/2017 Pathology Results   1. 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 - TUMOR INVOLVES THE OVARIAN SURFACE. - RIGHT FALLOPIAN TUBE IS INVOLVED. - SEE ONCOLOGY TABLE. - SEE NOTE. 2. Soft tissue mass, biopsy, cecal gutter nodule - METASTATIC MUCINOUS ADENOCARCINOMA. 3. Ovary and fallopian tube, left, left ovary and fallopian tube - METASTATIC MUCINOUS ADENOCARCINOMA TO LEFT OVARY AND FALLOPIAN TUBE. 4. Omentum, resection for tumor - METASTATIC MUCINOUS ADENOCARCINOMA TO OMENTUM. 5. Soft tissue mass, biopsy, splenic flexure nodule - METASTATIC MUCINOUS ADENOCARCINOMA. 6. Soft tissue mass, biopsy, cecal implant - METASTATIC MUCINOUS ADENOCARCINOMA. 7. Soft tissue mass, biopsy, right ureteral peritoneal implant - METASTATIC MUCINOUS ADENOCARCINOMA. 8. Mesentery, small bowel nodule - METASTATIC MUCINOUS ADENOCARCINOMA. Microscopic Comment 1. OVARY or FALLOPIAN TUBE or PRIMARY PERITONEUM: Procedure: Bilateral salpingo-oophorectomy. Specimen Integrity: Intact. Tumor Site: Right ovary. Ovarian Surface Involvement: Present. Fallopian Tube Surface Involvement: Present. Tumor Size: 20 cm. Histologic Type: Mucinous adenocarcinoma. Histologic Grade: Overall G2  (moderately differentiated) (focal areas of poor differentiation are present). Implants: Present. Other Tissue/ Organ Involvement: Left ovary, left fallopian tube and omentum. Largest Extrapelvic Peritoneal Focus: less than 2 cm. See note Peritoneal/Ascitic Fluid: N/A Treatment Effect: No definite or minimal response identified (chemotherapy response score 1 [CRS 1]) Regional Lymph Nodes No lymph nodes submitted or found Number of Lymph Nodes Examined: 0 Pathologic Stage Classification (pTNM, AJCC 8th Edition): ypT3b, ypNX. Representative Tumor Block: 1D and 1E. (NDK:gt, 11/15/17)   11/19/2017 Genetic Testing   Negative genetic testing on the [employer] Myrisk panel.  The [facility] gene panel offered by [employer] includes sequencing and deletion/duplication testing of the following 35 genes: APC, ATM, AXIN2, BARD1, BMPR1A, BRCA1, BRCA2, BRIP1, CHD1, CDK4, CDKN2A, CHEK2, EPCAM (large rearrangement only), HOXB13, (sequencing only), GALNT12, MLH1, MSH2, MSH3 (excluding repetitive portions of exon 1), MSH6, MUTYH, NBN, NTHL1, PALB2, PMS2, PTEN, RAD51C, RAD51D, RNF43, RPS20, SMAD4, STK11, and TP53. Sequencing was performed for select regions of POLE and POLD1, and large rearrangement analysis was performed for select regions of GREM1. The report date is November 19, 2017.  HRD tumor results indicate a BRCA1 mutation identified in the ovarian tumor causing genomic instability. The report date is 12/04/2017     Genetic Testing   Patient has genetic testing done for ER/PR and Her2/neu. Results revealed patient has the following: ER 15-20% PR 5-10% Her2/neu - negative    Genetic Testing   Patient has genetic testing done for MSI/MMR. Results revealed patient has the following mutation(s): MMR normal, MSI stable  12/10/2017 Imaging   1. Interval resection of large bilateral adnexal cystic masses. Residual soft tissue/tumor within bilateral adnexal regions as above. 2. Interval resolution  of previously noted omental cake which may reflect interval omentectomy. There is a new peritoneal deposit identified within the left upper quadrant of the abdomen involving the anterior surface of the spleen.   12/10/2017 Tumor Marker   Patient's tumor was tested for the following markers: CA-125 Results of the tumor marker test revealed 94.9   12/11/2017 - 02/20/2018 Chemotherapy   The patient had FOLFOX regimen for mucinous   01/09/2018 Tumor Marker   Patient's tumor was tested for the following markers: CA-125 Results of the tumor marker test revealed 37   01/31/2018 Imaging   Resolution of peritoneal soft tissue density along the anterior margin of the spleen since prior study. No evidence of residual or progressive metastatic disease.  Mild sigmoid diverticulosis, without radiographic evidence of diverticulitis. Resolution of small pericolonic abscess along the left lateral pelvic sidewall.  Mild hepatic steatosis.   02/06/2018 Tumor Marker   Patient's tumor was tested for the following markers: CA-125 Results of the tumor marker test revealed 36.7   03/06/2018 -  Chemotherapy   The patient is started on [name] as PARP maintenance   03/13/2018 Tumor Marker   Patient's tumor was tested for the following markers: CA-125 Results of the tumor marker test revealed 33.8   04/05/2018 Tumor Marker   Patient's tumor was tested for the following markers: CA-125 Results of the tumor marker test revealed 27.3   05/03/2018 Tumor Marker   Patient's tumor was tested for the following markers: CA-125 Results of the tumor marker test revealed 23.5   07/08/2018 Tumor Marker   Patient's tumor was tested for the following markers: CA-125 Results of the tumor marker test revealed 19.9   09/02/2018 Tumor Marker   Patient's tumor was tested for the following markers: CA-125 Results of the tumor marker test revealed 17   10/28/2018 Imaging   1. 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 Repeat abdominal MRI with and without IV gadolinium with MRCP is recommended in 2 years to ensure continued stability. This recommendation follows ACR consensus guidelines: Management of Incidental Pancreatic Cysts: A White Paper of the ACR Incidental Findings Committee. [place] 4166;06:[id number]. 2. Hepatic steatosis. 3. Additional incidental findings, as above.     10/28/2018 Tumor Marker   Patient's tumor was tested for the following markers: CA-125 Results of the tumor marker test revealed 16.9   03/03/2019 Tumor Marker   Patient's tumor was tested for the following markers: CA-125 Results of the tumor marker test revealed 12.7.   07/09/2019 Tumor Marker   Patient's tumor was tested for the following markers: CA-125 Results of the tumor marker test revealed 9.5.   09/02/2019 Tumor Marker   Patient's tumor was tested for the following markers: CA-125 Results of the tumor marker test revealed 11.   10/28/2019 Tumor Marker   Patient's tumor was tested for the following markers: CA-125 Results of the tumor marker test revealed 10.2   12/23/2019 Tumor Marker   Patient's tumor was tested for the following markers: CA-125 Results of the tumor marker test revealed 12.1   02/17/2020 Tumor Marker   Patient's tumor was tested for the following markers: CA-125 Results of the tumor marker test revealed 9.5   05/25/2020 Tumor Marker  Patient's tumor was tested for the following markers: CA-125 Results of the tumor marker test revealed 9.6   07/13/2020 Tumor Marker   Patient's tumor was tested for the following markers: CA-125 Results of the tumor marker test revealed 10.   11/01/2020 Tumor Marker   Patient's tumor was tested for the following markers: CA-125. Results of the tumor marker test revealed 18.1.   11/01/2020 Imaging   1. Cystic pancreatic lesions,  potentially small intraductal papillary mucinous neoplasm. Potential slight increase in size of 1 of these areas in particular near the tail of the pancreas with perhaps mild increase in ventral duct dilation but no main pancreatic duct dilation. Given patient history consider six-month follow-up to assess for stability. 7 mm as the largest area with potential increase in size from 3 mm. 2. Hepatic steatosis. 3. Postoperative changes in the midline of the abdomen.   01/25/2021 Tumor Marker   Patient's tumor was tested for the following markers: CA-125. Results of the tumor marker test revealed 220.   02/03/2021 Imaging   Increased peritoneal soft tissue thickening and nodularity within the lower abdomen and pelvis, with minimal ascites. This is consistent with progressive peritoneal carcinomatosis.   New small ventral abdominal wall hernias.   Colonic diverticulosis. No radiographic evidence of diverticulitis.   Aortic Atherosclerosis (ICD10-I70.0).   02/14/2021 - 04/11/2021 Chemotherapy   Patient is on Treatment Plan : Ovarian FOLFOX + Bevacizumab q14d      02/14/2021 Tumor Marker   Patient's tumor was tested for the following markers: CA-125. Results of the tumor marker test revealed 524.   03/14/2021 Tumor Marker   Patient's tumor was tested for the following markers: CA-125. Results of the tumor marker test revealed 356.   05/03/2021 Tumor Marker   Patient's tumor was tested for the following markers: CA-125. Results of the tumor marker test revealed 404.   05/05/2021 Imaging   1. Mild response to therapy of peritoneal and mesenteric disease as detailed above. 2. No new or progressive sites of disease identified. 3. Moderate basilar predominant subpleural reticulation with architectural distortion and traction bronchiolectasis. Favor developing drug toxicity. Correlate with pulmonary symptoms. 4. Coronary artery atherosclerosis. Aortic Atherosclerosis (ICD10-I70.0). 5.   Possible constipation.       PHYSICAL EXAMINATION: ECOG PERFORMANCE STATUS: 1 - Symptomatic but completely ambulatory  Vitals:   05/05/21 0828  BP: 135/61  Pulse: 78  Resp: 18  Temp: (!) 97.4 F (36.3 C)  SpO2: 96%   Filed Weights   05/05/21 0828  Weight: 141 lb 9.6 oz (64.2 kg)    GENERAL:alert, no distress and comfortable NEURO: alert & oriented x 3 with fluent speech, no focal motor/sensory deficits  LABORATORY DATA:  I have reviewed the data as listed    Component Value Date/Time   NA 138 05/03/2021 1205   K 4.1 05/03/2021 1205   CL 104 05/03/2021 1205   CO2 28 05/03/2021 1205   GLUCOSE 137 (H) 05/03/2021 1205   BUN 13 05/03/2021 1205   CREATININE 0.91 05/03/2021 1205   CREATININE 0.86 06/05/2014 0957   CALCIUM 9.1 05/03/2021 1205   PROT 8.0 05/03/2021 1205   ALBUMIN 3.5 05/03/2021 1205   AST 30 05/03/2021 1205   ALT 18 05/03/2021 1205   ALKPHOS 71 05/03/2021 1205   BILITOT 0.4 05/03/2021 1205   GFRNONAA >60 05/03/2021 1205   GFRAA >60 12/23/2019 0954   GFRAA >60 04/29/2019 0915    No results found for: SPEP, UPEP  Lab Results  Component Value Date  WBC 7.1 05/03/2021   NEUTROABS 2.5 05/03/2021   HGB 10.6 (L) 05/03/2021   HCT 33.8 (L) 05/03/2021   MCV 100.9 (H) 05/03/2021   PLT 149 (L) 05/03/2021      Chemistry      Component Value Date/Time   NA 138 05/03/2021 1205   K 4.1 05/03/2021 1205   CL 104 05/03/2021 1205   CO2 28 05/03/2021 1205   BUN 13 05/03/2021 1205   CREATININE 0.91 05/03/2021 1205   CREATININE 0.86 06/05/2014 0957      Component Value Date/Time   CALCIUM 9.1 05/03/2021 1205   ALKPHOS 71 05/03/2021 1205   AST 30 05/03/2021 1205   ALT 18 05/03/2021 1205   BILITOT 0.4 05/03/2021 1205       RADIOGRAPHIC STUDIES: I have reviewed multiple imaging studies with the patient and her husband I have personally reviewed the radiological images as listed and agreed with the findings in the report. CT CHEST ABDOMEN PELVIS W  CONTRAST  Result Date: 05/04/2021 CLINICAL DATA:  Ovarian cancer, status post chemotherapy. Restaging. EXAM: CT CHEST, ABDOMEN, AND PELVIS WITH CONTRAST TECHNIQUE: Multidetector CT imaging of the chest, abdomen and pelvis was performed following the standard protocol during bolus administration of intravenous contrast. RADIATION DOSE REDUCTION: This exam was performed according to the departmental dose-optimization program which includes automated exposure control, adjustment of the mA and/or kV according to patient size and/or use of iterative reconstruction technique. CONTRAST:  46m OMNIPAQUE IOHEXOL 350 MG/ML SOLN COMPARISON:  02/02/2021 abdominopelvic CT. Most recent chest CT of 11/14/2017 FINDINGS: CT CHEST FINDINGS Cardiovascular: Right Port-A-Cath tip high right atrium. Aortic atherosclerosis. 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 Normal heart size, without pericardial effusion. Lad and left circumflex coronary artery calcification. No central pulmonary embolism, on this non-dedicated study. Mediastinum/Nodes: No supraclavicular adenopathy. Mildly prominent left axillary and subpectoral nodes are similar. No mediastinal or hilar adenopathy. Lungs/Pleura: No pleural fluid. Right lower lobe calcified granuloma on 88/4. No typical findings of pulmonary metastasis. Basilar predominant subpleural reticulation with architectural distortion and traction bronchiolectasis. This is new since the chest CT of 2019 and new or significantly progressive compared to the abdominal CT of 02/02/2021. Musculoskeletal: No acute osseous abnormality. CT ABDOMEN PELVIS FINDINGS Hepatobiliary: Normal liver. Cholecystectomy, without biliary ductal dilatation. Pancreas: Normal, without mass or ductal dilatation. Spleen: Normal in size, without focal abnormality. Adrenals/Urinary Tract: Normal adrenal glands. Normal left kidney. Right renal interpolar 2.0 cm cyst. Normal urinary bladder.  Stomach/Bowel: Normal stomach, without wall thickening. Colonic stool burden suggests constipation. Normal small bowel caliber. Vascular/Lymphatic: Aortic atherosclerosis. No abdominal retroperitoneal adenopathy. Ileocolic mesenteric node or implant measures 1.4 x 1.4 cm on 90/2 versus 2.2 x 2.0 cm on the prior exam. Small bowel mesenteric implant or node within the anterior right pelvis measures 1.2 x 2.2 cm on 103/2 versus 1.4 x 2.5 cm on the prior exam (when remeasured). No pelvic sidewall adenopathy. Reproductive: Hysterectomy.  No adnexal mass. Other: Decrease in trace perihepatic ascites. Persistent nodularity along the right pericolic gutter including on 70/2. Left abdominal peritoneal thickening at up to 1.0 cm on 82/2, similar. Right pelvic peritoneal thickening is similar to slightly decreased with decrease in adjacent serosal fluid. Example 103/2. No new sites of peritoneal disease. Ventral abdominal wall areas of small bowel containing laxity or hernia are unchanged. Musculoskeletal: No acute osseous abnormality. IMPRESSION: 1. Mild response to therapy of peritoneal and mesenteric disease as detailed above. 2. No new or progressive sites of disease identified. 3. Moderate  basilar predominant subpleural reticulation with architectural distortion and traction bronchiolectasis. Favor developing drug toxicity. Correlate with pulmonary symptoms. 4. Coronary artery atherosclerosis. Aortic Atherosclerosis (ICD10-I70.0). 5.  Possible constipation. Electronically Signed   By: Abigail Miyamoto M.D.   On: 05/04/2021 09:13

## 2021-05-09 ENCOUNTER — Telehealth: Payer: Self-pay | Admitting: Oncology

## 2021-05-09 NOTE — Telephone Encounter (Signed)
Called Sabrina Vang regarding her decision to continue chemotherapy. She says has chosen not to continue chemotherapy.  She would like to focus on quality of life at this point.   She does have some questions for Dr. Alvy Bimler:  Will her port be removed? Will she remain a patient of Dr. Calton Dach or will she have a different doctor? Will she be returning to the [facility]?

## 2021-05-09 NOTE — Telephone Encounter (Signed)
Called Sabrina Vang and advised her of message below. She would like to schedule the virtual visit tomorrow.  Appointment scheduled for 9:00 am.    She also wanted to thank everyone at the [facility] for all the wonderful care she has received.

## 2021-05-09 NOTE — Telephone Encounter (Signed)
We can talk about those issues tomorrow? I can do virtual visit in the morning It depends; some patients want to leave it in or remove the port. I typically set up pallative care referral, some patients choose to continue coming in for evaluation once a month

## 2021-05-10 ENCOUNTER — Other Ambulatory Visit: Payer: Self-pay

## 2021-05-10 ENCOUNTER — Encounter: Payer: Self-pay | Admitting: Hematology and Oncology

## 2021-05-10 ENCOUNTER — Other Ambulatory Visit: Payer: Self-pay | Admitting: Hematology and Oncology

## 2021-05-10 ENCOUNTER — Telehealth (HOSPITAL_BASED_OUTPATIENT_CLINIC_OR_DEPARTMENT_OTHER): Payer: Medicare HMO | Admitting: Hematology and Oncology

## 2021-05-10 ENCOUNTER — Telehealth: Payer: Self-pay

## 2021-05-10 DIAGNOSIS — C561 Malignant neoplasm of right ovary: Secondary | ICD-10-CM | POA: Diagnosis not present

## 2021-05-10 NOTE — Assessment & Plan Note (Addendum)
I have reviewed goals of care with the patient and her husband Ultimately, she has made informed decision not to pursue further palliative treatment We discussed what to expect if her disease continues to progress Her estimated prognosis is around 6 months, without treatment We discussed the risk and benefits of referral to home-based palliative care as well as continue follow-up here at the cancer center We also discussed the risk and benefits of port removal Ultimately, my plan would be to see her back in a month for follow-up and refer her for home-based palliative care at the same time We discussed CODE STATUS and she would like to remain full code for now

## 2021-05-10 NOTE — Telephone Encounter (Signed)
Called and spoke with Olivia Mackie at [facility] Given verbal order for Palliative care. She will reach out to [place], she actually qualifies for Hospice and will see what [facility] prefers. [facility] will be the attending.

## 2021-05-10 NOTE — Progress Notes (Signed)
HEMATOLOGY-ONCOLOGY ELECTRONIC VISIT PROGRESS NOTE  Patient Care Team: [name], MD as PCP - General (Internal Medicine)  I connected with the patient via telephone conference and verified that I am speaking with the correct person using two identifiers. The patient's location is at home and I am providing care from the [facility] I discussed the limitations, risks, security and privacy concerns of performing an evaluation and management service by e-visits and the availability of in person appointments.  I also discussed with the patient that there may be a patient responsible charge related to this service. The patient expressed understanding and agreed to proceed.   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[place]) I have reviewed goals of care with the patient and her husband Ultimately, she has made informed decision not to pursue further palliative treatment We discussed what to expect if her disease continues to progress Her estimated prognosis is around 6 months, without treatment We discussed the risk and benefits of referral to home-based palliative care as well as continue follow-up here at the cancer center We also discussed the risk and benefits of port removal Ultimately, my plan would be to see her back in a month for follow-up and refer her for home-based palliative care at the same time We discussed CODE STATUS and she would like to remain full code for now  No orders of the defined types were placed in this encounter.   INTERVAL HISTORY: Please see below for problem oriented charting. 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 The left adnexa measured 12.7 x 11.6 x 8.1 with low level echoes and soft tissue nodules    07/30/2017 Tumor Marker   Patient's tumor was tested for the following markers: CA-125 Results of the tumor marker test revealed 521.3   08/17/2017 Pathology Results   The malignant cells are positive for PAX-8, cytokeratin 7, estrogen receptor, and faintly positive for GATA-3. They are negative for p53, GCDFP, and cytokeratin 20. The finding are consistent with a gynecologic primary carcinoma. Additional studies can be performed upon clinician request.   08/17/2017 Procedure   Technically successful CT-guided left lower quadrant omental mass core biopsy.   08/20/2017 PET scan   1. Cystic masses arising from the pelvis. The nodular component of the RIGHT cystic mass is intensely hypermetabolic consistent with malignant ovarian neoplasm. 2. Extensive hypermetabolic peritoneal thickening in the lower abdomen and upper pelvis, upper abdomen, and upper abdominal precordial fat and paradiaphragmatic fat. 3. Retroperitoneal nodal metastasis adjacent to the IVC at the level the kidneys. 4. No evidence of metastatic disease in the thorax other small effusion on the LEFT and nodal metastasis in the fat superior to the diaphragm. 5. Mild metabolic activity associated the distal esophagus is favored benign esophagitis.   08/20/2017 Imaging   CT chest 1. Bilateral cardiophrenic angle nodal metastasis. No additional findings to suggest  metastatic disease to the chest. 2. Small left pleural effusion. 3. Peritoneal carcinomatosis noted within the abdomen. 4. Hepatic steatosis.     08/24/2017 Cancer  Staging   Staging form: Ovary, Fallopian Tube, and Primary Peritoneal Carcinoma, AJCC 8th Edition - Clinical: Stage IV (cT3, cN1, cM1) - Signed by [name], MD on 08/24/2017    08/31/2017 Tumor Marker   Patient's tumor was tested for the following markers: CA-125 Results of the tumor marker test revealed 819.9   09/26/2017 Adverse Reaction   She developed reaction to Taxol, managed successfully with additional premedications   10/17/2017 Tumor Marker   Patient's tumor was tested for the following markers: CA-125 Results of the tumor marker test revealed 374.4   10/31/2017 Imaging   1. No significant change in size of the large complex bilateral adnexal masses consistent with the known history of ovarian cancer. There is increased dependent density within the left-sided lesion. 2. Stable peritoneal carcinomatosis.  No significant ascites.  3. The bilateral pericardiac adenopathy has improved. No progressive thoracic metastatic disease. No pulmonary or osseous metastatic disease. 4. Suspicion of nonocclusive thrombus in the right internal jugular vein related to the right IJ port. Recommend further evaluation with Doppler ultrasound.   11/13/2017 Surgery   Preoperative Diagnosis: Ovarian cancer s/p neoadjuvant chemotherapy    Procedure(s) Performed:   Exploratory laparotomy  Bilateral salpingo-oophorectomy with radical tumor debulking for ovarian cancer  including retroperitoneal dissection, lysis of adhesions (enterolysis of small bowel in deep pelvis, left adnexal adhesions to sigmoid and omentum to LLQ) ~1 hour, ureterolysis. Omentectomy  Takedown of splenic flexure with removal of omental tumor in left upper quadrant    Specimens: Bilateral tubes and ovaries, omentum, splenic flexure nodule, right paracolic gutter nodule, cecal nodule, right ureteral peritoneal nodule, small bowel mesentary.    Indication for Procedure:  Patient is s/p 3 cycles of chemotherapy with response based on  CA125 and decreased mediastinal disease (to <1cm).   Operative Findings:  This represented an optimal cytoreduction with gross residual disease remaining in the deep pelvis near the levator floor and possibly in the region of the right IP ligament/periappendicial region.   Upon entry a large ~20cm right tube/ovary, mostly cystic. Adherent rind along ileocecal region and appendix. Large cystic left tube/ovary ~10cm with sigmoid colon stretched across the mass and extension of the cystic lesion into the deep pelvis with residual palpable disease deep pelvis near levators. Estimate ~1cm or less on palpation, not visible disease. Omental caking in the LLQ adherent to pelvic sidewall. Omental disease noted RUQ separate. In addition ~1.5cm lesion in splenic flexure omentum. Evidence of prior diaphragmatic disease, no visible disease.       11/13/2017 Pathology Results   1. 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 - TUMOR INVOLVES THE OVARIAN SURFACE. - RIGHT FALLOPIAN TUBE IS INVOLVED. - SEE ONCOLOGY TABLE. - SEE NOTE. 2. Soft tissue mass, biopsy, cecal gutter nodule - METASTATIC MUCINOUS ADENOCARCINOMA. 3. Ovary and fallopian tube, left, left ovary and fallopian tube - METASTATIC MUCINOUS ADENOCARCINOMA TO LEFT OVARY AND FALLOPIAN TUBE. 4. Omentum, resection for tumor - METASTATIC MUCINOUS ADENOCARCINOMA TO OMENTUM. 5. Soft tissue mass, biopsy, splenic flexure nodule - METASTATIC MUCINOUS ADENOCARCINOMA. 6. Soft tissue mass, biopsy, cecal implant - METASTATIC MUCINOUS ADENOCARCINOMA. 7. Soft tissue mass, biopsy, right ureteral peritoneal implant - METASTATIC MUCINOUS ADENOCARCINOMA. 8. Mesentery, small bowel nodule - METASTATIC MUCINOUS ADENOCARCINOMA. Microscopic Comment 1. OVARY or FALLOPIAN TUBE or PRIMARY PERITONEUM: Procedure:  Bilateral salpingo-oophorectomy. Specimen Integrity: Intact. Tumor Site: Right ovary. Ovarian Surface  Involvement: Present. Fallopian Tube Surface Involvement: Present. Tumor Size: 20 cm. Histologic Type: Mucinous adenocarcinoma. Histologic Grade: Overall G2 (moderately differentiated) (focal areas of poor differentiation are present). Implants: Present. Other Tissue/ Organ Involvement: Left ovary, left fallopian tube and omentum. Largest Extrapelvic Peritoneal Focus: less than 2 cm. See note Peritoneal/Ascitic Fluid: N/A Treatment Effect: No definite or minimal response identified (chemotherapy response score 1 [CRS 1]) Regional Lymph Nodes No lymph nodes submitted or found Number of Lymph Nodes Examined: 0 Pathologic Stage Classification (pTNM, AJCC 8th Edition): ypT3b, ypNX. Representative Tumor Block: 1D and 1E. (NDK:gt, 11/15/17)   11/19/2017 Genetic Testing   Negative genetic testing on the [employer] Myrisk panel.  The [facility] gene panel offered by [employer] includes sequencing and deletion/duplication testing of the following 35 genes: APC, ATM, AXIN2, BARD1, BMPR1A, BRCA1, BRCA2, BRIP1, CHD1, CDK4, CDKN2A, CHEK2, EPCAM (large rearrangement only), HOXB13, (sequencing only), GALNT12, MLH1, MSH2, MSH3 (excluding repetitive portions of exon 1), MSH6, MUTYH, NBN, NTHL1, PALB2, PMS2, PTEN, RAD51C, RAD51D, RNF43, RPS20, SMAD4, STK11, and TP53. Sequencing was performed for select regions of POLE and POLD1, and large rearrangement analysis was performed for select regions of GREM1. The report date is November 19, 2017.  HRD tumor results indicate a BRCA1 mutation identified in the ovarian tumor causing genomic instability. The report date is 12/04/2017     Genetic Testing   Patient has genetic testing done for ER/PR and Her2/neu. Results revealed patient has the following: ER 15-20% PR 5-10% Her2/neu - negative    Genetic Testing   Patient has genetic testing done for MSI/MMR. Results revealed patient has the following mutation(s): MMR normal, MSI stable   12/10/2017 Imaging    1. Interval resection of large bilateral adnexal cystic masses. Residual soft tissue/tumor within bilateral adnexal regions as above. 2. Interval resolution of previously noted omental cake which may reflect interval omentectomy. There is a new peritoneal deposit identified within the left upper quadrant of the abdomen involving the anterior surface of the spleen.   12/10/2017 Tumor Marker   Patient's tumor was tested for the following markers: CA-125 Results of the tumor marker test revealed 94.9   12/11/2017 - 02/20/2018 Chemotherapy   The patient had FOLFOX regimen for mucinous   01/09/2018 Tumor Marker   Patient's tumor was tested for the following markers: CA-125 Results of the tumor marker test revealed 37   01/31/2018 Imaging   Resolution of peritoneal soft tissue density along the anterior margin of the spleen since prior study. No evidence of residual or progressive metastatic disease.  Mild sigmoid diverticulosis, without radiographic evidence of diverticulitis. Resolution of small pericolonic abscess along the left lateral pelvic sidewall.  Mild hepatic steatosis.   02/06/2018 Tumor Marker   Patient's tumor was tested for the following markers: CA-125 Results of the tumor marker test revealed 36.7   03/06/2018 -  Chemotherapy   The patient is started on [name] as PARP maintenance   03/13/2018 Tumor Marker   Patient's tumor was tested for the following markers: CA-125 Results of the tumor marker test revealed 33.8   04/05/2018 Tumor Marker   Patient's tumor was tested for the following markers: CA-125 Results of the tumor marker test revealed 27.3   05/03/2018 Tumor Marker   Patient's tumor was tested for the following markers: CA-125 Results of the tumor marker test revealed 23.5   07/08/2018 Tumor Marker   Patient's tumor was tested for the following markers:  CA-125 Results of the tumor marker test revealed 19.9   09/02/2018 Tumor Marker   Patient's tumor was  tested for the following markers: CA-125 Results of the tumor marker test revealed 17   10/28/2018 Imaging   1. 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 Repeat abdominal MRI with and without IV gadolinium with MRCP is recommended in 2 years to ensure continued stability. This recommendation follows ACR consensus guidelines: Management of Incidental Pancreatic Cysts: A White Paper of the ACR Incidental Findings Committee. [place] 2778;24:235-361. 2. Hepatic steatosis. 3. Additional incidental findings, as above.     10/28/2018 Tumor Marker   Patient's tumor was tested for the following markers: CA-125 Results of the tumor marker test revealed 16.9   03/03/2019 Tumor Marker   Patient's tumor was tested for the following markers: CA-125 Results of the tumor marker test revealed 12.7.   07/09/2019 Tumor Marker   Patient's tumor was tested for the following markers: CA-125 Results of the tumor marker test revealed 9.5.   09/02/2019 Tumor Marker   Patient's tumor was tested for the following markers: CA-125 Results of the tumor marker test revealed 11.   10/28/2019 Tumor Marker   Patient's tumor was tested for the following markers: CA-125 Results of the tumor marker test revealed 10.2   12/23/2019 Tumor Marker   Patient's tumor was tested for the following markers: CA-125 Results of the tumor marker test revealed 12.1   02/17/2020 Tumor Marker   Patient's tumor was tested for the following markers: CA-125 Results of the tumor marker test revealed 9.5   05/25/2020 Tumor Marker   Patient's tumor was tested for the following markers: CA-125 Results of the tumor marker test revealed 9.6   07/13/2020 Tumor Marker   Patient's tumor was tested for the following markers: CA-125 Results of the tumor marker test revealed 10.   11/01/2020 Tumor Marker    Patient's tumor was tested for the following markers: CA-125. Results of the tumor marker test revealed 18.1.   11/01/2020 Imaging   1. Cystic pancreatic lesions, potentially small intraductal papillary mucinous neoplasm. Potential slight increase in size of 1 of these areas in particular near the tail of the pancreas with perhaps mild increase in ventral duct dilation but no main pancreatic duct dilation. Given patient history consider six-month follow-up to assess for stability. 7 mm as the largest area with potential increase in size from 3 mm. 2. Hepatic steatosis. 3. Postoperative changes in the midline of the abdomen.   01/25/2021 Tumor Marker   Patient's tumor was tested for the following markers: CA-125. Results of the tumor marker test revealed 220.   02/03/2021 Imaging   Increased peritoneal soft tissue thickening and nodularity within the lower abdomen and pelvis, with minimal ascites. This is consistent with progressive peritoneal carcinomatosis.   New small ventral abdominal wall hernias.   Colonic diverticulosis. No radiographic evidence of diverticulitis.   Aortic Atherosclerosis (ICD10-I70.0).   02/14/2021 - 04/11/2021 Chemotherapy   Patient is on Treatment Plan : Ovarian FOLFOX + Bevacizumab q14d      02/14/2021 Tumor Marker   Patient's tumor was tested for the following markers: CA-125. Results of the tumor marker test revealed 524.   03/14/2021 Tumor Marker   Patient's tumor was tested for the following markers: CA-125. Results of the tumor marker test revealed 356.   05/03/2021 Tumor Marker   Patient's  tumor was tested for the following markers: CA-125. Results of the tumor marker test revealed 404.   05/05/2021 Imaging   1. Mild response to therapy of peritoneal and mesenteric disease as detailed above. 2. No new or progressive sites of disease identified. 3. Moderate basilar predominant subpleural reticulation with architectural distortion and traction  bronchiolectasis. Favor developing drug toxicity. Correlate with pulmonary symptoms. 4. Coronary artery atherosclerosis. Aortic Atherosclerosis (ICD10-I70.0). 5.  Possible constipation.       REVIEW OF SYSTEMS:   Constitutional: Denies fevers, chills or abnormal weight loss Eyes: Denies blurriness of vision Ears, nose, mouth, throat, and face: Denies mucositis or sore throat Respiratory: Denies cough, dyspnea or wheezes Cardiovascular: Denies palpitation, chest discomfort Gastrointestinal:  Denies nausea, heartburn or change in bowel habits Skin: Denies abnormal skin rashes Lymphatics: Denies new lymphadenopathy or easy bruising Neurological:Denies numbness, tingling or new weaknesses Behavioral/Psych: Mood is stable, no new changes  Extremities: No lower extremity edema All other systems were reviewed with the patient and are negative.  I have reviewed the past medical history, past surgical history, social history and family history with the patient and they are unchanged from previous note.  ALLERGIES:  is allergic to oxaliplatin.  MEDICATIONS:  Current Outpatient Medications  Medication Sig Dispense Refill   acetaminophen (TYLENOL) 500 MG tablet Take 500 mg by mouth every 8 (eight) hours as needed for mild pain or moderate pain.     atenolol (TENORMIN) 25 MG tablet Take 25 mg by mouth at bedtime.      atorvastatin (LIPITOR) 20 MG tablet Take 20 mg by mouth daily.     CALCIUM-MAGNESIUM-VITAMIN D PO Take 1 tablet by mouth daily.     loratadine (CLARITIN) 10 MG tablet Take 10 mg by mouth daily.     losartan (COZAAR) 50 MG tablet Take 50 mg by mouth at bedtime.     metFORMIN (GLUCOPHAGE) 1000 MG tablet Take 500 mg by mouth 2 (two) times daily.   0   Multiple Vitamin (MULTIVITAMIN WITH MINERALS) TABS tablet Take 1 tablet by mouth daily.     No current facility-administered medications for this visit.    PHYSICAL EXAMINATION: ECOG PERFORMANCE STATUS: 1 - Symptomatic but  completely ambulatory  LABORATORY DATA:  I have reviewed the data as listed CMP Latest Ref Rng & Units 05/03/2021 04/11/2021 03/29/2021  Glucose 70 - 99 mg/dL 137(H) 206(H) 151(H)  BUN 8 - 23 mg/dL _0 Creatinine 0.44 - 1.00 mg/dL 0.91 0.94 0.94  Sodium 135 - 145 mmol/L 138 139 135  Potassium 3.5 - 5.1 mmol/L 4.1 4.2 4.2  Chloride 98 - 111 mmol/L 104 106 102  CO2 22 - 32 mmol/L _1 Calcium 8.9 - 10.3 mg/dL 9.1 8.6(L) 9.0  Total Protein 6.5 - 8.1 g/dL 8.0 7.3 7.7  Total Bilirubin 0.3 - 1.2 mg/dL 0.4 0.4 0.6  Alkaline Phos 38 - 126 U/L 71 75 62  AST 15 - 41 U/L _2 ALT 0 - 44 U/L _3 Lab Results  Component Value Date   WBC 7.1 05/03/2021   HGB 10.6 (L) 05/03/2021   HCT 33.8 (L) 05/03/2021   MCV 100.9 (H) 05/03/2021   PLT 149 (L) 05/03/2021   NEUTROABS 2.5 05/03/2021     RADIOGRAPHIC STUDIES: I have personally reviewed the radiological images as listed and agreed with the findings in the report. CT CHEST ABDOMEN PELVIS W CONTRAST  Result Date: 05/04/2021 CLINICAL DATA:  Ovarian  cancer, status post chemotherapy. Restaging. EXAM: CT CHEST, ABDOMEN, AND PELVIS WITH CONTRAST TECHNIQUE: Multidetector CT imaging of the chest, abdomen and pelvis was performed following the standard protocol during bolus administration of intravenous contrast. RADIATION DOSE REDUCTION: This exam was performed according to the departmental dose-optimization program which includes automated exposure control, adjustment of the mA and/or kV according to patient size and/or use of iterative reconstruction technique. CONTRAST:  74m OMNIPAQUE IOHEXOL 350 MG/ML SOLN COMPARISON:  02/02/2021 abdominopelvic CT. Most recent chest CT of 11/14/2017 FINDINGS: CT CHEST FINDINGS Cardiovascular: Right Port-A-Cath tip high right atrium. Aortic atherosclerosis. 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 Normal heart size, without pericardial effusion. Lad and left circumflex  coronary artery calcification. No central pulmonary embolism, on this non-dedicated study. Mediastinum/Nodes: No supraclavicular adenopathy. Mildly prominent left axillary and subpectoral nodes are similar. No mediastinal or hilar adenopathy. Lungs/Pleura: No pleural fluid. Right lower lobe calcified granuloma on 88/4. No typical findings of pulmonary metastasis. Basilar predominant subpleural reticulation with architectural distortion and traction bronchiolectasis. This is new since the chest CT of 2019 and new or significantly progressive compared to the abdominal CT of 02/02/2021. Musculoskeletal: No acute osseous abnormality. CT ABDOMEN PELVIS FINDINGS Hepatobiliary: Normal liver. Cholecystectomy, without biliary ductal dilatation. Pancreas: Normal, without mass or ductal dilatation. Spleen: Normal in size, without focal abnormality. Adrenals/Urinary Tract: Normal adrenal glands. Normal left kidney. Right renal interpolar 2.0 cm cyst. Normal urinary bladder. Stomach/Bowel: Normal stomach, without wall thickening. Colonic stool burden suggests constipation. Normal small bowel caliber. Vascular/Lymphatic: Aortic atherosclerosis. No abdominal retroperitoneal adenopathy. Ileocolic mesenteric node or implant measures 1.4 x 1.4 cm on 90/2 versus 2.2 x 2.0 cm on the prior exam. Small bowel mesenteric implant or node within the anterior right pelvis measures 1.2 x 2.2 cm on 103/2 versus 1.4 x 2.5 cm on the prior exam (when remeasured). No pelvic sidewall adenopathy. Reproductive: Hysterectomy.  No adnexal mass. Other: Decrease in trace perihepatic ascites. Persistent nodularity along the right pericolic gutter including on 70/2. Left abdominal peritoneal thickening at up to 1.0 cm on 82/2, similar. Right pelvic peritoneal thickening is similar to slightly decreased with decrease in adjacent serosal fluid. Example 103/2. No new sites of peritoneal disease. Ventral abdominal wall areas of small bowel containing laxity or  hernia are unchanged. Musculoskeletal: No acute osseous abnormality. IMPRESSION: 1. Mild response to therapy of peritoneal and mesenteric disease as detailed above. 2. No new or progressive sites of disease identified. 3. Moderate basilar predominant subpleural reticulation with architectural distortion and traction bronchiolectasis. Favor developing drug toxicity. Correlate with pulmonary symptoms. 4. Coronary artery atherosclerosis. Aortic Atherosclerosis (ICD10-I70.0). 5.  Possible constipation. Electronically Signed   By: KAbigail MiyamotoM.D.   On: 05/04/2021 09:13    I discussed the assessment and treatment plan with the patient. The patient was provided an opportunity to ask questions and all were answered. The patient agreed with the plan and demonstrated an understanding of the instructions. The patient was advised to call back or seek an in-person evaluation if the symptoms worsen or if the condition fails to improve as anticipated.    I spent 30 minutes for the appointment reviewing test results, discuss management and coordination of care.  NHeath Lark MD 05/10/2021 2:36 PM

## 2021-05-12 ENCOUNTER — Telehealth: Payer: Self-pay | Admitting: Nurse Practitioner

## 2021-05-12 ENCOUNTER — Encounter: Payer: Self-pay | Admitting: Hematology and Oncology

## 2021-05-12 NOTE — Telephone Encounter (Signed)
Attempted to contact patient at listed home # with no answer - left message with reason for call along with my name and call back number requesting a return call to schedule visit.

## 2021-05-12 NOTE — Telephone Encounter (Signed)
Spoke with patient regarding the Palliative referral and she stated that she got my voicemail message and said that she wasn't at home at the time of my call and said that she would call me back tomorrow to schedule visit.

## 2021-05-17 ENCOUNTER — Other Ambulatory Visit: Payer: Medicare HMO

## 2021-05-17 ENCOUNTER — Ambulatory Visit: Payer: Medicare HMO | Admitting: Hematology and Oncology

## 2021-05-19 ENCOUNTER — Telehealth: Payer: Self-pay | Admitting: Nurse Practitioner

## 2021-05-19 NOTE — Telephone Encounter (Signed)
Returned call to patient and discussed the Palliative referral/services with her and she was in agreement with beginning services with [place].  I have scheduled a Telehealth Consult for 05/24/21 @ 2 PM.

## 2021-05-24 ENCOUNTER — Encounter: Payer: Self-pay | Admitting: Hematology and Oncology

## 2021-05-24 ENCOUNTER — Other Ambulatory Visit: Payer: Self-pay

## 2021-05-24 ENCOUNTER — Other Ambulatory Visit: Payer: Medicare HMO | Admitting: Nurse Practitioner

## 2021-05-24 ENCOUNTER — Encounter: Payer: Self-pay | Admitting: Nurse Practitioner

## 2021-05-24 DIAGNOSIS — Z515 Encounter for palliative care: Secondary | ICD-10-CM

## 2021-05-24 DIAGNOSIS — F4321 Adjustment disorder with depressed mood: Secondary | ICD-10-CM

## 2021-05-24 DIAGNOSIS — C561 Malignant neoplasm of right ovary: Secondary | ICD-10-CM

## 2021-05-24 NOTE — Progress Notes (Signed)
[facility] Consult Note Telephone: [phone]  Fax: [phone]   Date of encounter: 05/24/21 5:16 PM PATIENT NAME: Sabrina Vang [address] [place] [address]   [id number] (home)  DOB: 11-17-1950 MRN: [id number] PRIMARY CARE PROVIDER:    [name], MD,  [address] [place] [address] [phone]  REFERRING PROVIDER:   [name], MD [address] [place] [employer],  [place] [address] [phone]  RESPONSIBLE PARTY:    Contact Information     Name Relation Home Work Mobile   Granieri,Bill Spouse [phone]  [phone]   Robinson,Crystal Relative [phone]     Rosendo Gros Relative [phone]        Due to the COVID-19 crisis, this visit was done via telemedicine from my office and it was initiated and consent by this patient and or family.  I connected with  Nonda Lou OR PROXY on 05/24/21 by a telephonic as video not available enabled telemedicine application and verified that I am speaking with the correct person   I discussed the limitations of evaluation and management by telemedicine. The patient expressed understanding and agreed to proceed.  . Palliative Care was asked to follow this patient by consultation request of  [name], MD to address advance care planning and complex medical decision making. This is the initial visit.                          ASSESSMENT AND PLAN / RECOMMENDATIONS:  Advance Care Planning/Goals of Care: Goals include to maximize quality of life and symptom management. Patient/health care surrogate gave his/her permission to discuss.Our advance care planning conversation included a discussion about:    The value and importance of advance care planning  Experiences with loved ones who have been seriously ill or have died  Exploration of personal, cultural or spiritual beliefs that might influence medical decisions  Exploration of goals of care in the event  of a sudden injury or illness  Identification  of a healthcare agent  Review and updating or creation of an  advance directive document . Decision not to resuscitate or to de-escalate disease focused treatments due to poor prognosis. CODE STATUS: Full code  Symptom Management/Plan: 1. Advance Care Planning; Full code; wishes are no further chemotherapy treatments; discussed hospice vs pc, Ms. Tavis wishes at this time since she is independent, asymptomatic to continue with PC, when develops symptoms will re-visit hospice.   2. Goals of Care: Goals include to maximize quality of life and symptom management. Our advance care planning conversation included a discussion about:    The value and importance of advance care planning  Exploration of personal, cultural or spiritual beliefs that might influence medical decisions  Exploration of goals of care in the event of a sudden injury or illness  Identification and preparation of a healthcare agent  Review and updating or creation of an advance directive document.  3. Palliative care encounter; Palliative care encounter; Palliative medicine team will continue to support patient, patient's family, and medical team. Visit consisted of counseling and education dealing with the complex and emotionally intense issues of symptom management and palliative care in the setting of serious and potentially life-threatening illness  4. 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 Discussed strong faith, support system, currently needs met with support, if needed would benefit to work with  grief counselor if Ms. Oshields wishes especially since her adult son is suffering from Huntington's diease with recent placement in facility.  5. f/u 1 month for ongoing monitoring chronic disease progression, ongoing discussions complex medical decision making  Follow up  Palliative Care Visit: Palliative care will continue to follow for complex medical decision making, advance care planning, and clarification of goals. Return 4 with PC RN weeks or prn.  I spent 65 minutes providing this consultation. More than 50% of the time in this consultation was spent in counseling and care coordination.  PPS: 60%  HOSPICE ELIGIBILITY/DIAGNOSIS: Sabrina Vang wishes to wait until she becomes symptomatic before evaluation for hospice   Chief Complaint: Initial palliative consult for complex medical decision making  HISTORY OF PRESENT ILLNESS:  Sabrina Vang is a 71 y.o. year old female  with multiple medical problems including Right ovarian epithelial cancer, mediastinal adenopathy, HTN, hepatic steatosis, DM, right carpel tunnel, thrombocytopenia, pancytopenia, deficiency anemia. I called Sabrina Vang for telemedicine telephonic as video not available. We talked about purpose pc visit, Sabrina Vang in agreement. We talked about past medical history, life review. Sabrina Vang endorses she just placed her adult son in a facility with Huntington's disease. We talked about her second adult son is supportive and husband. We talked about dx of cancer with treaments she has endure. We talked about last visit with Dr Alvy Bimler with decision not to pursue further treatments. Sabrina Vang endorses since stopping treatments symptoms have improved and currently symptom free. We talked about terminal dx of cancer. We talked about role pc in poc. We talked about medical goals for care currently wishes are to be a full code, though would not want to be on a ventilator an extended amount of time. We talked about option of hospice services, though at current time Sabrina Vang is asymptomatic, independent, and felling well, good appetite, no further weight loss wishes are to stay with PC. Will revisit hospice option when develops further symptoms. Currently pain free, sleeping well. Sabrina Vang endorses she and her husband  just returned from [place] to [place] for a vacation. Sabrina Vang endorses she wants quality of life to focus spending time with family, their grandchildren and her list of wishes. Ms. Overbay endorses she has her faith, helps others, grieving with coping strategies. Will continue to follow with next pc visit in 4 weeks with [facility] RN. Therapeutic listening, emotional support provided. Ms. Anzaldo in agreement, was thankful for PC visit, discussion.   SUMMARY OF ONCOLOGIC HISTORY:     Oncology History Overview Note   ER 15-20%, PR 5-10%, Her2/neu negative MMR normal Negative genetic testing HRD pos BRCA1 positive MSI Stable    Right ovarian epithelial cancer ([place])   07/30/2017 Imaging     US pelvis Ultrasound revealed a complex cystic mass in the right adnexa measuring 20 x 11 x 12 cm with multiple internal septations some of which are thick. The left adnexa measured 12.7 x 11.6 x 8.1 with low level echoes and soft tissue nodules       07/30/2017 Tumor Marker     Patient's tumor was tested for the following markers: CA-125 Results of the tumor marker test revealed 521.3     08/17/2017 Pathology Results     The malignant cells are positive for PAX-8, cytokeratin 7, estrogen receptor, and faintly positive for GATA-3. They are negative for p53, GCDFP, and cytokeratin 20. The finding are consistent with a gynecologic primary carcinoma. Additional studies can be performed  upon clinician request.     08/17/2017 Procedure     Technically successful CT-guided left lower quadrant omental mass core biopsy.     08/20/2017 PET scan     1. Cystic masses arising from the pelvis. The nodular component of the RIGHT cystic mass is intensely hypermetabolic consistent with malignant ovarian neoplasm. 2. Extensive hypermetabolic peritoneal thickening in the lower abdomen and upper pelvis, upper abdomen, and upper abdominal precordial fat and paradiaphragmatic fat. 3. Retroperitoneal nodal metastasis adjacent to the IVC at  the level the kidneys. 4. No evidence of metastatic disease in the thorax other small effusion on the LEFT and nodal metastasis in the fat superior to the diaphragm. 5. Mild metabolic activity associated the distal esophagus is favored benign esophagitis.     08/20/2017 Imaging     CT chest 1. Bilateral cardiophrenic angle nodal metastasis. No additional findings to suggest metastatic disease to the chest. 2. Small left pleural effusion. 3. Peritoneal carcinomatosis noted within the abdomen. 4. Hepatic steatosis.       08/24/2017 Cancer Staging     Staging form: Ovary, Fallopian Tube, and Primary Peritoneal Carcinoma, AJCC 8th Edition - Clinical: Stage IV (cT3, cN1, cM1) - Signed by [name], MD on 08/24/2017       08/31/2017 Tumor Marker     Patient's tumor was tested for the following markers: CA-125 Results of the tumor marker test revealed 819.9     09/26/2017 Adverse Reaction     She developed reaction to Taxol, managed successfully with additional premedications     10/17/2017 Tumor Marker     Patient's tumor was tested for the following markers: CA-125 Results of the tumor marker test revealed 374.4     10/31/2017 Imaging     1. No significant change in size of the large complex bilateral adnexal masses consistent with the known history of ovarian cancer. There is increased dependent density within the left-sided lesion. 2. Stable peritoneal carcinomatosis.  No significant ascites.  3. The bilateral pericardiac adenopathy has improved. No progressive thoracic metastatic disease. No pulmonary or osseous metastatic disease. 4. Suspicion of nonocclusive thrombus in the right internal jugular vein related to the right IJ port. Recommend further evaluation with Doppler ultrasound.     11/13/2017 Surgery     Preoperative Diagnosis: Ovarian cancer s/p neoadjuvant chemotherapy    Procedure(s) Performed:   Exploratory laparotomy  Bilateral salpingo-oophorectomy with radical tumor  debulking for ovarian cancer  including retroperitoneal dissection, lysis of adhesions (enterolysis of small bowel in deep pelvis, left adnexal adhesions to sigmoid and omentum to LLQ) ~1 hour, ureterolysis. Omentectomy  Takedown of splenic flexure with removal of omental tumor in left upper quadrant    Specimens: Bilateral tubes and ovaries, omentum, splenic flexure nodule, right paracolic gutter nodule, cecal nodule, right ureteral peritoneal nodule, small bowel mesentary.    Indication for Procedure:  Patient is s/p 3 cycles of chemotherapy with response based on CA125 and decreased mediastinal disease (to <1cm).   Operative Findings:  This represented an optimal cytoreduction with gross residual disease remaining in the deep pelvis near the levator floor and possibly in the region of the right IP ligament/periappendicial region.   Upon entry a large ~20cm right tube/ovary, mostly cystic. Adherent rind along ileocecal region and appendix. Large cystic left tube/ovary ~10cm with sigmoid colon stretched across the mass and extension of the cystic lesion into the deep pelvis with residual palpable disease deep pelvis near levators. Estimate ~1cm or less on palpation, not visible  disease. Omental caking in the LLQ adherent to pelvic sidewall. Omental disease noted RUQ separate. In addition ~1.5cm lesion in splenic flexure omentum. Evidence of prior diaphragmatic disease, no visible disease.           11/13/2017 Pathology Results     1. 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 - TUMOR INVOLVES THE OVARIAN SURFACE. - RIGHT FALLOPIAN TUBE IS INVOLVED. - SEE ONCOLOGY TABLE. - SEE NOTE. 2. Soft tissue mass, biopsy, cecal gutter nodule - METASTATIC MUCINOUS ADENOCARCINOMA. 3. Ovary and fallopian tube, left, left ovary and fallopian tube - METASTATIC MUCINOUS ADENOCARCINOMA TO LEFT OVARY AND FALLOPIAN TUBE. 4. Omentum, resection for tumor -  METASTATIC MUCINOUS ADENOCARCINOMA TO OMENTUM. 5. Soft tissue mass, biopsy, splenic flexure nodule - METASTATIC MUCINOUS ADENOCARCINOMA. 6. Soft tissue mass, biopsy, cecal implant - METASTATIC MUCINOUS ADENOCARCINOMA. 7. Soft tissue mass, biopsy, right ureteral peritoneal implant - METASTATIC MUCINOUS ADENOCARCINOMA. 8. Mesentery, small bowel nodule - METASTATIC MUCINOUS ADENOCARCINOMA. Microscopic Comment 1. OVARY or FALLOPIAN TUBE or PRIMARY PERITONEUM: Procedure: Bilateral salpingo-oophorectomy. Specimen Integrity: Intact. Tumor Site: Right ovary. Ovarian Surface Involvement: Present. Fallopian Tube Surface Involvement: Present. Tumor Size: 20 cm. Histologic Type: Mucinous adenocarcinoma. Histologic Grade: Overall G2 (moderately differentiated) (focal areas of poor differentiation are present). Implants: Present. Other Tissue/ Organ Involvement: Left ovary, left fallopian tube and omentum. Largest Extrapelvic Peritoneal Focus: less than 2 cm. See note Peritoneal/Ascitic Fluid: N/A Treatment Effect: No definite or minimal response identified (chemotherapy response score 1 [CRS 1]) Regional Lymph Nodes No lymph nodes submitted or found Number of Lymph Nodes Examined: 0 Pathologic Stage Classification (pTNM, AJCC 8th Edition): ypT3b, ypNX. Representative Tumor Block: 1D and 1E. (NDK:gt, 11/15/17)     11/19/2017 Genetic Testing     Negative genetic testing on the [employer] Myrisk panel.  The [facility] gene panel offered by [employer] includes sequencing and deletion/duplication testing of the following 35 genes: APC, ATM, AXIN2, BARD1, BMPR1A, BRCA1, BRCA2, BRIP1, CHD1, CDK4, CDKN2A, CHEK2, EPCAM (large rearrangement only), HOXB13, (sequencing only), GALNT12, MLH1, MSH2, MSH3 (excluding repetitive portions of exon 1), MSH6, MUTYH, NBN, NTHL1, PALB2, PMS2, PTEN, RAD51C, RAD51D, RNF43, RPS20, SMAD4, STK11, and TP53. Sequencing was performed for select regions of POLE and POLD1,  and large rearrangement analysis was performed for select regions of GREM1. The report date is November 19, 2017.   HRD tumor results indicate a BRCA1 mutation identified in the ovarian tumor causing genomic instability. The report date is 12/04/2017         Genetic Testing     Patient has genetic testing done for ER/PR and Her2/neu. Results revealed patient has the following: ER 15-20% PR 5-10% Her2/neu - negative       Genetic Testing     Patient has genetic testing done for MSI/MMR. Results revealed patient has the following mutation(s): MMR normal, MSI stable     12/10/2017 Imaging     1. Interval resection of large bilateral adnexal cystic masses. Residual soft tissue/tumor within bilateral adnexal regions as above. 2. Interval resolution of previously noted omental cake which may reflect interval omentectomy. There is a new peritoneal deposit identified within the left upper quadrant of the abdomen involving the anterior surface of the spleen.     12/10/2017 Tumor Marker     Patient's tumor was tested for the following markers: CA-125 Results of the tumor marker test revealed 94.9     12/11/2017 - 02/20/2018 Chemotherapy     The patient  had FOLFOX regimen for mucinous     01/09/2018 Tumor Marker     Patient's tumor was tested for the following markers: CA-125 Results of the tumor marker test revealed 37     01/31/2018 Imaging     Resolution of peritoneal soft tissue density along the anterior margin of the spleen since prior study. No evidence of residual or progressive metastatic disease.  Mild sigmoid diverticulosis, without radiographic evidence of diverticulitis. Resolution of small pericolonic abscess along the left lateral pelvic sidewall.  Mild hepatic steatosis.     02/06/2018 Tumor Marker     Patient's tumor was tested for the following markers: CA-125 Results of the tumor marker test revealed 36.7     03/06/2018 -  Chemotherapy     The patient is started on [name]  as PARP maintenance     03/13/2018 Tumor Marker     Patient's tumor was tested for the following markers: CA-125 Results of the tumor marker test revealed 33.8     04/05/2018 Tumor Marker     Patient's tumor was tested for the following markers: CA-125 Results of the tumor marker test revealed 27.3     05/03/2018 Tumor Marker     Patient's tumor was tested for the following markers: CA-125 Results of the tumor marker test revealed 23.5     07/08/2018 Tumor Marker     Patient's tumor was tested for the following markers: CA-125 Results of the tumor marker test revealed 19.9     09/02/2018 Tumor Marker     Patient's tumor was tested for the following markers: CA-125 Results of the tumor marker test revealed 17     10/28/2018 Imaging     1. 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 Repeat abdominal MRI with and without IV gadolinium with MRCP is recommended in 2 years to ensure continued stability. This recommendation follows ACR consensus guidelines: Management of Incidental Pancreatic Cysts: A White Paper of the ACR Incidental Findings Committee. Archer [id number]. 2. Hepatic steatosis. 3. Additional incidental findings, as above.       10/28/2018 Tumor Marker     Patient's tumor was tested for the following markers: CA-125 Results of the tumor marker test revealed 16.9     03/03/2019 Tumor Marker     Patient's tumor was tested for the following markers: CA-125 Results of the tumor marker test revealed 12.7.     07/09/2019 Tumor Marker     Patient's tumor was tested for the following markers: CA-125 Results of the tumor marker test revealed 9.5.     09/02/2019 Tumor Marker     Patient's tumor was tested for the following markers: CA-125 Results of the tumor marker test revealed 11.     10/28/2019 Tumor Marker     Patient's tumor was  tested for the following markers: CA-125 Results of the tumor marker test revealed 10.2     12/23/2019 Tumor Marker     Patient's tumor was tested for the following markers: CA-125 Results of the tumor marker test revealed 12.1     02/17/2020 Tumor Marker     Patient's tumor was tested for the following markers: CA-125 Results of the tumor marker test revealed 9.5     05/25/2020 Tumor Marker     Patient's tumor was tested for the following markers: CA-125 Results of the tumor marker test revealed 9.6  07/13/2020 Tumor Marker     Patient's tumor was tested for the following markers: CA-125 Results of the tumor marker test revealed 10.     11/01/2020 Tumor Marker     Patient's tumor was tested for the following markers: CA-125. Results of the tumor marker test revealed 18.1.     11/01/2020 Imaging     1. Cystic pancreatic lesions, potentially small intraductal papillary mucinous neoplasm. Potential slight increase in size of 1 of these areas in particular near the tail of the pancreas with perhaps mild increase in ventral duct dilation but no main pancreatic duct dilation. Given patient history consider six-month follow-up to assess for stability. 7 mm as the largest area with potential increase in size from 3 mm. 2. Hepatic steatosis. 3. Postoperative changes in the midline of the abdomen.     01/25/2021 Tumor Marker     Patient's tumor was tested for the following markers: CA-125. Results of the tumor marker test revealed 220.     02/03/2021 Imaging     Increased peritoneal soft tissue thickening and nodularity within the lower abdomen and pelvis, with minimal ascites. This is consistent with progressive peritoneal carcinomatosis.   New small ventral abdominal wall hernias.   Colonic diverticulosis. No radiographic evidence of diverticulitis.   Aortic Atherosclerosis (ICD10-I70.0).     02/14/2021 - 04/11/2021 Chemotherapy     Patient is on Treatment Plan : Ovarian FOLFOX +  Bevacizumab q14d        02/14/2021 Tumor Marker     Patient's tumor was tested for the following markers: CA-125. Results of the tumor marker test revealed 524.     03/14/2021 Tumor Marker     Patient's tumor was tested for the following markers: CA-125. Results of the tumor marker test revealed 356.     05/03/2021 Tumor Marker     Patient's tumor was tested for the following markers: CA-125. Results of the tumor marker test revealed 404.     05/05/2021 Imaging     1. Mild response to therapy of peritoneal and mesenteric disease as detailed above. 2. No new or progressive sites of disease identified. 3. Moderate basilar predominant subpleural reticulation with architectural distortion and traction bronchiolectasis. Favor developing drug toxicity. Correlate with pulmonary symptoms. 4. Coronary artery atherosclerosis. Aortic Atherosclerosis (ICD10-I70.0). 5.  Possible constipation.       History obtained from review of EMR, discussion with Ms. Dandy.  I reviewed available labs, medications, imaging, studies and related documents from the EMR.  Records reviewed and summarized above.   ROS 10 point system reviewed with Ms. 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/03/2019   BRCA gene mutation test positive 03/03/2019   Anemia due to antineoplastic chemotherapy 05/03/2018   Pancytopenia, acquired ([place]) 03/27/2018   Thrombocytopenia ([place]) 02/06/2018   Goals of care, counseling/discussion 12/11/2017   Family history of colon cancer    Family history of skin cancer    Nausea with vomiting 10/17/2017   Right ovarian epithelial cancer ([place]) 08/22/2017   Mediastinal adenopathy 08/03/2017   Type 2 diabetes mellitus treated  without insulin ([place]) 06/03/2014   Essential hypertension 06/03/2014   PAST MEDICAL HISTORY:  Active Ambulatory Problems    Diagnosis Date Noted   Type  2 diabetes mellitus treated without insulin ([place]) 06/03/2014   Essential hypertension 06/03/2014   Mediastinal adenopathy 08/03/2017   Right ovarian epithelial cancer ([place]) 08/22/2017   Nausea with vomiting 10/17/2017   Family history of colon cancer    Family history of skin cancer    Goals of care, counseling/discussion 12/11/2017   Thrombocytopenia ([place]) 02/06/2018   Pancytopenia, acquired ([place]) 03/27/2018   Anemia due to antineoplastic chemotherapy 05/03/2018   Hepatic steatosis 03/03/2019   Pancreatic cyst 03/03/2019   BRCA gene mutation test positive 03/03/2019   Deficiency anemia 11/02/2020   Carpal tunnel syndrome on right 04/11/2021   Allergic reaction caused by a drug 04/11/2021   Resolved Ambulatory Problems    Diagnosis Date Noted   Chronic nausea 08/24/2017   Cancer associated pain 08/24/2017   Left leg swelling 08/31/2017   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/17/2017   Past Medical History:  Diagnosis Date   Diabetes mellitus without complication ([place]) 1751   Diverticulosis    Family history of cancer    Hypertension    Osteopenia    Ovarian cancer ([place])    SOCIAL HX:  Social History   Tobacco Use   Smoking status: Never   Smokeless tobacco: Never  Substance Use Topics   Alcohol use: Yes    Alcohol/week: 5.0 standard drinks    Types: 5 Standard drinks or equivalent per week    Comment: WINE occ, NONE SICNE DX IN 07-2017   FAMILY HX:  Family History  Problem Relation Age of Onset   Colon cancer Mother 67       colon ca   Skin cancer Father 5       unsure type   Heart attack Father 40       d. 70   Stroke Maternal Grandmother    Brain cancer Maternal Grandfather 63   Dementia Maternal Aunt    Diabetes Maternal Aunt    Skin cancer Maternal Uncle    Heart attack Paternal Aunt     Fibromyalgia Sister    Diabetes Sister    Cancer Other 101       possible ovarian cancer, d. 23   Prostate cancer Cousin        pat first cousin      ALLERGIES:  Allergies  Allergen Reactions   Oxaliplatin Other (See Comments)    rigors     PERTINENT MEDICATIONS:  Outpatient Encounter Medications as of 05/24/2021  Medication Sig   acetaminophen (TYLENOL) 500 MG tablet Take 500 mg by mouth every 8 (eight) hours as needed for mild pain or moderate pain.   atenolol (TENORMIN) 25 MG tablet Take 25 mg by mouth at bedtime.    atorvastatin (LIPITOR) 20 MG tablet Take 20 mg by mouth daily.   CALCIUM-MAGNESIUM-VITAMIN D PO Take 1 tablet by mouth daily.   loratadine (CLARITIN) 10 MG tablet Take 10 mg by mouth daily.   losartan (COZAAR) 50 MG tablet Take 50 mg by mouth at bedtime.   metFORMIN (GLUCOPHAGE) 1000 MG tablet Take 500 mg by mouth 2 (two) times daily.    Multiple Vitamin (MULTIVITAMIN WITH MINERALS) TABS tablet Take 1 tablet by mouth daily.   [DISCONTINUED] prochlorperazine (COMPAZINE) 10 MG tablet Take 1 tablet (10 mg total) by mouth every 6 (six) hours as needed (Nausea or vomiting).   No facility-administered encounter medications on file as of 05/24/2021.   Thank you for the opportunity to participate in the care of Ms. Corsi.  The palliative  care team will continue to follow. Please call our office at [phone] if we can be of additional assistance.   This chart was dictated using voice recognition software.  Despite best efforts to proofread,  errors can occur which can change the documentation meaning.   Questions and concerns were addressed. The patient/family was encouraged to call with questions and/or concerns. My contact information was provided. Provided general support and encouragement, no other unmet needs identified   Kamarah Bilotta Ihor Gully, NP ,

## 2021-05-31 ENCOUNTER — Encounter: Payer: Self-pay | Admitting: Hematology and Oncology

## 2021-05-31 ENCOUNTER — Inpatient Hospital Stay: Payer: Medicare HMO | Attending: Hematology and Oncology | Admitting: Hematology and Oncology

## 2021-05-31 ENCOUNTER — Other Ambulatory Visit: Payer: Self-pay

## 2021-05-31 DIAGNOSIS — C561 Malignant neoplasm of right ovary: Secondary | ICD-10-CM | POA: Diagnosis not present

## 2021-05-31 DIAGNOSIS — I10 Essential (primary) hypertension: Secondary | ICD-10-CM | POA: Insufficient documentation

## 2021-05-31 DIAGNOSIS — Z7189 Other specified counseling: Secondary | ICD-10-CM | POA: Diagnosis not present

## 2021-05-31 DIAGNOSIS — Z8543 Personal history of malignant neoplasm of ovary: Secondary | ICD-10-CM | POA: Insufficient documentation

## 2021-05-31 NOTE — Assessment & Plan Note (Signed)
Her blood pressure is normal now but I am concerned that she might start losing weight in the future We discussed medication adjustment in the future if she start losing weight that we might discontinue her antihypertensives

## 2021-05-31 NOTE — Assessment & Plan Note (Signed)
We had extensive discussions about goals of care in the past She is now interested to pursue further treatment We reviewed the role of palliative care and hospice We discussed CODE STATUS and she does not want to be resuscitated She has advanced directives and living will

## 2021-05-31 NOTE — Assessment & Plan Note (Signed)
We discussed plan of care today She has made informed decision not to pursue further palliative chemotherapy She actually felt better time away from recent treatment and is enjoying great quality of life I reviewed signs and symptoms to watch out for cancer recurrence In the meantime, we will continue to stay in contact once a month for further supportive care She is 100% fully functional and is not a candidate for hospice yet I told the patient she does not need port maintenance

## 2021-05-31 NOTE — Progress Notes (Signed)
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[facility]) We discussed plan of care today She has made informed decision not to pursue further palliative chemotherapy She actually felt better time away from recent treatment and is enjoying great quality of life I reviewed signs and symptoms to watch out for cancer recurrence In the meantime, we will continue to stay in contact once a month for further supportive care She is 100% fully functional and is not a candidate for hospice yet I told the patient she does not need port maintenance  Essential hypertension Her blood pressure is normal now but I am concerned that she might start losing weight in the future We discussed medication adjustment in the future if she start losing weight that we might discontinue her antihypertensives  Goals of care, counseling/discussion We had extensive discussions about goals of care in the past She is now interested to pursue further treatment We reviewed the role of palliative care and hospice We discussed CODE STATUS and she does not want to be resuscitated She has advanced directives and living will  No orders of the defined types were placed in this encounter.   All questions were answered. The patient knows to call the clinic with any problems, questions or concerns. The total time spent in the appointment was 30 minutes encounter with patients including review of chart and various tests results, discussions about plan of care and coordination of care plan   [name], MD 05/31/2021 12:36 PM  INTERVAL HISTORY: Please see below for problem oriented charting. she returns for follow-up appointment to discuss goals of care She is here accompanied by her husband She is doing well and just came back from a trip to [place] She feels stronger Denies nausea, bloating, changes in  appetite or bowel habits She has made informed decision not to pursue further treatment  REVIEW OF SYSTEMS:   Constitutional: Denies fevers, chills or abnormal weight loss Eyes: Denies blurriness of vision Ears, nose, mouth, throat, and face: Denies mucositis or sore throat Respiratory: Denies cough, dyspnea or wheezes Cardiovascular: Denies palpitation, chest discomfort or lower extremity swelling Gastrointestinal:  Denies nausea, heartburn or change in bowel habits Skin: Denies abnormal skin rashes Lymphatics: Denies new lymphadenopathy or easy bruising Neurological:Denies numbness, tingling or new weaknesses Behavioral/Psych: Mood is stable, no new changes  All other systems were reviewed with the patient and are negative.  I have reviewed the past medical history, past surgical history, social history and family history with the patient and they are unchanged from previous note.  ALLERGIES:  is allergic to oxaliplatin.  MEDICATIONS:  Current Outpatient Medications  Medication Sig Dispense Refill   acetaminophen (TYLENOL) 500 MG tablet Take 500 mg by mouth every 8 (eight) hours as needed for mild pain or moderate pain.     atenolol (TENORMIN) 25 MG tablet Take 25 mg by mouth at bedtime.      atorvastatin (LIPITOR) 20 MG tablet Take 20 mg by mouth daily.     CALCIUM-MAGNESIUM-VITAMIN D PO Take 1 tablet by mouth daily.     loratadine (CLARITIN) 10 MG tablet Take 10 mg by mouth daily.     losartan (COZAAR) 50 MG tablet Take 50 mg by mouth at bedtime.     metFORMIN (GLUCOPHAGE) 1000 MG tablet Take 500 mg by mouth 2 (two) times daily.   0   Multiple Vitamin (  MULTIVITAMIN WITH MINERALS) TABS tablet Take 1 tablet by mouth daily.     No current facility-administered medications for this visit.    SUMMARY OF ONCOLOGIC HISTORY: Oncology History Overview Note  ER 15-20%, PR 5-10%, Her2/neu negative MMR normal Negative genetic testing HRD pos BRCA1 positive MSI Stable   Right  ovarian epithelial cancer ([place])  07/30/2017 Imaging   US pelvis Ultrasound revealed a complex cystic mass in the right adnexa measuring 20 x 11 x 12 cm with multiple internal septations some of which are thick. The left adnexa measured 12.7 x 11.6 x 8.1 with low level echoes and soft tissue nodules    07/30/2017 Tumor Marker   Patient's tumor was tested for the following markers: CA-125 Results of the tumor marker test revealed 521.3   08/17/2017 Pathology Results   The malignant cells are positive for PAX-8, cytokeratin 7, estrogen receptor, and faintly positive for GATA-3. They are negative for p53, GCDFP, and cytokeratin 20. The finding are consistent with a gynecologic primary carcinoma. Additional studies can be performed upon clinician request.   08/17/2017 Procedure   Technically successful CT-guided left lower quadrant omental mass core biopsy.   08/20/2017 PET scan   1. Cystic masses arising from the pelvis. The nodular component of the RIGHT cystic mass is intensely hypermetabolic consistent with malignant ovarian neoplasm. 2. Extensive hypermetabolic peritoneal thickening in the lower abdomen and upper pelvis, upper abdomen, and upper abdominal precordial fat and paradiaphragmatic fat. 3. Retroperitoneal nodal metastasis adjacent to the IVC at the level the kidneys. 4. No evidence of metastatic disease in the thorax other small effusion on the LEFT and nodal metastasis in the fat superior to the diaphragm. 5. Mild metabolic activity associated the distal esophagus is favored benign esophagitis.   08/20/2017 Imaging   CT chest 1. Bilateral cardiophrenic angle nodal metastasis. No additional findings to suggest metastatic disease to the chest. 2. Small left pleural effusion. 3. Peritoneal carcinomatosis noted within the abdomen. 4. Hepatic steatosis.     08/24/2017 Cancer Staging   Staging form: Ovary, Fallopian Tube, and Primary Peritoneal Carcinoma, AJCC 8th Edition - Clinical:  Stage IV (cT3, cN1, cM1) - Signed by [name], MD on 08/24/2017    08/31/2017 Tumor Marker   Patient's tumor was tested for the following markers: CA-125 Results of the tumor marker test revealed 819.9   09/26/2017 Adverse Reaction   She developed reaction to Taxol, managed successfully with additional premedications   10/17/2017 Tumor Marker   Patient's tumor was tested for the following markers: CA-125 Results of the tumor marker test revealed 374.4   10/31/2017 Imaging   1. No significant change in size of the large complex bilateral adnexal masses consistent with the known history of ovarian cancer. There is increased dependent density within the left-sided lesion. 2. Stable peritoneal carcinomatosis.  No significant ascites.  3. The bilateral pericardiac adenopathy has improved. No progressive thoracic metastatic disease. No pulmonary or osseous metastatic disease. 4. Suspicion of nonocclusive thrombus in the right internal jugular vein related to the right IJ port. Recommend further evaluation with Doppler ultrasound.   11/13/2017 Surgery   Preoperative Diagnosis: Ovarian cancer s/p neoadjuvant chemotherapy    Procedure(s) Performed:   Exploratory laparotomy  Bilateral salpingo-oophorectomy with radical tumor debulking for ovarian cancer  including retroperitoneal dissection, lysis of adhesions (enterolysis of small bowel in deep pelvis, left adnexal adhesions to sigmoid and omentum to LLQ) ~1 hour, ureterolysis. Omentectomy  Takedown of splenic flexure with removal of omental tumor in left  upper quadrant    Specimens: Bilateral tubes and ovaries, omentum, splenic flexure nodule, right paracolic gutter nodule, cecal nodule, right ureteral peritoneal nodule, small bowel mesentary.    Indication for Procedure:  Patient is s/p 3 cycles of chemotherapy with response based on CA125 and decreased mediastinal disease (to <1cm).   Operative Findings:  This represented an optimal  cytoreduction with gross residual disease remaining in the deep pelvis near the levator floor and possibly in the region of the right IP ligament/periappendicial region.   Upon entry a large ~20cm right tube/ovary, mostly cystic. Adherent rind along ileocecal region and appendix. Large cystic left tube/ovary ~10cm with sigmoid colon stretched across the mass and extension of the cystic lesion into the deep pelvis with residual palpable disease deep pelvis near levators. Estimate ~1cm or less on palpation, not visible disease. Omental caking in the LLQ adherent to pelvic sidewall. Omental disease noted RUQ separate. In addition ~1.5cm lesion in splenic flexure omentum. Evidence of prior diaphragmatic disease, no visible disease.       11/13/2017 Pathology Results   1. 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 - TUMOR INVOLVES THE OVARIAN SURFACE. - RIGHT FALLOPIAN TUBE IS INVOLVED. - SEE ONCOLOGY TABLE. - SEE NOTE. 2. Soft tissue mass, biopsy, cecal gutter nodule - METASTATIC MUCINOUS ADENOCARCINOMA. 3. Ovary and fallopian tube, left, left ovary and fallopian tube - METASTATIC MUCINOUS ADENOCARCINOMA TO LEFT OVARY AND FALLOPIAN TUBE. 4. Omentum, resection for tumor - METASTATIC MUCINOUS ADENOCARCINOMA TO OMENTUM. 5. Soft tissue mass, biopsy, splenic flexure nodule - METASTATIC MUCINOUS ADENOCARCINOMA. 6. Soft tissue mass, biopsy, cecal implant - METASTATIC MUCINOUS ADENOCARCINOMA. 7. Soft tissue mass, biopsy, right ureteral peritoneal implant - METASTATIC MUCINOUS ADENOCARCINOMA. 8. Mesentery, small bowel nodule - METASTATIC MUCINOUS ADENOCARCINOMA. Microscopic Comment 1. OVARY or FALLOPIAN TUBE or PRIMARY PERITONEUM: Procedure: Bilateral salpingo-oophorectomy. Specimen Integrity: Intact. Tumor Site: Right ovary. Ovarian Surface Involvement: Present. Fallopian Tube Surface Involvement: Present. Tumor Size: 20 cm. Histologic Type:  Mucinous adenocarcinoma. Histologic Grade: Overall G2 (moderately differentiated) (focal areas of poor differentiation are present). Implants: Present. Other Tissue/ Organ Involvement: Left ovary, left fallopian tube and omentum. Largest Extrapelvic Peritoneal Focus: less than 2 cm. See note Peritoneal/Ascitic Fluid: N/A Treatment Effect: No definite or minimal response identified (chemotherapy response score 1 [CRS 1]) Regional Lymph Nodes No lymph nodes submitted or found Number of Lymph Nodes Examined: 0 Pathologic Stage Classification (pTNM, AJCC 8th Edition): ypT3b, ypNX. Representative Tumor Block: 1D and 1E. (NDK:gt, 11/15/17)   11/19/2017 Genetic Testing   Negative genetic testing on the [employer] Myrisk panel.  The [facility] gene panel offered by [employer] includes sequencing and deletion/duplication testing of the following 35 genes: APC, ATM, AXIN2, BARD1, BMPR1A, BRCA1, BRCA2, BRIP1, CHD1, CDK4, CDKN2A, CHEK2, EPCAM (large rearrangement only), HOXB13, (sequencing only), GALNT12, MLH1, MSH2, MSH3 (excluding repetitive portions of exon 1), MSH6, MUTYH, NBN, NTHL1, PALB2, PMS2, PTEN, RAD51C, RAD51D, RNF43, RPS20, SMAD4, STK11, and TP53. Sequencing was performed for select regions of POLE and POLD1, and large rearrangement analysis was performed for select regions of GREM1. The report date is November 19, 2017.  HRD tumor results indicate a BRCA1 mutation identified in the ovarian tumor causing genomic instability. The report date is 12/04/2017     Genetic Testing   Patient has genetic testing done for ER/PR and Her2/neu. Results revealed patient has the following: ER 15-20% PR 5-10% Her2/neu - negative    Genetic Testing   Patient has genetic  testing done for MSI/MMR. Results revealed patient has the following mutation(s): MMR normal, MSI stable   12/10/2017 Imaging   1. Interval resection of large bilateral adnexal cystic masses. Residual soft tissue/tumor within  bilateral adnexal regions as above. 2. Interval resolution of previously noted omental cake which may reflect interval omentectomy. There is a new peritoneal deposit identified within the left upper quadrant of the abdomen involving the anterior surface of the spleen.   12/10/2017 Tumor Marker   Patient's tumor was tested for the following markers: CA-125 Results of the tumor marker test revealed 94.9   12/11/2017 - 02/20/2018 Chemotherapy   The patient had FOLFOX regimen for mucinous   01/09/2018 Tumor Marker   Patient's tumor was tested for the following markers: CA-125 Results of the tumor marker test revealed 37   01/31/2018 Imaging   Resolution of peritoneal soft tissue density along the anterior margin of the spleen since prior study. No evidence of residual or progressive metastatic disease.  Mild sigmoid diverticulosis, without radiographic evidence of diverticulitis. Resolution of small pericolonic abscess along the left lateral pelvic sidewall.  Mild hepatic steatosis.   02/06/2018 Tumor Marker   Patient's tumor was tested for the following markers: CA-125 Results of the tumor marker test revealed 36.7   03/06/2018 -  Chemotherapy   The patient is started on [name] as PARP maintenance   03/13/2018 Tumor Marker   Patient's tumor was tested for the following markers: CA-125 Results of the tumor marker test revealed 33.8   04/05/2018 Tumor Marker   Patient's tumor was tested for the following markers: CA-125 Results of the tumor marker test revealed 27.3   05/03/2018 Tumor Marker   Patient's tumor was tested for the following markers: CA-125 Results of the tumor marker test revealed 23.5   07/08/2018 Tumor Marker   Patient's tumor was tested for the following markers: CA-125 Results of the tumor marker test revealed 19.9   09/02/2018 Tumor Marker   Patient's tumor was tested for the following markers: CA-125 Results of the tumor marker test revealed 17   10/28/2018  Imaging   1. 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 Repeat abdominal MRI with and without IV gadolinium with MRCP is recommended in 2 years to ensure continued stability. This recommendation follows ACR consensus guidelines: Management of Incidental Pancreatic Cysts: A White Paper of the ACR Incidental Findings Committee. [place] [id number]. 2. Hepatic steatosis. 3. Additional incidental findings, as above.     10/28/2018 Tumor Marker   Patient's tumor was tested for the following markers: CA-125 Results of the tumor marker test revealed 16.9   03/03/2019 Tumor Marker   Patient's tumor was tested for the following markers: CA-125 Results of the tumor marker test revealed 12.7.   07/09/2019 Tumor Marker   Patient's tumor was tested for the following markers: CA-125 Results of the tumor marker test revealed 9.5.   09/02/2019 Tumor Marker   Patient's tumor was tested for the following markers: CA-125 Results of the tumor marker test revealed 11.   10/28/2019 Tumor Marker   Patient's tumor was tested for the following markers: CA-125 Results of the tumor marker test revealed 10.2   12/23/2019 Tumor Marker   Patient's tumor was tested for the following markers: CA-125 Results of the tumor marker test revealed 12.1   02/17/2020 Tumor Marker   Patient's tumor was tested for the following  markers: CA-125 Results of the tumor marker test revealed 9.5   05/25/2020 Tumor Marker   Patient's tumor was tested for the following markers: CA-125 Results of the tumor marker test revealed 9.6   07/13/2020 Tumor Marker   Patient's tumor was tested for the following markers: CA-125 Results of the tumor marker test revealed 10.   11/01/2020 Tumor Marker   Patient's tumor was tested for the following markers: CA-125. Results of the tumor marker test  revealed 18.1.   11/01/2020 Imaging   1. Cystic pancreatic lesions, potentially small intraductal papillary mucinous neoplasm. Potential slight increase in size of 1 of these areas in particular near the tail of the pancreas with perhaps mild increase in ventral duct dilation but no main pancreatic duct dilation. Given patient history consider six-month follow-up to assess for stability. 7 mm as the largest area with potential increase in size from 3 mm. 2. Hepatic steatosis. 3. Postoperative changes in the midline of the abdomen.   01/25/2021 Tumor Marker   Patient's tumor was tested for the following markers: CA-125. Results of the tumor marker test revealed 220.   02/03/2021 Imaging   Increased peritoneal soft tissue thickening and nodularity within the lower abdomen and pelvis, with minimal ascites. This is consistent with progressive peritoneal carcinomatosis.   New small ventral abdominal wall hernias.   Colonic diverticulosis. No radiographic evidence of diverticulitis.   Aortic Atherosclerosis (ICD10-I70.0).   02/14/2021 - 04/11/2021 Chemotherapy   Patient is on Treatment Plan : Ovarian FOLFOX + Bevacizumab q14d      02/14/2021 Tumor Marker   Patient's tumor was tested for the following markers: CA-125. Results of the tumor marker test revealed 524.   03/14/2021 Tumor Marker   Patient's tumor was tested for the following markers: CA-125. Results of the tumor marker test revealed 356.   05/03/2021 Tumor Marker   Patient's tumor was tested for the following markers: CA-125. Results of the tumor marker test revealed 404.   05/05/2021 Imaging   1. Mild response to therapy of peritoneal and mesenteric disease as detailed above. 2. No new or progressive sites of disease identified. 3. Moderate basilar predominant subpleural reticulation with architectural distortion and traction bronchiolectasis. Favor developing drug toxicity. Correlate with pulmonary symptoms. 4. Coronary  artery atherosclerosis. Aortic Atherosclerosis (ICD10-I70.0). 5.  Possible constipation.       PHYSICAL EXAMINATION: ECOG PERFORMANCE STATUS: 0 - Asymptomatic  Vitals:   05/31/21 1024  BP: (!) 122/58  Pulse: 77  Resp: 18  Temp: 97.8 F (36.6 C)  SpO2: 99%   Filed Weights   05/31/21 1024  Weight: 144 lb 6.4 oz (65.5 kg)    GENERAL:alert, no distress and comfortable  NEURO: alert & oriented x 3 with fluent speech, no focal motor/sensory deficits  LABORATORY DATA:  I have reviewed the data as listed    Component Value Date/Time   NA 138 05/03/2021 1205   K 4.1 05/03/2021 1205   CL 104 05/03/2021 1205   CO2 28 05/03/2021 1205   GLUCOSE 137 (H) 05/03/2021 1205   BUN 13 05/03/2021 1205   CREATININE 0.91 05/03/2021 1205   CREATININE 0.86 06/05/2014 0957   CALCIUM 9.1 05/03/2021 1205   PROT 8.0 05/03/2021 1205   ALBUMIN 3.5 05/03/2021 1205   AST 30 05/03/2021 1205   ALT 18 05/03/2021 1205   ALKPHOS 71 05/03/2021 1205   BILITOT 0.4 05/03/2021 1205   GFRNONAA >60 05/03/2021 1205   GFRAA >60 12/23/2019 0954   GFRAA >60 04/29/2019 0915  No results found for: SPEP, UPEP  Lab Results  Component Value Date   WBC 7.1 05/03/2021   NEUTROABS 2.5 05/03/2021   HGB 10.6 (L) 05/03/2021   HCT 33.8 (L) 05/03/2021   MCV 100.9 (H) 05/03/2021   PLT 149 (L) 05/03/2021      Chemistry      Component Value Date/Time   NA 138 05/03/2021 1205   K 4.1 05/03/2021 1205   CL 104 05/03/2021 1205   CO2 28 05/03/2021 1205   BUN 13 05/03/2021 1205   CREATININE 0.91 05/03/2021 1205   CREATININE 0.86 06/05/2014 0957      Component Value Date/Time   CALCIUM 9.1 05/03/2021 1205   ALKPHOS 71 05/03/2021 1205   AST 30 05/03/2021 1205   ALT 18 05/03/2021 1205   BILITOT 0.4 05/03/2021 1205

## 2021-06-16 ENCOUNTER — Telehealth: Payer: Self-pay

## 2021-06-16 ENCOUNTER — Telehealth: Payer: Self-pay | Admitting: Nurse Practitioner

## 2021-06-16 NOTE — Telephone Encounter (Signed)
Return call to [employer].  Consents and palliative care explained.  Patient gave verbal consent in January when initial appt was made. Patient verbalized understanding and wishes to make follow up appt due to decline. Follow up in person scheduled for 3/2 @ 9 am

## 2021-06-16 NOTE — Telephone Encounter (Signed)
I returned Ms Gaertner call, no answer, message left with contact information

## 2021-06-17 ENCOUNTER — Telehealth: Payer: Self-pay | Admitting: Nurse Practitioner

## 2021-06-17 NOTE — Telephone Encounter (Signed)
I called [name] for update on symptoms, discussed option of Hospice benefit under Medicare program. [name] in agreement to proceed with Hospice as Hospice Physicians in agreement; Called Dr Willey Blade, order received for Hospice; notified Hospice of order

## 2021-06-23 ENCOUNTER — Other Ambulatory Visit: Payer: Self-pay | Admitting: Internal Medicine

## 2021-06-23 ENCOUNTER — Other Ambulatory Visit (HOSPITAL_COMMUNITY): Payer: Self-pay | Admitting: Internal Medicine

## 2021-06-23 ENCOUNTER — Other Ambulatory Visit: Payer: Medicare HMO | Admitting: Nurse Practitioner

## 2021-06-23 ENCOUNTER — Other Ambulatory Visit (HOSPITAL_COMMUNITY): Payer: Self-pay | Admitting: Specialist

## 2021-06-23 DIAGNOSIS — R180 Malignant ascites: Secondary | ICD-10-CM

## 2021-06-23 DIAGNOSIS — R1312 Dysphagia, oropharyngeal phase: Secondary | ICD-10-CM

## 2021-06-28 ENCOUNTER — Other Ambulatory Visit: Payer: Self-pay

## 2021-06-28 ENCOUNTER — Inpatient Hospital Stay: Attending: Hematology and Oncology | Admitting: Hematology and Oncology

## 2021-06-28 DIAGNOSIS — C561 Malignant neoplasm of right ovary: Secondary | ICD-10-CM | POA: Diagnosis not present

## 2021-06-28 DIAGNOSIS — Z7189 Other specified counseling: Secondary | ICD-10-CM

## 2021-06-29 ENCOUNTER — Encounter: Payer: Self-pay | Admitting: Hematology and Oncology

## 2021-06-29 NOTE — Assessment & Plan Note (Signed)
We have numerous discussions about goals of care ?The patient has made informed decision with comfort measures only ?She agreed to DNR ?She is established with home-based palliative care and hospice ?We discussed future follow-up ?She is comfortable to follow-up with hospice care at home ?I will discontinue future follow-up but will be ready to see her back if needed ? ?

## 2021-06-29 NOTE — Assessment & Plan Note (Signed)
She has made informed decision not to pursue further palliative chemotherapy ?She has clinical recurrence of ascites, palliative care service has arranged for therapeutic paracentesis ?The patient is comfortable to follow-up with home-based palliative care with hospice alone ?

## 2021-06-29 NOTE — Progress Notes (Signed)
HEMATOLOGY-ONCOLOGY ELECTRONIC VISIT PROGRESS NOTE  Patient Care Team: [name], MD as PCP - General (Internal Medicine)  I connected with the patient via telephone conference and verified that I am speaking with the correct person using two identifiers. The patient's location is at home and I am providing care from the [facility] I discussed the limitations, risks, security and privacy concerns of performing an evaluation and management service by e-visits and the availability of in person appointments.  I also discussed with the patient that there may be a patient responsible charge related to this service. The patient expressed understanding and agreed to proceed.   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) She has made informed decision not to pursue further palliative chemotherapy She has clinical recurrence of ascites, palliative care service has arranged for therapeutic paracentesis The patient is comfortable to follow-up with home-based palliative care with hospice alone  Goals of care, counseling/discussion We have numerous discussions about goals of care The patient has made informed decision with comfort measures only She agreed to DNR She is established with home-based palliative care and hospice We discussed future follow-up She is comfortable to follow-up with hospice care at home I will discontinue future follow-up but will be ready to see her back if needed   No orders of the defined types were placed in this encounter.   INTERVAL HISTORY: Please see below for problem oriented charting. The purpose of today's discussion is reviewed plan of care and symptom management on patient with recurrent ovarian cancer, on palliative care only According to the patient, she have recurrent ascites She is now established with home-based palliative care with hospice services Her primary care doctor and palliative care team have arranged for therapeutic  paracentesis She denies significant abdominal pain She has no other concerns related to her future treatment plan of care  SUMMARY OF ONCOLOGIC HISTORY: Oncology History Overview Note  ER 15-20%, PR 5-10%, Her2/neu negative MMR normal Negative genetic testing HRD pos BRCA1 positive MSI Stable   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 The left adnexa measured 12.7 x 11.6 x 8.1 with low level echoes and soft tissue nodules    07/30/2017 Tumor Marker   Patient's tumor was tested for the following markers: CA-125 Results of the tumor marker test revealed 521.3   08/17/2017 Pathology Results   The malignant cells are positive for PAX-8, cytokeratin 7, estrogen receptor, and faintly positive for GATA-3. They are negative for p53, GCDFP, and cytokeratin 20. The finding are consistent with a gynecologic primary carcinoma. Additional studies can be performed upon clinician request.   08/17/2017 Procedure   Technically successful CT-guided left lower quadrant omental mass core biopsy.   08/20/2017 PET scan   1. Cystic masses arising from the pelvis. The nodular component of the RIGHT cystic mass is intensely hypermetabolic consistent with malignant ovarian neoplasm. 2. Extensive hypermetabolic peritoneal thickening in the lower abdomen and upper pelvis, upper abdomen, and upper abdominal precordial fat and paradiaphragmatic fat. 3. Retroperitoneal nodal metastasis adjacent to the IVC at the level the kidneys. 4. No evidence of metastatic disease in the thorax other small effusion on the LEFT and nodal metastasis in the fat superior to the diaphragm. 5. Mild metabolic activity associated the distal esophagus is favored benign esophagitis.   08/20/2017 Imaging  CT chest 1. Bilateral cardiophrenic angle nodal metastasis. No  additional findings to suggest metastatic disease to the chest. 2. Small left pleural effusion. 3. Peritoneal carcinomatosis noted within the abdomen. 4. Hepatic steatosis.     08/24/2017 Cancer Staging   Staging form: Ovary, Fallopian Tube, and Primary Peritoneal Carcinoma, AJCC 8th Edition - Clinical: Stage IV (cT3, cN1, cM1) - Signed by [name], MD on 08/24/2017    08/31/2017 Tumor Marker   Patient's tumor was tested for the following markers: CA-125 Results of the tumor marker test revealed 819.9   09/26/2017 Adverse Reaction   She developed reaction to Taxol, managed successfully with additional premedications   10/17/2017 Tumor Marker   Patient's tumor was tested for the following markers: CA-125 Results of the tumor marker test revealed 374.4   10/31/2017 Imaging   1. No significant change in size of the large complex bilateral adnexal masses consistent with the known history of ovarian cancer. There is increased dependent density within the left-sided lesion. 2. Stable peritoneal carcinomatosis.  No significant ascites.  3. The bilateral pericardiac adenopathy has improved. No progressive thoracic metastatic disease. No pulmonary or osseous metastatic disease. 4. Suspicion of nonocclusive thrombus in the right internal jugular vein related to the right IJ port. Recommend further evaluation with Doppler ultrasound.   11/13/2017 Surgery   Preoperative Diagnosis: Ovarian cancer s/p neoadjuvant chemotherapy    Procedure(s) Performed:   Exploratory laparotomy  Bilateral salpingo-oophorectomy with radical tumor debulking for ovarian cancer  including retroperitoneal dissection, lysis of adhesions (enterolysis of small bowel in deep pelvis, left adnexal adhesions to sigmoid and omentum to LLQ) ~1 hour, ureterolysis. Omentectomy  Takedown of splenic flexure with removal of omental tumor in left upper quadrant    Specimens: Bilateral tubes and ovaries, omentum, splenic flexure nodule,  right paracolic gutter nodule, cecal nodule, right ureteral peritoneal nodule, small bowel mesentary.    Indication for Procedure:  Patient is s/p 3 cycles of chemotherapy with response based on CA125 and decreased mediastinal disease (to <1cm).   Operative Findings:  This represented an optimal cytoreduction with gross residual disease remaining in the deep pelvis near the levator floor and possibly in the region of the right IP ligament/periappendicial region.   Upon entry a large ~20cm right tube/ovary, mostly cystic. Adherent rind along ileocecal region and appendix. Large cystic left tube/ovary ~10cm with sigmoid colon stretched across the mass and extension of the cystic lesion into the deep pelvis with residual palpable disease deep pelvis near levators. Estimate ~1cm or less on palpation, not visible disease. Omental caking in the LLQ adherent to pelvic sidewall. Omental disease noted RUQ separate. In addition ~1.5cm lesion in splenic flexure omentum. Evidence of prior diaphragmatic disease, no visible disease.       11/13/2017 Pathology Results   1. 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 - TUMOR INVOLVES THE OVARIAN SURFACE. - RIGHT FALLOPIAN TUBE IS INVOLVED. - SEE ONCOLOGY TABLE. - SEE NOTE. 2. Soft tissue mass, biopsy, cecal gutter nodule - METASTATIC MUCINOUS ADENOCARCINOMA. 3. Ovary and fallopian tube, left, left ovary and fallopian tube - METASTATIC MUCINOUS ADENOCARCINOMA TO LEFT OVARY AND FALLOPIAN TUBE. 4. Omentum, resection for tumor - METASTATIC MUCINOUS ADENOCARCINOMA TO OMENTUM. 5. Soft tissue mass, biopsy, splenic flexure nodule - METASTATIC MUCINOUS ADENOCARCINOMA. 6. Soft tissue mass, biopsy, cecal implant - METASTATIC MUCINOUS ADENOCARCINOMA. 7. Soft tissue mass, biopsy, right ureteral peritoneal implant - METASTATIC MUCINOUS ADENOCARCINOMA. 8. Mesentery, small bowel nodule - METASTATIC  MUCINOUS  ADENOCARCINOMA. Microscopic Comment 1. OVARY or FALLOPIAN TUBE or PRIMARY PERITONEUM: Procedure: Bilateral salpingo-oophorectomy. Specimen Integrity: Intact. Tumor Site: Right ovary. Ovarian Surface Involvement: Present. Fallopian Tube Surface Involvement: Present. Tumor Size: 20 cm. Histologic Type: Mucinous adenocarcinoma. Histologic Grade: Overall G2 (moderately differentiated) (focal areas of poor differentiation are present). Implants: Present. Other Tissue/ Organ Involvement: Left ovary, left fallopian tube and omentum. Largest Extrapelvic Peritoneal Focus: less than 2 cm. See note Peritoneal/Ascitic Fluid: N/A Treatment Effect: No definite or minimal response identified (chemotherapy response score 1 [CRS 1]) Regional Lymph Nodes No lymph nodes submitted or found Number of Lymph Nodes Examined: 0 Pathologic Stage Classification (pTNM, AJCC 8th Edition): ypT3b, ypNX. Representative Tumor Block: 1D and 1E. (NDK:gt, 11/15/17)   11/19/2017 Genetic Testing   Negative genetic testing on the [employer] Myrisk panel.  The [facility] gene panel offered by [employer] includes sequencing and deletion/duplication testing of the following 35 genes: APC, ATM, AXIN2, BARD1, BMPR1A, BRCA1, BRCA2, BRIP1, CHD1, CDK4, CDKN2A, CHEK2, EPCAM (large rearrangement only), HOXB13, (sequencing only), GALNT12, MLH1, MSH2, MSH3 (excluding repetitive portions of exon 1), MSH6, MUTYH, NBN, NTHL1, PALB2, PMS2, PTEN, RAD51C, RAD51D, RNF43, RPS20, SMAD4, STK11, and TP53. Sequencing was performed for select regions of POLE and POLD1, and large rearrangement analysis was performed for select regions of GREM1. The report date is November 19, 2017.  HRD tumor results indicate a BRCA1 mutation identified in the ovarian tumor causing genomic instability. The report date is 12/04/2017     Genetic Testing   Patient has genetic testing done for ER/PR and Her2/neu. Results revealed patient has the following: ER  15-20% PR 5-10% Her2/neu - negative    Genetic Testing   Patient has genetic testing done for MSI/MMR. Results revealed patient has the following mutation(s): MMR normal, MSI stable   12/10/2017 Imaging   1. Interval resection of large bilateral adnexal cystic masses. Residual soft tissue/tumor within bilateral adnexal regions as above. 2. Interval resolution of previously noted omental cake which may reflect interval omentectomy. There is a new peritoneal deposit identified within the left upper quadrant of the abdomen involving the anterior surface of the spleen.   12/10/2017 Tumor Marker   Patient's tumor was tested for the following markers: CA-125 Results of the tumor marker test revealed 94.9   12/11/2017 - 02/20/2018 Chemotherapy   The patient had FOLFOX regimen for mucinous   01/09/2018 Tumor Marker   Patient's tumor was tested for the following markers: CA-125 Results of the tumor marker test revealed 37   01/31/2018 Imaging   Resolution of peritoneal soft tissue density along the anterior margin of the spleen since prior study. No evidence of residual or progressive metastatic disease.  Mild sigmoid diverticulosis, without radiographic evidence of diverticulitis. Resolution of small pericolonic abscess along the left lateral pelvic sidewall.  Mild hepatic steatosis.   02/06/2018 Tumor Marker   Patient's tumor was tested for the following markers: CA-125 Results of the tumor marker test revealed 36.7   03/06/2018 -  Chemotherapy   The patient is started on [name] as PARP maintenance   03/13/2018 Tumor Marker   Patient's tumor was tested for the following markers: CA-125 Results of the tumor marker test revealed 33.8   04/05/2018 Tumor Marker   Patient's tumor was tested for the following markers: CA-125 Results of the tumor marker test revealed 27.3   05/03/2018 Tumor Marker   Patient's tumor was tested for the following markers: CA-125 Results of the tumor  marker test revealed 23.5  07/08/2018 Tumor Marker   Patient's tumor was tested for the following markers: CA-125 Results of the tumor marker test revealed 19.9   09/02/2018 Tumor Marker   Patient's tumor was tested for the following markers: CA-125 Results of the tumor marker test revealed 17   10/28/2018 Imaging   1. 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 Repeat abdominal MRI with and without IV gadolinium with MRCP is recommended in 2 years to ensure continued stability. This recommendation follows ACR consensus guidelines: Management of Incidental Pancreatic Cysts: A White Paper of the ACR Incidental Findings Committee. [place] 4401;02:[id number]. 2. Hepatic steatosis. 3. Additional incidental findings, as above.     10/28/2018 Tumor Marker   Patient's tumor was tested for the following markers: CA-125 Results of the tumor marker test revealed 16.9   03/03/2019 Tumor Marker   Patient's tumor was tested for the following markers: CA-125 Results of the tumor marker test revealed 12.7.   07/09/2019 Tumor Marker   Patient's tumor was tested for the following markers: CA-125 Results of the tumor marker test revealed 9.5.   09/02/2019 Tumor Marker   Patient's tumor was tested for the following markers: CA-125 Results of the tumor marker test revealed 11.   10/28/2019 Tumor Marker   Patient's tumor was tested for the following markers: CA-125 Results of the tumor marker test revealed 10.2   12/23/2019 Tumor Marker   Patient's tumor was tested for the following markers: CA-125 Results of the tumor marker test revealed 12.1   02/17/2020 Tumor Marker   Patient's tumor was tested for the following markers: CA-125 Results of the tumor marker test revealed 9.5   05/25/2020 Tumor Marker   Patient's tumor was tested for the following markers:  CA-125 Results of the tumor marker test revealed 9.6   07/13/2020 Tumor Marker   Patient's tumor was tested for the following markers: CA-125 Results of the tumor marker test revealed 10.   11/01/2020 Tumor Marker   Patient's tumor was tested for the following markers: CA-125. Results of the tumor marker test revealed 18.1.   11/01/2020 Imaging   1. Cystic pancreatic lesions, potentially small intraductal papillary mucinous neoplasm. Potential slight increase in size of 1 of these areas in particular near the tail of the pancreas with perhaps mild increase in ventral duct dilation but no main pancreatic duct dilation. Given patient history consider six-month follow-up to assess for stability. 7 mm as the largest area with potential increase in size from 3 mm. 2. Hepatic steatosis. 3. Postoperative changes in the midline of the abdomen.   01/25/2021 Tumor Marker   Patient's tumor was tested for the following markers: CA-125. Results of the tumor marker test revealed 220.   02/03/2021 Imaging   Increased peritoneal soft tissue thickening and nodularity within the lower abdomen and pelvis, with minimal ascites. This is consistent with progressive peritoneal carcinomatosis.   New small ventral abdominal wall hernias.   Colonic diverticulosis. No radiographic evidence of diverticulitis.   Aortic Atherosclerosis (ICD10-I70.0).   02/14/2021 - 04/11/2021 Chemotherapy   Patient is on Treatment Plan : Ovarian FOLFOX + Bevacizumab q14d      02/14/2021 Tumor Marker   Patient's tumor was tested for the following markers: CA-125. Results of the tumor marker test revealed 524.   03/14/2021 Tumor Marker   Patient's tumor was tested for the following markers: CA-125. Results of the tumor  marker test revealed 356.   05/03/2021 Tumor Marker   Patient's tumor was tested for the following markers: CA-125. Results of the tumor marker test revealed 404.   05/05/2021 Imaging   1. Mild response to  therapy of peritoneal and mesenteric disease as detailed above. 2. No new or progressive sites of disease identified. 3. Moderate basilar predominant subpleural reticulation with architectural distortion and traction bronchiolectasis. Favor developing drug toxicity. Correlate with pulmonary symptoms. 4. Coronary artery atherosclerosis. Aortic Atherosclerosis (ICD10-I70.0). 5.  Possible constipation.       REVIEW OF SYSTEMS:   Constitutional: Denies fevers, chills or abnormal weight loss Eyes: Denies blurriness of vision Ears, nose, mouth, throat, and face: Denies mucositis or sore throat Respiratory: Denies cough, dyspnea or wheezes Cardiovascular: Denies palpitation, chest discomfort Gastrointestinal:  Denies nausea, heartburn or change in bowel habits Skin: Denies abnormal skin rashes Lymphatics: Denies new lymphadenopathy or easy bruising Neurological:Denies numbness, tingling or new weaknesses Behavioral/Psych: Mood is stable, no new changes  Extremities: No lower extremity edema All other systems were reviewed with the patient and are negative.  I have reviewed the past medical history, past surgical history, social history and family history with the patient and they are unchanged from previous note.  ALLERGIES:  is allergic to oxaliplatin.  MEDICATIONS:  Current Outpatient Medications  Medication Sig Dispense Refill   acetaminophen (TYLENOL) 500 MG tablet Take 500 mg by mouth every 8 (eight) hours as needed for mild pain or moderate pain.     atenolol (TENORMIN) 25 MG tablet Take 25 mg by mouth at bedtime.      CALCIUM-MAGNESIUM-VITAMIN D PO Take 1 tablet by mouth daily.     loratadine (CLARITIN) 10 MG tablet Take 10 mg by mouth daily.     Multiple Vitamin (MULTIVITAMIN WITH MINERALS) TABS tablet Take 1 tablet by mouth daily.     No current facility-administered medications for this visit.    PHYSICAL EXAMINATION: ECOG PERFORMANCE STATUS: 2 - Symptomatic, <50% confined  to bed  LABORATORY DATA:  I have reviewed the data as listed CMP Latest Ref Rng & Units 05/03/2021 04/11/2021 03/29/2021  Glucose 70 - 99 mg/dL 137(H) 206(H) 151(H)  BUN 8 - 23 mg/dL 13 9 14   Creatinine 0.44 - 1.00 mg/dL 0.91 0.94 0.94  Sodium 135 - 145 mmol/L 138 139 135  Potassium 3.5 - 5.1 mmol/L 4.1 4.2 4.2  Chloride 98 - 111 mmol/L 104 106 102  CO2 22 - 32 mmol/L 28 25 22   Calcium 8.9 - 10.3 mg/dL 9.1 8.6(L) 9.0  Total Protein 6.5 - 8.1 g/dL 8.0 7.3 7.7  Total Bilirubin 0.3 - 1.2 mg/dL 0.4 0.4 0.6  Alkaline Phos 38 - 126 U/L 71 75 62  AST 15 - 41 U/L 30 22 21   ALT 0 - 44 U/L 18 16 16     Lab Results  Component Value Date   WBC 7.1 05/03/2021   HGB 10.6 (L) 05/03/2021   HCT 33.8 (L) 05/03/2021   MCV 100.9 (H) 05/03/2021   PLT 149 (L) 05/03/2021   NEUTROABS 2.5 05/03/2021     I discussed the assessment and treatment plan with the patient. The patient was provided an opportunity to ask questions and all were answered. The patient agreed with the plan and demonstrated an understanding of the instructions. The patient was advised to call back or seek an in-person evaluation if the symptoms worsen or if the condition fails to improve as anticipated.    I spent 20 minutes for the  appointment reviewing test results, discuss management and coordination of care.  [name], MD 06/29/2021 12:15 PM

## 2021-06-30 ENCOUNTER — Encounter (HOSPITAL_COMMUNITY): Payer: Self-pay

## 2021-06-30 ENCOUNTER — Ambulatory Visit (HOSPITAL_COMMUNITY)
Admission: RE | Admit: 2021-06-30 | Discharge: 2021-06-30 | Disposition: A | Discharge status: Home / Self Care | Source: Ambulatory Visit | Attending: Internal Medicine | Admitting: Internal Medicine

## 2021-06-30 ENCOUNTER — Other Ambulatory Visit: Payer: Self-pay

## 2021-06-30 DIAGNOSIS — R180 Malignant ascites: Secondary | ICD-10-CM | POA: Insufficient documentation

## 2021-06-30 DIAGNOSIS — C801 Malignant (primary) neoplasm, unspecified: Secondary | ICD-10-CM | POA: Diagnosis not present

## 2021-06-30 NOTE — Progress Notes (Signed)
PT tolerated right sided paracentesis procedure well and 4 Liters of clear yellow fluid removed. PT verbalized understanding of discharge instructions and left via wheelchair with husband with no acute distress noted.  ?

## 2021-06-30 NOTE — Procedures (Signed)
? ?  Hx Ovarian Cancer ?Ascites ? ?US guided RLQ paracentesis ?4 Liters yellow fluid ? ?No labs sent per MD ?Tolerated well ? ?EBL: None ?

## 2021-07-05 ENCOUNTER — Other Ambulatory Visit: Payer: Self-pay

## 2021-07-05 ENCOUNTER — Encounter (HOSPITAL_COMMUNITY): Payer: Self-pay | Admitting: Speech Pathology

## 2021-07-05 ENCOUNTER — Encounter: Payer: Self-pay | Admitting: Hematology and Oncology

## 2021-07-05 ENCOUNTER — Other Ambulatory Visit: Payer: Medicare HMO

## 2021-07-05 ENCOUNTER — Ambulatory Visit (HOSPITAL_COMMUNITY)
Admission: RE | Admit: 2021-07-05 | Discharge: 2021-07-05 | Disposition: A | Discharge status: Home / Self Care | Source: Ambulatory Visit | Attending: Internal Medicine | Admitting: Internal Medicine

## 2021-07-05 ENCOUNTER — Ambulatory Visit (HOSPITAL_COMMUNITY): Attending: Internal Medicine | Admitting: Speech Pathology

## 2021-07-05 DIAGNOSIS — Z515 Encounter for palliative care: Secondary | ICD-10-CM

## 2021-07-05 DIAGNOSIS — R1312 Dysphagia, oropharyngeal phase: Secondary | ICD-10-CM | POA: Insufficient documentation

## 2021-07-05 NOTE — Progress Notes (Signed)
PATIENT NAME: Sabrina Vang ?DOB: August 12, 1950 ?MRN: [id number] ? ?PRIMARY CARE PROVIDER: [name], MD ? ?RESPONSIBLE PARTY:  ?Acct ID - Guarantor Home Phone Work Phone Relationship Acct Type  ?[id number] - MAUS,Lessie T [phone]  Self P/F  ?   [address], [place], [place] [id number]  ? ? ? ?Due to the COVID-19 crisis, this visit was done via telemedicine from my office and it was initiated and consent by this patient and or family. ? ?I connected with  Sabrina Vang OR PROXY on 07/05/21 by telephone and verified that I am speaking with the correct person using two identifiers. ?  ?I discussed the limitations of evaluation and management by telemedicine. The patient expressed understanding and agreed to proceed.  ? ?HISTORY OF PRESENT ILLNESS:   [name] is a 71 y.o. year old female  with multiple medical problems including Right ovarian epithelial cancer, mediastinal adenopathy, HTN, hepatic steatosis, DM, right carpel tunnel, thrombocytopenia, pancytopenia, deficiency anemia.  Patient is being followed by Palliative Care every 4-8 weeks and PRN.  ? ?Connected with patient by telephone to follow up on overall condition as requested by Natalia Leatherwood, NP.   ? ?Anorexia:  Patient endorses a poor appetite.  Believes she has lost weight as her clothing is loose fitting.  She has not obtained any recent weights due to her poor balance.  Patient is scheduled for a swallow study today.  She endorses some coughing when taking in foods/liquids.  ? ?Debility:  Patient endorses increase in weakness.  Using a walker for ambulation.  Denies any falls. ? ?Insomnia:  Patient denies any issues with insomnia.  Taking tramadol at hs.  ? ?Pain:  Patient endorses no major pain.  Occasional "twitching" in her back.  Has tramadol to manage pain.  ? ?Hospice:  Patient advised that she is now under hospice care.   ? ?CODE STATUS: Full ?ADVANCED DIRECTIVES: Yes ?MOST FORM: No ?PPS: 50% ? ? ? ? ?Lorenza Burton, RN ? ?

## 2021-07-05 NOTE — Therapy (Signed)
?[facility] ?[address] ?[place], [place], [address] ?Phone: [phone]   Fax:  [phone] ? ?Modified Barium Swallow ? ?Patient Details  ?Name: Sabrina Vang ?MRN: [id number] ?Date of Birth: 10-Oct-1950 ?No data recorded ? ?Encounter Date: 07/05/2021 ? ? End of Session - 07/05/21 1448   ? ? Visit Number 1   ? Number of Visits 1   ? Authorization Type [employer]   ? SLP Start Time 1315   ? SLP Stop Time  1340   ? SLP Time Calculation (min) 25 min   ? Activity Tolerance Patient tolerated treatment well   ? ?  ?  ? ?  ? ? ?Past Medical History:  ?Diagnosis Date  ? Diabetes mellitus without complication ([place]) 5465  ? TYPE 2  ? Diverticulosis   ? Family history of cancer   ? Hypertension   ? Osteopenia   ? Ovarian cancer ([place])   ? Pancreatic cyst   ? ? ?Past Surgical History:  ?Procedure Laterality Date  ? CHOLECYSTECTOMY N/A 07/06/2014  ? Procedure: LAPAROSCOPIC CHOLECYSTECTOMY;  Surgeon: Aviva Signs Md, MD;  Location: [facility];  Service: General;  Laterality: N/A;  ? COLONOSCOPY N/A 06/26/2013  ? Procedure: COLONOSCOPY;  Surgeon: Rogene Houston, MD;  Location: [facility];  Service: Endoscopy;  Laterality: N/A;  1030  ? DEBULKING N/A 11/13/2017  ? Procedure: DEBULKING;  Surgeon: Isabel Caprice, MD;  Location: [facility];  Service: Gynecology;  Laterality: N/A;  ? IR FLUORO GUIDE PORT INSERTION RIGHT  09/03/2017  ? IR US GUIDE VASC ACCESS RIGHT  09/03/2017  ? LAPAROTOMY N/A 11/13/2017  ? Procedure: EXPLORATORY LAPAROTOMY;  Surgeon: Isabel Caprice, MD;  Location: [facility];  Service: Gynecology;  Laterality: N/A;  ? SALPINGOOPHORECTOMY Bilateral 11/13/2017  ? Procedure: BILATERAL SALPINGO OOPHORECTOMY;  Surgeon: Isabel Caprice, MD;  Location: [facility];  Service: Gynecology;  Laterality: Bilateral;  ? VAGINAL HYSTERECTOMY  2011  ? ? ?There were no vitals filed for this visit. ? ? Subjective Assessment - 07/05/21 1428   ? ? Subjective "Everything has a metallic taste."   ? Special  Tests MBSS   ? Currently in Pain? No/denies   ? ?  ?  ? ?  ? ? ? ? ? ? General - 07/05/21 1429   ? ?  ? General Information  ? Date of Onset 06/23/21   ? HPI Sabrina Vang is a 71 yo female who was referred by Dr. [name] for MBSS due to Pt with report of difficulty swallowing. 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 Pt tells SLP that she has had trouble swallowing for the past month and has not had much of an appetite. She was unable to expand on her difficulties, other than saying that she can drink liquids and soft foods fairly well.   ? Type of Study MBS-Modified Barium Swallow Study   ? Previous Swallow Assessment n/a   ? Diet Prior to this Study Regular;Thin liquids   ? Temperature Spikes Noted No   ? Respiratory Status Room air   ? History of Recent Intubation No   ? Behavior/Cognition Alert;Cooperative;Pleasant mood   ? Oral Cavity Assessment Within Functional Limits   ? Oral Care Completed by SLP No   ? Oral Cavity - Dentition Adequate natural dentition   ? Vision Functional for self feeding   ? Self-Feeding Abilities Able to feed self   ?  Patient Positioning Upright in chair   ? Baseline Vocal Quality Normal;Low vocal intensity   ? Volitional Cough Weak   ? Volitional Swallow Able to elicit   ? Anatomy Within functional limits   osteophytes near C6-7  ? Pharyngeal Secretions Not observed secondary MBS   ? ?  ?  ? ?  ? ? ? ? ? Oral Preparation/Oral Phase - 07/05/21 1430   ? ?  ? Oral Preparation/Oral Phase  ? Oral Phase Impaired   ?  ? Oral - Solids  ? Oral - Pill Absent A-P transit   Pt unable to move pill to posterior oral cavity with thins or puree and eventually expectorated.  ?  ? Electrical stimulation - Oral Phase  ? Was Electrical Stimulation Used No   ? ?  ?  ? ?  ? ? ? Pharyngeal Phase - 07/05/21 1432   ? ?  ? Pharyngeal Phase  ? Pharyngeal Phase Within functional limits   ?  ? Electrical Stimulation - Pharyngeal Phase  ? Was  Electrical Stimulation Used No   ? ?  ?  ? ?  ? ? ? Cricopharyngeal Phase - 07/05/21 1432   ? ?  ? Cervical Esophageal Phase  ? Cervical Esophageal Phase Impaired   ?  ? Cervical Esophageal Phase - Comment  ? Other Esophageal Phase Observations some gastroesophageal regugitation observed, also gagging   ? ?  ?  ? ?  ? ? Plan - 07/05/21 1454   ? ? Clinical Impression Statement Pt presents with normal oropharyngeal dysphagia with the exception of difficulty moving the barium tablet posteriorly in her oral cavity (with thin and with puree) and had to be expectorated, but Pt reports no difficulty with pills at home. Pt assessed with thin barium tsp/cup/straw, puree, and graham crackers. Pt with timely swallow trigger (at the valleculae) and adequate hyolaryngeal excursion. Pt with min reduced tongue base retraction with solids necessitating repeat/dry swallows to clear regular texture. Pt benefited from an "effortful/hard" swallow to facilitate vallecular clearance. Pt drank thin barium via straw at the end of the study and esophageal sweep completed and showed significant retrograde movement of barium in the esophagus and then backflow from the stomach to the esophagus and then Pt gagging. Unsure if Pt is experiencing esophageal dysmotility or nausea or combination. She is now under hospice care due to ovarian cancer and indicates poor appetite and metallic taste to everything she eats. Pt was encouraged to find some soups, ice cream, liquids she can tolerate (she indicated she did well with applesauce at home) and consume smaller, more frequent meals. She is clear for self regulated regular textures and all liquids from oropharyngeal standpoint. No further SLP services indicated at this time.   ? Consulted and Agree with Plan of Care Patient   ? ?  ?  ? ?  ? ? ?Patient will benefit from skilled therapeutic intervention in order to improve the following deficits and impairments:   ?Dysphagia, oropharyngeal  phase ? ? ? ? Recommendations/Treatment - 07/05/21 1442   ? ?  ? Swallow Evaluation Recommendations  ? SLP Diet Recommendations Thin;Age appropriate regular   ? Liquid Administration via Cup;Straw   ? Medication Administration Crushed with puree   as needed and able  ? Supervision Patient able to self feed   ? Postural Changes Seated upright at 90 degrees;Remain upright for at least 30 minutes after feeds/meals   ? ?  ?  ? ?  ? ? ? ? ?  Problem List ?Patient Active Problem List  ? Diagnosis Date Noted  ? Carpal tunnel syndrome on right 04/11/2021  ? Allergic reaction caused by a drug 04/11/2021  ? 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 Hepatic steatosis 03/03/2019  ? Pancreatic cyst 03/03/2019  ? BRCA gene mutation test positive 03/03/2019  ? Anemia due to antineoplastic chemotherapy 05/03/2018  ? Pancytopenia, acquired ([place]) 03/27/2018  ? Thrombocytopenia ([facility]) 02/06/2018  ? Goals of care, counseling/discussion 12/11/2017  ? Family history of colon cancer   ? Family history of skin cancer   ? Nausea with vomiting 10/17/2017  ? 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 Mediastinal adenopathy 08/03/2017  ? Type 2 diabetes mellitus treated without insulin ([place]) 06/03/2014  ? Essential hypertension 06/03/2014  ? ?Thank you, ? ?Genene Churn, [place] ?[phone] ? ?Demaryius Imran, CCC-SLP ?07/05/2021, 3:00 PM ? ?[facility] ?[address] ?[place], [place], [address] ?Phone: [phone]   Fax:  [phone] ? ?Name: JERELINE TICER ?MRN: [id number] ?Date of Birth: Dec 15, 1950 ? ?

## 2021-07-19 ENCOUNTER — Encounter: Payer: Self-pay | Admitting: Hematology and Oncology

## 2021-07-23 DEATH — deceased

## 2021-11-04 ENCOUNTER — Encounter: Payer: Self-pay | Admitting: Hematology and Oncology
